# Patient Record
Sex: Male | Born: 1945 | Race: White | Hispanic: No | Marital: Married | Smoking: Former smoker
Health system: Southern US, Community
[De-identification: ages and names within clinical notes are randomized; demographics above are authoritative.]

## PROBLEM LIST (undated history)

## (undated) DIAGNOSIS — F419 Anxiety disorder, unspecified: Secondary | ICD-10-CM

## (undated) DIAGNOSIS — E119 Type 2 diabetes mellitus without complications: Secondary | ICD-10-CM

## (undated) DIAGNOSIS — L570 Actinic keratosis: Secondary | ICD-10-CM

## (undated) DIAGNOSIS — H409 Unspecified glaucoma: Secondary | ICD-10-CM

## (undated) DIAGNOSIS — I10 Essential (primary) hypertension: Secondary | ICD-10-CM

## (undated) DIAGNOSIS — F40240 Claustrophobia: Secondary | ICD-10-CM

## (undated) DIAGNOSIS — H269 Unspecified cataract: Secondary | ICD-10-CM

## (undated) DIAGNOSIS — E7800 Pure hypercholesterolemia, unspecified: Secondary | ICD-10-CM

## (undated) DIAGNOSIS — K219 Gastro-esophageal reflux disease without esophagitis: Secondary | ICD-10-CM

## (undated) DIAGNOSIS — C801 Malignant (primary) neoplasm, unspecified: Secondary | ICD-10-CM

## (undated) HISTORY — DX: Unspecified glaucoma: H40.9

## (undated) HISTORY — PX: CHOLECYSTECTOMY: SHX55

## (undated) HISTORY — DX: Actinic keratosis: L57.0

## (undated) HISTORY — DX: Unspecified cataract: H26.9

## (undated) HISTORY — DX: Malignant (primary) neoplasm, unspecified: C80.1

---

## 2004-12-28 ENCOUNTER — Ambulatory Visit: Payer: Self-pay | Admitting: Gastroenterology

## 2013-06-05 ENCOUNTER — Emergency Department: Payer: Self-pay | Admitting: Emergency Medicine

## 2013-06-05 LAB — URINALYSIS, COMPLETE
Bilirubin,UR: NEGATIVE
Blood: NEGATIVE
Glucose,UR: >=500 mg/dL (ref 0–75)
Nitrite: NEGATIVE
Ph: 5 (ref 4.5–8.0)
Protein: NEGATIVE
RBC,UR: 1 /HPF (ref 0–5)
WBC UR: 2 /HPF (ref 0–5)

## 2013-06-05 LAB — COMPREHENSIVE METABOLIC PANEL
Albumin: 4.3 g/dL (ref 3.4–5.0)
Anion Gap: 4 — ABNORMAL LOW (ref 7–16)
BUN: 20 mg/dL — ABNORMAL HIGH (ref 7–18)
Bilirubin,Total: 0.5 mg/dL (ref 0.2–1.0)
Calcium, Total: 9.5 mg/dL (ref 8.5–10.1)
Osmolality: 286 (ref 275–301)
SGOT(AST): 23 U/L (ref 15–37)
SGPT (ALT): 33 U/L (ref 12–78)
Sodium: 135 mmol/L — ABNORMAL LOW (ref 136–145)
Total Protein: 7.9 g/dL (ref 6.4–8.2)

## 2013-06-05 LAB — CK TOTAL AND CKMB (NOT AT ARMC)
CK, Total: 78 U/L (ref 35–232)
CK-MB: 1.5 ng/mL (ref 0.5–3.6)

## 2013-06-05 LAB — CBC
HCT: 44.5 % (ref 40.0–52.0)
HGB: 15.4 g/dL (ref 13.0–18.0)
MCH: 30.3 pg (ref 26.0–34.0)
MCHC: 34.7 g/dL (ref 32.0–36.0)
MCV: 87 fL (ref 80–100)
Platelet: 254 10*3/uL (ref 150–440)
RBC: 5.09 10*6/uL (ref 4.40–5.90)
RDW: 13 % (ref 11.5–14.5)
WBC: 12.5 10*3/uL — ABNORMAL HIGH (ref 3.8–10.6)

## 2013-06-05 LAB — TROPONIN I: Troponin-I: <0.02 ng/mL

## 2013-07-06 ENCOUNTER — Telehealth (INDEPENDENT_AMBULATORY_CARE_PROVIDER_SITE_OTHER): Payer: Self-pay

## 2013-07-06 ENCOUNTER — Ambulatory Visit (INDEPENDENT_AMBULATORY_CARE_PROVIDER_SITE_OTHER): Payer: Self-pay | Admitting: General Surgery

## 2013-07-06 NOTE — Telephone Encounter (Signed)
Tried to call and see where the pt is.  His appointment was today at 11 am.  There was no answer on either phone.

## 2013-09-09 DIAGNOSIS — M25569 Pain in unspecified knee: Secondary | ICD-10-CM | POA: Insufficient documentation

## 2013-09-09 DIAGNOSIS — M1711 Unilateral primary osteoarthritis, right knee: Secondary | ICD-10-CM | POA: Insufficient documentation

## 2013-09-24 LAB — CBC
HCT: 47.9 % (ref 40.0–52.0)
HGB: 16.5 g/dL (ref 13.0–18.0)
Platelet: 221 10*3/uL (ref 150–440)
RDW: 13.1 % (ref 11.5–14.5)
WBC: 19.8 10*3/uL — ABNORMAL HIGH (ref 3.8–10.6)

## 2013-09-24 LAB — URINALYSIS, COMPLETE
Bacteria: NONE SEEN
Glucose,UR: >=500 mg/dL (ref 0–75)
Leukocyte Esterase: NEGATIVE
Nitrite: NEGATIVE
Ph: 7 (ref 4.5–8.0)
Protein: NEGATIVE
RBC,UR: 1 /HPF (ref 0–5)
Specific Gravity: 1.026 (ref 1.003–1.030)
WBC UR: <1 /HPF (ref 0–5)

## 2013-09-24 LAB — COMPREHENSIVE METABOLIC PANEL
Albumin: 4.7 g/dL (ref 3.4–5.0)
Alkaline Phosphatase: 87 U/L (ref 50–136)
Anion Gap: 7 (ref 7–16)
BUN: 12 mg/dL (ref 7–18)
Chloride: 101 mmol/L (ref 98–107)
SGPT (ALT): 32 U/L (ref 12–78)
Total Protein: 8.3 g/dL — ABNORMAL HIGH (ref 6.4–8.2)

## 2013-09-25 ENCOUNTER — Inpatient Hospital Stay: Payer: Self-pay | Admitting: Surgery

## 2013-09-25 LAB — HEPATIC FUNCTION PANEL A (ARMC)
Albumin: 3.5 g/dL (ref 3.4–5.0)
Bilirubin, Direct: 0.3 mg/dL — ABNORMAL HIGH (ref 0.00–0.20)
SGOT(AST): 29 U/L (ref 15–37)
SGPT (ALT): 30 U/L (ref 12–78)

## 2013-09-25 LAB — BASIC METABOLIC PANEL
Anion Gap: 6 — ABNORMAL LOW (ref 7–16)
Chloride: 102 mmol/L (ref 98–107)
Co2: 29 mmol/L (ref 21–32)
EGFR (Non-African Amer.): >60
Glucose: 178 mg/dL — ABNORMAL HIGH (ref 65–99)
Osmolality: 278 (ref 275–301)
Potassium: 3.3 mmol/L — ABNORMAL LOW (ref 3.5–5.1)

## 2013-09-25 LAB — CBC WITH DIFFERENTIAL/PLATELET
Eosinophil %: 0 %
HCT: 42.4 % (ref 40.0–52.0)
Lymphocyte #: 0.6 10*3/uL — ABNORMAL LOW (ref 1.0–3.6)
MCH: 30.5 pg (ref 26.0–34.0)
MCHC: 34.4 g/dL (ref 32.0–36.0)
Monocyte #: 1.2 x10 3/mm — ABNORMAL HIGH (ref 0.2–1.0)
Neutrophil #: 15.4 10*3/uL — ABNORMAL HIGH (ref 1.4–6.5)
Neutrophil %: 89 %
Platelet: 188 10*3/uL (ref 150–440)
RBC: 4.77 10*6/uL (ref 4.40–5.90)
RDW: 13.2 % (ref 11.5–14.5)

## 2013-09-26 LAB — CBC WITH DIFFERENTIAL/PLATELET
Basophil #: 0 10*3/uL (ref 0.0–0.1)
Basophil %: 0.2 %
Eosinophil %: 0.1 %
HCT: 40.3 % (ref 40.0–52.0)
HGB: 13.8 g/dL (ref 13.0–18.0)
Lymphocyte #: 0.6 10*3/uL — ABNORMAL LOW (ref 1.0–3.6)
MCH: 30.5 pg (ref 26.0–34.0)
MCHC: 34.3 g/dL (ref 32.0–36.0)
MCV: 89 fL (ref 80–100)
Monocyte %: 6.4 %
Neutrophil %: 89.9 %
Platelet: 164 10*3/uL (ref 150–440)
RBC: 4.53 10*6/uL (ref 4.40–5.90)
WBC: 17.2 10*3/uL — ABNORMAL HIGH (ref 3.8–10.6)

## 2013-09-27 LAB — CBC WITH DIFFERENTIAL/PLATELET
Basophil #: 0 10*3/uL (ref 0.0–0.1)
Basophil %: 0.3 %
Eosinophil #: 0 10*3/uL (ref 0.0–0.7)
Eosinophil %: 0 %
HCT: 39.2 % — ABNORMAL LOW (ref 40.0–52.0)
Lymphocyte %: 1.9 %
MCH: 30.4 pg (ref 26.0–34.0)
MCV: 89 fL (ref 80–100)
Monocyte %: 3.5 %
Neutrophil #: 14.1 10*3/uL — ABNORMAL HIGH (ref 1.4–6.5)
Neutrophil %: 94.3 %
Platelet: 186 10*3/uL (ref 150–440)
RDW: 13.2 % (ref 11.5–14.5)

## 2013-09-27 LAB — BASIC METABOLIC PANEL
Anion Gap: 4 — ABNORMAL LOW (ref 7–16)
BUN: 10 mg/dL (ref 7–18)
Co2: 27 mmol/L (ref 21–32)
EGFR (African American): >60
Osmolality: 277 (ref 275–301)
Sodium: 134 mmol/L — ABNORMAL LOW (ref 136–145)

## 2013-09-27 LAB — HEPATIC FUNCTION PANEL A (ARMC)
Albumin: 2.7 g/dL — ABNORMAL LOW (ref 3.4–5.0)
Alkaline Phosphatase: 83 U/L (ref 50–136)
Bilirubin, Direct: 0.3 mg/dL — ABNORMAL HIGH (ref 0.00–0.20)
SGOT(AST): 29 U/L (ref 15–37)
SGPT (ALT): 28 U/L (ref 12–78)
Total Protein: 6.6 g/dL (ref 6.4–8.2)

## 2013-09-28 LAB — COMPREHENSIVE METABOLIC PANEL
Albumin: 2.9 g/dL — ABNORMAL LOW (ref 3.4–5.0)
Alkaline Phosphatase: 85 U/L (ref 50–136)
Anion Gap: 0 — ABNORMAL LOW (ref 7–16)
BUN: 11 mg/dL (ref 7–18)
Bilirubin,Total: 0.6 mg/dL (ref 0.2–1.0)
Calcium, Total: 8.9 mg/dL (ref 8.5–10.1)
Co2: 31 mmol/L (ref 21–32)
Creatinine: 0.89 mg/dL (ref 0.60–1.30)
EGFR (African American): >60
EGFR (Non-African Amer.): >60
Glucose: 191 mg/dL — ABNORMAL HIGH (ref 65–99)
Potassium: 4.4 mmol/L (ref 3.5–5.1)
SGOT(AST): 17 U/L (ref 15–37)
SGPT (ALT): 26 U/L (ref 12–78)
Sodium: 132 mmol/L — ABNORMAL LOW (ref 136–145)
Total Protein: 7.1 g/dL (ref 6.4–8.2)

## 2013-09-28 LAB — CBC WITH DIFFERENTIAL/PLATELET
Basophil #: 0.1 10*3/uL (ref 0.0–0.1)
Eosinophil #: 0 10*3/uL (ref 0.0–0.7)
HCT: 37.5 % — ABNORMAL LOW (ref 40.0–52.0)
MCH: 31.2 pg (ref 26.0–34.0)
MCV: 90 fL (ref 80–100)
Neutrophil #: 14.6 10*3/uL — ABNORMAL HIGH (ref 1.4–6.5)
Neutrophil %: 90.1 %
Platelet: 291 10*3/uL (ref 150–440)
RBC: 4.18 10*6/uL — ABNORMAL LOW (ref 4.40–5.90)
RDW: 13.1 % (ref 11.5–14.5)
WBC: 16.3 10*3/uL — ABNORMAL HIGH (ref 3.8–10.6)

## 2013-09-29 LAB — COMPREHENSIVE METABOLIC PANEL
Bilirubin,Total: 0.7 mg/dL (ref 0.2–1.0)
Calcium, Total: 8.7 mg/dL (ref 8.5–10.1)
Chloride: 101 mmol/L (ref 98–107)
Co2: 29 mmol/L (ref 21–32)
Creatinine: 1.15 mg/dL (ref 0.60–1.30)
Glucose: 258 mg/dL — ABNORMAL HIGH (ref 65–99)
Osmolality: 279 (ref 275–301)
Potassium: 3.8 mmol/L (ref 3.5–5.1)
SGPT (ALT): 25 U/L (ref 12–78)
Sodium: 135 mmol/L — ABNORMAL LOW (ref 136–145)

## 2013-09-29 LAB — CULTURE, BLOOD (SINGLE)

## 2013-09-29 LAB — CBC WITH DIFFERENTIAL/PLATELET
Basophil #: 0 10*3/uL (ref 0.0–0.1)
Basophil %: 0.1 %
Eosinophil #: 0 10*3/uL (ref 0.0–0.7)
Eosinophil %: 0 %
Lymphocyte %: 3.3 %
MCH: 30.6 pg (ref 26.0–34.0)
MCHC: 34 g/dL (ref 32.0–36.0)
MCV: 90 fL (ref 80–100)
Monocyte %: 2.8 %
Neutrophil %: 93.8 %
Platelet: 272 10*3/uL (ref 150–440)
RDW: 13.1 % (ref 11.5–14.5)

## 2013-10-01 LAB — CBC WITH DIFFERENTIAL/PLATELET
Basophil #: 0.1 10*3/uL (ref 0.0–0.1)
Eosinophil #: 0.1 10*3/uL (ref 0.0–0.7)
Lymphocyte #: 1.6 10*3/uL (ref 1.0–3.6)
Lymphocyte %: 13.2 %
MCV: 89 fL (ref 80–100)
Monocyte %: 7.5 %
Neutrophil #: 9.8 10*3/uL — ABNORMAL HIGH (ref 1.4–6.5)
Neutrophil %: 78.3 %
Platelet: 302 10*3/uL (ref 150–440)
WBC: 12.5 10*3/uL — ABNORMAL HIGH (ref 3.8–10.6)

## 2013-10-01 LAB — HEPATIC FUNCTION PANEL A (ARMC)
Alkaline Phosphatase: 76 U/L (ref 50–136)
SGOT(AST): 19 U/L (ref 15–37)
SGPT (ALT): 29 U/L (ref 12–78)
Total Protein: 5.7 g/dL — ABNORMAL LOW (ref 6.4–8.2)

## 2013-12-22 ENCOUNTER — Ambulatory Visit: Payer: Self-pay | Admitting: Gastroenterology

## 2014-08-08 DIAGNOSIS — E785 Hyperlipidemia, unspecified: Secondary | ICD-10-CM | POA: Insufficient documentation

## 2014-08-08 DIAGNOSIS — E119 Type 2 diabetes mellitus without complications: Secondary | ICD-10-CM | POA: Insufficient documentation

## 2014-08-08 DIAGNOSIS — I10 Essential (primary) hypertension: Secondary | ICD-10-CM | POA: Insufficient documentation

## 2014-08-08 DIAGNOSIS — K219 Gastro-esophageal reflux disease without esophagitis: Secondary | ICD-10-CM | POA: Insufficient documentation

## 2014-11-15 IMAGING — CT CT ABD-PELV W/ CM
2 of 5 series · 16 of 46 positions shown, 18 images · IV contrast (isovue)
Comparison: 06/05/2013

CLINICAL DATA: Gallbladder pain since 1 a.m..

EXAM:
CT ABDOMEN AND PELVIS WITH CONTRAST
TECHNIQUE: Multidetector CT imaging of the abdomen and pelvis was performed
using the standard protocol following bolus administration of
intravenous contrast.
CONTRAST:  125 mL of Isovue 370

[Series 2: routine abd pel with · axial · 0.78mm/px · z∈[-966,-546]mm · 13 of 96 slices shown, 15 images]
[im 6/96  soft-tissue]
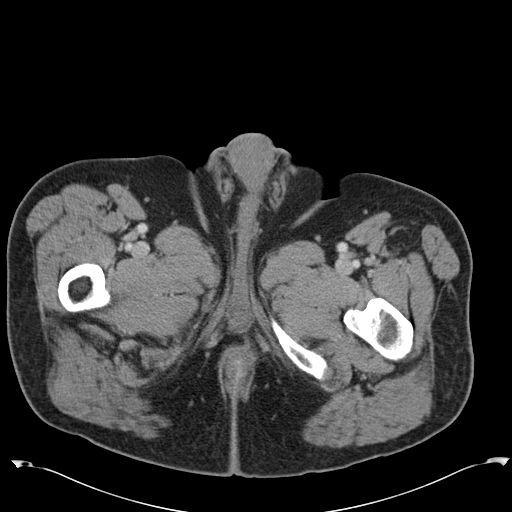
[im 6/96  bone]
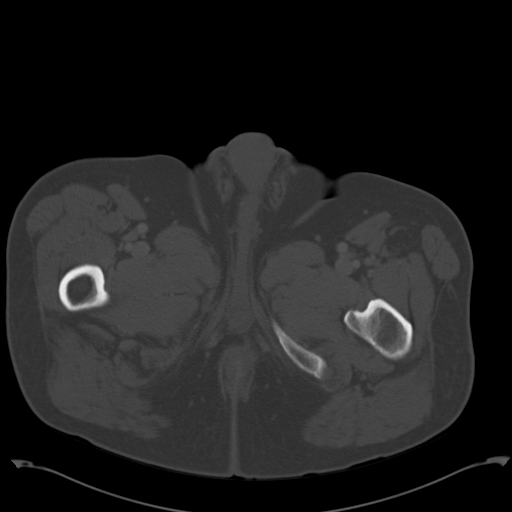
[im 11/96  soft-tissue]
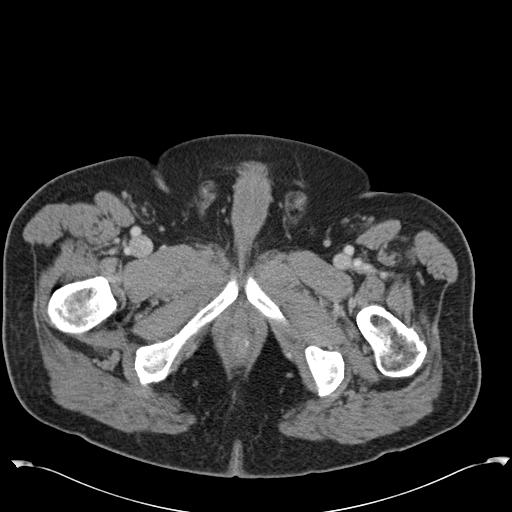
[im 22/96  soft-tissue]
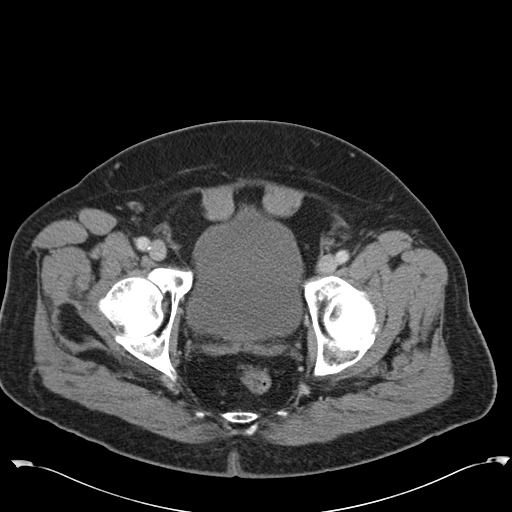
[im 27/96  soft-tissue]
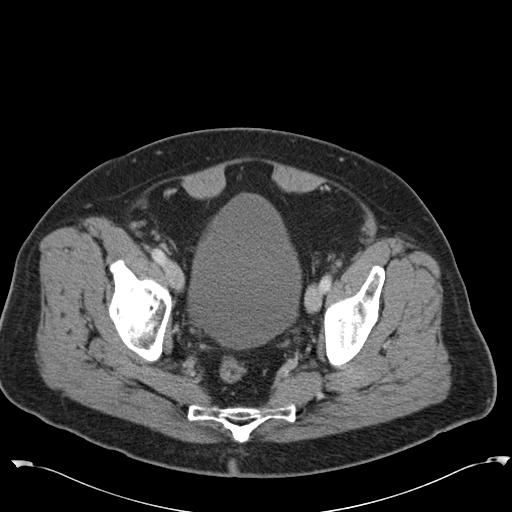
[im 32/96  soft-tissue]
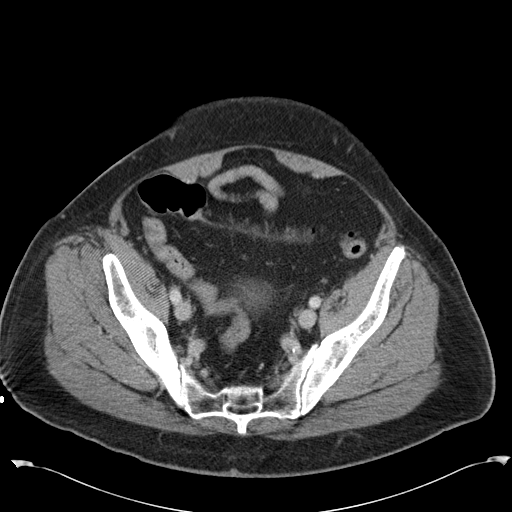
[im 43/96  soft-tissue]
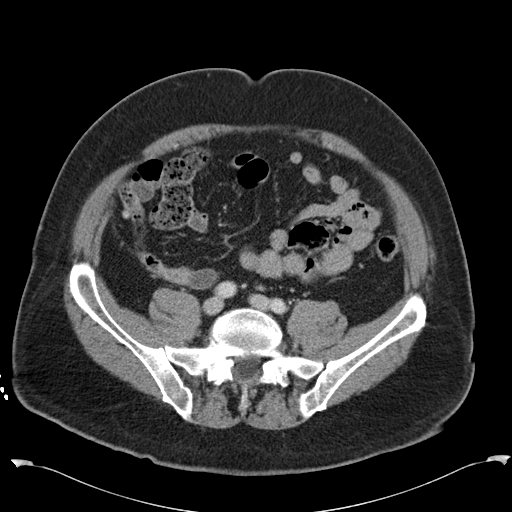
[im 48/96  soft-tissue]
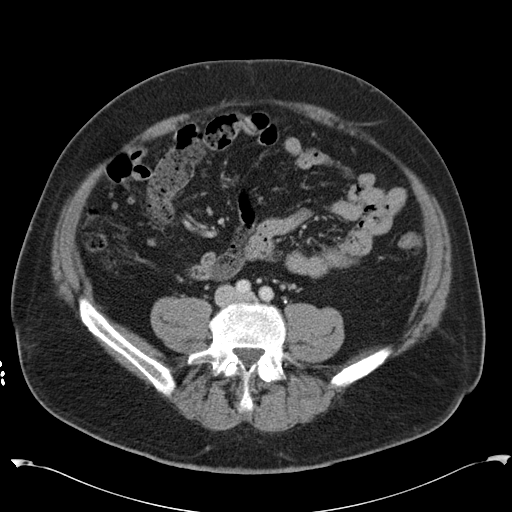
[im 53/96  soft-tissue]
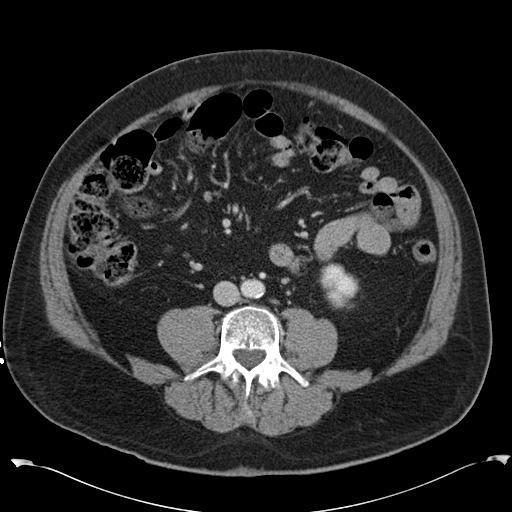
[im 64/96  soft-tissue]
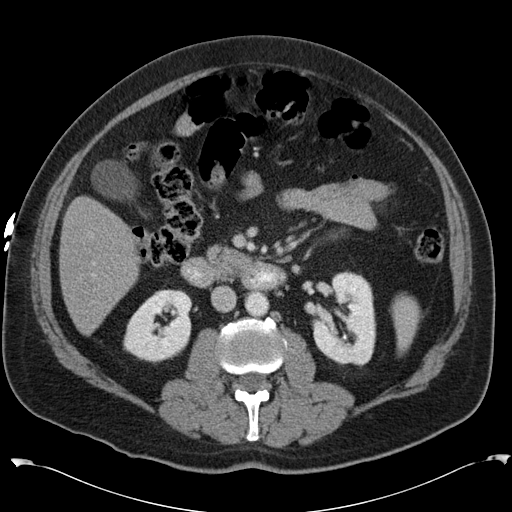
[im 64/96  bone]
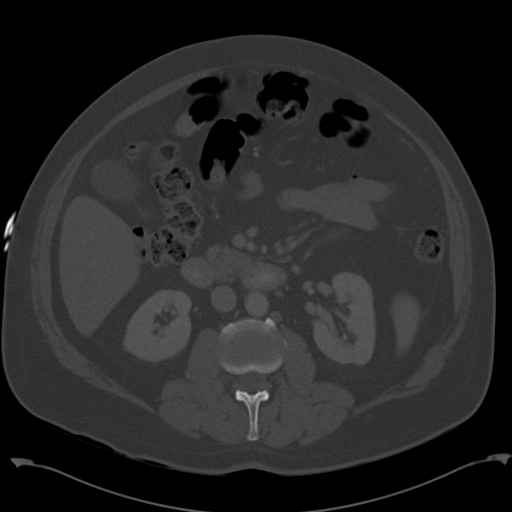
[im 69/96  soft-tissue]
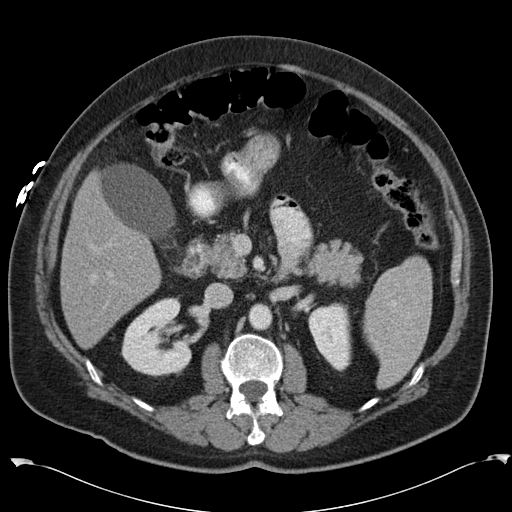
[im 74/96  soft-tissue]
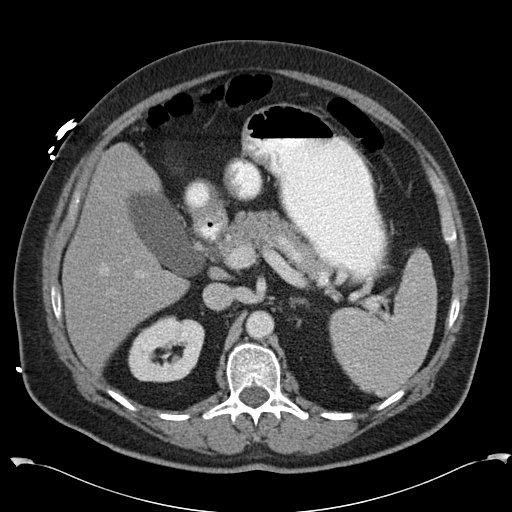
[im 85/96  soft-tissue]
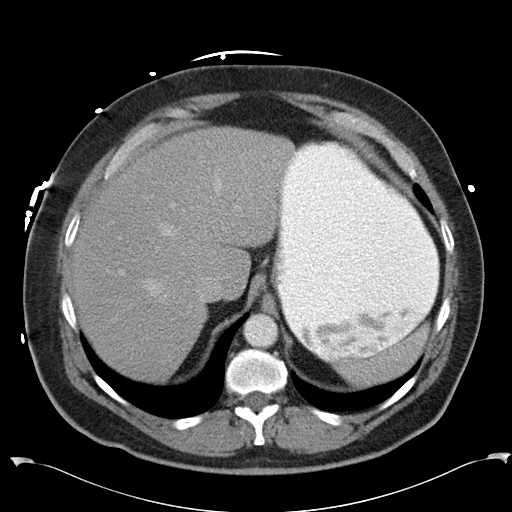
[im 90/96  soft-tissue]
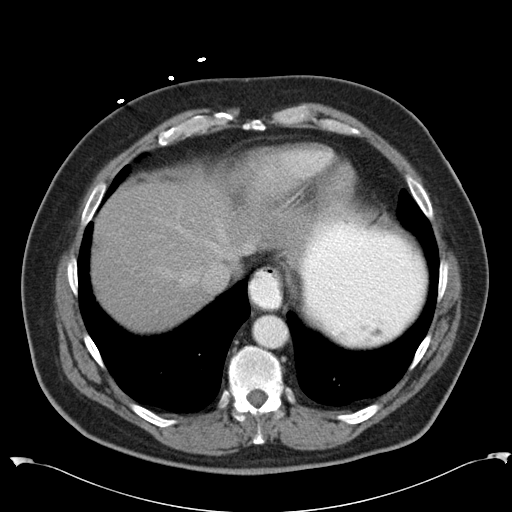

[Series 5: cor routine abd pel with · coronal · 0.73mm/px · 3 of 176 slices shown]
[im 59/176  soft-tissue]
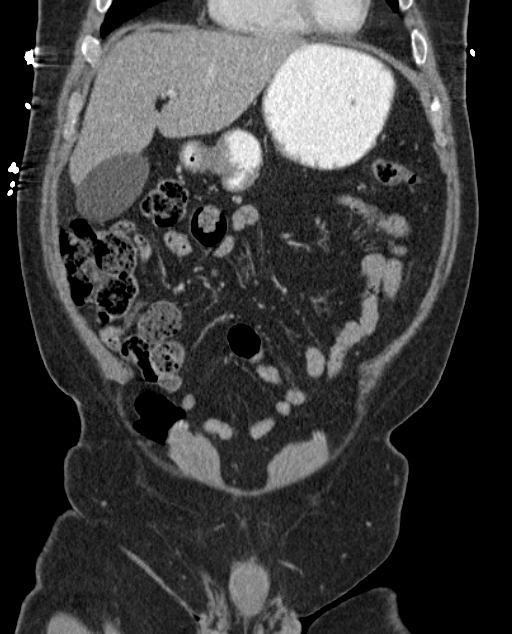
[im 78/176  soft-tissue]
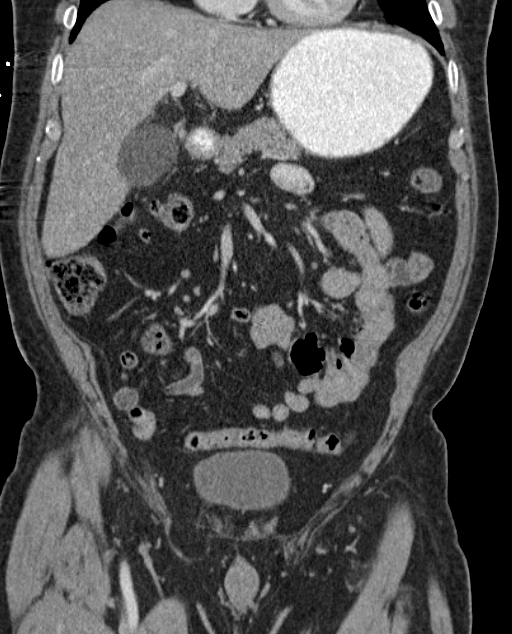
[im 98/176  soft-tissue]
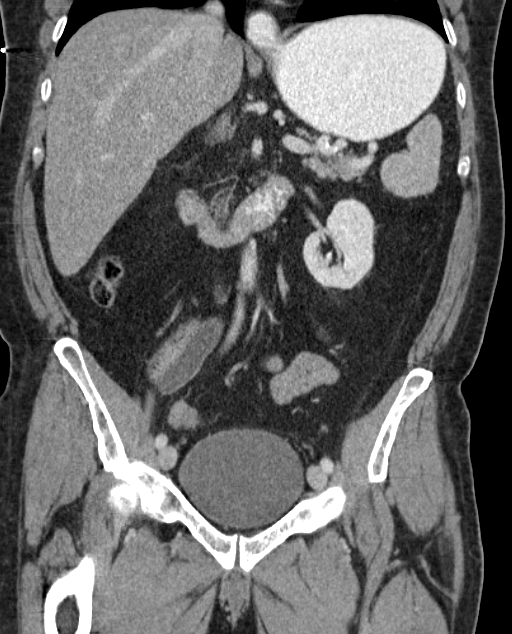

[16 of 46 positions shown; findings below may reference images not displayed]

FINDINGS: Visualized lung bases are clear. No pericardial effusion. Oral
contrast is visualized within a hiatal hernia and the distal
esophagus. Stomach is distended with fluid and oral contrast.

The liver, gallbladder,spleen, pancreas, adrenals, and kidneysare
unremarkable. No pericholecystic fluid or inflammatory changes.
Bladder is normal for the degree of distention. Prostate is
unchanged.

Opacified bowel loops are normal in caliber without evidence of
obstruction. In the left lower abdominal quadrant, there is a 2.6 x
3.1 cm collection of gas and soft tissue density. This is not
identified on the in May 2013 examination. No surrounding
inflammation is identified. This is in close proximity to the
jejunum. There is colonic diverticulosis without evidence of
diverticulitis.Normal appendix is visualized. <No ascites,
pneumoperitoneum, or lymphadenopathy is seen.

Aorta is normal in caliber.

No acute osseous abnormality or destructive osseous lesion.
IMPRESSION: 1. Unremarkable gallbladder without pericholecystic fluid or
inflammatory changes to suggest cholecystitis.
2. 2.6 x 3.1 cm collection of the gas and soft tissue density
adjacent to jejunum in the left lower quadrant. Given the lack of
surrounding inflammatory change, this likely represents a jejunal
diverticulum. However, this was not apparent on the prior
examination. Therefore, clinical correlation for pain in the left
lower quadrant is recommended to assess for possible contained bowel
perforation.
3. Colonic diverticulosis without evidence of diverticulitis.
4. Oral contrast within the distal esophagus, indicating reflux.
5. Hiatal hernia and distension of the stomach with oral contrast
and food material.
These results will be called to the ordering clinician or
representative by the Radiologist Assistant, and communication
documented in the PACS Dashboard.

## 2014-11-18 HISTORY — PX: COLONOSCOPY: SHX174

## 2015-01-23 ENCOUNTER — Ambulatory Visit: Payer: Self-pay | Admitting: Unknown Physician Specialty

## 2015-03-10 NOTE — Consult Note (Signed)
Brief Consult Note: Diagnosis: aspiraation pneumonia.   Patient was seen by consultant.   Consult note dictated.   Orders entered.   Comments: thanks for the opportunity to participate- will continue to follow.  Electronic Signatures: Vaughan Basta (MD)  (Signed 11-Nov-14 15:03)  Authored: Brief Consult Note   Last Updated: 11-Nov-14 15:03 by Vaughan Basta (MD)

## 2015-03-10 NOTE — Consult Note (Signed)
PATIENT NAME:  Sean Garner, Sean Garner MR#:  [id number] DATE OF BIRTH:  [birth year]  DATE OF CONSULTATION:  09/28/2013  REQUESTING PHYSICIAN: Dr. Sherri Rad  CONSULTING GASTROENTEROLOGIST: Dr. Lucilla Lame.  PRIMARY CARE PHYSICIAN:  [place].   REASON FOR CONSULTATION: Post cholecystectomy bile leak.   HISTORY OF PRESENT ILLNESS: Sean Garner is a 69 year old Caucasian male who was admitted with abdominal pain. 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 However, he continued to have problems, so he underwent a exploratory laparotomy on 09/26/2013 by Dr. Pat Patrick and was found to have acute gangrenous cholecystitis. He says his pain has been better postoperative. He is having some pain at the suture site which he rates 6/10 at worst. He denies any nausea or vomiting. He has not had a bowel movement in the past five days. He is passing flatus. He has lost 19 pounds in the last five months but states he was doing this intentionally due to hyperglycemia. He has had increased bilious output in his JP drain, 450 mL yesterday, 180 mL so far today.   PAST MEDICAL AND SURGICAL HISTORY: Hypercholesterolemia and diabetes mellitus, hypertension, diagnostic laparotomy with cholecystectomy for gangrenous cholecystitis, tonsillectomy.   MEDICATIONS PRIOR TO ADMISSION: Aspirin 81 mg daily, hydrochlorothiazide 25 mg daily. Vytorin 10/20 mg daily.   ALLERGIES: No known drug allergies.   FAMILY HISTORY: There is no known family history of colorectal carcinoma, liver or chronic GI problems.   SOCIAL HISTORY: He quit smoking in 1986 after a 25 pack-year history. He occasionally has a mixed drink once a week. He denies any illicit drug use. He is retired from Engineer, mining. He is married and has one Psychiatrist.   REVIEW OF SYSTEMS: See HPI, otherwise  negative 10 point review of systems.   PHYSICAL EXAMINATION: VITAL SIGNS: Temperature 98, pulse 71, respirations 20,  blood pressure 146/75, oxygen saturation 94%.  GENERAL: He is an obese Caucasian male who is alert, oriented, pleasant, cooperative, in no acute distress. He is accompanied by his wife.  HEENT: Sclerae clear, nonicteric. Conjunctivae pink. Oropharynx pink and moist without any lesions.  NECK: Supple without mass or thyromegaly.  CHEST: Heart regular rate and rhythm with normal S1, S2 without any murmurs, clicks, rubs or gallops.  LUNGS: Clear to auscultation bilaterally.  ABDOMEN: Protuberant. He has a JP drain with 10 mL of bilious drainage. He has dressings that are dry and intact.  ABDOMEN: Soft. He has faint bowel sounds. No rebound, tenderness or guarding.  EXTREMITIES: Without clubbing or edema.  SKIN: Pink, warm and dry without any rash or jaundice.  NEUROLOGIC: Grossly intact.  MUSCULOSKELETAL: Good equal movement and strength bilaterally.  PSYCHIATRIC: He is alert, cooperative, normal mood and affect.   LABORATORY STUDIES: Glucose 191, sodium 132, otherwise normal met-7. Lipase was 74 on 11/07. Albumin 2.9, otherwise normal LFTs. Troponin was negative on 11/07. White blood cell count of 16.3, hemoglobin 13, hematocrit 37.5, platelets 291. Urinalysis on 11/07 showed glucose greater than 500, ketones 1+.  IMPRESSION: Sean Garner is a very pleasant 69 year old Caucasian male who is two days status post laparotomy with cholecystectomy for gangrenous cholecystitis, who has had profuse, increased bilious drainage in his JP drain with suspected bile leak. He will need electroscopic retrograde cholangiopancreatography today with stent placement for bile leak with Dr. Allen Norris. I have discussed risks and benefits and alternatives, which include but are not limited to pancreatitis, bleeding, infection, perforation and drug reaction. He and his  wife agree with the plan. Consent will be obtained. He has been n.p.o.     PLAN: 1. ERCP today with stent placement with Dr. Allen Norris.  2. N.p.o.  3. Continue  supportive care.  We would like to thank you for allowing [place] to participate in the care of Sean Garner.  ____________________________ Andria Meuse, NP klj:sg D: 09/28/2013 10:00:23 ET T: 09/28/2013 10:47:58 ET JOB#: [id number]  cc: Andria Meuse, NP, <Dictator> [facility], [place] SIGNED 10/25/2013 15:28

## 2015-03-10 NOTE — Consult Note (Signed)
Chief Complaint:  Subjective/Chief Complaint Pt denies abdominal pain.  Denies N/V.  Appetite good. Another soft brown BMs in past 24 hrs. Pt saw scant red blood at surgical site last night.  no further bleeding.  JP output scant.   VITAL SIGNS/ANCILLARY NOTES: **Vital Signs.:   14-Nov-14 06:39  Vital Signs Type Routine  Celsius 36.8  Temperature Source oral  Pulse Pulse 66  Respirations Respirations 18  Systolic BP Systolic BP 370  Diastolic BP (mmHg) Diastolic BP (mmHg) 77  Mean BP 101  Pulse Ox % Pulse Ox % 92  Pulse Ox Activity Level  At rest  Oxygen Delivery Room Air/ 21 %  *Intake and Output.:   14-Nov-14 05:50  JP Drain ml     Out:  2.5    Shift 07:00  JP Drain ml     Out:  2.5    Daily 07:00  JP Drain ml     Out:  10   Brief Assessment:  GEN well developed, no acute distress, obese, Wife at bedside.   Cardiac Regular   Respiratory normal resp effort  clear BS  no use of accessory muscles   Gastrointestinal Normal   Gastrointestinal details normal Soft  Nontender  Nondistended  Dressings intact.  JP drain w/ 10cc bilous drainage   EXTR negative cyanosis/clubbing, negative edema   Additional Physical Exam Skin: pink, warm, dry   Lab Results: Hepatic:  14-Nov-14 05:08   Bilirubin, Total 0.6  Bilirubin, Direct 0.2 (Result(s) reported on 01 Oct 2013 at 05:37AM.)  Alkaline Phosphatase 76  SGPT (ALT) 29  SGOT (AST) 19  Total Protein, Serum  5.7  Albumin, Serum  2.4  Routine Hem:  14-Nov-14 05:08   WBC (CBC)  12.5  RBC (CBC)  4.02  Hemoglobin (CBC)  12.3  Hematocrit (CBC)  35.7  Platelet Count (CBC) 302  MCV 89  MCH 30.7  MCHC 34.5  RDW 13.1  Neutrophil % 78.3  Lymphocyte % 13.2  Monocyte % 7.5  Eosinophil % 0.6  Basophil % 0.4  Neutrophil #  9.8  Lymphocyte # 1.6  Monocyte # 0.9  Eosinophil # 0.1  Basophil # 0.1 (Result(s) reported on 01 Oct 2013 at 05:37AM.)   Assessment/Plan:  Assessment/Plan:  Assessment Post-cholecystectomy bile  leak s/p stent placement: Much improved.  JP output scant at this point.  No need for 2nd biliary stent at this point.  Bile Aspiration Pneumonitis: Resolved.  Appreciate Dr Zoila Shutter assistance   Plan 1) Continue supportive measures 2) resume diet & lovenox 3) monitor JP output 4) NPO after MN Will sign off, please call if you have any questions or concerns   Electronic Signatures: Andria Meuse (NP)  (Signed 14-Nov-14 09:00)  Authored: Chief Complaint, VITAL SIGNS/ANCILLARY NOTES, Brief Assessment, Lab Results, Assessment/Plan   Last Updated: 14-Nov-14 09:00 by Andria Meuse (NP)

## 2015-03-10 NOTE — Consult Note (Signed)
Chief Complaint:  Subjective/Chief Complaint Pt denies abdominal pain.  Denies N/V.  Appetite good.    No SOB.  2 soft browm BMs in past 24 hrs.   VITAL SIGNS/ANCILLARY NOTES: **Vital Signs.:   13-Nov-14 10:55  Temperature Temperature (F) 98.4  Celsius 36.8  Temperature Source oral  Pulse Pulse 75  Respirations Respirations 20  Systolic BP Systolic BP 902  Diastolic BP (mmHg) Diastolic BP (mmHg) 74  Mean BP 99  Pulse Ox % Pulse Ox % 92  Pulse Ox Activity Level  At rest  Oxygen Delivery Room Air/ 21 %   Brief Assessment:  GEN well developed, no acute distress, obese, Aunt at bedside.   Cardiac Regular   Respiratory normal resp effort  clear BS  no use of accessory muscles   Gastrointestinal Normal   Gastrointestinal details normal Soft  Nontender  Nondistended  Dressings intact.  JP drain w/ 10cc bilous drainage   EXTR negative cyanosis/clubbing, negative edema   Additional Physical Exam Skin: pink, warm, dry   Assessment/Plan:  Assessment/Plan:  Assessment Post-cholecystectomy bile leak s/p stent placement: Improving.  JP output decreasing but not completely diminished.  I discussed at great length this situation with Dr Allen Norris, Dr Marina Gravel, patient & patient's step-daughter.  Plan is to continue to monitor JP drainage today.  Pt may benefit from a 2nd biliary stent if leak still present tomorrow.  Pt will be made NPO after MN & lovenox placed on hold in anticipation.  I did discuss pt & family's concern about aspiration risks with sedation & explained that pt would need to be intubated for ERCP/stent if needed tomorrow.  I had a long discussion (15 minutes via telephone) with patient's step daughter & multiple questions were answered.  Family voiced cpncerns about leukocytosis & this was explained as pt's WBC is improving.  Explained it may remain elevated secondary to steroids or stress response as well.  Everyone is in agreement with plans & pt will be re-evaluated in the morning  or sooner if the need arises.   Bile Aspiration Pneumonitis: Resolving.  Appreciate Dr Zoila Shutter assistance   Plan 1) Advance diet as tolerated 2) continue supportive measures 3) monitor JP output 4) NPO after MN 5) lovenox on hold for possible procedure tomorrow 6) ERCP with secondary stent placement by Dr Allen Norris IF NEEDED tomorrow 7) CBC, LFTS in AM Please call if you have any questions or concerns   Electronic Signatures: Andria Meuse (NP)  (Signed [phone] 11:33)  Authored: Chief Complaint, VITAL SIGNS/ANCILLARY NOTES, Brief Assessment, Assessment/Plan   Last Updated: 13-Nov-14 11:33 by Andria Meuse (NP)

## 2015-03-10 NOTE — Op Note (Signed)
PATIENT NAME:  Sean Garner, Sean Garner MR#:  [id number] DATE OF BIRTH:  01-25-1946  DATE OF PROCEDURE:  09/26/2013  PREOPERATIVE DIAGNOSIS: Jejunal diverticulitis.   POSTOPERATIVE DIAGNOSIS: Acute gangrenous cholecystitis.   SURGERY: Exploratory laparotomy with cholecystectomy.   SURGEON: Micheline Maze, M.D.   ANESTHESIA: General.   OPERATIVE PROCEDURE: With the patient in the supine position after the induction of appropriate general anesthesia, the patient's abdomen was prepped with Betadine and draped with sterile towels. An alcohol wipe and Betadine impregnated Steri-Drape were utilized. A midline incision was made just around the umbilicus and carried down through the subcutaneous tissue with Bovie electrocautery. Midline fascia was identified and opened the length of the skin incision, as was the peritoneum. There was a small amount of what appeared to be bile-stained fluid noted. There was not any obvious purulence. The small bowel was identified and run from the ligament of Treitz to the ileocecal valve. There were multiple small jejunal diverticulum, but there did not appear to be any evidence of diverticulitis. The bowel was run from the ileocecal valve back to the ligament of Treitz, and again no abnormalities were identified. I palpated the pelvis, and he did have some diverticulosis but no evidence of any diverticulitis. On manual palpation, the upper abdomen revealed a distended, tense, inflamed gallbladder. The incision was extended superiorly through the subxiphoid area and carried down through the subcutaneous tissue using Bovie electrocautery. Midline fascia was identified and opened the length of the skin incision in this area. Multiple retractors were placed to provide better exposure. The gallbladder was acutely distended and gangrenous in areas. The dome was opened as I could not identify anything in the porta hepatis. Bile was all aspirated and was clear with evidence of complete biliary  obstruction. The gallbladder was grasped and taken down from the top using Bovie electrocautery. It was partially intrahepatic and using finger fracture technique, the gallbladder was separated from the bed in the liver. The cystic artery was identified and clipped. It was divided. Tedious dissection was required to expose what appeared to be a necrotic cystic duct. It was doubly clipped and divided. The gallbladder was lifted out of the abdominal cavity. The area was copiously suction irrigated. Attention was then turned to the liver bed which was bleeding profusely. Using manual pressure, Surgicel and Avitene, the bleeding was controlled. Total blood loss approximately 300 mL. The area was copiously irrigated. A drain was placed through a separate stab wound, placed in the bed of the liver using a 19 [place] Blake drain. It was secured with 3-0 nylon. Using a visceral retractor, the midline fascia was closed with running #1 looped PDS from each end, tying in the middle, burying the knot. Penrose drain was placed in the depths of the incision since there was such a contaminated field. The skin was then clipped using standard skin clips. Sterile dressings were applied. The patient was returned to the recovery room, having tolerated the procedure well. Sponge, instrument and needle counts were correct x 2 in the operating room.   ____________________________ Rodena Goldmann III, MD rle:gb D: 09/26/2013 21:46:32 ET T: 09/26/2013 22:13:25 ET JOB#: [id number]  cc: Rodena Goldmann III, MD, <Dictator> Rodena Goldmann MD ELECTRONICALLY SIGNED 10/07/2013 19:03

## 2015-03-10 NOTE — H&P (Signed)
   Subjective/Chief Complaint Abdominal pain   History of Present Illness Sean Garner is a pleasant 69 yo M who presents with 1 day of periumbilical and more left sided abdominal pain.  He says that it began acutely at 1 am today.  He initially thought that it was his gallbladder.  He says that he had similar pain in july.  This is much worse.  He had pericholecystic fluid at that time.  Does have subjective chills.  Nausea post CT.  Having regular BM.   Past History Hypercholesterolemia Hypertension   Past Med/Surgical Hx:  Hypercholesterolemia:   Hypertension:   ALLERGIES:  No Known Allergies:   Family and Social History:  Family History Negative   Social History negative tobacco, negative ETOH, negative Illicit drugs   Place of Living Home   Review of Systems:  Subjective/Chief Complaint Epigastric/Lsided pain   Fever/Chills No   Cough No   Sputum No   Abdominal Pain Yes   Diarrhea No   Constipation No   Nausea/Vomiting Yes   SOB/DOE No   Chest Pain No   Dysuria No   Tolerating PT Yes   Tolerating Diet Nauseated   Physical Exam:  GEN well developed, well nourished, no acute distress   HEENT pink conjunctivae, PERRL, hearing intact to voice   RESP normal resp effort  clear BS  no use of accessory muscles   CARD regular rate  no murmur  no thrills  No LE edema  no JVD  no Rub   ABD positive tenderness  denies Flank Tenderness  no hernia  soft  distended  normal BS   SKIN normal to palpation, No rashes, No ulcers, skin turgor good   NEURO cranial nerves intact, negative rigidity, negative tremor   PSYCH alert, A+O to time, place, person, good insight    Assessment/Admission Diagnosis Sean Garner is a pleasant 69 yo M with a history of epigastric pain who presents with acute onset epigastric and left abdominal pain.  + leukocytosis.  Not currently very tender.  CT scan concerned for perijejunal abscess.  Concern for small bowel diverticulitis vs meckels.    Plan Will discuss with Dr. Pat Patrick, who patient has requested, but will plan on exploratory laparotomy and probable small bowel resection.   Electronic Signatures: Floyde Parkins (MD)  (Signed [phone] 17:07)  Authored: CHIEF COMPLAINT and HISTORY, PAST MEDICAL/SURGIAL HISTORY, ALLERGIES, FAMILY AND SOCIAL HISTORY, REVIEW OF SYSTEMS, PHYSICAL EXAM, ASSESSMENT AND PLAN   Last Updated: 07-Nov-14 17:07 by Floyde Parkins (MD)

## 2015-03-10 NOTE — Discharge Summary (Signed)
PATIENT NAME:  Sean Garner, Sean Garner MR#:  [id number] DATE OF BIRTH:  1946-07-07  DATE OF ADMISSION:  09/25/2013 DATE OF DISCHARGE:  10/02/2013  FINAL DIAGNOSES: 1.  Acute gangrenous cholecystitis.  2.  Cystic duct leak.  3.  Jejunal diverticulosis.  4.  Hypertension.  5.  Hypercholesterolemia.   PRINCIPAL PROCEDURES:  1.  CT scan of the abdomen and pelvis.  2.  Open cholecystectomy.  3.  ERCP with stent placement.  4.  Gastroenterology evaluation.  5.  Pulmonary medicine evaluation.  6.  Internal medicine evaluation.  7.  Multiple chest x-rays.   HOSPITAL COURSE SUMMARY: The patient was admitted with abdominal pain, presumptive diagnosis of jejunal diverticulitis on the 7th of November. The patient's family requested Dr. Pat Patrick to evaluate him. Upon Dr. Rolin Barry evaluation, the patient had no pain, was feeling better. On hospital day #2, patient's white count remained elevated. His abdomen remained nontender. On hospital day #2, in the evening, the patient was found to have intermittent fevers. Intravenous antibiotics were continued. He had mild left lower quadrant tenderness. No rebound and no guarding. On the 9th, the patient had a temperature of 103 the previous evening. The patient was not feeling well. Abdomen was tender but soft. The patient requested Dr. Pat Patrick. He was taken to the Operating Room for laparotomy on the 9th of November in the evening. Acute gangrenous cholecystitis was performed through a midline incision and a drain was left in place. The patient did have gangrenous cholecystitis.   On postoperative day #1, the patient was found to have a bile leak. Gastroenterology was consulted. An ERCP was subsequently performed by Dr. Allen Norris on the same day. Cystic duct leak was encountered. During the procedure, the patient had what looked like an aspiration event. Postoperatively, he was seen by Dr. Mortimer Fries and medicine as well. He was placed on steroids and intravenous antibiotics. He was also placed on  breathing treatments. The patient really had no significant repercussions from his aspiration event and was able to be weaned off of his oxygen rather quickly. The patient continued to improve. JP output was still a little bilious but markedly diminished. His diet was able to advanced from clears to regular. He tolerated this well. On post ERCP day #2, patient still had some bile in his drain with a plan for the following day for a repeat ERCP. Drainage remained scant, ERCP was canceled. The patient continued to improve. He was placed on a prednisone taper. At this point, I switched him over to oral antibiotics of Levaquin. On the 15th, patient was very stable. Abdomen was soft, Penrose drain being removed. He was stable and improved for discharge, leaving the drain in place.  FOLLOWUP:  I will see him in my office next week for drain and staple removal.   DISCHARGE MEDICATIONS: Levaquin 500 mg by mouth once a day for 5 days, acetaminophen/hydrocodone 5/325 one tab every 6 hours as needed for pain, aspirin 81 mg by mouth once a day, Vytorin 10/20 mg tablet 1 tab every day, hydrochlorothiazide 25 mg by mouth once a day.   Call the office with any questions or concerns.    ____________________________ Jeannette How Marina Gravel, MD mab:cs D: 10/03/2013 17:09:18 ET T: 10/03/2013 20:09:24 ET JOB#: [id number]  cc: Elta [place] A. Marina Gravel, MD, <Dictator> Mili Piltz A Tyshana Nishida MD ELECTRONICALLY SIGNED 10/04/2013 21:27

## 2015-03-10 NOTE — Consult Note (Signed)
PATIENT NAME:  Sean Garner, Sean Garner MR#:  [id number] DATE OF BIRTH:  Sep 05, 1946  DATE OF CONSULTATION:  09/28/2013  REFERRING PHYSICIAN:  Marlyce Huge, MD CONSULTING PHYSICIAN:  Ceasar Lund. Anselm Jungling, MD  REASON FOR CONSULTATION: Pneumonia.  HISTORY OF PRESENT ILLNESS: The patient is a 69 year old male who was admitted to the hospital with pain, was found to have jejunal diverticulitis. Initially was on medical management, but on 9th of November exploratory laparotomy was done and found to have gangrenous cholecystitis so cholecystectomy was done. Also had large bilious vomit during this period and oropharynx was suctioned aggressively and after that chest x-ray shows left lower lobe opacity. Now the patient is alert, some respiratory distress, but no fever, nausea or vomiting, and denies any complaints. Hospitalist consult is called in for aspiration pneumonia management. Complains of some wheezing and feeling congestion in his chest, by patient.   REVIEW OF SYSTEMS: CONSTITUTIONAL: Negative for fever, fatigue, weakness or weight loss.  EYES: No blurring, double vision, discharge or redness.  EARS, NOSE, THROAT: No tinnitus, ear pain or hearing loss.  RESPIRATORY: Has some wheezing and congestion feeling and mild shortness of breath.  CARDIOVASCULAR: No chest pain, orthopnea, palpitations or edema.  GASTROINTESTINAL: No nausea, vomiting, diarrhea or abdominal pain.  GENITOURINARY: No dysuria, hematuria or increased frequency.  GASTROINTESTINAL: Denies any nausea, vomiting or diarrhea. Mild pain in the site of surgery.  SKIN: No rashes.  LEGS: No swelling or pain. NEUROLOGIC: Denies any weakness, numbness, tremors or vertigo.  PSYCHIATRIC: No bipolar disorder or depression.   PAST MEDICAL HISTORY:  1.  Hypercholesterolemia.  2.  Diabetes.  3.  Hypertension.  4.  Diagnostic laparotomy and cholecystectomy with gangrenous cholecystitis.   FAMILY HISTORY: Breast cancer in mother. Diabetes  in father and grandfather.   SOCIAL HISTORY: Quit smoking in 1986, smoked for 25 years. Drinks alcohol very rarely. Retired from [employer].   HOME MEDICATIONS PRIOR TO ADMISSION: 1.  Aspirin 81 mg. 2.  Hydrochlorothiazide 25 mg daily. 3.  Vytorin 10/20 mg daily.  CURRENT MEDICATIONS: 1.  PPI IV b.i.d.  2.  Zosyn IV every 8 hours. 3.  Lovenox for DVT prophylaxis. 4.  Insulin for sliding scale coverage.  PHYSICAL EXAMINATION: VITAL SIGNS: In the ER, temperature 97.7, pulse rate 84, respirations 20 and blood pressure 166/89. Pulse ox is 92 on 2 liters oxygen supplementation.  GENERAL: The patient is fully alert and oriented to time, place and person, in no acute distress.  HEENT: Head and neck atraumatic. Conjunctivae pink. Oral mucosa moist.  NECK: Supple. No JVD.  LUNGS: Bilateral equal air entry. Few wheezing and crepitation heard. Using nasal cannula oxygen.  HEART: S1 and S2 present, regular. No murmur.  ABDOMEN: Tenderness present at the surgical site. Sutures and dressing from surgery present. There is drainage tube in gallbladder area present. Bowel sounds present.  SKIN: No rashes.  LEGS: No edema.  NEUROLOGICAL: Moves all 4 limbs. Power 5/5. No tremors or rigidity.  PSYCHIATRIC: Does not appear in any acute psychiatric illness at this time.   IMPORTANT LABORATORY AND DIAGNOSTICS: Glucose 191, BUN 11, creatinine 0.89, sodium 132, potassium 4.4, chloride 101, CO2 31, total protein 7.1, albumin 2.9, bilirubin 0.6. WBC 16.3, hemoglobin 13 and platelet count 291.   Chest x-ray, portable, shows pulmonary vascular congestion without overt edema. Peribronchial cuffing. Left basilar opacity, favor atelectasis.   ASSESSMENT AND PLAN: A 69 year old male with remote smoking history admitted with gallbladder surgery status post cholecystectomy and ERCP, post operative distress and aspiration  with chronic obstructive pulmonary disease exacerbation.  1.  Acute respiratory failure due  to chronic obstructive pulmonary disease exacerbation. Will continue on oxygen as needed and Dr. Mortimer Fries from pulmonary already saw the patient, suggested IV steroid, Pulmicort and DuoNeb, so we will continue this treatment.  2.  Aspiration pneumonitis. The patient is already on Zosyn by surgical team. This is for gallbladder and that will be also effective for his pneumonia. Will continue the same.  3.  Diabetes. He is currently n.p.o. and on insulin sliding scale coverage. We will continue the same.  4.  Hypertension. Currently blood pressure is under control so hold the medication.  5.  For cholecystitis, surgery and gastroenterology is following. Continue management as per them.  6.  Deep vein thrombosis prophylaxis. As per surgery, Lovenox.   Thanks for letting us participate in this patient's management. Will continue following.  TOTAL TIME SPENT ON THIS CONSULT: 50 minutes.  ____________________________ Ceasar Lund Anselm Jungling, MD vgv:sb D: 09/28/2013 15:22:31 ET T: 09/28/2013 15:38:31 ET JOB#: [id number]  cc: Ceasar Lund. Anselm Jungling, MD, <Dictator> Vaughan Basta MD ELECTRONICALLY SIGNED 10/18/2013 10:09

## 2015-03-10 NOTE — Consult Note (Signed)
Chief Complaint:  Subjective/Chief Complaint Pt denies abdominal pain.  Denies N/V.  Appetite improving.  No SOB now.   VITAL SIGNS/ANCILLARY NOTES: **Vital Signs.:   12-Nov-14 09:45  Temperature Temperature (F) 98.4  Celsius 36.8  Temperature Source oral  Pulse Pulse 77  Respirations Respirations 17  Systolic BP Systolic BP 920  Diastolic BP (mmHg) Diastolic BP (mmHg) 75  Mean BP 105  Pulse Ox % Pulse Ox % 90  Pulse Ox Activity Level  At rest  Oxygen Delivery Room Air/ 21 %   Brief Assessment:  GEN well developed, no acute distress, obese, Friend at bedside.   Cardiac Regular   Respiratory normal resp effort  clear BS  no use of accessory muscles   Gastrointestinal Normal   Gastrointestinal details normal Soft  Nontender  Nondistended  Dressings intact.  JP drain w/ 10cc bilous drainage   EXTR negative cyanosis/clubbing, negative edema   Additional Physical Exam Skin: pink, warm, dry   Lab Results:  Hepatic:  12-Nov-14 05:34   Bilirubin, Total 0.7  Alkaline Phosphatase 83  SGPT (ALT) 25  SGOT (AST) 18  Total Protein, Serum 6.6  Albumin, Serum  2.6  Routine Chem:  12-Nov-14 05:34   Glucose, Serum  258  BUN 12  Sodium, Serum  135  Potassium, Serum 3.8  Chloride, Serum 101  CO2, Serum 29  Calcium (Total), Serum 8.7  Osmolality (calc) 279  eGFR (African American) >60  eGFR (Non-African American) >60 (eGFR values <90m/min/1.73 m2 may be an indication of chronic kidney disease (CKD). Calculated eGFR is useful in patients with stable renal function. The eGFR calculation will not be reliable in acutely ill patients when serum creatinine is changing rapidly. It is not useful in  patients on dialysis. The eGFR calculation may not be applicable to patients at the low and high extremes of body sizes, pregnant women, and vegetarians.)  Anion Gap  5  Routine Hem:  12-Nov-14 05:34   WBC (CBC)  13.3  RBC (CBC)  4.02  Hemoglobin (CBC)  12.3  Hematocrit (CBC)   36.2  Platelet Count (CBC) 272  MCV 90  MCH 30.6  MCHC 34.0  RDW 13.1  Neutrophil % 93.8  Lymphocyte % 3.3  Monocyte % 2.8  Eosinophil % 0.0  Basophil % 0.1  Neutrophil #  12.5  Lymphocyte #  0.4  Monocyte # 0.4  Eosinophil # 0.0  Basophil # 0.0 (Result(s) reported on 29 Sep 2013 at 06:26AM.)   Assessment/Plan:  Assessment/Plan:  Assessment Post-cholecystectomy bile leak s/p stent placement: Improving.  JP output decreasing. Bile Aspiration Pneumonitis: Appreciate Dr KZoila Shutterassistance   Plan 1) Advance to clear liquid diet as tolerated 2) continue supportive measures Please call if you have any questions or concerns   Electronic Signatures: JAndria Meuse(NP)  (Signed 12-Nov-14 10:07)  Authored: Chief Complaint, VITAL SIGNS/ANCILLARY NOTES, Brief Assessment, Lab Results, Assessment/Plan   Last Updated: 12-Nov-14 10:07 by JAndria Meuse(NP)

## 2015-03-10 NOTE — Consult Note (Signed)
Brief Consult Note: Diagnosis: Post-cholecystectomy bile leak.   Patient was seen by consultant.   Consult note dictated.   Comments: Mr. Quest is a very pleasant 69 y/o caucasian male who is 2 days s/p cholecystectomy for gangrenous cholecystitis who has had profuse increased bilious drainage with suspected bile leak.  He will need ERCP today with stent placement for bile leak with Dr Allen Norris.  I have discussed risks, benefits, alternatives including but not limited to pancreatitis, bleeding, infection, perforation & drug reaction.  He & wife agree with plan.  He has been NPO.  Plan: 1) ERCP with stent today 2) NPO 3) Continue supportive care  Thanks for consult.  Please see full dictated note. #[id number].  Electronic Signatures: Andria Meuse (NP)  (Signed 11-Nov-14 10:01)  Authored: Brief Consult Note   Last Updated: 11-Nov-14 10:01 by Andria Meuse (NP)

## 2015-03-13 LAB — SURGICAL PATHOLOGY

## 2017-07-30 NOTE — Discharge Instructions (Signed)

## 2017-11-26 ENCOUNTER — Encounter: Payer: Self-pay | Admitting: *Deleted

## 2017-11-26 ENCOUNTER — Other Ambulatory Visit: Payer: Self-pay

## 2017-12-03 ENCOUNTER — Ambulatory Visit: Payer: Medicare Other | Admitting: Anesthesiology

## 2017-12-03 ENCOUNTER — Encounter
Admission: RE | Disposition: A | Discharge status: Home / Self Care | Payer: Self-pay | Source: Ambulatory Visit | Attending: Ophthalmology

## 2017-12-03 ENCOUNTER — Ambulatory Visit
Admission: RE | Admit: 2017-12-03 | Discharge: 2017-12-03 | Disposition: A | Discharge status: Home / Self Care | Payer: Medicare Other | Source: Ambulatory Visit | Attending: Ophthalmology | Admitting: Ophthalmology

## 2017-12-03 DIAGNOSIS — I10 Essential (primary) hypertension: Secondary | ICD-10-CM | POA: Insufficient documentation

## 2017-12-03 DIAGNOSIS — F419 Anxiety disorder, unspecified: Secondary | ICD-10-CM | POA: Insufficient documentation

## 2017-12-03 DIAGNOSIS — H2512 Age-related nuclear cataract, left eye: Secondary | ICD-10-CM | POA: Diagnosis not present

## 2017-12-03 DIAGNOSIS — E7800 Pure hypercholesterolemia, unspecified: Secondary | ICD-10-CM | POA: Insufficient documentation

## 2017-12-03 DIAGNOSIS — H5703 Miosis: Secondary | ICD-10-CM | POA: Insufficient documentation

## 2017-12-03 DIAGNOSIS — E119 Type 2 diabetes mellitus without complications: Secondary | ICD-10-CM | POA: Diagnosis not present

## 2017-12-03 DIAGNOSIS — Z7984 Long term (current) use of oral hypoglycemic drugs: Secondary | ICD-10-CM | POA: Diagnosis not present

## 2017-12-03 DIAGNOSIS — Z79899 Other long term (current) drug therapy: Secondary | ICD-10-CM | POA: Diagnosis not present

## 2017-12-03 DIAGNOSIS — Z87891 Personal history of nicotine dependence: Secondary | ICD-10-CM | POA: Diagnosis not present

## 2017-12-03 HISTORY — DX: Essential (primary) hypertension: I10

## 2017-12-03 HISTORY — DX: Type 2 diabetes mellitus without complications: E11.9

## 2017-12-03 HISTORY — DX: Pure hypercholesterolemia, unspecified: E78.00

## 2017-12-03 HISTORY — DX: Anxiety disorder, unspecified: F41.9

## 2017-12-03 HISTORY — DX: Gastro-esophageal reflux disease without esophagitis: K21.9

## 2017-12-03 HISTORY — PX: CATARACT EXTRACTION W/PHACO: SHX586

## 2017-12-03 HISTORY — DX: Claustrophobia: F40.240

## 2017-12-03 LAB — GLUCOSE, CAPILLARY
GLUCOSE-CAPILLARY: 225 mg/dL — AB (ref 65–99)
GLUCOSE-CAPILLARY: 213 mg/dL — AB (ref 65–99)

## 2017-12-03 SURGERY — PHACOEMULSIFICATION, CATARACT, WITH IOL INSERTION
Anesthesia: Monitor Anesthesia Care | Site: Eye | Laterality: Left | Wound class: Clean

## 2017-12-03 MED ORDER — LIDOCAINE HCL (PF) 2 % IJ SOLN
INTRAOCULAR | Status: DC | PRN
  Administered 2017-12-03: 1 mL via INTRAMUSCULAR

## 2017-12-03 MED ORDER — BRIMONIDINE TARTRATE-TIMOLOL 0.2-0.5 % OP SOLN
OPHTHALMIC | Status: DC | PRN
  Administered 2017-12-03: 1 [drp] via OPHTHALMIC

## 2017-12-03 MED ORDER — LACTATED RINGERS IV SOLN: 10.0000 mL/h | INTRAVENOUS | Status: DC

## 2017-12-03 MED ORDER — FENTANYL CITRATE (PF) 100 MCG/2ML IJ SOLN
INTRAMUSCULAR | Status: DC | PRN
  Administered 2017-12-03: 50 ug via INTRAVENOUS

## 2017-12-03 MED ORDER — EPINEPHRINE PF 1 MG/ML IJ SOLN
INTRAOCULAR | Status: DC | PRN
  Administered 2017-12-03: 74 mL via OPHTHALMIC

## 2017-12-03 MED ORDER — MIDAZOLAM HCL 2 MG/2ML IJ SOLN
INTRAMUSCULAR | Status: DC | PRN
  Administered 2017-12-03: 2 mg via INTRAVENOUS

## 2017-12-03 MED ORDER — ARMC OPHTHALMIC DILATING DROPS
1.0000 "application " | OPHTHALMIC | Status: DC | PRN
  Administered 2017-12-03 (×3): 1 via OPHTHALMIC

## 2017-12-03 MED ORDER — MOXIFLOXACIN HCL 0.5 % OP SOLN
1.0000 [drp] | OPHTHALMIC | Status: DC | PRN
  Administered 2017-12-03 (×3): 1 [drp] via OPHTHALMIC

## 2017-12-03 MED ORDER — CEFUROXIME OPHTHALMIC INJECTION 1 MG/0.1 ML
INJECTION | OPHTHALMIC | Status: DC | PRN
  Administered 2017-12-03: 0.1 mL via INTRACAMERAL

## 2017-12-03 MED ORDER — NA HYALUR & NA CHOND-NA HYALUR 0.4-0.35 ML IO KIT
PACK | INTRAOCULAR | Status: DC | PRN
  Administered 2017-12-03: 1 mL via INTRAOCULAR

## 2017-12-03 SURGICAL SUPPLY — 25 items
CANNULA ANT/CHMB 27GA (MISCELLANEOUS) ×3 IMPLANT
CARTRIDGE ABBOTT (MISCELLANEOUS) IMPLANT
GLOVE SURG LX 7.5 STRW (GLOVE) ×2
GLOVE SURG LX STRL 7.5 STRW (GLOVE) ×1 IMPLANT
GLOVE SURG TRIUMPH 8.0 PF LTX (GLOVE) ×3 IMPLANT
GOWN STRL REUS W/ TWL LRG LVL3 (GOWN DISPOSABLE) ×2 IMPLANT
GOWN STRL REUS W/TWL LRG LVL3 (GOWN DISPOSABLE) ×4
LENS IOL TECNIS ITEC 19.5 (Intraocular Lens) ×3 IMPLANT
MARKER SKIN DUAL TIP RULER LAB (MISCELLANEOUS) ×3 IMPLANT
NDL RETROBULBAR .5 NSTRL (NEEDLE) IMPLANT
NEEDLE FILTER BLUNT 18X 1/2SAF (NEEDLE) ×2
NEEDLE FILTER BLUNT 18X1 1/2 (NEEDLE) ×1 IMPLANT
PACK CATARACT BRASINGTON (MISCELLANEOUS) ×3 IMPLANT
PACK EYE AFTER SURG (MISCELLANEOUS) ×3 IMPLANT
PACK OPTHALMIC (MISCELLANEOUS) ×3 IMPLANT
RING MALYGIN 7.0 (MISCELLANEOUS) ×3 IMPLANT
SUT ETHILON 10-0 CS-B-6CS-B-6 (SUTURE)
SUT VICRYL  9 0 (SUTURE)
SUT VICRYL 9 0 (SUTURE) IMPLANT
SUTURE EHLN 10-0 CS-B-6CS-B-6 (SUTURE) IMPLANT
SYR 3ML LL SCALE MARK (SYRINGE) ×3 IMPLANT
SYR 5ML LL (SYRINGE) ×3 IMPLANT
SYR TB 1ML LUER SLIP (SYRINGE) ×3 IMPLANT
WATER STERILE IRR 250ML POUR (IV SOLUTION) ×3 IMPLANT
WIPE NON LINTING 3.25X3.25 (MISCELLANEOUS) ×3 IMPLANT

## 2017-12-03 NOTE — Transfer of Care (Signed)
Immediate Anesthesia Transfer of Care Note  Patient: Sean Garner.  Procedure(s) Performed: CATARACT EXTRACTION PHACO AND INTRAOCULAR LENS PLACEMENT (IOC) COMPLICATED DIABETIC LEFT (Left Eye)  Patient Location: PACU  Anesthesia Type: MAC  Level of Consciousness: awake, alert  and patient cooperative  Airway and Oxygen Therapy: Patient Spontanous Breathing and Patient connected to supplemental oxygen  Post-op Assessment: Post-op Vital signs reviewed, Patient's Cardiovascular Status Stable, Respiratory Function Stable, Patent Airway and No signs of Nausea or vomiting  Post-op Vital Signs: Reviewed and stable  Complications: No apparent anesthesia complications

## 2017-12-03 NOTE — Op Note (Signed)
OPERATIVE NOTE  Sohail Capraro [id number] 12/03/2017  PREOPERATIVE DIAGNOSIS:   Nuclear sclerotic cataract left eye with miotic pupil      H25.12   POSTOPERATIVE DIAGNOSIS:   Nuclear sclerotic cataract left eye with miotic pupil.     PROCEDURE:  Phacoemulsification with posterior chamber intraocular lens implantation of the left eye which required pupil stretching with the Malyugin pupil expansion device   LENS:   Implant Name Type Inv. Item Serial No. Manufacturer Lot No. LRB No. Used  LENS IOL DIOP 19.5 - [id number] Intraocular Lens LENS IOL DIOP 19.5 [id number]  Left 1        ULTRASOUND TIME: 24 % of 1 minutes, 38 seconds.  CDE 23.2   SURGEON:  Wyonia Hough, MD   ANESTHESIA: Topical with tetracaine drops and 2% Xylocaine jelly, augmented with 1% preservative-free intracameral lidocaine.   COMPLICATIONS:  None.   DESCRIPTION OF PROCEDURE:  The patient was identified in the holding room and transported to the operating room and placed in the supine position under the operating microscope.  The left eye was identified as the operative eye and it was prepped and draped in the usual sterile ophthalmic fashion.   A 1 millimeter clear-corneal paracentesis was made at the 1:30 position.  The anterior chamber was filled with Viscoat viscoelastic.  0.5 ml of preservative-free 1% lidocaine was injected into the anterior chamber.  A 2.4 millimeter keratome was used to make a near-clear corneal incision at the 10:30 position.  A Malyugin pupil expander was then placed through the main incision and into the anterior chamber of the eye.  The edge of the iris was secured on the lip of the pupil expander and it was released, thereby expanding the pupil to approximately 7 millimeters for completion of the cataract surgery.  Additional Viscoat was placed in the anterior chamber.  A cystotome and capsulorrhexis forceps were used to make a curvilinear capsulorrhexis.   Balanced salt solution  was used to hydrodissect and hydrodelineate the lens nucleus.   Phacoemulsification was used in stop and chop fashion to remove the lens, nucleus and epinucleus.  The remaining cortex was aspirated using the irrigation aspiration handpiece.  Additional Provisc was placed into the eye to distend the capsular bag for lens placement.  A lens was then injected into the capsular bag.  The pupil expanding ring was removed using a Kuglen hook and insertion device. The remaining viscoelastic was aspirated from the capsular bag and the anterior chamber.  The anterior chamber was filled with balanced salt solution to inflate to a physiologic pressure.   Wounds were hydrated with balanced salt solution.  The anterior chamber was inflated to a physiologic pressure with balanced salt solution.  No wound leaks were noted. Cefuroxime 0.1 ml of a 10mg /ml solution was injected into the anterior chamber for a dose of 1 mg of intracameral antibiotic at the completion of the case.   Timolol and Brimonidine drops were applied to the eye.  The patient was taken to the recovery room in stable condition without complications of anesthesia or surgery.  Tristain Daily 12/03/2017, 8:05 AM

## 2017-12-03 NOTE — Anesthesia Postprocedure Evaluation (Signed)
Anesthesia Post Note  Patient: Sean Garner.  Procedure(s) Performed: CATARACT EXTRACTION PHACO AND INTRAOCULAR LENS PLACEMENT (IOC) COMPLICATED DIABETIC LEFT (Left Eye)  Patient location during evaluation: PACU Anesthesia Type: MAC Level of consciousness: awake and alert, oriented and patient cooperative Pain management: pain level controlled Vital Signs Assessment: post-procedure vital signs reviewed and stable Respiratory status: spontaneous breathing, nonlabored ventilation and respiratory function stable Cardiovascular status: blood pressure returned to baseline and stable Postop Assessment: adequate PO intake Anesthetic complications: no    Darrin Nipper

## 2017-12-03 NOTE — Anesthesia Procedure Notes (Signed)
Procedure Name: MAC Performed by: Reeves Musick, CRNA Pre-anesthesia Checklist: Patient identified, Emergency Drugs available, Suction available, Timeout performed and Patient being monitored Patient Re-evaluated:Patient Re-evaluated prior to induction Oxygen Delivery Method: Nasal cannula Placement Confirmation: positive ETCO2       

## 2017-12-03 NOTE — H&P (Signed)
The History and Physical notes are on paper, have been signed, and are to be scanned. The patient remains stable and unchanged from the H&P.   Previous H&P reviewed, patient examined, and there are no changes.  Sean Garner 12/03/2017 7:38 AM

## 2017-12-03 NOTE — Anesthesia Preprocedure Evaluation (Signed)
Anesthesia Evaluation  Patient identified by MRN, date of birth, ID band Patient awake    Reviewed: Allergy & Precautions, NPO status , Patient's Chart, lab work & pertinent test results  History of Anesthesia Complications Negative for: history of anesthetic complications  Airway Mallampati: II  TM Distance: >3 FB Neck ROM: Full    Dental no notable dental hx.    Pulmonary former smoker (quit 1986),    Pulmonary exam normal breath sounds clear to auscultation       Cardiovascular Exercise Tolerance: Good hypertension, Normal cardiovascular exam Rhythm:Regular Rate:Normal     Neuro/Psych PSYCHIATRIC DISORDERS Anxiety negative neurological ROS     GI/Hepatic GERD  ,  Endo/Other  diabetes, Type 2  Renal/GU negative Renal ROS     Musculoskeletal   Abdominal   Peds  Hematology negative hematology ROS (+)   Anesthesia Other Findings   Reproductive/Obstetrics                             Anesthesia Physical Anesthesia Plan  ASA: II  Anesthesia Plan: MAC   Post-op Pain Management:    Induction: Intravenous  PONV Risk Score and Plan: 1 and Midazolam  Airway Management Planned: Natural Airway  Additional Equipment:   Intra-op Plan:   Post-operative Plan:   Informed Consent: I have reviewed the patients History and Physical, chart, labs and discussed the procedure including the risks, benefits and alternatives for the proposed anesthesia with the patient or authorized representative who has indicated his/her understanding and acceptance.     Plan Discussed with: CRNA  Anesthesia Plan Comments:         Anesthesia Quick Evaluation

## 2018-07-02 ENCOUNTER — Encounter: Payer: Self-pay | Admitting: Dietician

## 2018-07-02 ENCOUNTER — Encounter: Payer: Medicare Other | Attending: Family Medicine | Admitting: Dietician

## 2018-07-02 VITALS — Ht 70.0 in | Wt 212.0 lb

## 2018-07-02 DIAGNOSIS — Z6830 Body mass index (BMI) 30.0-30.9, adult: Secondary | ICD-10-CM | POA: Diagnosis not present

## 2018-07-02 DIAGNOSIS — E119 Type 2 diabetes mellitus without complications: Secondary | ICD-10-CM | POA: Insufficient documentation

## 2018-07-02 DIAGNOSIS — Z713 Dietary counseling and surveillance: Secondary | ICD-10-CM | POA: Diagnosis not present

## 2018-07-02 NOTE — Patient Instructions (Signed)
   Choose healthy snacks such as fruit, small portions of nuts, granola bar with less than 10grams sugar; graham cracker with peanut butter.   Eat generous portions of low-carb, "free" vegetables, and controlled portions of starchy foods.   Keep a diary of what and how much you eat for at least 3 days.   Resume walking for exercise, ideally 30 minutes or more, 4 or more days a week.

## 2018-07-02 NOTE — Progress Notes (Signed)
Medical Nutrition Therapy: Visit start time: 1330  end time: 1430  Assessment:  Diagnosis: Type 2 diabetes Past medical history: HTN, HLD Psychosocial issues/ stress concerns: reports high stress level at times; owns and manages several rental properties  Preferred learning method:  . Hands-on   Current weight: 212lbs Height: 5'10" Medications, supplements: reconciled list in medical record  Progress and evaluation: Patient reports diagnosis of diabetes about 3 years ago, after having galbladder surgery. He requests help with understanding appropriate diet for managing blood sugars. Denies any hypoglycemia symptoms; reports BGs have recently improved with the addition of Glipizide.   Physical activity: walking 45 minutes, 3 times per week  Dietary Intake:  Usual eating pattern includes 3 meals and 1-2 snacks per day. Dining out frequency: 4 meals per week.  Breakfast: cereal or bagel Snack: none Lunch: sometimes salad at [facility] or [employer] no large meal; tomato soup with crackers Snack: banana or apple; likes sweets such as [employer] peanut butter cups Supper: likes seafood, likes asian food; ground beef patty with vegetables--green beans, broccoli, peas, baked squash; rarely desserts Snack: sometimes popcorn 1/2 bag Beverages: decaf tea sugar free, 2cups regular coffee in am with creamer and sweet n low, water  Nutrition Care Education: Topics covered: diabetes. weight control Basic nutrition: basic food groups, appropriate nutrient balance, appropriate meal and snack schedule, general nutrition guidelines    Weight control: benefits of weight control, controlling food portions; importance of low fat and low sugar food choices; benefits of tracking food intake Advanced nutrition: dining out Diabetes:  goals for BGs, appropriate meal and snack schedule, appropriate carb intake and balance, plate method for basic meal planning, meal options and menus, role of exercise   Nutritional  Diagnosis:  [place]-2.2 Altered nutrition-related laboratory As related to diabetes.  As evidenced by capillary BG test result of 213, 225 in 11/2017, and patient report of home blood glucose testing results. . [place]-3.3 Overweight/obesity As related to excess calories.  As evidenced by patient with BMI of 30, patient report of frequent dining out, other high calorie foods.  Intervention: Instruction as noted above.   Patient has begun making diet and lifestyle changes.    Set goals with direction from patient.      Education Materials given:  . General diet guidelines for Diabetes . Plate Planner with food lists . Dining out resource . Sample meal pattern/ menus . Goals/ instructions   Learner/ who was taught:  . Patient    Level of understanding: Marland Kitchen Verbalizes/ demonstrates competency   Demonstrated degree of understanding via:   Teach back Learning barriers: . None  Willingness to learn/ readiness for change: . Eager, change in progress   Monitoring and Evaluation:  Dietary intake, exercise, BG control, and body weight      follow up: 08/20/18

## 2018-08-20 ENCOUNTER — Ambulatory Visit: Payer: Medicare Other | Admitting: Dietician

## 2018-09-21 ENCOUNTER — Encounter: Payer: Self-pay | Admitting: Dietician

## 2018-09-21 NOTE — Progress Notes (Signed)
Have not heard back from patient to reschedule his cancelled appointment from 08/20/18. Sent discharge letter to referring provider.

## 2020-09-21 ENCOUNTER — Other Ambulatory Visit (HOSPITAL_COMMUNITY): Payer: Self-pay | Admitting: Family Medicine

## 2020-09-21 ENCOUNTER — Other Ambulatory Visit: Payer: Self-pay | Admitting: Family Medicine

## 2020-09-21 DIAGNOSIS — R221 Localized swelling, mass and lump, neck: Secondary | ICD-10-CM

## 2020-09-25 ENCOUNTER — Other Ambulatory Visit: Payer: Self-pay

## 2020-09-25 ENCOUNTER — Ambulatory Visit
Admission: RE | Admit: 2020-09-25 | Discharge: 2020-09-25 | Disposition: A | Discharge status: Home / Self Care | Payer: Medicare Other | Source: Ambulatory Visit | Attending: Family Medicine | Admitting: Family Medicine

## 2020-09-25 DIAGNOSIS — R221 Localized swelling, mass and lump, neck: Secondary | ICD-10-CM | POA: Diagnosis not present

## 2020-09-26 ENCOUNTER — Other Ambulatory Visit: Payer: Self-pay | Admitting: Family Medicine

## 2020-09-26 DIAGNOSIS — R221 Localized swelling, mass and lump, neck: Secondary | ICD-10-CM

## 2020-09-28 ENCOUNTER — Other Ambulatory Visit: Payer: Self-pay

## 2020-09-28 ENCOUNTER — Ambulatory Visit
Admission: RE | Admit: 2020-09-28 | Discharge: 2020-09-28 | Disposition: A | Discharge status: Home / Self Care | Payer: Medicare Other | Source: Ambulatory Visit | Attending: Family Medicine | Admitting: Family Medicine

## 2020-09-28 ENCOUNTER — Other Ambulatory Visit
Admission: RE | Admit: 2020-09-28 | Discharge: 2020-09-28 | Disposition: A | Discharge status: Home / Self Care | Payer: Medicare Other | Source: Home / Self Care | Attending: Family Medicine | Admitting: Family Medicine

## 2020-09-28 DIAGNOSIS — Z5181 Encounter for therapeutic drug level monitoring: Secondary | ICD-10-CM | POA: Insufficient documentation

## 2020-09-28 DIAGNOSIS — R221 Localized swelling, mass and lump, neck: Secondary | ICD-10-CM | POA: Insufficient documentation

## 2020-09-28 DIAGNOSIS — Z79899 Other long term (current) drug therapy: Secondary | ICD-10-CM | POA: Insufficient documentation

## 2020-09-28 LAB — CREATININE, SERUM
Creatinine, Ser: 0.94 mg/dL (ref 0.61–1.24)
GFR, Estimated: >60 mL/min (ref >60)

## 2020-09-28 MED ORDER — IOHEXOL 300 MG/ML  SOLN
75.0000 mL | Freq: Once | INTRAMUSCULAR | Status: AC | PRN
  Administered 2020-09-28: 75 mL via INTRAVENOUS

## 2020-10-03 ENCOUNTER — Ambulatory Visit: Payer: PRIVATE HEALTH INSURANCE

## 2020-10-05 ENCOUNTER — Other Ambulatory Visit: Payer: Self-pay | Admitting: Unknown Physician Specialty

## 2020-10-05 DIAGNOSIS — R590 Localized enlarged lymph nodes: Secondary | ICD-10-CM

## 2020-10-09 ENCOUNTER — Other Ambulatory Visit: Payer: Self-pay | Admitting: Radiology

## 2020-10-09 NOTE — Progress Notes (Signed)
Patient on schedule for LN biopsy. Called and spoke with patient , made aware to be here @ 1230, NPO after 0630, and driver for discharge post procedure if decides to get sedation. Stated understanding.

## 2020-10-11 ENCOUNTER — Other Ambulatory Visit: Payer: Self-pay

## 2020-10-11 ENCOUNTER — Ambulatory Visit
Admission: RE | Admit: 2020-10-11 | Discharge: 2020-10-11 | Disposition: A | Discharge status: Home / Self Care | Payer: Medicare Other | Source: Ambulatory Visit | Attending: Unknown Physician Specialty | Admitting: Unknown Physician Specialty

## 2020-10-11 DIAGNOSIS — R591 Generalized enlarged lymph nodes: Secondary | ICD-10-CM | POA: Diagnosis present

## 2020-10-11 DIAGNOSIS — C770 Secondary and unspecified malignant neoplasm of lymph nodes of head, face and neck: Secondary | ICD-10-CM | POA: Insufficient documentation

## 2020-10-11 DIAGNOSIS — R590 Localized enlarged lymph nodes: Secondary | ICD-10-CM

## 2020-10-11 NOTE — Procedures (Signed)
Interventional Radiology Procedure Note  Procedure: US guided core biopsy of right neck mass  Complications: None  Estimated Blood Loss: None  Recommendations: - DC home   Signed,  Criselda Peaches, MD

## 2020-10-16 ENCOUNTER — Other Ambulatory Visit: Payer: Self-pay | Admitting: Anatomic Pathology & Clinical Pathology

## 2020-10-16 LAB — SURGICAL PATHOLOGY

## 2020-10-16 NOTE — Progress Notes (Signed)
[facility]  [address], [address] [place], [place] [address] Phone: [phone]  Fax: [phone]   Clinic Day:  10/17/2020  Referring physician: Beverly Gust, MD  Chief Complaint: Sean Garner. is a 74 y.o. 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 Beverly Gust for assessment and management.   HPI:  The patient noticed a lump on his right neck in late August /early September 2021.  It appeared after he received the second COVID-19 vaccine. The lump was not painful and did not affect his swallowing.  He went to the [place] at the end of October 2021 and was told that he might have an infection. He was seen by Dr. Baldemar Lenis on 09/21/2020 for a wellness exam. Exam revealed an approximately 5 cm right lateral neck mass. The mass was non-mobile and non-tender.  Head and neck soft tissue ultrasound on 09/25/2020 revealed a nonspecific 3.3 cm soft tissue mass in the region of clinical concern.  Neck soft tissue CT on 09/28/2020 revealed a 3.5 x 3.0 x 4.8 cm right neck mass most c/w enlarged lymph node. There was a 14 mm enhancing mass in the right base of tongue.   The patient was seen by Dr. Tami Ribas on 10/04/2020. Exam showed a right tongue base mass with adjacent neck mass. Ultrasound guided right cervical lymph node biopsy on 10/11/2020 revealed squamous cell carcinoma, keratinizing.  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  Symptomatically, he feels that the lump on his neck is growing. He coughs occasionally; his voice has always been hoarse. He takes medication for reflux. He felt nauseous yesterday and stuck his finger down his throat to make himself vomit. He notes that this does not usually happen to him.  At baseline, he  has decreased hearing.  He denies fevers, sweats, headaches, changes in vision, runny nose, sore throat, shortness of breath, chest pain, palpitations, diarrhea,  urinary symptoms, bone or joint symptoms, skin changes, numbness, weakness, balance or coordination problems, and bleeding of any kind.  His last A1C was 7.2%. He recently increased his Metformin dose.  He has claustrophobia and does not think he would be able to get an MRI.  With discussion regarding treatment, he is interested in chemotherapy with concurrent radiation. He would prefer weekly doses of cisplatin rather than every 3 week doses. He is worried potential surgery. He is interested in a port-a-cath as he states that people have had trouble finding his veins in the past.  The patient's mother had breast cancer. He had 2 maternal uncles who had lung cancer.   Past Medical History:  Diagnosis Date  . Anxiety   . Claustrophobia   . Diabetes mellitus without complication ([place])   . GERD (gastroesophageal reflux disease)   . Hypercholesterolemia   . Hypertension     Past Surgical History:  Procedure Laterality Date  . CATARACT EXTRACTION W/PHACO Left 12/03/2017   Procedure: CATARACT EXTRACTION PHACO AND INTRAOCULAR LENS PLACEMENT ([place]) COMPLICATED DIABETIC LEFT;  Surgeon: Leandrew Koyanagi, MD;  Location: [place];  Service: Ophthalmology;  Laterality: Left;  Diabetic - oral meds  . CHOLECYSTECTOMY      Family History  Problem Relation Age of Onset  . Cancer Mother     Social History:  reports that he quit smoking about 35 years ago. He has never used smokeless tobacco. He reports previous alcohol use. No history on file for drug use. He  quit smoking cold [place] in 1986. He smoked 2 packs per day for 20 years. He drinks a glass of wine or a Margarita occasionally. 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 He was exposed to [employer]. He worked with a Geologist, engineering for 34 years. The patient is accompanied by his wife today.  Allergies: No Known Allergies  Current Medications: Current Outpatient Medications  Medication Sig Dispense Refill  .  citalopram (CELEXA) 10 MG tablet Take 10 mg by mouth daily.    Marland Kitchen glipiZIDE (GLUCOTROL XL) 5 MG 24 hr tablet Take 5 mg by mouth daily.  1  . glucose blood test strip CHECK FASTING BLOOD SUGAR EVERY MORNING. (NEED SIGNATURE)    . losartan-hydrochlorothiazide (HYZAAR) 50-12.5 MG tablet Take 1 tablet by mouth daily.    . metFORMIN (GLUCOPHAGE-XR) 500 MG 24 hr tablet Take 500 mg by mouth 2 (two) times daily.    . pantoprazole (PROTONIX) 40 MG tablet Take 40 mg by mouth daily.    . simvastatin (ZOCOR) 40 MG tablet Take by mouth.    Marland Kitchen aspirin EC 81 MG tablet Take by mouth. (Patient not taking: Reported on 10/17/2020)    . esomeprazole (NEXIUM) 40 MG capsule Take 40 mg by mouth as needed. (Patient not taking: Reported on 10/17/2020)    . ezetimibe-simvastatin (VYTORIN) 10-20 MG tablet Take 1 tablet by mouth daily at 6 PM. (Patient not taking: Reported on 10/17/2020)     No current facility-administered medications for this visit.    Review of Systems  Constitutional: Negative for chills, diaphoresis, fever, malaise/fatigue and weight loss.  HENT: Positive for hearing loss. Negative for congestion, ear discharge, ear pain, nosebleeds, sinus pain, sore throat and tinnitus.        No change in voice.  Eyes: Negative for blurred vision.  Respiratory: Negative for cough, hemoptysis, sputum production and shortness of breath.   Cardiovascular: Negative for chest pain, palpitations and leg swelling.  Gastrointestinal: Positive for heartburn (on meds), nausea (yesterday) and vomiting (yesterday). Negative for abdominal pain, blood in stool, constipation, diarrhea and melena.  Genitourinary: Negative for dysuria, frequency, hematuria and urgency.  Musculoskeletal: Negative for back pain, joint pain, myalgias and neck pain.  Skin: Negative for itching and rash.  Neurological: Negative for dizziness, tingling, sensory change, weakness and headaches.  Endo/Heme/Allergies: Does not bruise/bleed easily.   Psychiatric/Behavioral: Negative for depression and memory loss. The patient is not nervous/anxious and does not have insomnia.   All other systems reviewed and are negative.  Performance status (ECOG): 1  Vitals Blood pressure (!) 157/63, pulse 67, temperature 98 F (36.7 C), resp. rate 20, weight 212 lb 10.1 oz (96.5 kg), SpO2 96 %.   Physical Exam Vitals and nursing note reviewed.  Constitutional:      General: He is not in acute distress.    Appearance: He is not diaphoretic.  HENT:     Head: Normocephalic and atraumatic.     Comments: Gray hair.    Mouth/Throat:     Mouth: Mucous membranes are moist.     Pharynx: Oropharynx is clear.  Eyes:     General: No scleral icterus.    Extraocular Movements: Extraocular movements intact.     Conjunctiva/sclera: Conjunctivae normal.     Pupils: Pupils are equal, round, and reactive to light.     Comments: Blue eyes.  Neck:     Comments: 4.5 x 4 cm right neck adenopathy. Cardiovascular:     Rate and Rhythm: Normal  rate and regular rhythm.     Heart sounds: Normal heart sounds. No murmur heard.   Pulmonary:     Effort: Pulmonary effort is normal. No respiratory distress.     Breath sounds: Normal breath sounds. No wheezing or rales.  Chest:     Chest wall: No tenderness.  Abdominal:     General: Bowel sounds are normal. There is no distension.     Palpations: Abdomen is soft. There is no mass.     Tenderness: There is no abdominal tenderness. There is no guarding or rebound.  Musculoskeletal:        General: No swelling or tenderness. Normal range of motion.     Cervical back: Normal range of motion and neck supple.  Lymphadenopathy:     Head:     Right side of head: No preauricular, posterior auricular or occipital adenopathy.     Left side of head: No preauricular, posterior auricular or occipital adenopathy.     Upper Body:     Right upper body: No supraclavicular or axillary adenopathy.     Left upper body: No  supraclavicular or axillary adenopathy.     Lower Body: No right inguinal adenopathy. No left inguinal adenopathy.  Skin:    General: Skin is warm and dry.  Neurological:     Mental Status: He is alert and oriented to person, place, and time.  Psychiatric:        Mood and Affect: Mood normal.        Behavior: Behavior normal.        Thought Content: Thought content normal.        Judgment: Judgment normal.     No visits with results within 3 Day(s) from this visit.  Latest known visit with results is:  Hospital Outpatient Visit on 10/11/2020  Component Date Value Ref Range Status  . SURGICAL PATHOLOGY 10/11/2020    Final-Edited                   Value:SURGICAL PATHOLOGY CASE: [id number] PATIENT: [facility] Surgical Pathology Report  Specimen Submitted: A. Right cervical; ln bx  Clinical History: Right tonsillar mass with adjacent lymphadenopathy, favor metastatic head  neck cancer  DIAGNOSIS: A. LYMPH NODE, RIGHT CERVICAL; ULTRASOUND-GUIDED CORE NEEDLE BIOPSY: - SQUAMOUS CELL CARCINOMA, KERATINIZING, P16 POSITIVE.  Comment: No definite residual lymph node tissue is identified.  Sections show squamous cell carcinoma, in a background of dense fibrosis/desmoplasia, with focal adjacent skeletal muscle.  An immunohistochemical study for p16 is moderately and diffusely positive, indicating an HPV driven process.  IHC slides were prepared by Launa Grill, [place]. All controls stained appropriately.  This test was developed and its performance characteristics determined by [facility]. It has not been cleared or approved by the [place] Food and Drug Administration. The FDA does not require this test to go t                         hrough premarket FDA review. This test is used for clinical purposes. It should not be regarded as investigational or for research. This laboratory is certified under the Clinical Laboratory Improvement Amendments (CLIA) as qualified to perform high  complexity clinical laboratory testing.  GROSS DESCRIPTION: A. Labeled: Right lateral neck mass Received: 1 portion in saline, 1 portion in formalin Tissue fragment(s): Multiple Size: 1 x 0.5 x 0.2 cm in aggregate Description: Multiple cores of pale white cylindrically shaped tissue fragments.  A touch prep is  made Entirely submitted and designated: 1-saline tissue 2-3-formalin fixed tissue   Final Diagnosis performed by Allena Napoleon, MD.   Electronically signed 10/16/2020 8:06:18AM The electronic signature indicates that the named Attending Pathologist has evaluated the specimen Technical component performed at [facility], [address], [place], [place] [address] Lab: [phone] Dir: Rush Farmer, MD, MMM  Professiona                         l component performed at [facility], [place], [place], [place] [address] Lab: [phone] Dir: Dellia Nims. Rubinas, MD     Assessment:  Sean Garner. is a 74 y.o. 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  He presented with right neck adenopathy.    Neck soft tissue CT on 09/28/2020 revealed a 3.5 x 3.0 x 4.8 cm right neck mass most c/w enlarged lymph node. There was a 14 mm enhancing mass in the right base of tongue.   Symptomatically, he feels good.  He denies any systemic symptoms.  Exam reveals a 4.5 x 4 cm right cervical lymph node.  Plan: 1.   Labs today:  CBC with diff, CMP. 2.   Clinical stage I right base of tongue carcinoma  Review pathology from 10/11/2020.  Discuss clinical stage I.  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  Discuss PET scan to ensure no other disease (doubt).  Discuss 2 options based on NCCN guidelines in patients with HPV+ single lymph node > 3 cm:   Concurrent chemotherapy with  radiation.    Discuss cisplatin 100 mg/m2 every 3 weeks x 3  OR    Discuss cisplatin 40 mg/m2 every week x 7.     Side effects of chemotherapy reviewed in detail including renal insufficiency, high frequency hearing loss, myelosuppression, electrolyte wasting.   Surgery with neck dissection.    If adverse features noted s/p surgery, chemotherapy/RT may be recommended.  Multiple questions asked and answered.   Patient leaning toward weekly cisplatin and radiation.  Patient plans to talk with Dr Tami Ribas. 3.   PET scan on 10/18/2020. 4.   Consult radiation oncology. 5.   Tumor board on 10/19/2020.  Contact patient after tumor board. 6.   Baseline audiogram. 7.   Chemotherapy class. 8.   Preauth weekly cisplatin. 9.   Anticipate port-a-cath placement. 10.   RTC when radiation starts for MD assessment, labs (CBC with diff, CMP, Mg), and week #1 cisplatin with concurrent radiation.  I discussed the assessment and treatment plan with the patient.  The patient was provided an opportunity to ask questions and all were answered.  The patient agreed with the plan and demonstrated an understanding of the instructions.  The patient was advised to call back if the symptoms worsen or if the condition fails to improve as anticipated.  I provided 46 minutes of face-to-face time during this this encounter and > 50% was spent counseling as documented under my assessment and plan.  An additional 10 minutes were spent reviewing his chart (Epic and Care Everywhere) including notes, labs, and imaging studies.    Jaedan Schuman C. Mike Gip, MD, PhD    10/17/2020, 11:27 AM  I, Mirian Mo Tufford, am acting as Education administrator for [employer]. Mike Gip, MD, PhD.  I, Saragrace Selke C. Mike Gip, MD, have reviewed the  above documentation for accuracy and completeness, and I agree with the above.

## 2020-10-17 ENCOUNTER — Other Ambulatory Visit: Payer: Self-pay

## 2020-10-17 ENCOUNTER — Inpatient Hospital Stay: Payer: Medicare Other | Attending: Hematology and Oncology | Admitting: Hematology and Oncology

## 2020-10-17 ENCOUNTER — Other Ambulatory Visit: Payer: Self-pay | Admitting: Hematology and Oncology

## 2020-10-17 ENCOUNTER — Inpatient Hospital Stay: Payer: Medicare Other

## 2020-10-17 ENCOUNTER — Encounter: Payer: Self-pay | Admitting: Hematology and Oncology

## 2020-10-17 VITALS — BP 157/63 | HR 67 | Temp 98.0°F | Resp 20 | Wt 212.6 lb

## 2020-10-17 DIAGNOSIS — Z801 Family history of malignant neoplasm of trachea, bronchus and lung: Secondary | ICD-10-CM | POA: Diagnosis not present

## 2020-10-17 DIAGNOSIS — Z87891 Personal history of nicotine dependence: Secondary | ICD-10-CM | POA: Diagnosis not present

## 2020-10-17 DIAGNOSIS — K219 Gastro-esophageal reflux disease without esophagitis: Secondary | ICD-10-CM

## 2020-10-17 DIAGNOSIS — Z803 Family history of malignant neoplasm of breast: Secondary | ICD-10-CM | POA: Insufficient documentation

## 2020-10-17 DIAGNOSIS — C770 Secondary and unspecified malignant neoplasm of lymph nodes of head, face and neck: Secondary | ICD-10-CM | POA: Diagnosis not present

## 2020-10-17 DIAGNOSIS — C01 Malignant neoplasm of base of tongue: Secondary | ICD-10-CM | POA: Diagnosis present

## 2020-10-17 DIAGNOSIS — Z79899 Other long term (current) drug therapy: Secondary | ICD-10-CM | POA: Diagnosis not present

## 2020-10-17 DIAGNOSIS — F40240 Claustrophobia: Secondary | ICD-10-CM | POA: Diagnosis not present

## 2020-10-17 LAB — COMPREHENSIVE METABOLIC PANEL
ALT: 22 U/L (ref 0–44)
AST: 21 U/L (ref 15–41)
Albumin: 4.3 g/dL (ref 3.5–5.0)
Alkaline Phosphatase: 58 U/L (ref 38–126)
Anion gap: 11 (ref 5–15)
BUN: 16 mg/dL (ref 8–23)
CO2: 29 mmol/L (ref 22–32)
Calcium: 9.7 mg/dL (ref 8.9–10.3)
Chloride: 96 mmol/L — ABNORMAL LOW (ref 98–111)
Creatinine, Ser: 0.79 mg/dL (ref 0.61–1.24)
GFR, Estimated: >60 mL/min (ref >60)
Glucose, Bld: 175 mg/dL — ABNORMAL HIGH (ref 70–99)
Potassium: 4 mmol/L (ref 3.5–5.1)
Sodium: 136 mmol/L (ref 135–145)
Total Bilirubin: 0.4 mg/dL (ref 0.3–1.2)
Total Protein: 7.8 g/dL (ref 6.5–8.1)

## 2020-10-17 LAB — CBC WITH DIFFERENTIAL/PLATELET
Abs Immature Granulocytes: 0.03 10*3/uL (ref 0.00–0.07)
Basophils Absolute: 0 10*3/uL (ref 0.0–0.1)
Basophils Relative: 0 %
Eosinophils Absolute: 0.1 10*3/uL (ref 0.0–0.5)
Eosinophils Relative: 1 %
HCT: 44.9 % (ref 39.0–52.0)
Hemoglobin: 15.5 g/dL (ref 13.0–17.0)
Immature Granulocytes: 0 %
Lymphocytes Relative: 15 %
Lymphs Abs: 1.2 10*3/uL (ref 0.7–4.0)
MCH: 30 pg (ref 26.0–34.0)
MCHC: 34.5 g/dL (ref 30.0–36.0)
MCV: 87 fL (ref 80.0–100.0)
Monocytes Absolute: 0.5 10*3/uL (ref 0.1–1.0)
Monocytes Relative: 7 %
Neutro Abs: 6.5 10*3/uL (ref 1.7–7.7)
Neutrophils Relative %: 77 %
Platelets: 295 10*3/uL (ref 150–400)
RBC: 5.16 MIL/uL (ref 4.22–5.81)
RDW: 12.5 % (ref 11.5–15.5)
WBC: 8.4 10*3/uL (ref 4.0–10.5)
nRBC: 0 % (ref 0.0–0.2)

## 2020-10-17 NOTE — Patient Instructions (Signed)

## 2020-10-18 ENCOUNTER — Telehealth: Payer: Self-pay | Admitting: *Deleted

## 2020-10-18 NOTE — Telephone Encounter (Signed)
Patient called stating that he has severe clostrophbia and that he wants to be knocked out for his PET scan like when he gets his colonoscopy done, He said they told him they could do this when he had his biopsy. Please return his call

## 2020-10-19 ENCOUNTER — Other Ambulatory Visit: Payer: PRIVATE HEALTH INSURANCE

## 2020-10-19 ENCOUNTER — Other Ambulatory Visit: Payer: Self-pay

## 2020-10-19 MED ORDER — LORAZEPAM 0.5 MG PO TABS: 0.5000 mg | ORAL_TABLET | Freq: Once | ORAL | 0 refills | Status: AC

## 2020-10-20 NOTE — Progress Notes (Signed)
Tumor Board Documentation  Sean Garner. was presented by Dr Mike Gip at our Tumor Board on 10/19/2020, which included representatives from medical oncology, radiation oncology, internal medicine, navigation, pathology, radiology, surgical, pharmacy, genetics, research, palliative care, pulmonology.  Sean Garner currently presents as a new patient, for [place], for new positive pathology with history of the following treatments: surgical intervention(s), active survellience.  Additionally, we reviewed previous medical and familial history, history of present illness, and recent lab results along with all available histopathologic and imaging studies. The tumor board considered available treatment options and made the following recommendations: Concurrent chemo-radiation therapy (vs Surgery)    The following procedures/referrals were also placed: No orders of the defined types were placed in this encounter.   Clinical Trial Status: not discussed   Staging used: Clinical Stage  AJCC Staging: T: 1 N: 1 M: X Group: Stage I Squamous Cell Carcinoma base of Tongue   National site-specific guidelines NCCN were discussed with respect to the case.  Tumor board is a meeting of clinicians from various specialty areas who evaluate and discuss patients for whom a multidisciplinary approach is being considered. Final determinations in the plan of care are those of the provider(s). The responsibility for follow up of recommendations given during tumor board is that of the provider.   Today's extended care, comprehensive team conference, [place] was not present for the discussion and was not examined.   Multidisciplinary Tumor Board is a multidisciplinary case peer review process.  Decisions discussed in the Multidisciplinary Tumor Board reflect the opinions of the specialists present at the conference without having examined the patient.  Ultimately, treatment and diagnostic decisions rest with the primary  provider(s) and the patient.

## 2020-10-23 ENCOUNTER — Telehealth: Payer: Self-pay

## 2020-10-23 ENCOUNTER — Other Ambulatory Visit: Payer: Self-pay | Admitting: *Deleted

## 2020-10-23 ENCOUNTER — Ambulatory Visit
Admission: RE | Admit: 2020-10-23 | Discharge: 2020-10-23 | Disposition: A | Discharge status: Home / Self Care | Payer: Medicare Other | Source: Ambulatory Visit | Attending: Radiation Oncology | Admitting: Radiation Oncology

## 2020-10-23 ENCOUNTER — Encounter: Payer: Self-pay | Admitting: Radiation Oncology

## 2020-10-23 ENCOUNTER — Other Ambulatory Visit: Payer: Self-pay

## 2020-10-23 VITALS — BP 155/71 | HR 63 | Temp 97.3°F | Resp 16 | Wt 212.7 lb

## 2020-10-23 DIAGNOSIS — E119 Type 2 diabetes mellitus without complications: Secondary | ICD-10-CM | POA: Diagnosis not present

## 2020-10-23 DIAGNOSIS — E7800 Pure hypercholesterolemia, unspecified: Secondary | ICD-10-CM | POA: Insufficient documentation

## 2020-10-23 DIAGNOSIS — Z87891 Personal history of nicotine dependence: Secondary | ICD-10-CM | POA: Diagnosis not present

## 2020-10-23 DIAGNOSIS — K219 Gastro-esophageal reflux disease without esophagitis: Secondary | ICD-10-CM | POA: Insufficient documentation

## 2020-10-23 DIAGNOSIS — F419 Anxiety disorder, unspecified: Secondary | ICD-10-CM | POA: Diagnosis not present

## 2020-10-23 DIAGNOSIS — I10 Essential (primary) hypertension: Secondary | ICD-10-CM | POA: Insufficient documentation

## 2020-10-23 DIAGNOSIS — Z7982 Long term (current) use of aspirin: Secondary | ICD-10-CM | POA: Insufficient documentation

## 2020-10-23 DIAGNOSIS — Z79899 Other long term (current) drug therapy: Secondary | ICD-10-CM | POA: Insufficient documentation

## 2020-10-23 DIAGNOSIS — Z7984 Long term (current) use of oral hypoglycemic drugs: Secondary | ICD-10-CM | POA: Diagnosis not present

## 2020-10-23 DIAGNOSIS — C01 Malignant neoplasm of base of tongue: Secondary | ICD-10-CM | POA: Diagnosis not present

## 2020-10-23 DIAGNOSIS — C772 Secondary and unspecified malignant neoplasm of intra-abdominal lymph nodes: Secondary | ICD-10-CM | POA: Diagnosis not present

## 2020-10-23 MED ORDER — ALPRAZOLAM 1 MG PO TABS: 1.0000 mg | ORAL_TABLET | Freq: Every day | ORAL | 0 refills | Status: DC | PRN

## 2020-10-23 NOTE — Telephone Encounter (Signed)
I spoke with patient wife in regards to his PET scan. Dr.Corcorcan was willing to substitute PET scan for a CT scan. Patient wife stated they are going to go forward with PET scan because Dr.Chrystal prescribe patient Xanax and reassured him everything was going to be alright. Patient wife also stated Dr.Chrystal explained to him he could not be put to sleep for Pet scan. Wife stated if he can not do so she will call our office to let us know if CT needs to be scheduled.

## 2020-10-23 NOTE — Consult Note (Signed)
NEW PATIENT EVALUATION  Name: Sean Garner.  MRN: [id number]  Date:   10/23/2020     DOB: 12-03-1945   This 74 y.o. 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  REFERRING PHYSICIAN: Derinda Late, MD  CHIEF COMPLAINT:  Chief Complaint  Patient presents with  . Basal Cell Carcinoma of the tongue    DIAGNOSIS: The encounter diagnosis was Carcinoma of base of tongue ([place]).   PREVIOUS INVESTIGATIONS:  CT scan reviewed Pathology report reviewed Clinical notes reviewed  HPI: Patient is a 74 year old male who presented with an increasing mass in his right subdigastric region.  Mass originally presented with 5 cm.  He had had its neck ultrasound November 8 showing a 3 x 8 0.5 x 3 x 4.8 cm right neck mass consistent with enlarged lymph nodes.  There is also a 1.4 cm enhancing mass in the right base of tongue.  Patient underwent right cervical node biopsy which was positive for p16 positive squamous cell carcinoma.  He is fairly asymptomatic specifically denies dysphagia or head and neck pain.  He is extremely claustrophobic and has refused PET/CT at this point.  CT scan did demonstrate a right neck mass most compatible with a large lymph node metastasis and 1.4 cm Hansen mass in the right base of tongue.  He has been seen by medical oncology and is now referred to radiation oncology for opinion.  PLANNED TREATMENT REGIMEN: Concurrent chemoradiation  PAST MEDICAL HISTORY:  has a past medical history of Anxiety, Claustrophobia, Diabetes mellitus without complication ([place]), GERD (gastroesophageal reflux disease), Hypercholesterolemia, and Hypertension.    PAST SURGICAL HISTORY:  Past Surgical History:  Procedure Laterality Date  . CATARACT EXTRACTION W/PHACO Left 12/03/2017   Procedure: CATARACT EXTRACTION PHACO AND INTRAOCULAR  LENS PLACEMENT ([place]) COMPLICATED DIABETIC LEFT;  Surgeon: Leandrew Koyanagi, MD;  Location: [place];  Service: Ophthalmology;  Laterality: Left;  Diabetic - oral meds  . CHOLECYSTECTOMY      FAMILY HISTORY: family history includes Cancer in his mother.  SOCIAL HISTORY:  reports that he quit smoking about 35 years ago. He has never used smokeless tobacco. He reports previous alcohol use.  ALLERGIES: Patient has no known allergies.  MEDICATIONS:  Current Outpatient Medications  Medication Sig Dispense Refill  . brimonidine-timolol (COMBIGAN) 0.2-0.5 % ophthalmic solution Place 1 drop into the left eye daily.    . citalopram (CELEXA) 10 MG tablet Take 10 mg by mouth daily.    Marland Kitchen glipiZIDE (GLUCOTROL XL) 5 MG 24 hr tablet Take 5 mg by mouth daily.  1  . glucose blood test strip CHECK FASTING BLOOD SUGAR EVERY MORNING. (NEED SIGNATURE)    . losartan-hydrochlorothiazide (HYZAAR) 50-12.5 MG tablet Take 1 tablet by mouth daily.    . metFORMIN (GLUCOPHAGE-XR) 500 MG 24 hr tablet Take 500 mg by mouth 2 (two) times daily.    . pantoprazole (PROTONIX) 40 MG tablet Take 40 mg by mouth daily.    . simvastatin (ZOCOR) 40 MG tablet Take by mouth.    . ALPRAZolam (XANAX) 1 MG tablet Take 1 tablet (1 mg total) by mouth daily as needed for anxiety (Take 1 hour before PET scan). 5 tablet 0  . aspirin EC 81 MG tablet Take by mouth. (Patient not taking: Reported on 10/17/2020)    . esomeprazole (NEXIUM) 40 MG capsule Take 40 mg by  mouth as needed. (Patient not taking: Reported on 10/17/2020)    . ezetimibe-simvastatin (VYTORIN) 10-20 MG tablet Take 1 tablet by mouth daily at 6 PM. (Patient not taking: Reported on 10/17/2020)     No current facility-administered medications for this encounter.    ECOG PERFORMANCE STATUS:  1 - Symptomatic but completely ambulatory  REVIEW OF SYSTEMS: Patient denies any weight loss, fatigue, weakness, fever, chills or night sweats. Patient denies any loss of  vision, blurred vision. Patient denies any ringing  of the ears or hearing loss. No irregular heartbeat. Patient denies heart murmur or history of fainting. Patient denies any chest pain or pain radiating to her upper extremities. Patient denies any shortness of breath, difficulty breathing at night, cough or hemoptysis. Patient denies any swelling in the lower legs. Patient denies any nausea vomiting, vomiting of blood, or coffee ground material in the vomitus. Patient denies any stomach pain. Patient states has had normal bowel movements no significant constipation or diarrhea. Patient denies any dysuria, hematuria or significant nocturia. Patient denies any problems walking, swelling in the joints or loss of balance. Patient denies any skin changes, loss of hair or loss of weight. Patient denies any excessive worrying or anxiety or significant depression. Patient denies any problems with insomnia. Patient denies excessive thirst, polyuria, polydipsia. Patient denies any swollen glands, patient denies easy bruising or easy bleeding. Patient denies any recent infections, allergies or URI. Patient "s visual fields have not changed significantly in recent time.   PHYSICAL EXAM: BP (!) 155/71 (BP Location: Right Arm, Patient Position: Sitting, Cuff Size: Large)   Pulse 63   Temp (!) 97.3 F (36.3 C) (Tympanic)   Resp 16   Wt 212 lb 11.2 oz (96.5 kg)   BMI 30.52 kg/m  Patient has a fixed approximate 4 cm mass in the right subback digastric region no other evidence of cervical adenopathy is appreciated.  Well-developed well-nourished patient in NAD. HEENT reveals PERLA, EOMI, discs not visualized.  Oral cavity is clear. No oral mucosal lesions are identified. Neck is clear without evidence of cervical or supraclavicular adenopathy. Lungs are clear to A&P. Cardiac examination is essentially unremarkable with regular rate and rhythm without murmur rub or thrill. Abdomen is benign with no organomegaly or masses  noted. Motor sensory and DTR levels are equal and symmetric in the upper and lower extremities. Cranial nerves II through XII are grossly intact. Proprioception is intact. No peripheral adenopathy or edema is identified. No motor or sensory levels are noted. Crude visual fields are within normal range.  LABORATORY DATA: Pathology report reviewed    RADIOLOGY RESULTS: CT scan reviewed PET CT scan ordered   IMPRESSION: Stage III squamous cell carcinoma the base of tongue in 74 year old male  PLAN: At this time I have recommended concurrent chemoradiation therapy.  Because of his claustrophobia i he has refused PET CT scan.  I am prescribing him 1 mg of Xanax and have asked him to attempt to have a PET CT scan since it is significantly important in her treatment planning.  Even if he cannot get PET CT scan would proceed with IMRT radiation therapy to base of tongue and conglomerate of metastatic disease in his right cervical chain up to [place].  We also treat his remaining neck nodes to [address] using IMRT dose painting technique.  I would choose IMRT to spare critical structures such as the salivary glands oropharynx spinal cord.  Risks and benefits of treatment including's mucositis skin reaction fatigue  alteration blood counts loss of taste xerostomia all reviewed in detail with the patient and his wife.  They both comprehend my treatment plan well.  I have set him up for CT simulation next week his PET CT scan hopefully will be able to be accomplished this week.  Again patient was prescribed Xanax for his PET/CT.  I would like to take this opportunity to thank you for allowing me to participate in the care of your patient.Noreene Filbert, MD

## 2020-10-23 NOTE — Telephone Encounter (Signed)
I called Patient  To let him know do not take Xanax and Ativan at the same time. Please pick one or there other. I left a voicemail stating what to do.

## 2020-10-23 NOTE — H&P (View-Only) (Signed)
NEW PATIENT EVALUATION  Name: Sean Garner.  MRN: [id number]  Date:   10/23/2020     DOB: 1946-05-17   This 74 y.o. 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  REFERRING PHYSICIAN: Derinda Late, MD  CHIEF COMPLAINT:  Chief Complaint  Patient presents with  . Basal Cell Carcinoma of the tongue    DIAGNOSIS: The encounter diagnosis was Carcinoma of base of tongue ([place]).   PREVIOUS INVESTIGATIONS:  CT scan reviewed Pathology report reviewed Clinical notes reviewed  HPI: Patient is a 74 year old male who presented with an increasing mass in his right subdigastric region.  Mass originally presented with 5 cm.  He had had its neck ultrasound November 8 showing a 3 x 8 0.5 x 3 x 4.8 cm right neck mass consistent with enlarged lymph nodes.  There is also a 1.4 cm enhancing mass in the right base of tongue.  Patient underwent right cervical node biopsy which was positive for p16 positive squamous cell carcinoma.  He is fairly asymptomatic specifically denies dysphagia or head and neck pain.  He is extremely claustrophobic and has refused PET/CT at this point.  CT scan did demonstrate a right neck mass most compatible with a large lymph node metastasis and 1.4 cm Hansen mass in the right base of tongue.  He has been seen by medical oncology and is now referred to radiation oncology for opinion.  PLANNED TREATMENT REGIMEN: Concurrent chemoradiation  PAST MEDICAL HISTORY:  has a past medical history of Anxiety, Claustrophobia, Diabetes mellitus without complication ([place]), GERD (gastroesophageal reflux disease), Hypercholesterolemia, and Hypertension.    PAST SURGICAL HISTORY:  Past Surgical History:  Procedure Laterality Date  . CATARACT EXTRACTION W/PHACO Left 12/03/2017   Procedure: CATARACT EXTRACTION PHACO AND INTRAOCULAR  LENS PLACEMENT ([place]) COMPLICATED DIABETIC LEFT;  Surgeon: Leandrew Koyanagi, MD;  Location: [facility];  Service: Ophthalmology;  Laterality: Left;  Diabetic - oral meds  . CHOLECYSTECTOMY      FAMILY HISTORY: family history includes Cancer in his mother.  SOCIAL HISTORY:  reports that he quit smoking about 35 years ago. He has never used smokeless tobacco. He reports previous alcohol use.  ALLERGIES: Patient has no known allergies.  MEDICATIONS:  Current Outpatient Medications  Medication Sig Dispense Refill  . brimonidine-timolol (COMBIGAN) 0.2-0.5 % ophthalmic solution Place 1 drop into the left eye daily.    . citalopram (CELEXA) 10 MG tablet Take 10 mg by mouth daily.    Marland Kitchen glipiZIDE (GLUCOTROL XL) 5 MG 24 hr tablet Take 5 mg by mouth daily.  1  . glucose blood test strip CHECK FASTING BLOOD SUGAR EVERY MORNING. (NEED SIGNATURE)    . losartan-hydrochlorothiazide (HYZAAR) 50-12.5 MG tablet Take 1 tablet by mouth daily.    . metFORMIN (GLUCOPHAGE-XR) 500 MG 24 hr tablet Take 500 mg by mouth 2 (two) times daily.    . pantoprazole (PROTONIX) 40 MG tablet Take 40 mg by mouth daily.    . simvastatin (ZOCOR) 40 MG tablet Take by mouth.    . ALPRAZolam (XANAX) 1 MG tablet Take 1 tablet (1 mg total) by mouth daily as needed for anxiety (Take 1 hour before PET scan). 5 tablet 0  . aspirin EC 81 MG tablet Take by mouth. (Patient not taking: Reported on 10/17/2020)    . esomeprazole (NEXIUM) 40 MG capsule Take 40 mg by  mouth as needed. (Patient not taking: Reported on 10/17/2020)    . ezetimibe-simvastatin (VYTORIN) 10-20 MG tablet Take 1 tablet by mouth daily at 6 PM. (Patient not taking: Reported on 10/17/2020)     No current facility-administered medications for this encounter.    ECOG PERFORMANCE STATUS:  1 - Symptomatic but completely ambulatory  REVIEW OF SYSTEMS: Patient denies any weight loss, fatigue, weakness, fever, chills or night sweats. Patient denies any loss of  vision, blurred vision. Patient denies any ringing  of the ears or hearing loss. No irregular heartbeat. Patient denies heart murmur or history of fainting. Patient denies any chest pain or pain radiating to her upper extremities. Patient denies any shortness of breath, difficulty breathing at night, cough or hemoptysis. Patient denies any swelling in the lower legs. Patient denies any nausea vomiting, vomiting of blood, or coffee ground material in the vomitus. Patient denies any stomach pain. Patient states has had normal bowel movements no significant constipation or diarrhea. Patient denies any dysuria, hematuria or significant nocturia. Patient denies any problems walking, swelling in the joints or loss of balance. Patient denies any skin changes, loss of hair or loss of weight. Patient denies any excessive worrying or anxiety or significant depression. Patient denies any problems with insomnia. Patient denies excessive thirst, polyuria, polydipsia. Patient denies any swollen glands, patient denies easy bruising or easy bleeding. Patient denies any recent infections, allergies or URI. Patient "s visual fields have not changed significantly in recent time.   PHYSICAL EXAM: BP (!) 155/71 (BP Location: Right Arm, Patient Position: Sitting, Cuff Size: Large)   Pulse 63   Temp (!) 97.3 F (36.3 C) (Tympanic)   Resp 16   Wt 212 lb 11.2 oz (96.5 kg)   BMI 30.52 kg/m  Patient has a fixed approximate 4 cm mass in the right subback digastric region no other evidence of cervical adenopathy is appreciated.  Well-developed well-nourished patient in NAD. HEENT reveals PERLA, EOMI, discs not visualized.  Oral cavity is clear. No oral mucosal lesions are identified. Neck is clear without evidence of cervical or supraclavicular adenopathy. Lungs are clear to A&P. Cardiac examination is essentially unremarkable with regular rate and rhythm without murmur rub or thrill. Abdomen is benign with no organomegaly or masses  noted. Motor sensory and DTR levels are equal and symmetric in the upper and lower extremities. Cranial nerves II through XII are grossly intact. Proprioception is intact. No peripheral adenopathy or edema is identified. No motor or sensory levels are noted. Crude visual fields are within normal range.  LABORATORY DATA: Pathology report reviewed    RADIOLOGY RESULTS: CT scan reviewed PET CT scan ordered   IMPRESSION: Stage III squamous cell carcinoma the base of tongue in 74 year old male  PLAN: At this time I have recommended concurrent chemoradiation therapy.  Because of his claustrophobia i he has refused PET CT scan.  I am prescribing him 1 mg of Xanax and have asked him to attempt to have a PET CT scan since it is significantly important in her treatment planning.  Even if he cannot get PET CT scan would proceed with IMRT radiation therapy to base of tongue and conglomerate of metastatic disease in his right cervical chain up to [place].  We also treat his remaining neck nodes to [address] using IMRT dose painting technique.  I would choose IMRT to spare critical structures such as the salivary glands oropharynx spinal cord.  Risks and benefits of treatment including's mucositis skin reaction fatigue  alteration blood counts loss of taste xerostomia all reviewed in detail with the patient and his wife.  They both comprehend my treatment plan well.  I have set him up for CT simulation next week his PET CT scan hopefully will be able to be accomplished this week.  Again patient was prescribed Xanax for his PET/CT.  I would like to take this opportunity to thank you for allowing me to participate in the care of your patient.Noreene Filbert, MD

## 2020-10-23 NOTE — Telephone Encounter (Signed)
I spoke with patient regarding his appointment for his hearing test. 10/23/2020 @11  am

## 2020-10-24 ENCOUNTER — Inpatient Hospital Stay (HOSPITAL_BASED_OUTPATIENT_CLINIC_OR_DEPARTMENT_OTHER): Payer: Medicare Other | Admitting: Nurse Practitioner

## 2020-10-24 ENCOUNTER — Other Ambulatory Visit: Payer: Self-pay | Admitting: Hematology and Oncology

## 2020-10-24 ENCOUNTER — Inpatient Hospital Stay: Payer: Medicare Other | Attending: Nurse Practitioner

## 2020-10-24 ENCOUNTER — Encounter: Payer: Self-pay | Admitting: Hematology and Oncology

## 2020-10-24 DIAGNOSIS — C01 Malignant neoplasm of base of tongue: Secondary | ICD-10-CM

## 2020-10-24 DIAGNOSIS — F4312 Post-traumatic stress disorder, chronic: Secondary | ICD-10-CM | POA: Diagnosis not present

## 2020-10-24 DIAGNOSIS — F4310 Post-traumatic stress disorder, unspecified: Secondary | ICD-10-CM | POA: Insufficient documentation

## 2020-10-24 DIAGNOSIS — F40240 Claustrophobia: Secondary | ICD-10-CM | POA: Insufficient documentation

## 2020-10-24 DIAGNOSIS — C770 Secondary and unspecified malignant neoplasm of lymph nodes of head, face and neck: Secondary | ICD-10-CM | POA: Insufficient documentation

## 2020-10-24 DIAGNOSIS — C160 Malignant neoplasm of cardia: Secondary | ICD-10-CM | POA: Insufficient documentation

## 2020-10-24 DIAGNOSIS — F419 Anxiety disorder, unspecified: Secondary | ICD-10-CM | POA: Insufficient documentation

## 2020-10-24 DIAGNOSIS — Z87891 Personal history of nicotine dependence: Secondary | ICD-10-CM | POA: Insufficient documentation

## 2020-10-24 MED ORDER — LIDOCAINE-PRILOCAINE 2.5-2.5 % EX CREA: TOPICAL_CREAM | CUTANEOUS | 3 refills | Status: DC

## 2020-10-24 MED ORDER — ONDANSETRON HCL 8 MG PO TABS: 8.0000 mg | ORAL_TABLET | Freq: Two times a day (BID) | ORAL | 1 refills | Status: DC | PRN

## 2020-10-24 MED ORDER — PROCHLORPERAZINE MALEATE 10 MG PO TABS: 10.0000 mg | ORAL_TABLET | Freq: Four times a day (QID) | ORAL | 1 refills | Status: DC | PRN

## 2020-10-24 NOTE — Progress Notes (Signed)
START ON PATHWAY REGIMEN - Head and Neck     A cycle is every 7 days:     Cisplatin   **Always confirm dose/schedule in your pharmacy ordering system**  Patient Characteristics: Oropharynx, HPV Positive, Preoperative or Nonsurgical Candidate (Clinical Staging), cT0-4, cN1-3 or cT3-4, cN0 Disease Classification: Oropharynx HPV Status: Positive (+) Therapeutic Status: Preoperative or Nonsurgical Candidate (Clinical Staging) AJCC T Category: cT1 AJCC 8 Stage Grouping: I AJCC N Category: cN1 AJCC M Category: cM0 Intent of Therapy: Curative Intent, Discussed with Patient 

## 2020-10-24 NOTE — Progress Notes (Signed)
Virtual Visit Progress Note  [place] NOTE [facility]  Telephone:([phone] [phone]  Patient Care Team: Derinda Late, MD as PCP - General (Family Medicine) Lequita Asal, MD as Referring Physician (Hematology and Oncology) Beverly Gust, MD (Otolaryngology)   Name of the patient: Sean Garner  [id number]  07-01-1946   Date of visit: 10/24/20  I connected with Sean Garner. on 10/24/20 at 11:15 AM EST by telephone visit and verified that I am speaking with the correct person using two identifiers.   I discussed the limitations, risks, security and privacy concerns of performing an evaluation and management service by telemedicine and the availability of in-person appointments. I also discussed with the patient that there may be a patient responsible charge related to this service. The patient expressed understanding and agreed to proceed.   Other persons participating in the visit and their role in the encounter: Sean Garner, Therapist, sports (Nurse Navigator & Chemo Education)  Patient's location: home Provider's location: clinic  Diagnosis-cancer of the base of tongue  Chief complaint/Reason for visit- Initial Meeting for [facility], preparing for starting chemotherapy  Heme/Onc history:  Oncology History  Carcinoma of base of tongue ([place])  10/17/2020 Initial Diagnosis   Carcinoma of base of tongue ([place])   10/30/2020 -  Chemotherapy   The patient had dexamethasone (DECADRON) 4 MG tablet, 8 mg, Oral, Daily, 1 of 1 cycle, Start date: --, End date: -- palonosetron (ALOXI) injection 0.25 mg, 0.25 mg, Intravenous,  Once, 0 of 7 cycles CISplatin (PLATINOL) 87 mg in sodium chloride 0.9 % 250 mL chemo infusion, 40 mg/m2 = 87 mg, Intravenous,  Once, 0 of 7 cycles fosaprepitant (EMEND) 150 mg in sodium chloride 0.9 % 145 mL IVPB, 150 mg, Intravenous,  Once, 0 of 7 cycles  for chemotherapy treatment.      Interval history-   Sean Garner., 74 year old male, who presents to chemo care clinic today for initial meeting in preparation for starting chemotherapy. I introduced the chemo care clinic and we discussed that the role of the clinic is to assist those who are at an increased risk of emergency room visits and/or complications during the course of chemotherapy treatment. We discussed that the increased risk takes into account factors such as age, performance status, and co-morbidities. We also discussed that for some, this might include barriers to care such as not having a primary care provider, lack of insurance/transportation, or not being able to afford medications. We discussed that the goal of the program is to help prevent unplanned ER visits and help reduce complications during chemotherapy. We do this by discussing specific risk factors to each individual and identifying ways that we can help improve these risk factors and reduce barriers to care.  Patient says he has significant anxiety in anticipation of upcoming PET scan and he is considering canceling.  He has a history of PTSD and does not feel that he can go through the PET scan without sedation.  He was prescribed medication by Dr. Baruch Gouty earlier today.  ECOG FS:1 - Symptomatic but completely ambulatory  Review of systems- Review of Systems  Constitutional: Negative for chills, fever, malaise/fatigue and weight loss.  HENT: Negative for hearing loss, nosebleeds, sore throat and tinnitus.   Eyes: Negative for blurred vision and double vision.  Respiratory: Negative for cough, hemoptysis, shortness of breath and wheezing.   Cardiovascular: Negative for chest pain, palpitations and leg swelling.  Gastrointestinal: Negative  for abdominal pain, blood in stool, constipation, diarrhea, melena, nausea and vomiting.  Genitourinary: Negative for dysuria and urgency.  Musculoskeletal: Negative for back pain, falls, joint pain and myalgias.  Skin: Negative for  itching and rash.  Neurological: Negative for dizziness, tingling, sensory change, loss of consciousness, weakness and headaches.  Endo/Heme/Allergies: Negative for environmental allergies. Does not bruise/bleed easily.  Psychiatric/Behavioral: Negative for depression. The patient is nervous/anxious. The patient does not have insomnia.     No Known Allergies  Past Medical History:  Diagnosis Date  . Anxiety   . Claustrophobia   . Diabetes mellitus without complication ([place])   . GERD (gastroesophageal reflux disease)   . Hypercholesterolemia   . Hypertension     Past Surgical History:  Procedure Laterality Date  . CATARACT EXTRACTION W/PHACO Left 12/03/2017   Procedure: CATARACT EXTRACTION PHACO AND INTRAOCULAR LENS PLACEMENT ([place]) COMPLICATED DIABETIC LEFT;  Surgeon: Leandrew Koyanagi, MD;  Location: [place];  Service: Ophthalmology;  Laterality: Left;  Diabetic - oral meds  . CHOLECYSTECTOMY      Social History   Socioeconomic History  . Marital status: Married    Spouse name: Not on file  . Number of children: Not on file  . Years of education: Not on file  . Highest education level: Not on file  Occupational History  . Not on file  Tobacco Use  . Smoking status: Former Smoker    Quit date: 1986    Years since quitting: 35.9  . Smokeless tobacco: Never Used  Vaping Use  . Vaping Use: Never used  Substance and Sexual Activity  . Alcohol use: Not Currently    Comment: occasional - 1 glass wine/month  . Drug use: Not on file  . Sexual activity: Not on file  Other Topics Concern  . Not on file  Social History Narrative  . Not on file   Social Determinants of Health   Financial Resource Strain:   . Difficulty of Paying Living Expenses: Not on file  Food Insecurity:   . Worried About Charity fundraiser in the Last Year: Not on file  . Ran Out of Food in the Last Year: Not on file  Transportation Needs:   . Lack of Transportation (Medical): Not on  file  . Lack of Transportation (Non-Medical): Not on file  Physical Activity:   . Days of Exercise per Week: Not on file  . Minutes of Exercise per Session: Not on file  Stress:   . Feeling of Stress : Not on file  Social Connections:   . Frequency of Communication with Friends and Family: Not on file  . Frequency of Social Gatherings with Friends and Family: Not on file  . Attends Religious Services: Not on file  . Active Member of Clubs or Organizations: Not on file  . Attends Archivist Meetings: Not on file  . Marital Status: Not on file  Intimate Partner Violence:   . Fear of Current or Ex-Partner: Not on file  . Emotionally Abused: Not on file  . Physically Abused: Not on file  . Sexually Abused: Not on file    Family History  Problem Relation Age of Onset  . Cancer Mother    Immunization History  Administered Date(s) Administered  . Influenza, High Dose Seasonal PF 08/19/2018  . Influenza-Unspecified 08/12/2014, 09/05/2015, 07/12/2016, 08/30/2020  . PFIZER SARS-COV-2 Vaccination 12/08/2019, 01/31/2020, 09/11/2020  . Pneumococcal Conjugate-13 11/05/2016     Current Outpatient Medications:  .  ALPRAZolam Duanne Moron)  1 MG tablet, Take 1 tablet (1 mg total) by mouth daily as needed for anxiety (Take 1 hour before PET scan)., Disp: 5 tablet, Rfl: 0 .  aspirin EC 81 MG tablet, Take by mouth. (Patient not taking: Reported on 10/17/2020), Disp: , Rfl:  .  brimonidine-timolol (COMBIGAN) 0.2-0.5 % ophthalmic solution, Place 1 drop into the left eye daily., Disp: , Rfl:  .  citalopram (CELEXA) 10 MG tablet, Take 10 mg by mouth daily., Disp: , Rfl:  .  esomeprazole (NEXIUM) 40 MG capsule, Take 40 mg by mouth as needed. (Patient not taking: Reported on 10/17/2020), Disp: , Rfl:  .  ezetimibe-simvastatin (VYTORIN) 10-20 MG tablet, Take 1 tablet by mouth daily at 6 PM. (Patient not taking: Reported on 10/17/2020), Disp: , Rfl:  .  glipiZIDE (GLUCOTROL XL) 5 MG 24 hr tablet,  Take 5 mg by mouth daily., Disp: , Rfl: 1 .  glucose blood test strip, CHECK FASTING BLOOD SUGAR EVERY MORNING. (NEED SIGNATURE), Disp: , Rfl:  .  lidocaine-prilocaine (EMLA) cream, Apply to affected area once, Disp: 30 g, Rfl: 3 .  losartan-hydrochlorothiazide (HYZAAR) 50-12.5 MG tablet, Take 1 tablet by mouth daily., Disp: , Rfl:  .  metFORMIN (GLUCOPHAGE-XR) 500 MG 24 hr tablet, Take 500 mg by mouth 2 (two) times daily., Disp: , Rfl:  .  ondansetron (ZOFRAN) 8 MG tablet, Take 1 tablet (8 mg total) by mouth 2 (two) times daily as needed. Start on the third day after cisplatin chemotherapy., Disp: 30 tablet, Rfl: 1 .  pantoprazole (PROTONIX) 40 MG tablet, Take 40 mg by mouth daily., Disp: , Rfl:  .  prochlorperazine (COMPAZINE) 10 MG tablet, Take 1 tablet (10 mg total) by mouth every 6 (six) hours as needed (Nausea or vomiting)., Disp: 30 tablet, Rfl: 1 .  simvastatin (ZOCOR) 40 MG tablet, Take by mouth., Disp: , Rfl:   Physical exam: There were no vitals filed for this visit. Physical Exam Neurological:     Mental Status: He is alert.  Psychiatric:        Attention and Perception: Attention normal.        Mood and Affect: Mood is anxious (VERY).        Speech: Speech is rapid and pressured.        Behavior: Behavior is hyperactive. Behavior is cooperative.        Thought Content: Thought content normal.        Cognition and Memory: Cognition and memory normal.        Judgment: Judgment is not impulsive.      CMP Latest Ref Rng & Units 10/17/2020  Glucose 70 - 99 mg/dL 175(H)  BUN 8 - 23 mg/dL 16  Creatinine 0.61 - 1.24 mg/dL 0.79  Sodium 135 - 145 mmol/L 136  Potassium 3.5 - 5.1 mmol/L 4.0  Chloride 98 - 111 mmol/L 96(L)  CO2 22 - 32 mmol/L 29  Calcium 8.9 - 10.3 mg/dL 9.7  Total Protein 6.5 - 8.1 g/dL 7.8  Total Bilirubin 0.3 - 1.2 mg/dL 0.4  Alkaline Phos 38 - 126 U/L 58  AST 15 - 41 U/L 21  ALT 0 - 44 U/L 22   CBC Latest Ref Rng & Units 10/17/2020  WBC 4.0 - 10.5 K/uL  8.4  Hemoglobin 13.0 - 17.0 g/dL 15.5  Hematocrit 39 - 52 % 44.9  Platelets 150 - 400 K/uL 295    No images are attached to the encounter.  CT SOFT TISSUE NECK W CONTRAST  Result  Date: 09/28/2020 CLINICAL DATA:  Right neck mass EXAM: CT NECK WITH CONTRAST TECHNIQUE: Multidetector CT imaging of the neck was performed using the standard protocol following the bolus administration of intravenous contrast. CONTRAST:  28mL OMNIPAQUE IOHEXOL 300 MG/ML  SOLN COMPARISON:  Ultrasound neck 09/25/2020 FINDINGS: Pharynx and larynx: Asymmetric enlargement of the right lingual tonsil suspicious for neoplasm. This shows homogeneous enhancement and measures approximately 14 mm. Salivary glands: No inflammation, mass, or stone. Thyroid: Negative Lymph nodes: Soft tissue mass right neck deep to the right sternocleidomastoid muscle and lateral to the carotid artery and jugular vein. This appears to be a solid mass. Possible slight vascular enhancement or calcification inferiorly. The mass measures approximately 3.5 x 3.0 x 4.8 cm. Probable enlarged lymph node mass. No other enlarged lymph nodes. Vascular: Right jugular vein is compressed and displaced medially by the mass but no thrombus or occlusion. Left jugular vein widely patent. Both carotid arteries patent. Limited intracranial: Negative Visualized orbits: No orbital lesion.  Left cataract extraction. Mastoids and visualized paranasal sinuses: Mucosal edema sphenoid sinus. Remaining paranasal sinuses clear. Visualized mastoid sinus clear bilaterally. Skeleton: Cervical spondylosis.  No acute skeletal abnormality. Upper chest: Lung apices clear bilaterally. Other: None IMPRESSION: Right neck mass most compatible with enlarged lymph node mass. Probable metastatic lymph node. 14 mm enhancing mass in the right base of tongue. Recommend biopsy to rule out squamous cell carcinoma. Electronically Signed   By: Franchot Gallo M.D.   On: 09/28/2020 09:54   US SOFT TISSUE HEAD  & NECK (NON-THYROID)  Result Date: 09/25/2020 CLINICAL DATA:  74 year old male with a history of neck mass EXAM: ULTRASOUND OF HEAD/NECK SOFT TISSUES TECHNIQUE: Ultrasound examination of the head and neck soft tissues was performed in the area of clinical concern. COMPARISON:  None. FINDINGS: Grayscale and color duplex performed in the region clinical concern. There is a relatively heterogeneously hypoechoic soft tissue mass within the superficial soft tissues of the neck, 3.3 cm x 2.7 cm x 3.6 cm. Trace internal color flow. Border relatively well-defined overlying the musculature. IMPRESSION: Nonspecific soft tissue mass in the region of clinical concern measuring 3.3 cm. Malignancy cannot be excluded, including pathologic lymph node and correlation with contrast-enhanced neck CT may be useful. Electronically Signed   By: Corrie Mckusick D.O.   On: 09/25/2020 16:39   [place] CORE BIOPSY (LYMPH NODES)  Result Date: 10/11/2020 INDICATION: 74 year old male with right tonsillar mass and suspected metastatic adenopathy in the right cervical chain. EXAM: ULTRASOUND OF THE LYMPH NODES MEDICATIONS: None. ANESTHESIA/SEDATION: None. FLUOROSCOPY TIME:  None. COMPLICATIONS: None immediate. PROCEDURE: Informed written consent was obtained from the patient after a thorough discussion of the procedural risks, benefits and alternatives. All questions were addressed. Maximal Sterile Barrier Technique was utilized including caps, mask, sterile gowns, sterile gloves, sterile drape, hand hygiene and skin antiseptic. A timeout was performed prior to the initiation of the procedure. Ultrasound was used to interrogate the right neck. There is an approximately 4.0 x 2.3 cm hypoechoic mass in the right cervical chain with internal dystrophic calcifications. A suitable skin entry site was selected and marked. The overlying skin was sterilely prepped and draped in the standard fashion using chlorhexidine skin prep. Local anesthesia was  attained by infiltration with 1% lidocaine. A small dermatotomy was made. Under real-time ultrasound guidance, multiple 18 gauge core biopsies were obtained coaxially using the Bard mission automated biopsy device. Biopsy specimens were placed in both formalin and saline and delivered to pathology for further analysis. Post biopsy  ultrasound imaging demonstrates no evidence of immediate complication. IMPRESSION: Ultrasound-guided core biopsy of right cervical lymph node mass. Electronically Signed   By: Jacqulynn Cadet M.D.   On: 10/11/2020 14:13     Assessment and plan- Patient is a 74 y.o. 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    1. Stage III SCC of Tongue Base-  Plan for concurrent chemo-radiation. NCCN guidelines were reviewed and current management plan aligns with guidelines.   2. Chemo Care Clinic/High Risk for ER/Hospitalization during chemotherapy- We discussed the role of the chemo care clinic and identified patient specific risk factors. I discussed that patient was identified as low risk primarily based on age.   3. Social Determinants of Health- we discussed that social determinants of health may have significant impacts on health and outcomes for cancer patients.  Today we discussed specific social determinants of performance status, alcohol use, depression, financial needs, food insecurity, housing, interpersonal violence, social connections, stress, tobacco use, and transportation.  After lengthy discussion he denies specific areas of need today.   4. Anxiety & Claustrophobia- advised patient to take xanax today due to significant anxiety prior to pet scan. He has history of PTSD. Also advised to take xanax 1mg  2 hours prior to PET scan and an addition 1mg  one hour prior to pet scan if needed. Suspect he would benefit from ongoing management of anxiety versus  management plan for prn dosing of anti-anxiolytic with anticipated frequent imaging.   5. Palliative Care- based on stage of cancer and/or identified needs today, I will refer patient to palliative care for goals of care and advanced care planning as well as ongoing management of anxiety. Also consider evaluation with speech therapy either now or in the future as he progresses through treatment.   6. Nutrition- referral to Jennet Maduro, RD to monitor weight and trends. Encouraged protein consumption  We also discussed the role of the Symptom Management Clinic at [facility] for acute issues and methods of contacting clinic/provider. He denies needing other specific assistance at this time   Return to clinic as scheduled  Visit Diagnosis 1. Carcinoma of base of tongue ([place])      I discussed the assessment and treatment plan with the patient. The patient was provided an opportunity to ask questions and all were answered. The patient agreed with the plan and demonstrated an understanding of the instructions.   The patient was advised to call back or seek an in-person evaluation if the symptoms worsen or if the condition fails to improve as anticipated.   I provided 25 minutes of non face-to-face telephone visit time during this encounter, and > 50% was spent counseling as documented under my assessment & plan.  Beckey Rutter, DNP, AGNP-C [place] [facility] [phone] (clinic)

## 2020-10-25 ENCOUNTER — Ambulatory Visit
Admission: RE | Admit: 2020-10-25 | Discharge: 2020-10-25 | Disposition: A | Discharge status: Home / Self Care | Payer: Medicare Other | Source: Ambulatory Visit | Attending: Hematology and Oncology | Admitting: Hematology and Oncology

## 2020-10-25 ENCOUNTER — Other Ambulatory Visit: Payer: Self-pay

## 2020-10-25 DIAGNOSIS — C01 Malignant neoplasm of base of tongue: Secondary | ICD-10-CM | POA: Diagnosis not present

## 2020-10-25 DIAGNOSIS — K449 Diaphragmatic hernia without obstruction or gangrene: Secondary | ICD-10-CM | POA: Insufficient documentation

## 2020-10-25 DIAGNOSIS — J439 Emphysema, unspecified: Secondary | ICD-10-CM | POA: Diagnosis not present

## 2020-10-25 DIAGNOSIS — I2510 Atherosclerotic heart disease of native coronary artery without angina pectoris: Secondary | ICD-10-CM | POA: Insufficient documentation

## 2020-10-25 DIAGNOSIS — N400 Enlarged prostate without lower urinary tract symptoms: Secondary | ICD-10-CM | POA: Insufficient documentation

## 2020-10-25 DIAGNOSIS — I700 Atherosclerosis of aorta: Secondary | ICD-10-CM | POA: Insufficient documentation

## 2020-10-25 LAB — GLUCOSE, CAPILLARY: Glucose-Capillary: 180 mg/dL — ABNORMAL HIGH (ref 70–99)

## 2020-10-25 MED ORDER — FLUDEOXYGLUCOSE F - 18 (FDG) INJECTION
11.5100 | Freq: Once | INTRAVENOUS | Status: AC | PRN
  Administered 2020-10-25: 11.51 via INTRAVENOUS

## 2020-10-27 ENCOUNTER — Telehealth: Payer: Self-pay | Admitting: Hematology and Oncology

## 2020-10-27 ENCOUNTER — Telehealth: Payer: Self-pay | Admitting: Internal Medicine

## 2020-10-27 NOTE — Telephone Encounter (Signed)
Dr. Trenton Founds Oncologist from [facility] states the pt knows Dr Hilarie Fredrickson and would like to know if he could do an EGD on him sometime next week prior to his chemotherapy. Pt is new.  CB [phone]

## 2020-10-27 NOTE — Telephone Encounter (Signed)
Re:  PET scan results  I reviewed the PET scan results with the patient.  The base of tongue lesion and known lymph node were noted. There was no evidence of metastasis.  He has an unknown lesion at the distal esophagus.  He stated that he has reflux. I discuss EGD prior to initiation of treatment.  He previously saw Dr Vira Agar or Dr Candace Cruise at the [facility]. He requested that I contact Dr Hilarie Fredrickson [phone]) in [place].  Message left.  A few questions were asked and answered.   Lequita Asal, MD

## 2020-10-27 NOTE — Telephone Encounter (Signed)
Pt has cancer on the base of the tongue. PET scan showed lesion in the distal esophagus. Pt states he has had history of reflux. Oncologist wants EGD prior to starting chemo and radiation to look at this area. Pt scheduled for previsit 10/31/20@2 :30pm. EGD scheduled in the [place] 11/03/20@9 :30am. Pt aware of appts.

## 2020-10-30 ENCOUNTER — Ambulatory Visit
Admission: RE | Admit: 2020-10-30 | Discharge: 2020-10-30 | Disposition: A | Discharge status: Home / Self Care | Payer: Medicare Other | Source: Ambulatory Visit | Attending: Radiation Oncology | Admitting: Radiation Oncology

## 2020-10-30 ENCOUNTER — Other Ambulatory Visit: Payer: Self-pay | Admitting: Nurse Practitioner

## 2020-10-30 ENCOUNTER — Other Ambulatory Visit: Payer: Self-pay | Admitting: *Deleted

## 2020-10-30 ENCOUNTER — Ambulatory Visit: Payer: Medicare Other

## 2020-10-30 ENCOUNTER — Telehealth: Payer: Self-pay | Admitting: *Deleted

## 2020-10-30 DIAGNOSIS — C01 Malignant neoplasm of base of tongue: Secondary | ICD-10-CM | POA: Insufficient documentation

## 2020-10-30 DIAGNOSIS — Z510 Encounter for antineoplastic radiation therapy: Secondary | ICD-10-CM | POA: Insufficient documentation

## 2020-10-30 MED ORDER — ALPRAZOLAM 1 MG PO TABS: 1.0000 mg | ORAL_TABLET | Freq: Three times a day (TID) | ORAL | 0 refills | Status: DC | PRN

## 2020-10-30 NOTE — Telephone Encounter (Signed)
Wife called requesting a return call to discuss getting som,ething on a daily basis for his anxiety, Per earlier note form Dr Mike Gip, patient was going to discuss this with Dr Baruch Gouty at his appointment today,  But Dr Baruch Gouty only prescribed 5 xanax tabs and did not want to order anything long term for him and told him to discuss with Symptom Management Clinic per [place]. Of note, he took 3 xanax tabs before his appointment with radiation therapy this afternoon. Please return her call [phone]

## 2020-10-30 NOTE — Telephone Encounter (Signed)
I'll refill the xanax and can we set him up to see Josh for ongoing management, one day this week, please? Thanks!

## 2020-10-30 NOTE — Telephone Encounter (Signed)
Patient called requesting medicine for anxiety and claustrophobia to be sent to [facility] on [address] prior to his appointment today for Simulation @ 1PM. He would like to have it for more than todays visit as well. Please call patient when this is sent [phone]

## 2020-10-30 NOTE — Telephone Encounter (Signed)
I scheduled him on Thursday after CT sim appt w/ dr Baruch Gouty.

## 2020-10-30 NOTE — Telephone Encounter (Signed)
  Patient's wife texted me.  They will discuss with Dr Baruch Gouty.  Jerilynn Mages

## 2020-10-31 ENCOUNTER — Ambulatory Visit (AMBULATORY_SURGERY_CENTER): Payer: Self-pay

## 2020-10-31 ENCOUNTER — Telehealth: Payer: Self-pay | Admitting: *Deleted

## 2020-10-31 ENCOUNTER — Other Ambulatory Visit: Payer: Self-pay

## 2020-10-31 VITALS — Ht 69.0 in | Wt 211.0 lb

## 2020-10-31 DIAGNOSIS — R948 Abnormal results of function studies of other organs and systems: Secondary | ICD-10-CM

## 2020-10-31 NOTE — Telephone Encounter (Signed)
Pharmacy called stating that [employer] needs a prior authorization done and to call them when approved

## 2020-10-31 NOTE — Progress Notes (Signed)
No allergies to soy or egg Pt is not on blood thinners or diet pills Denies issues with sedation/intubation Denies atrial flutter/fib Denies constipation   Pt is aware of Covid safety and care partner requirements.  Wife present for intervene

## 2020-11-02 ENCOUNTER — Inpatient Hospital Stay (HOSPITAL_BASED_OUTPATIENT_CLINIC_OR_DEPARTMENT_OTHER): Payer: Medicare Other | Admitting: Hospice and Palliative Medicine

## 2020-11-02 ENCOUNTER — Other Ambulatory Visit: Payer: Self-pay

## 2020-11-02 ENCOUNTER — Ambulatory Visit
Admission: RE | Admit: 2020-11-02 | Discharge: 2020-11-02 | Disposition: A | Discharge status: Home / Self Care | Payer: Medicare Other | Source: Ambulatory Visit | Attending: Radiation Oncology | Admitting: Radiation Oncology

## 2020-11-02 DIAGNOSIS — C01 Malignant neoplasm of base of tongue: Secondary | ICD-10-CM

## 2020-11-02 DIAGNOSIS — F40240 Claustrophobia: Secondary | ICD-10-CM | POA: Diagnosis not present

## 2020-11-02 DIAGNOSIS — F4312 Post-traumatic stress disorder, chronic: Secondary | ICD-10-CM

## 2020-11-02 DIAGNOSIS — F419 Anxiety disorder, unspecified: Secondary | ICD-10-CM

## 2020-11-02 DIAGNOSIS — C160 Malignant neoplasm of cardia: Secondary | ICD-10-CM | POA: Diagnosis not present

## 2020-11-02 DIAGNOSIS — F4310 Post-traumatic stress disorder, unspecified: Secondary | ICD-10-CM | POA: Diagnosis not present

## 2020-11-02 DIAGNOSIS — Z510 Encounter for antineoplastic radiation therapy: Secondary | ICD-10-CM | POA: Diagnosis present

## 2020-11-02 DIAGNOSIS — C770 Secondary and unspecified malignant neoplasm of lymph nodes of head, face and neck: Secondary | ICD-10-CM | POA: Diagnosis not present

## 2020-11-02 DIAGNOSIS — Z87891 Personal history of nicotine dependence: Secondary | ICD-10-CM | POA: Diagnosis not present

## 2020-11-02 MED ORDER — ALPRAZOLAM 0.5 MG PO TABS: 0.5000 mg | ORAL_TABLET | Freq: Three times a day (TID) | ORAL | 0 refills | Status: DC | PRN

## 2020-11-02 MED ORDER — CITALOPRAM HYDROBROMIDE 20 MG PO TABS: 20.0000 mg | ORAL_TABLET | Freq: Every day | ORAL | 3 refills | Status: DC

## 2020-11-02 NOTE — Progress Notes (Signed)
Sean Garner  [phone] [phone]   Name: Sean Garner. Date: 11/02/2020 MRN: [id number]  DOB: Apr 15, 1946  Patient Care Team: Derinda Late, MD as PCP - General (Family Medicine) Lequita Asal, MD as Referring Physician (Hematology and Oncology) Beverly Gust, MD (Otolaryngology) Pyrtle, Lajuan Lines, MD as Consulting Physician (Gastroenterology)    REASON FOR CONSULTATION: Sean Garner. is a 74 y.o. 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  Patient has severe claustrophobia limiting work-up as he refused PET/CT.  Patient has had severe anxiety and was referred to palliative care to help address goals and manage ongoing symptoms.   SOCIAL HISTORY:     reports that he quit smoking about 35 years ago. He has never used smokeless tobacco. He reports previous alcohol use. He reports that he does not use drugs.  Patient is married and lives at home with his wife.  He has a daughter who lives nearby.  Patient is a [place] veteran and has PTSD.  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  ADVANCE DIRECTIVES:  Not on file  CODE STATUS:   PAST MEDICAL HISTORY: Past Medical History:  Diagnosis Date  . Anxiety   . Cancer [facility])    Head/throat Cancer at the base of the tongue  . Cataract    lt eye repair; present in rt eye but not ripe yet.  . Claustrophobia   . Diabetes mellitus without complication ([place])   . GERD (gastroesophageal reflux disease)   . Glaucoma    using gtts for this.  . Hypercholesterolemia   . Hypertension     PAST SURGICAL HISTORY:  Past Surgical History:  Procedure Laterality Date  . CATARACT EXTRACTION W/PHACO Left 12/03/2017   Procedure: CATARACT EXTRACTION PHACO AND INTRAOCULAR LENS PLACEMENT ([place]) COMPLICATED DIABETIC  LEFT;  Surgeon: Leandrew Koyanagi, MD;  Location: [place];  Service: Ophthalmology;  Laterality: Left;  Diabetic - oral meds  . CHOLECYSTECTOMY    . COLONOSCOPY  2016    HEMATOLOGY/ONCOLOGY HISTORY:  Oncology History  Carcinoma of base of tongue ([place])  10/17/2020 Initial Diagnosis   Carcinoma of base of tongue ([place])   10/30/2020 -  Chemotherapy   The patient had dexamethasone (DECADRON) 4 MG tablet, 8 mg, Oral, Daily, 1 of 1 cycle, Start date: --, End date: -- palonosetron (ALOXI) injection 0.25 mg, 0.25 mg, Intravenous,  Once, 0 of 7 cycles CISplatin (PLATINOL) 87 mg in sodium chloride 0.9 % 250 mL chemo infusion, 40 mg/m2 = 87 mg, Intravenous,  Once, 0 of 7 cycles fosaprepitant (EMEND) 150 mg in sodium chloride 0.9 % 145 mL IVPB, 150 mg, Intravenous,  Once, 0 of 7 cycles  for chemotherapy treatment.      ALLERGIES:  has No Known Allergies.  MEDICATIONS:  Current Outpatient Medications  Medication Sig Dispense Refill  . ALPRAZolam (XANAX) 1 MG tablet Take 1 tablet (1 mg total) by mouth 3 (three) times daily as needed for anxiety. 30 tablet 0  . aspirin EC 81 MG tablet Take by mouth.    . brimonidine-timolol (COMBIGAN) 0.2-0.5 % ophthalmic solution Place 1 drop into the left eye daily.    . citalopram (CELEXA) 10 MG tablet Take 10 mg by mouth daily.    . diphenhydrAMINE HCl (BENADRYL PO) Take by mouth.    . esomeprazole (  NEXIUM) 40 MG capsule Take 40 mg by mouth as needed.    . ezetimibe-simvastatin (VYTORIN) 10-20 MG tablet Take 1 tablet by mouth daily at 6 PM.    . glipiZIDE (GLUCOTROL XL) 5 MG 24 hr tablet Take 5 mg by mouth daily.  1  . glucose blood test strip CHECK FASTING BLOOD SUGAR EVERY MORNING. (NEED SIGNATURE)    . lidocaine-prilocaine (EMLA) cream Apply to affected area once 30 g 3  . losartan-hydrochlorothiazide (HYZAAR) 50-12.5 MG tablet Take 1 tablet by mouth daily.    . metFORMIN (GLUCOPHAGE-XR) 500 MG 24 hr tablet Take 500 mg by mouth 2 (two) times  daily.    . ondansetron (ZOFRAN) 8 MG tablet Take 1 tablet (8 mg total) by mouth 2 (two) times daily as needed. Start on the third day after cisplatin chemotherapy. 30 tablet 1  . pantoprazole (PROTONIX) 40 MG tablet Take 40 mg by mouth daily.    . prochlorperazine (COMPAZINE) 10 MG tablet Take 1 tablet (10 mg total) by mouth every 6 (six) hours as needed (Nausea or vomiting). 30 tablet 1  . simvastatin (ZOCOR) 40 MG tablet Take by mouth.     No current facility-administered medications for this visit.    VITAL SIGNS: There were no vitals taken for this visit. There were no vitals filed for this visit.  Estimated body mass index is 31.16 kg/m as calculated from the following:   Height as of 10/31/20: _0  (1.753 m).   Weight as of 10/31/20: 211 lb (95.7 kg).  LABS: CBC:    Component Value Date/Time   WBC 8.4 10/17/2020 1216   HGB 15.5 10/17/2020 1216   HGB 12.3 (L) 10/01/2013 0508   HCT 44.9 10/17/2020 1216   HCT 35.7 (L) 10/01/2013 0508   PLT 295 10/17/2020 1216   PLT 302 10/01/2013 0508   MCV 87.0 10/17/2020 1216   MCV 89 10/01/2013 0508   NEUTROABS 6.5 10/17/2020 1216   NEUTROABS 9.8 (H) 10/01/2013 0508   LYMPHSABS 1.2 10/17/2020 1216   LYMPHSABS 1.6 10/01/2013 0508   MONOABS 0.5 10/17/2020 1216   MONOABS 0.9 10/01/2013 0508   EOSABS 0.1 10/17/2020 1216   EOSABS 0.1 10/01/2013 0508   BASOSABS 0.0 10/17/2020 1216   BASOSABS 0.1 10/01/2013 0508   Comprehensive Metabolic Panel:    Component Value Date/Time   NA 136 10/17/2020 1216   NA 135 (L) 09/29/2013 0534   K 4.0 10/17/2020 1216   K 3.8 09/29/2013 0534   CL 96 (L) 10/17/2020 1216   CL 101 09/29/2013 0534   CO2 29 10/17/2020 1216   CO2 29 09/29/2013 0534   BUN 16 10/17/2020 1216   BUN 12 09/29/2013 0534   CREATININE 0.79 10/17/2020 1216   CREATININE 1.15 09/29/2013 0534   GLUCOSE 175 (H) 10/17/2020 1216   GLUCOSE 258 (H) 09/29/2013 0534   CALCIUM 9.7 10/17/2020 1216   CALCIUM 8.7 09/29/2013 0534   AST  21 10/17/2020 1216   AST 19 10/01/2013 0508   ALT 22 10/17/2020 1216   ALT 29 10/01/2013 0508   ALKPHOS 58 10/17/2020 1216   ALKPHOS 76 10/01/2013 0508   BILITOT 0.4 10/17/2020 1216   BILITOT 0.6 10/01/2013 0508   PROT 7.8 10/17/2020 1216   PROT 5.7 (L) 10/01/2013 0508   ALBUMIN 4.3 10/17/2020 1216   ALBUMIN 2.4 (L) 10/01/2013 0508    RADIOGRAPHIC STUDIES: NM PET Image Initial (PI) Skull Base To Thigh  Result Date: 10/26/2020 CLINICAL DATA:  Initial treatment strategy for  carcinoma base of tongue. Diabetes. COVID booster 9-21. EXAM: NUCLEAR MEDICINE PET SKULL BASE TO THIGH TECHNIQUE: 11.5 mCi F-18 FDG was injected intravenously. Full-ring PET imaging was performed from the skull base to thigh after the radiotracer. CT data was obtained and used for attenuation correction and anatomic localization. Fasting blood glucose: 180 mg/dl COMPARISON:  09/28/2020 and neck CT. Abdominopelvic CT 09/24/2013. Chest radiograph 09/19/2013. FINDINGS: Mediastinal blood pool activity: SUV max 2.2 Liver activity: SUV max NA NECK: The right level 2 nodal mass measures 2.9 cm and a S.U.V. max of 8.1 on 31/3. Right posterior tongue base primary measures 1.7 cm and a S.U.V. max of 8.0 on 38/3. No contralateral nodal hypermetabolism. Incidental CT findings: No other cervical adenopathy. CHEST: Distal esophageal focal hypermetabolism may correspond to an endoluminal soft tissue density lesion. 1.5 cm and a S.U.V. max of 5.1 on 112/3. Superior segment right lower lobe low-level hypermetabolism with likely correlate peribronchovascular ground-glass nodularity. Example at a S.U.V. max of 2.1 on images 102 and 85 of series 3. Incidental CT findings: Aortic and coronary artery atherosclerosis. Tiny hiatal hernia. Mild cardiomegaly. ABDOMEN/PELVIS: No abdominopelvic parenchymal or nodal hypermetabolism. Incidental CT findings: Normal adrenal glands. Minimal pneumobilia is suspected on image 135/3. Cholecystectomy. Scattered  colonic diverticula. Mild cardiomegaly. Fat containing right inguinal hernia. Prior ventral abdominal wall hernia repair with areas of residual or recurrent wall laxity containing fat. SKELETON: No abnormal marrow activity. Incidental CT findings: none IMPRESSION: 1. Right tongue base primary with ipsilateral level 2 nodal metastasis. 2. No findings of extracervical metastatic disease. 3. Focal hypermetabolism in the distal esophagus, with possible concurrent soft tissue density lesion. Recommend correlation with endoscopy. 4. Vague right lower lobe low-level hypermetabolism and ground-glass nodularity. Favored minimal infection or aspiration. 5. Incidental findings, including: Aortic atherosclerosis (ICD10-I70.0), coronary artery atherosclerosis and emphysema (ICD10-J43.9). Tiny hiatal hernia. Prostatomegaly. Electronically Signed   By: Abigail Miyamoto M.D.   On: 10/26/2020 11:59   [place] CORE BIOPSY (LYMPH NODES)  Result Date: 10/11/2020 INDICATION: 74 year old male with right tonsillar mass and suspected metastatic adenopathy in the right cervical chain. EXAM: ULTRASOUND OF THE LYMPH NODES MEDICATIONS: None. ANESTHESIA/SEDATION: None. FLUOROSCOPY TIME:  None. COMPLICATIONS: None immediate. PROCEDURE: Informed written consent was obtained from the patient after a thorough discussion of the procedural risks, benefits and alternatives. All questions were addressed. Maximal Sterile Barrier Technique was utilized including caps, mask, sterile gowns, sterile gloves, sterile drape, hand hygiene and skin antiseptic. A timeout was performed prior to the initiation of the procedure. Ultrasound was used to interrogate the right neck. There is an approximately 4.0 x 2.3 cm hypoechoic mass in the right cervical chain with internal dystrophic calcifications. A suitable skin entry site was selected and marked. The overlying skin was sterilely prepped and draped in the standard fashion using chlorhexidine skin prep. Local  anesthesia was attained by infiltration with 1% lidocaine. A small dermatotomy was made. Under real-time ultrasound guidance, multiple 18 gauge core biopsies were obtained coaxially using the Bard mission automated biopsy device. Biopsy specimens were placed in both formalin and saline and delivered to pathology for further analysis. Post biopsy ultrasound imaging demonstrates no evidence of immediate complication. IMPRESSION: Ultrasound-guided core biopsy of right cervical lymph node mass. Electronically Signed   By: Jacqulynn Cadet M.D.   On: 10/11/2020 14:13    PERFORMANCE STATUS (ECOG) : 0 - Asymptomatic  Review of Systems Unless otherwise noted, a complete review of systems is negative.  Physical Exam General: NAD Pulmonary: Unlabored Extremities: no edema, no  joint deformities Skin: no rashes Neurological: Grossly nonfocal  IMPRESSION: I met with patient, wife, and daughter today following his CT simulation for XRT.  Patient endorses severe anxiety related to his treatment and imaging.  However, he says that he tolerated the simulation study well with use of alprazolam.  However, patient has been taking alprazolam 1 mg 3 times daily and found it has caused him to feel loopy.  Patient has been on citalopram 10 mg daily since at least 2018.  This was started after consultation with Dr. Weber Cooks with psychiatry.  Patient is not actively followed by psychiatry and has SSRI managed by PCP.  Patient's immediate goals are to manage his anxiety in a way that will allow him to receive cancer treatment.  He does want to resume driving and we discussed this at length today.  Patient and family verbalized an understanding that patient is NOT to drive while taking the alprazolam.  Will plan to increase dose of citalopram and dose reduce alprazolam. Hopefully, patient can be eventually weaned entirely from BZDs.   PDMP reviewed.   Patient has no other distressing symptoms at present.  He is  functionally independent with care at home. He has no swallowing difficulty. He is pending nutritionist consult. Could consider SLP referral if needed.   Discussed resources available at the [facility].   PLAN: -Continue current scope of treatment -Increase citalopram to 172m daily and can consider further increase to 458mdaily after 1-2 weeks if needed -Reduce alprazolam to 0.72m772mID PRN (#45, fill date of 12/22) -Follow up My Chart visit in 2-3 weeks  Case and plan discussed with Dr. ChrBaruch GoutyPatient expressed understanding and was in agreement with this plan. He also understands that He can call the clinic at any time with any questions, concerns, or complaints.     Time Total: 30 minutes  Visit consisted of counseling and education dealing with the complex and emotionally intense issues of symptom management and palliative care in the setting of serious and potentially life-threatening illness.Greater than 50%  of this time was spent counseling and coordinating care related to the above assessment and plan.  Signed by: JosAltha HarmhD, NP-C

## 2020-11-03 ENCOUNTER — Telehealth: Payer: Medicare Other

## 2020-11-03 ENCOUNTER — Telehealth: Payer: Self-pay | Admitting: Internal Medicine

## 2020-11-03 ENCOUNTER — Other Ambulatory Visit: Payer: Self-pay

## 2020-11-03 ENCOUNTER — Encounter: Payer: Self-pay | Admitting: Internal Medicine

## 2020-11-03 ENCOUNTER — Telehealth: Payer: Self-pay | Admitting: *Deleted

## 2020-11-03 ENCOUNTER — Ambulatory Visit (AMBULATORY_SURGERY_CENTER): Payer: Medicare Other | Admitting: Internal Medicine

## 2020-11-03 VITALS — BP 108/74 | HR 62 | Temp 97.1°F | Resp 10 | Ht 69.0 in | Wt 211.0 lb

## 2020-11-03 DIAGNOSIS — K219 Gastro-esophageal reflux disease without esophagitis: Secondary | ICD-10-CM

## 2020-11-03 DIAGNOSIS — C159 Malignant neoplasm of esophagus, unspecified: Secondary | ICD-10-CM

## 2020-11-03 DIAGNOSIS — K449 Diaphragmatic hernia without obstruction or gangrene: Secondary | ICD-10-CM

## 2020-11-03 DIAGNOSIS — K2970 Gastritis, unspecified, without bleeding: Secondary | ICD-10-CM | POA: Diagnosis not present

## 2020-11-03 DIAGNOSIS — C160 Malignant neoplasm of cardia: Secondary | ICD-10-CM

## 2020-11-03 DIAGNOSIS — K2270 Barrett's esophagus without dysplasia: Secondary | ICD-10-CM

## 2020-11-03 DIAGNOSIS — R948 Abnormal results of function studies of other organs and systems: Secondary | ICD-10-CM

## 2020-11-03 MED ORDER — SODIUM CHLORIDE 0.9 % IV SOLN: 500.0000 mL | Freq: Once | INTRAVENOUS | Status: AC

## 2020-11-03 NOTE — Telephone Encounter (Signed)
-----   Message from Milus Banister, MD sent at 11/03/2020 10:17 AM EST ----- Got it. [employer], He needs upper EUS for GE junction cancer staging, soonest available appt with myself or GM.  Thanks   ----- Message ----- From: Jerene Bears, MD Sent: 11/03/2020  10:12 AM EST To: Milus Banister, MD  Esophageal tumor at [facility] for EUS Thanks JMP

## 2020-11-03 NOTE — Telephone Encounter (Signed)
Patient wife called reporting that Endoscopy was done today and biopsy taken, but they want him to have an EUS to further investigate the end of the esophagus. This has been scheduled, but being done until 12/04/20 and she wants it moved up. I asked her to call Dr Purdle's nurse to see if she can help her get it moved up and if not will call us back to see if we can help with this

## 2020-11-03 NOTE — Patient Instructions (Signed)
Information on gastritis given to you today.  Await pathology results.  Resume previous diet and medications.  Nexium 40 mg is recommended to take daily.  YOU HAD AN ENDOSCOPIC PROCEDURE TODAY AT [facility]:   Refer to the procedure report that was given to you for any specific questions about what was found during the examination.  If the procedure report does not answer your questions, please call your gastroenterologist to clarify.  If you requested that your care partner not be given the details of your procedure findings, then the procedure report has been included in a sealed envelope for you to review at your convenience later.  YOU SHOULD EXPECT: Some feelings of bloating in the abdomen. Passage of more gas than usual.  Walking can help get rid of the air that was put into your GI tract during the procedure and reduce the bloating. If you had a lower endoscopy (such as a colonoscopy or flexible sigmoidoscopy) you may notice spotting of blood in your stool or on the toilet paper. If you underwent a bowel prep for your procedure, you may not have a normal bowel movement for a few days.  Please Note:  You might notice some irritation and congestion in your nose or some drainage.  This is from the oxygen used during your procedure.  There is no need for concern and it should clear up in a day or so.  SYMPTOMS TO REPORT IMMEDIATELY:    Following upper endoscopy (EGD)  Vomiting of blood or coffee ground material  New chest pain or pain under the shoulder blades  Painful or persistently difficult swallowing  New shortness of breath  Fever of 100F or higher  Black, tarry-looking stools  For urgent or emergent issues, a gastroenterologist can be reached at any hour by calling [phone]. Do not use MyChart messaging for urgent concerns.    DIET:  We do recommend a small meal at first, but then you may proceed to your regular diet.  Drink plenty of fluids but you  should avoid alcoholic beverages for 24 hours.  ACTIVITY:  You should plan to take it easy for the rest of today and you should NOT DRIVE or use heavy machinery until tomorrow (because of the sedation medicines used during the test).    FOLLOW UP: Our staff will call the number listed on your records 48-72 hours following your procedure to check on you and address any questions or concerns that you may have regarding the information given to you following your procedure. If we do not reach you, we will leave a message.  We will attempt to reach you two times.  During this call, we will ask if you have developed any symptoms of COVID 19. If you develop any symptoms (ie: fever, flu-like symptoms, shortness of breath, cough etc.) before then, please call [phone].  If you test positive for Covid 19 in the 2 weeks post procedure, please call and report this information to [place].    If any biopsies were taken you will be contacted by phone or by letter within the next 1-3 weeks.  Please call us at [phone] if you have not heard about the biopsies in 3 weeks.    SIGNATURES/CONFIDENTIALITY: You and/or your care partner have signed paperwork which will be entered into your electronic medical record.  These signatures attest to the fact that that the information above on your After Visit Summary has been reviewed and is understood.  Full  responsibility of the confidentiality of this discharge information lies with you and/or your care-partner.

## 2020-11-03 NOTE — Op Note (Signed)
[place] Patient Name: Sean Garner Procedure Date: 11/03/2020 9:41 AM MRN: [id number] Endoscopist: Jerene Bears , MD Age: 74 Referring MD:  Date of Birth: [birth year] Gender: Male Account #: [id number] Procedure:                Upper GI endoscopy Indications:              Abnormal PET scan of the GI tract suggestive of                            distal esophageal lesion, recent diagnosis of base                            of tongue cancer with lymph node involvement,                            patient reports history of GERD/indigestion                            controlled with medication, no dysphagia symptom Medicines:                Monitored Anesthesia Care Procedure:                Pre-Anesthesia Assessment:                           - Prior to the procedure, a History and Physical                            was performed, and patient medications and                            allergies were reviewed. The patient's tolerance of                            previous anesthesia was also reviewed. The risks                            and benefits of the procedure and the sedation                            options and risks were discussed with the patient.                            All questions were answered, and informed consent                            was obtained. Prior Anticoagulants: The patient has                            taken no previous anticoagulant or antiplatelet                            agents. ASA Grade Assessment: II - A patient with  mild systemic disease. After reviewing the risks                            and benefits, the patient was deemed in                            satisfactory condition to undergo the procedure.                           After obtaining informed consent, the endoscope was                            passed under direct vision. Throughout the                            procedure, the patient's  blood pressure, pulse, and                            oxygen saturations were monitored continuously. The                            Endoscope was introduced through the mouth, and                            advanced to the second part of duodenum. The upper                            GI endoscopy was accomplished without difficulty.                            The patient tolerated the procedure well. Scope In: Scope Out: Findings:                 An approximate 2 cm, fungating mass was found at                            the gastroesophageal junction, 38 cm from the                            incisors. The mass was non-obstructing and                            partially circumferential (involving one-third of                            the lumen circumference). This is arising from                            apparent Barrett's esophagus. Biopsies were taken                            with a cold forceps for histology.                           The  esophagus and gastroesophageal junction were                            examined with white light and narrow band imaging                            (NBI) from a forward view and retroflexed position.                            There were esophageal mucosal changes consistent                            with short-segment Barrett's esophagus. These                            changes involved the mucosa at the upper extent of                            the gastric folds (38 cm from the incisors)                            extending to the Z-line (35 cm from the incisors).                            The maximum longitudinal extent of these esophageal                            mucosal changes was 3 cm in length. This was                            biopsied with a cold forceps for histology (in the                            segment above the mass).                           A 2 cm hiatal hernia was present.                           Mild  inflammation characterized by erythema and                            nodularity with erosion was found in the gastric                            antrum. Biopsies were taken with a cold forceps for                            histology.                           The examined duodenum was normal. Complications:            No immediate complications. Estimated Blood Loss:  Estimated blood loss was minimal. 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 Biopsied.                           - Esophageal mucosal changes consistent with                            short-segment Barrett's esophagus. Biopsied.                           - 2 cm hiatal hernia.                           - Gastritis. Biopsied.                           - Normal examined duodenum. Recommendation:           - Patient has a contact number available for                            emergencies. The signs and symptoms of potential                            delayed complications were discussed with the                            patient. Return to normal activities tomorrow.                            Written discharge instructions were provided to the                            patient.                           - Resume previous diet.                           - Continue present medications. Nexium 40 mg is                            recommended daily.                           - Await pathology results.                           - We will arrange EUS for further staging. Jerene Bears, MD 11/03/2020 10:11:46 AM This report has been signed electronically.

## 2020-11-03 NOTE — Telephone Encounter (Signed)
Patient returned your call, informed of upcoming appointment scheduled.  Stated wants something sooner.  Please call patient's wife, Sean Garner at [phone].

## 2020-11-03 NOTE — Telephone Encounter (Signed)
Please let patient know that I have left a message with Dr. Mike Gip, his oncologist.  We will discuss this together.  I know they are very anxious to get the treatment started, which I completely understand.  After I speak with Dr. Mike Gip I will call them.  If we need to refer out to get EUS done sooner we can, but I need to speak with her 1st.

## 2020-11-03 NOTE — Telephone Encounter (Signed)
Spoke with pts wife and she is aware. She requests that you call her cell number instead of Sean Garner's. [phone]

## 2020-11-03 NOTE — Telephone Encounter (Signed)
Pts wife called and states that pt is to start radiation on 12/28 but the chemo plan has not even been decided as the doctor needs to know biopsy results of the place in pts esophagus. She states they cannot wait until 12/04/20 to have this EUS done. She wants to know if they need to be referred some where else to get this done sooner. Please advise.

## 2020-11-03 NOTE — Progress Notes (Signed)
Medical history reviewed with no changes noted. VS assessed by C.W 

## 2020-11-03 NOTE — Progress Notes (Signed)
Called to room to assist during endoscopic procedure.  Patient ID and intended procedure confirmed with present staff. Received instructions for my participation in the procedure from the performing physician.  

## 2020-11-03 NOTE — Telephone Encounter (Signed)
EUS scheduled, pt instructed and medications reviewed.  Patient instructions mailed to home.  Patient to call with any questions or concerns.  

## 2020-11-03 NOTE — Telephone Encounter (Signed)
EUS scheduled for 12/04/20 at [facility] with Dr Rush Landmark COVID test on 11/30/20 at 925 am Left message on machine to call back

## 2020-11-03 NOTE — Progress Notes (Signed)
PT taken to PACU. Monitors in place. VSS. Report given to RN. 

## 2020-11-06 ENCOUNTER — Telehealth: Payer: Self-pay | Admitting: Internal Medicine

## 2020-11-06 DIAGNOSIS — Z510 Encounter for antineoplastic radiation therapy: Secondary | ICD-10-CM | POA: Diagnosis not present

## 2020-11-06 NOTE — Telephone Encounter (Signed)
Pt's wife is requesting a call back regarding the pt's pathology report.

## 2020-11-07 ENCOUNTER — Telehealth: Payer: Self-pay | Admitting: *Deleted

## 2020-11-07 NOTE — Telephone Encounter (Signed)
  Follow up Call-  Call back number 11/03/2020  Post procedure Call Back phone  # [phone]  Permission to leave phone message Yes  Some recent data might be hidden     Patient questions:  Do you have a fever, pain , or abdominal swelling? No. Pain Score  0 *  Have you tolerated food without any problems? Yes.    Have you been able to return to your normal activities? Yes.    Do you have any questions about your discharge instructions: Diet   No. Medications  No. Follow up visit  No.  Do you have questions or concerns about your Care? No.  Actions: * If pain score is 4 or above: 1. No action needed, pain <4.Have you developed a fever since your procedure? no  2.   Have you had an respiratory symptoms (SOB or cough) since your procedure? no  3.   Have you tested positive for COVID 19 since your procedure no  4.   Have you had any family members/close contacts diagnosed with the COVID 19 since your procedure?  no   If yes to any of these questions please route to Joylene Escher, RN and Joella Prince, RN

## 2020-11-07 NOTE — Telephone Encounter (Signed)
Spoke with pts wife and let her know the results are not back yet.

## 2020-11-08 ENCOUNTER — Telehealth (INDEPENDENT_AMBULATORY_CARE_PROVIDER_SITE_OTHER): Payer: Self-pay

## 2020-11-08 ENCOUNTER — Other Ambulatory Visit: Payer: Self-pay | Admitting: *Deleted

## 2020-11-08 DIAGNOSIS — C01 Malignant neoplasm of base of tongue: Secondary | ICD-10-CM

## 2020-11-08 NOTE — Telephone Encounter (Signed)
Spoke with the patient and his spouse and he is now scheduled with Dr. Lucky Cowboy for a port placement on 11/13/20 with a 10:00 am arrival time to the [facility]. Covid testing on 11/09/20 between 8-1 pm at the [facility]. Pre-procedure instructions were discussed and will be mailed.

## 2020-11-09 ENCOUNTER — Other Ambulatory Visit: Payer: Self-pay | Admitting: Hematology and Oncology

## 2020-11-09 ENCOUNTER — Telehealth: Payer: Self-pay | Admitting: *Deleted

## 2020-11-09 ENCOUNTER — Other Ambulatory Visit: Payer: Self-pay

## 2020-11-09 ENCOUNTER — Other Ambulatory Visit
Admission: RE | Admit: 2020-11-09 | Discharge: 2020-11-09 | Disposition: A | Discharge status: Home / Self Care | Payer: Medicare Other | Source: Ambulatory Visit | Attending: Vascular Surgery | Admitting: Vascular Surgery

## 2020-11-09 DIAGNOSIS — Z01812 Encounter for preprocedural laboratory examination: Secondary | ICD-10-CM | POA: Diagnosis present

## 2020-11-09 DIAGNOSIS — Z20822 Contact with and (suspected) exposure to covid-19: Secondary | ICD-10-CM | POA: Insufficient documentation

## 2020-11-09 DIAGNOSIS — C159 Malignant neoplasm of esophagus, unspecified: Secondary | ICD-10-CM

## 2020-11-09 DIAGNOSIS — C01 Malignant neoplasm of base of tongue: Secondary | ICD-10-CM

## 2020-11-09 LAB — SARS CORONAVIRUS 2 (TAT 6-24 HRS): SARS Coronavirus 2: NEGATIVE

## 2020-11-09 NOTE — Telephone Encounter (Signed)
Wife called with questions regarding patient appointments. He is scheduled to start radiation therapy Monday, but has also been scheduled for a port placement that day as well. He is also scheduled for an EUS on 11/23/20 and she is asking if we are still going to proceed with his radiation therapy before the EUS is done. Please return her call [phone]

## 2020-11-09 NOTE — Telephone Encounter (Signed)
Just read it I was scanning. Does he need lab md chemo appt on 12/30?

## 2020-11-09 NOTE — Telephone Encounter (Signed)
Thank you, could you let us know once his Chemo is scheduled so we can coordinate his visits?

## 2020-11-09 NOTE — Telephone Encounter (Signed)
Spoke with wife and the patient is having port placed on 12/27 and endoscopy on 12/29. Wife wanted to know if it it was ok to put of radiation until after these things are done. He was supposed to start on 12/27. I spoke with Dr. Sherlene Shams who is covering for Dr. Baruch Gouty and he felt that it was fine. We rescheduled the patient to start on 12/30 at 9:45. I want to make sure that this does not interrupt his chemo if it is scheduled or planning on being scheduled around this time. Also, is the patient planning on having a feeding tube placed?

## 2020-11-09 NOTE — Telephone Encounter (Signed)
  Please call patient.  OK to put off treatment until 12/30 (or 11/20/2020).  We will need to make sure he has EMLA cream.  I plan on presenting him at tumor board on 12/30 secondary to his 2 malignancies.  We need his radiation to start early in the morning (ideally 8 o'clock) as he will need to drive to [place]  and have an MD appointment and a long chemotherapy session (cisplatin with fluids) once a week.  He can either have his labs drawn in [place] (when he goes for radiation) or [place]. Labs in [place] may help to decrease his time in [place] waiting for results prior to treatment.  M

## 2020-11-09 NOTE — Telephone Encounter (Signed)
  Have you seen these messages?

## 2020-11-13 ENCOUNTER — Encounter
Admission: RE | Disposition: A | Discharge status: Home / Self Care | Payer: Self-pay | Source: Home / Self Care | Attending: Vascular Surgery

## 2020-11-13 ENCOUNTER — Encounter: Payer: Self-pay | Admitting: Vascular Surgery

## 2020-11-13 ENCOUNTER — Other Ambulatory Visit: Payer: Self-pay

## 2020-11-13 ENCOUNTER — Telehealth: Payer: Self-pay | Admitting: Gastroenterology

## 2020-11-13 ENCOUNTER — Other Ambulatory Visit (INDEPENDENT_AMBULATORY_CARE_PROVIDER_SITE_OTHER): Payer: Self-pay | Admitting: Nurse Practitioner

## 2020-11-13 ENCOUNTER — Ambulatory Visit
Admission: RE | Admit: 2020-11-13 | Discharge: 2020-11-13 | Disposition: A | Discharge status: Home / Self Care | Payer: Medicare Other | Attending: Vascular Surgery | Admitting: Vascular Surgery

## 2020-11-13 ENCOUNTER — Ambulatory Visit: Payer: Medicare Other

## 2020-11-13 DIAGNOSIS — Z79899 Other long term (current) drug therapy: Secondary | ICD-10-CM | POA: Insufficient documentation

## 2020-11-13 DIAGNOSIS — C01 Malignant neoplasm of base of tongue: Secondary | ICD-10-CM | POA: Diagnosis not present

## 2020-11-13 DIAGNOSIS — C029 Malignant neoplasm of tongue, unspecified: Secondary | ICD-10-CM | POA: Diagnosis not present

## 2020-11-13 DIAGNOSIS — Z87891 Personal history of nicotine dependence: Secondary | ICD-10-CM | POA: Insufficient documentation

## 2020-11-13 DIAGNOSIS — Z7984 Long term (current) use of oral hypoglycemic drugs: Secondary | ICD-10-CM | POA: Diagnosis not present

## 2020-11-13 HISTORY — PX: PORTA CATH INSERTION: CATH118285

## 2020-11-13 LAB — GLUCOSE, CAPILLARY
Glucose-Capillary: 261 mg/dL — ABNORMAL HIGH (ref 70–99)
Glucose-Capillary: 253 mg/dL — ABNORMAL HIGH (ref 70–99)

## 2020-11-13 SURGERY — PORTA CATH INSERTION
Anesthesia: Moderate Sedation

## 2020-11-13 MED ORDER — CHLORHEXIDINE GLUCONATE CLOTH 2 % EX PADS
6.0000 | MEDICATED_PAD | Freq: Every day | CUTANEOUS | Status: DC
  Administered 2020-11-13: 11:00:00 6 via TOPICAL

## 2020-11-13 MED ORDER — METHYLPREDNISOLONE SODIUM SUCC 125 MG IJ SOLR: 125.0000 mg | Freq: Once | INTRAMUSCULAR | Status: DC | PRN

## 2020-11-13 MED ORDER — MIDAZOLAM HCL 2 MG/ML PO SYRP: 8.0000 mg | ORAL_SOLUTION | Freq: Once | ORAL | Status: DC | PRN

## 2020-11-13 MED ORDER — SODIUM CHLORIDE 0.9 % IV SOLN
Freq: Once | INTRAVENOUS | Status: DC
  Filled 2020-11-13: qty 2

## 2020-11-13 MED ORDER — ONDANSETRON HCL 4 MG/2ML IJ SOLN: 4.0000 mg | Freq: Four times a day (QID) | INTRAMUSCULAR | Status: DC | PRN

## 2020-11-13 MED ORDER — FENTANYL CITRATE (PF) 100 MCG/2ML IJ SOLN
INTRAMUSCULAR | Status: AC
  Filled 2020-11-13: qty 2

## 2020-11-13 MED ORDER — FAMOTIDINE 20 MG PO TABS: 40.0000 mg | ORAL_TABLET | Freq: Once | ORAL | Status: DC | PRN

## 2020-11-13 MED ORDER — CEFAZOLIN SODIUM-DEXTROSE 2-4 GM/100ML-% IV SOLN
2.0000 g | Freq: Once | INTRAVENOUS | Status: AC
  Administered 2020-11-13: 11:00:00 2 g via INTRAVENOUS

## 2020-11-13 MED ORDER — MIDAZOLAM HCL 5 MG/5ML IJ SOLN
INTRAMUSCULAR | Status: AC
  Filled 2020-11-13: qty 5

## 2020-11-13 MED ORDER — FENTANYL CITRATE (PF) 100 MCG/2ML IJ SOLN
INTRAMUSCULAR | Status: DC | PRN
  Administered 2020-11-13: 50 ug via INTRAVENOUS

## 2020-11-13 MED ORDER — MIDAZOLAM HCL 2 MG/2ML IJ SOLN
INTRAMUSCULAR | Status: DC | PRN
  Administered 2020-11-13: 2 mg via INTRAVENOUS

## 2020-11-13 MED ORDER — DIPHENHYDRAMINE HCL 50 MG/ML IJ SOLN: 50.0000 mg | Freq: Once | INTRAMUSCULAR | Status: DC | PRN

## 2020-11-13 MED ORDER — SODIUM CHLORIDE 0.9 % IV SOLN
INTRAVENOUS | Status: DC
  Administered 2020-11-13: 11:00:00 75 mL via INTRAVENOUS

## 2020-11-13 MED ORDER — HYDROMORPHONE HCL 1 MG/ML IJ SOLN: 1.0000 mg | Freq: Once | INTRAMUSCULAR | Status: DC | PRN

## 2020-11-13 MED ORDER — HEPARIN SODIUM (PORCINE) 1000 UNIT/ML IJ SOLN
INTRAMUSCULAR | Status: AC
  Filled 2020-11-13: qty 1

## 2020-11-13 SURGICAL SUPPLY — 2 items
KIT PORT POWER 8FR ISP CVUE (Port) ×3 IMPLANT
PACK ANGIOGRAPHY (CUSTOM PROCEDURE TRAY) ×3 IMPLANT

## 2020-11-13 NOTE — Telephone Encounter (Signed)
See more recent telephone and result notes for detail. EUS has been moved up to 11/24/19 with Dr. Christella Hartigan I have been in touch with Dr. Merlene Pulling by phone.  She will try to get EUS done with [name] system this week if possible, otherwise he will proceed here on Jan 6. Thanks to all involved in coordination of care.

## 2020-11-13 NOTE — Progress Notes (Signed)
Pt eating a sandwich and drinking coffee. Wife at bedside.

## 2020-11-13 NOTE — Telephone Encounter (Signed)
The pt has been cancelled and will be done at [facility] on 12/29 at [facility].

## 2020-11-13 NOTE — Discharge Instructions (Signed)
Implanted Port Home Guide An implanted port is a device that is placed under the skin. It is usually placed in the chest. The device can be used to give IV medicine, to take blood, or for dialysis. You may have an implanted port if:  You need IV medicine that would be irritating to the small veins in your hands or arms.  You need IV medicines, such as antibiotics, for a long period of time.  You need IV nutrition for a long period of time.  You need dialysis. Having a port means that your health care provider will not need to use the veins in your arms for these procedures. You may have fewer limitations when using a port than you would if you used other types of long-term IVs, and you will likely be able to return to normal activities after your incision heals. An implanted port has two main parts:  Reservoir. The reservoir is the part where a needle is inserted to give medicines or draw blood. The reservoir is round. After it is placed, it appears as a small, raised area under your skin.  Catheter. The catheter is a thin, flexible tube that connects the reservoir to a vein. Medicine that is inserted into the reservoir goes into the catheter and then into the vein. How is my port accessed? To access your port:  A numbing cream may be placed on the skin over the port site.  Your health care provider will put on a mask and sterile gloves.  The skin over your port will be cleaned carefully with a germ-killing soap and allowed to dry.  Your health care provider will gently pinch the port and insert a needle into it.  Your health care provider will check for a blood return to make sure the port is in the vein and is not clogged.  If your port needs to remain accessed to get medicine continuously (constant infusion), your health care provider will place a clear bandage (dressing) over the needle site. The dressing and needle will need to be changed every week, or as told by your health care  provider. What is flushing? Flushing helps keep the port from getting clogged. Follow instructions from your health care provider about how and when to flush the port. Ports are usually flushed with saline solution or a medicine called heparin. The need for flushing will depend on how the port is used:  If the port is only used from time to time to give medicines or draw blood, the port may need to be flushed: ? Before and after medicines have been given. ? Before and after blood has been drawn. ? As part of routine maintenance. Flushing may be recommended every 4-6 weeks.  If a constant infusion is running, the port may not need to be flushed.  Throw away any syringes in a disposal container that is meant for sharp items (sharps container). You can buy a sharps container from a pharmacy, or you can make one by using an empty hard plastic bottle with a cover. How long will my port stay implanted? The port can stay in for as long as your health care provider thinks it is needed. When it is time for the port to come out, a surgery will be done to remove it. The surgery will be similar to the procedure that was done to put the port in. Follow these instructions at home:   Flush your port as told by your health care provider.    If you need an infusion over several days, follow instructions from your health care provider about how to take care of your port site. Make sure you: ? Wash your hands with soap and water before you change your dressing. If soap and water are not available, use alcohol-based hand sanitizer. ? Change your dressing as told by your health care provider. ? Place any used dressings or infusion bags into a plastic bag. Throw that bag in the trash. ? Keep the dressing that covers the needle clean and dry. Do not get it wet. ? Do not use scissors or sharp objects near the tube. ? Keep the tube clamped, unless it is being used.  Check your port site every day for signs of  infection. Check for: ? Redness, swelling, or pain. ? Fluid or blood. ? Pus or a bad smell.  Protect the skin around the port site. ? Avoid wearing bra straps that rub or irritate the site. ? Protect the skin around your port from seat belts. Place a soft pad over your chest if needed.  Bathe or shower as told by your health care provider. The site may get wet as long as you are not actively receiving an infusion.  Return to your normal activities as told by your health care provider. Ask your health care provider what activities are safe for you.  Carry a medical alert card or wear a medical alert bracelet at all times. This will let health care providers know that you have an implanted port in case of an emergency. Get help right away if:  You have redness, swelling, or pain at the port site.  You have fluid or blood coming from your port site.  You have pus or a bad smell coming from the port site.  You have a fever. Summary  Implanted ports are usually placed in the chest for long-term IV access.  Follow instructions from your health care provider about flushing the port and changing bandages (dressings).  Take care of the area around your port by avoiding clothing that puts pressure on the area, and by watching for signs of infection.  Protect the skin around your port from seat belts. Place a soft pad over your chest if needed.  Get help right away if you have a fever or you have redness, swelling, pain, drainage, or a bad smell at the port site. This information is not intended to replace advice given to you by your health care provider. Make sure you discuss any questions you have with your health care provider. Document Revised: 02/26/2019 Document Reviewed: 12/07/2016 Elsevier Patient Education  2020 Elsevier Inc.   Moderate Conscious Sedation, Adult, Care After These instructions provide you with information about caring for yourself after your procedure. Your  health care provider may also give you more specific instructions. Your treatment has been planned according to current medical practices, but problems sometimes occur. Call your health care provider if you have any problems or questions after your procedure. What can I expect after the procedure? After your procedure, it is common:  To feel sleepy for several hours.  To feel clumsy and have poor balance for several hours.  To have poor judgment for several hours.  To vomit if you eat too soon. Follow these instructions at home: For at least 24 hours after the procedure:   Do not: ? Participate in activities where you could fall or become injured. ? Drive. ? Use heavy machinery. ? Drink alcohol. ?   Take sleeping pills or medicines that cause drowsiness. ? Make important decisions or sign legal documents. ? Take care of children on your own.  Rest. Eating and drinking  Follow the diet recommended by your health care provider.  If you vomit: ? Drink water, juice, or soup when you can drink without vomiting. ? Make sure you have little or no nausea before eating solid foods. General instructions  Have a responsible adult stay with you until you are awake and alert.  Take over-the-counter and prescription medicines only as told by your health care provider.  If you smoke, do not smoke without supervision.  Keep all follow-up visits as told by your health care provider. This is important. Contact a health care provider if:  You keep feeling nauseous or you keep vomiting.  You feel light-headed.  You develop a rash.  You have a fever. Get help right away if:  You have trouble breathing. This information is not intended to replace advice given to you by your health care provider. Make sure you discuss any questions you have with your health care provider. Document Revised: 10/17/2017 Document Reviewed: 02/24/2016 Elsevier Patient Education  2020 Elsevier Inc.   

## 2020-11-13 NOTE — Interval H&P Note (Signed)
History and Physical Interval Note:  11/13/2020 10:43 AM  Sean Garner.  has presented today for surgery, with the diagnosis of Porta Cath Placement   CA base tongue  Covid  Dec 23.  The various methods of treatment have been discussed with the patient and family. After consideration of risks, benefits and other options for treatment, the patient has consented to  Procedure(s): PORTA CATH INSERTION (N/A) as a surgical intervention.  The patient's history has been reviewed, patient examined, no change in status, stable for surgery.  I have reviewed the patient's chart and labs.  Questions were answered to the patient's satisfaction.     Festus Barren

## 2020-11-13 NOTE — Op Note (Signed)
      [facility]       Operative Note  Date: 11/13/2020  Preoperative diagnosis:  1. Tongue cancer  Postoperative diagnosis:  Same as above  Procedures: #1. Ultrasound guidance for vascular access to the right internal jugular vein. #2. Fluoroscopic guidance for placement of catheter. #3. Placement of CT compatible Port-A-Cath, right internal jugular vein.  Surgeon: Festus Barren, MD.   Anesthesia: Local with moderate conscious sedation for approximately 20  minutes using 2 mg of Versed and 50 mcg of Fentanyl  Fluoroscopy time: less than 1 minute  Contrast used: 0  Estimated blood loss: 5 cc  Indication for the procedure:  The patient is a 74 y.o.male with tongue cancer.  The patient needs a Port-A-Cath for durable venous access, chemotherapy, lab draws, and CT scans. We are asked to place this. Risks and benefits were discussed and informed consent was obtained.  Description of procedure: The patient was brought to the vascular and interventional radiology suite.  Moderate conscious sedation was administered throughout the procedure during a face to face encounter with the patient with my supervision of the RN administering medicines and monitoring the patient's vital signs, pulse oximetry, telemetry and mental status throughout from the start of the procedure until the patient was taken to the recovery room. The right neck chest and shoulder were sterilely prepped and draped, and a sterile surgical field was created. Ultrasound was used to help visualize a patent right internal jugular vein. This was then accessed under direct ultrasound guidance without difficulty with the Seldinger needle and a permanent image was recorded. A J-wire was placed. After skin nick and dilatation, the peel-away sheath was then placed over the wire. I then anesthetized an area under the clavicle approximately 1-2 fingerbreadths. A transverse incision was created and an inferior pocket was  created with electrocautery and blunt dissection. The port was then brought onto the field, placed into the pocket and secured to the chest wall with 2 Prolene sutures. The catheter was connected to the port and tunneled from the subclavicular incision to the access site. Fluoroscopic guidance was then used to cut the catheter to an appropriate length. The catheter was then placed through the peel-away sheath and the peel-away sheath was removed. The catheter tip was parked in excellent location under fluorocoscopic guidance in the SVC just above the right atrium. The pocket was then irrigated with antibiotic impregnated saline and the wound was closed with a running 3-0 Vicryl and a 4-0 Monocryl. The access incision was closed with a single 4-0 Monocryl. The Huber needle was used to withdraw blood and flush the port with heparinized saline. Dermabond was then placed as a dressing. The patient tolerated the procedure well and was taken to the recovery room in stable condition.   Festus Barren 11/13/2020 11:40 AM   This note was created with Dragon Medical transcription system. Any errors in dictation are purely unintentional.

## 2020-11-14 ENCOUNTER — Ambulatory Visit: Payer: Medicare Other

## 2020-11-15 ENCOUNTER — Ambulatory Visit: Payer: Medicare Other

## 2020-11-15 NOTE — Progress Notes (Signed)
[facility]  [address] [place], [place] [address] Phone: [phone]  Fax: [phone]   Clinic Day:  11/16/2020  Referring physician: Derinda Late, MD  Chief Complaint: Sean Garner. is a 74 y.o. 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  HPI:  The patient was last seen in the medical oncology clinic on 10/17/2020 for new patient assessment. At that time, he felt good.  He denied any systemic symptoms.  Exam revealed a 4.5 x 4 cm right cervical lymph node. Hematocrit was 44.9, hemoglobin 15.5, platelets 295,000, WBC 8,400.  We discussed plans for cisplatin and concurrent radiation.  PET scan was ordered to complete staging.  Tumor Board on 10/19/2020 recommended concurrent chemo-radiation therapy (vs surgery).  The patient saw Dr. Baruch Gouty in consult on 10/23/2020. He recommended concurrent chemoradiation therapy. He was prescribed 1 mg Xanax to help him tolerate a PET scan. Even if he cannot get a PET scan, recommended IMRT radiation therapy to base of tongue and conglomerate of metastatic disease in his right cervical chain up to 70 Gy. Herecommended treating his remaining neck nodes to 54 Gy using IMRT dose painting technique.  Patient attended chemotherapy class on 10/24/2020.  He saw Beckey Rutter, NP in the chemo care clinic on 10/24/2020.  PET scan on 10/25/2020 revealed right tongue base primary with ipsilateral level 2 nodal metastasis. There were no findings of extracervical metastatic disease. There was focal hypermetabolism in the distal esophagus, with possible concurrent soft tissue density lesion. Recommended correlation with endoscopy. There was vague right lower lobe low-level hypermetabolism and ground-glass nodularity, favored minimal infection or aspiration. Incidental findings included aortic atherosclerosis,  coronary artery atherosclerosis, emphysema, a tiny hiatal hernia, and prostatomegaly.  The patient saw Altha Harm, NP on 11/02/2020. The patient reported severe anxiety related to his treatment and imaging. He was able to tolerate his CT simulation with alprazolam. He had been taking alprazolam 1 mg TID but it caused him be loopy. He has been on citalopram 10 mg daily since at least 2018. The patient's immediate goals were to manage his anxiety in a way that allowed him to tolerate treatment. They discussed increasing his dose of citalopram and dose reducing alprazolam. Follow up was planned for 2-3 weeks.  Upper endoscopy on 11/03/2020 by Dr. Hilarie Fredrickson revealed a malignant esophageal tumor at the gastroesophageal junction. There were esophageal mucosal changes c/w short-segment Barrett's esophagus. There was a 2 cm hiatal hernia. There was gastritis.  Pathology in the esophagus at 36 cm revealed intramucosal adenocarcinoma arising in a background of high grade dysplasia and intestinal metaplasia; pathology at 38 cm revealed adenocarcinoma.  Port-a-cath was placed on 11/13/2020 by Dr. Lucky Cowboy.  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  Lesion was T1bN0 and non-obstructing.  During the interim, he has been "pretty good." His hearing is good. He underwent audiogram on 10/23/2020 at Dr. Ileene Hutchinson office a couple of weeks ago.  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  Speech discrimination scores were 100% in each ear.  His heartburn has resolved on Nexium and he has not had any nausea. He denies any pain or other symptoms.   He takes Citalopram 20 mg daily. He took Xanax 0.5 mg this morning because it "knocks the edge off of things." He only takes  Xanax when he is feeling nervous about his appointments. His family notes that Xanax makes him loopy.  Chemotherapy class went well. He has received EMLA  cream, ondansetron, and prochlorperazine prescriptions.   Past Medical History:  Diagnosis Date  . Anxiety   . Cancer [facility])    Head/throat Cancer at the base of the tongue  . Cataract    lt eye repair; present in rt eye but not ripe yet.  . Claustrophobia   . Diabetes mellitus without complication ([place])   . GERD (gastroesophageal reflux disease)   . Glaucoma    using gtts for this.  . Hypercholesterolemia   . Hypertension     Past Surgical History:  Procedure Laterality Date  . CATARACT EXTRACTION W/PHACO Left 12/03/2017   Procedure: CATARACT EXTRACTION PHACO AND INTRAOCULAR LENS PLACEMENT ([place]) COMPLICATED DIABETIC LEFT;  Surgeon: Leandrew Koyanagi, MD;  Location: [place];  Service: Ophthalmology;  Laterality: Left;  Diabetic - oral meds  . CHOLECYSTECTOMY    . COLONOSCOPY  2016  . PORTA CATH INSERTION N/A 11/13/2020   Procedure: PORTA CATH INSERTION;  Surgeon: Algernon Huxley, MD;  Location: [place] [facility];  Service: Cardiovascular;  Laterality: N/A;    Family History  Problem Relation Age of Onset  . Cancer Mother   . Colon cancer Neg Hx   . Colon polyps Neg Hx   . Esophageal cancer Neg Hx   . Rectal cancer Neg Hx   . Stomach cancer Neg Hx     Social History:  reports that he quit smoking about 36 years ago. He has never used smokeless tobacco. He reports previous alcohol use. He reports that he does not use drugs. He quit smoking cold [place] in 1986. He smoked 2 packs per day for 20 years. He drinks a glass of wine or a Margarita occasionally. 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 He was exposed to [employer]. He worked with a Geologist, engineering for 34 years. The patient is accompanied by his wife in person and his daughter, [name], via [place] [facility] today.  Allergies: No Known Allergies  Current Medications: Current Outpatient Medications  Medication Sig Dispense Refill  . ALPRAZolam (XANAX) 0.5 MG tablet Take 1 tablet (0.5 mg total) by mouth  3 (three) times daily as needed for anxiety. 45 tablet 0  . brimonidine-timolol (COMBIGAN) 0.2-0.5 % ophthalmic solution Place 1 drop into the left eye daily.    . citalopram (CELEXA) 20 MG tablet Take 1 tablet (20 mg total) by mouth daily. 30 tablet 3  . diphenhydrAMINE HCl (BENADRYL PO) Take by mouth.    . esomeprazole (NEXIUM) 40 MG capsule Take 40 mg by mouth as needed.    . ezetimibe-simvastatin (VYTORIN) 10-20 MG tablet Take 1 tablet by mouth daily at 6 PM.    . glipiZIDE (GLUCOTROL XL) 5 MG 24 hr tablet Take 5 mg by mouth daily.  1  . glucose blood test strip CHECK FASTING BLOOD SUGAR EVERY MORNING. (NEED SIGNATURE)    . lidocaine-prilocaine (EMLA) cream Apply to affected area once 30 g 3  . losartan-hydrochlorothiazide (HYZAAR) 50-12.5 MG tablet Take 1 tablet by mouth daily.    . metFORMIN (GLUCOPHAGE-XR) 500 MG 24 hr tablet Take 500 mg by mouth 2 (two) times daily.    . ondansetron (ZOFRAN) 8 MG tablet Take 1 tablet (8 mg total) by mouth 2 (two) times daily as needed. Start on the third day after cisplatin chemotherapy. 30 tablet 1  . prochlorperazine (  COMPAZINE) 10 MG tablet Take 1 tablet (10 mg total) by mouth every 6 (six) hours as needed (Nausea or vomiting). 30 tablet 1  . aspirin EC 81 MG tablet Take by mouth. (Patient not taking: No sig reported)    . pantoprazole (PROTONIX) 40 MG tablet Take 40 mg by mouth daily. (Patient not taking: No sig reported)    . simvastatin (ZOCOR) 40 MG tablet Take by mouth. (Patient not taking: No sig reported)     Current Facility-Administered Medications  Medication Dose Route Frequency Provider Last Rate Last Admin  . 0.9 %  sodium chloride infusion  500 mL Intravenous Once Pyrtle, Lajuan Lines, MD        Review of Systems  Constitutional: Negative for chills, diaphoresis, fever, malaise/fatigue and weight loss (up 1 lb).       Feels "pretty good."  HENT: Positive for hearing loss. Negative for congestion, ear discharge, ear pain, nosebleeds, sinus  pain, sore throat and tinnitus.   Eyes: Negative for blurred vision.  Respiratory: Negative for cough, hemoptysis, sputum production and shortness of breath.   Cardiovascular: Negative for chest pain, palpitations and leg swelling.  Gastrointestinal: Negative for abdominal pain, blood in stool, constipation, diarrhea, heartburn (takes nexium), melena, nausea and vomiting.  Genitourinary: Negative for dysuria, frequency, hematuria and urgency.  Musculoskeletal: Negative for back pain, joint pain, myalgias and neck pain.  Skin: Negative for itching and rash.  Neurological: Negative for dizziness, tingling, sensory change, weakness and headaches.  Endo/Heme/Allergies: Does not bruise/bleed easily.  Psychiatric/Behavioral: Negative for depression and memory loss. The patient is nervous/anxious (on citalopram and Xanax). The patient does not have insomnia.   All other systems reviewed and are negative.  Performance status (ECOG): 0-1  Vitals Blood pressure 119/75, pulse 60, temperature (!) 96 F (35.6 C), temperature source Tympanic, resp. rate 16, weight 213 lb 10 oz (96.9 kg), SpO2 97 %.   Physical Exam Vitals and nursing note reviewed.  Constitutional:      General: He is not in acute distress.    Appearance: He is not diaphoretic.  HENT:     Head: Normocephalic and atraumatic.     Comments: Gray hair.    Mouth/Throat:     Mouth: Mucous membranes are moist.     Pharynx: Oropharynx is clear.  Eyes:     General: No scleral icterus.    Extraocular Movements: Extraocular movements intact.     Conjunctiva/sclera: Conjunctivae normal.     Pupils: Pupils are equal, round, and reactive to light.     Comments: Blue eyes.  Neck:     Comments: 4.5 x 4 cm mass on right side of neck. Cardiovascular:     Rate and Rhythm: Normal rate and regular rhythm.     Heart sounds: Normal heart sounds. No murmur heard.   Pulmonary:     Effort: Pulmonary effort is normal. No respiratory distress.      Breath sounds: Normal breath sounds. No wheezing or rales.  Chest:     Chest wall: No tenderness.  Breasts:     Right: No axillary adenopathy or supraclavicular adenopathy.     Left: No axillary adenopathy or supraclavicular adenopathy.    Abdominal:     General: Bowel sounds are normal. There is no distension.     Palpations: Abdomen is soft. There is no mass.     Tenderness: There is no abdominal tenderness. There is no guarding or rebound.  Musculoskeletal:        General: No  swelling or tenderness. Normal range of motion.     Cervical back: Normal range of motion and neck supple.  Lymphadenopathy:     Head:     Right side of head: No preauricular, posterior auricular or occipital adenopathy.     Left side of head: No preauricular, posterior auricular or occipital adenopathy.     Cervical: No cervical adenopathy.     Upper Body:     Right upper body: No supraclavicular or axillary adenopathy.     Left upper body: No supraclavicular or axillary adenopathy.     Lower Body: No right inguinal adenopathy. No left inguinal adenopathy.  Skin:    General: Skin is warm and dry.  Neurological:     Mental Status: He is alert and oriented to person, place, and time.  Psychiatric:        Behavior: Behavior normal.        Thought Content: Thought content normal.        Judgment: Judgment normal.    No visits with results within 3 Day(s) from this visit.  Latest known visit with results is:  Admission on 11/13/2020, Discharged on 11/13/2020  Component Date Value Ref Range Status  . Glucose-Capillary 11/13/2020 261* 70 - 99 mg/dL Final   Glucose reference range applies only to samples taken after fasting for at least 8 hours.  . Glucose-Capillary 11/13/2020 253* 70 - 99 mg/dL Final   Glucose reference range applies only to samples taken after fasting for at least 8 hours.    Assessment:  Sean Garner. is a 74 y.o. 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  He presented with right neck adenopathy.    Neck soft tissue CT on 09/28/2020 revealed a 3.5 x 3.0 x 4.8 cm right neck mass most c/w enlarged lymph node. There was a 14 mm enhancing mass in the right base of tongue.   PET scan on 10/25/2020 revealed right tongue base primary with ipsilateral level 2 nodal metastasis. There were no findings of extracervical metastatic disease. There was focal hypermetabolism in the distal esophagus, with possible concurrent soft tissue density lesion. Recommended correlation with endoscopy. There was vague right lower lobe low-level hypermetabolism and ground-glass nodularity, favored minimal infection or aspiration. Incidental findings included aortic atherosclerosis, coronary artery atherosclerosis, emphysema, a tiny hiatal hernia, and prostatomegaly.  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  Speech discrimination scores were 100% in each ear.   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  Upper endoscopy on 11/03/2020 revealed a malignant esophageal tumor at the gastroesophageal junction. There were esophageal mucosal changes c/w short-segment Barrett's esophagus. There was a 2 cm hiatal hernia. There was gastritis.  Pathology in the esophagus at 36 cm revealed intramucosal adenocarcinoma arising in a background of high grade dysplasia and intestinal metaplasia; pathology at 38 cm revealed adenocarcinoma.  EUS on 11/15/2020 revealed early stage adenocarcinoma arising from Barrett's esophagus.  Lesion was T1bN0 and non-obstructing.  He has PTSD and clastrophobia.  Anxiety is relieved by alprazolam.  He is also on citalopram.  Symptomatically, he has felt "pretty good." His hearing is good. His heartburn has resolved on Nexium.  He  denies any pain or other symptoms.  Exam reveals a 4.5 x 4 cm right neck  mass.  Plan: 1.  Clinical stage I right base of tongue carcinoma  Clinical stage I.  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  PET scan on 10/25/2020 revealed a right base of tongue primary with ipsilateral level 2 nodal metastasis.   No evidence of distant metastasis.  Review plan for chemotherapy with radiation.   Cisplatin 40 mg/m2 every week x 7.   Potential side effects reviewed.   Patient attended chemotherapy class.  He has a baseline audiogram - obtain copy of results.  Discuss plan to treat base of tongue cancer first then manage esophageal cancer.  Radiation and weekly cisplatin will begin on 11/20/2020.  Multiple questions asked and answered. 2.   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  EUS on 11/15/2020 revealed a T1bN0 lesion.  Discuss treatment options:    Endoscopic resection followed by ablation or esphagectomy.  Discuss referral to surgery for evaluation.  Present at tumor board today. 3.   RTC on 11/20/2020 for MD assessment, labs (CBC with diff, CMP, Mg in [place]), and week #1 cisplatin.  I discussed the assessment and treatment plan with the patient.  The patient was provided an opportunity to ask questions and all were answered.  The patient agreed with the plan and demonstrated an understanding of the instructions.  The patient was advised to call back if the symptoms worsen or if the condition fails to improve as anticipated.  I provided 24 minutes of face-to-face time during this this encounter and > 50% was spent counseling as documented under my assessment and plan.  An additional 20-25 minutes were spent reviewing his chart (Epic and Care Everywhere) including notes, labs, and imaging studies as well as speaking  with other providers.  I personally spoke with Dr Rhea Belton and Dr Shana Chute in coordinating patient's care.   Alysandra Lobue C. Merlene Pulling, MD, PhD    11/16/2020, 9:38 AM  I, Danella Penton Tufford, am acting as Neurosurgeon for [employer]. Merlene Pulling, MD, PhD.  I, Tanashia Ciesla C. Merlene Pulling, MD, have reviewed the above documentation for accuracy and completeness, and I agree with the above.

## 2020-11-16 ENCOUNTER — Inpatient Hospital Stay: Payer: Medicare Other

## 2020-11-16 ENCOUNTER — Ambulatory Visit: Admission: RE | Admit: 2020-11-16 | Payer: Medicare Other | Source: Ambulatory Visit

## 2020-11-16 ENCOUNTER — Inpatient Hospital Stay (HOSPITAL_BASED_OUTPATIENT_CLINIC_OR_DEPARTMENT_OTHER): Payer: Medicare Other | Admitting: Hematology and Oncology

## 2020-11-16 ENCOUNTER — Ambulatory Visit: Payer: Medicare Other

## 2020-11-16 ENCOUNTER — Other Ambulatory Visit: Payer: Self-pay

## 2020-11-16 ENCOUNTER — Encounter: Payer: Self-pay | Admitting: Hematology and Oncology

## 2020-11-16 VITALS — BP 119/75 | HR 60 | Temp 96.0°F | Resp 16 | Wt 213.6 lb

## 2020-11-16 DIAGNOSIS — C159 Malignant neoplasm of esophagus, unspecified: Secondary | ICD-10-CM | POA: Insufficient documentation

## 2020-11-16 DIAGNOSIS — C01 Malignant neoplasm of base of tongue: Secondary | ICD-10-CM | POA: Diagnosis not present

## 2020-11-16 NOTE — Progress Notes (Addendum)
[facility]  [address] [place], [place] [address] Phone: [phone]  Fax: [phone]   Clinic Day:  11/20/2020  Referring physician: Derinda Late, MD  Chief Complaint: Sean Garner. is a 74 y.o. 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  HPI:  The patient was last seen in the medical oncology clinic on 11/16/2020. At that time, multiple studies were reviewed.  PET scan revealed the base of tongue mass and right neck adenopathy as well as activity at the GE junction.  EGD confirmed adenocarcinoma arising in a background of Barrett's esophagus.  EUS confirmed a T1bN0 esophageal carcinoma.  We discussed management of both malignancies.  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  Speech discrimination scores were 100% in each ear.  He was presented at tumor board on 11/16/2020.  Tumor board agreed with his treatment plan.  During the interim, he has been "anxious." He refers to himself as "a nervous wreck." He wakes up in the middle of the night trembling. He also felt claustrophobic in his house and had to go outside, which is not normal for him. He was able to tolerate radiation.   The patient would like to switch off of Xanax due to the risk of addiction. His wife states that his current medications make him "too loopy."The last time he took Xanax was on 12/19/2019. Last night, he took two Benadryl and one melatonin to sleep. He was able to stay sleep until 4 am.  We discussed decreasing his Xanax to 0.25 mg.  He saw a psychiatrist years ago and was put on citalopram. He is seeing Billey Chang, NP on 11/27/2020.  The patient did not take Metformin this morning.  He is ready to begin treatment today.   Past Medical History:  Diagnosis Date  .  Anxiety   . Cancer [facility])    Head/throat Cancer at the base of the tongue  . Cataract    lt eye repair; present in rt eye but not ripe yet.  . Claustrophobia   . Diabetes mellitus without complication ([place])   . GERD (gastroesophageal reflux disease)   . Glaucoma    using gtts for this.  . Hypercholesterolemia   . Hypertension     Past Surgical History:  Procedure Laterality Date  . CATARACT EXTRACTION W/PHACO Left 12/03/2017   Procedure: CATARACT EXTRACTION PHACO AND INTRAOCULAR LENS PLACEMENT ([place]) COMPLICATED DIABETIC LEFT;  Surgeon: Leandrew Koyanagi, MD;  Location: [place];  Service: Ophthalmology;  Laterality: Left;  Diabetic - oral meds  . CHOLECYSTECTOMY    . COLONOSCOPY  2016  . PORTA CATH INSERTION N/A 11/13/2020   Procedure: PORTA CATH INSERTION;  Surgeon: Algernon Huxley, MD;  Location: [facility];  Service: Cardiovascular;  Laterality: N/A;    Family History  Problem Relation Age of Onset  . Cancer Mother   . Colon cancer Neg Hx   . Colon polyps Neg Hx   . Esophageal cancer Neg Hx   . Rectal cancer Neg Hx   . Stomach cancer Neg Hx     Social History:  reports that he quit smoking about 36 years ago. He has never used smokeless tobacco. He reports previous alcohol use. He reports that he does not use drugs. He quit smoking cold [place]  in 1986. He smoked 2 packs per day for 20 years. He drinks a glass of wine or a Margarita occasionally. 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 He was exposed to [employer]. He worked with a Geologist, engineering for 34 years. The patient is accompanied by his wife in person today.  Allergies: No Known Allergies  Current Medications: Current Outpatient Medications  Medication Sig Dispense Refill  . brimonidine-timolol (COMBIGAN) 0.2-0.5 % ophthalmic solution Place 1 drop into the left eye daily.    . citalopram (CELEXA) 20 MG tablet Take 1 tablet (20 mg total) by mouth daily. 30 tablet 3  .  diphenhydrAMINE HCl (BENADRYL PO) Take by mouth.    . esomeprazole (NEXIUM) 40 MG capsule Take 40 mg by mouth as needed.    . ezetimibe-simvastatin (VYTORIN) 10-20 MG tablet Take 1 tablet by mouth daily at 6 PM.    . glipiZIDE (GLUCOTROL XL) 5 MG 24 hr tablet Take 5 mg by mouth daily.  1  . glucose blood test strip CHECK FASTING BLOOD SUGAR EVERY MORNING. (NEED SIGNATURE)    . lidocaine-prilocaine (EMLA) cream Apply to affected area once 30 g 3  . losartan-hydrochlorothiazide (HYZAAR) 50-12.5 MG tablet Take 1 tablet by mouth daily.    . metFORMIN (GLUCOPHAGE-XR) 500 MG 24 hr tablet Take 500 mg by mouth 2 (two) times daily.    . ondansetron (ZOFRAN) 8 MG tablet Take 1 tablet (8 mg total) by mouth 2 (two) times daily as needed. Start on the third day after cisplatin chemotherapy. 30 tablet 1  . prochlorperazine (COMPAZINE) 10 MG tablet Take 1 tablet (10 mg total) by mouth every 6 (six) hours as needed (Nausea or vomiting). 30 tablet 1  . ALPRAZolam (XANAX) 0.5 MG tablet Take 1 tablet (0.5 mg total) by mouth 3 (three) times daily as needed for anxiety. (Patient not taking: Reported on 11/20/2020) 45 tablet 0  . aspirin EC 81 MG tablet Take by mouth. (Patient not taking: No sig reported)    . dexamethasone (DECADRON) 4 MG tablet Take 2 tablets (8 mg total) by mouth daily. Take daily x 3 days starting the day after cisplatin chemotherapy. Take with food. 30 tablet 1  . pantoprazole (PROTONIX) 40 MG tablet Take 40 mg by mouth daily. (Patient not taking: No sig reported)    . simvastatin (ZOCOR) 40 MG tablet Take by mouth. (Patient not taking: No sig reported)     Current Facility-Administered Medications  Medication Dose Route Frequency Provider Last Rate Last Admin  . 0.9 %  sodium chloride infusion  500 mL Intravenous Once Pyrtle, Lajuan Lines, MD        Review of Systems  Constitutional: Positive for weight loss (1 lb). Negative for chills, diaphoresis, fever and malaise/fatigue.       Feels "anxious."   HENT: Positive for hearing loss. Negative for congestion, ear discharge, ear pain, nosebleeds, sinus pain, sore throat and tinnitus.   Eyes: Negative for blurred vision.  Respiratory: Negative for cough, hemoptysis, sputum production and shortness of breath.   Cardiovascular: Negative for chest pain, palpitations and leg swelling.  Gastrointestinal: Negative for abdominal pain, blood in stool, constipation, diarrhea, heartburn (takes Nexium), melena, nausea and vomiting.  Genitourinary: Negative for dysuria, frequency, hematuria and urgency.  Musculoskeletal: Negative for back pain, joint pain, myalgias and neck pain.  Skin: Negative for itching and rash.  Neurological: Negative for dizziness, tingling, sensory change, weakness and headaches.  Endo/Heme/Allergies: Does not bruise/bleed easily.  Psychiatric/Behavioral: Negative  for depression and memory loss. The patient is nervous/anxious (on citalopram and xanax). The patient does not have insomnia.   All other systems reviewed and are negative.  Performance status (ECOG): 0-1  Vitals Blood pressure 110/88, pulse (!) 55, temperature 98.7 F (37.1 C), resp. rate 20, weight 212 lb 15.4 oz (96.6 kg), SpO2 99 %.   Physical Exam Vitals and nursing note reviewed.  Constitutional:      General: He is not in acute distress.    Appearance: He is not diaphoretic.  HENT:     Head: Normocephalic and atraumatic.     Comments: Gray hair.    Mouth/Throat:     Mouth: Mucous membranes are moist.     Pharynx: Oropharynx is clear.  Eyes:     General: No scleral icterus.    Extraocular Movements: Extraocular movements intact.     Conjunctiva/sclera: Conjunctivae normal.     Pupils: Pupils are equal, round, and reactive to light.     Comments: Blue eyes.  Neck:     Comments: 4.5 x 4 cm right neck mass. Cardiovascular:     Rate and Rhythm: Normal rate and regular rhythm.     Heart sounds: Normal heart sounds. No murmur heard.   Pulmonary:      Effort: Pulmonary effort is normal. No respiratory distress.     Breath sounds: Normal breath sounds. No wheezing or rales.  Chest:     Chest wall: No tenderness.  Breasts:     Right: No axillary adenopathy or supraclavicular adenopathy.     Left: No axillary adenopathy or supraclavicular adenopathy.    Abdominal:     General: Bowel sounds are normal. There is no distension.     Palpations: Abdomen is soft. There is no mass.     Tenderness: There is no abdominal tenderness. There is no guarding or rebound.  Musculoskeletal:        General: No swelling or tenderness. Normal range of motion.     Cervical back: Normal range of motion and neck supple.  Lymphadenopathy:     Head:     Right side of head: No preauricular, posterior auricular or occipital adenopathy.     Left side of head: No preauricular, posterior auricular or occipital adenopathy.     Upper Body:     Right upper body: No supraclavicular or axillary adenopathy.     Left upper body: No supraclavicular or axillary adenopathy.     Lower Body: No right inguinal adenopathy. No left inguinal adenopathy.  Skin:    General: Skin is warm and dry.  Neurological:     Mental Status: He is alert and oriented to person, place, and time.  Psychiatric:        Behavior: Behavior normal.        Thought Content: Thought content normal.        Judgment: Judgment normal.    Orders Only on 11/20/2020  Component Date Value Ref Range Status  . Total Protein 11/20/2020 7.4  6.5 - 8.1 g/dL Final  . Albumin 38/18/2993 4.2  3.5 - 5.0 g/dL Final  . AST 71/69/6789 17  15 - 41 U/L Final  . ALT 11/20/2020 17  0 - 44 U/L Final  . Alkaline Phosphatase 11/20/2020 56  38 - 126 U/L Final  . Total Bilirubin 11/20/2020 1.0  0.3 - 1.2 mg/dL Final  . Bilirubin, Direct 11/20/2020 0.1  0.0 - 0.2 mg/dL Final  . Indirect Bilirubin 11/20/2020 0.9  0.3 - 0.9  mg/dL Final   Performed at [facility], [address], [place], [place] [address]   Infusion on 11/20/2020  Component Date Value Ref Range Status  . WBC 11/20/2020 9.8  4.0 - 10.5 K/uL Final  . RBC 11/20/2020 4.91  4.22 - 5.81 MIL/uL Final  . Hemoglobin 11/20/2020 14.5  13.0 - 17.0 g/dL Final  . HCT 11/20/2020 41.8  39.0 - 52.0 % Final  . MCV 11/20/2020 85.1  80.0 - 100.0 fL Final  . MCH 11/20/2020 29.5  26.0 - 34.0 pg Final  . MCHC 11/20/2020 34.7  30.0 - 36.0 g/dL Final  . RDW 11/20/2020 12.4  11.5 - 15.5 % Final  . Platelets 11/20/2020 252  150 - 400 K/uL Final  . nRBC 11/20/2020 0.0  0.0 - 0.2 % Final  . Neutrophils Relative % 11/20/2020 81  % Final  . Neutro Abs 11/20/2020 8.0* 1.7 - 7.7 K/uL Final  . Lymphocytes Relative 11/20/2020 10  % Final  . Lymphs Abs 11/20/2020 0.9  0.7 - 4.0 K/uL Final  . Monocytes Relative 11/20/2020 7  % Final  . Monocytes Absolute 11/20/2020 0.7  0.1 - 1.0 K/uL Final  . Eosinophils Relative 11/20/2020 1  % Final  . Eosinophils Absolute 11/20/2020 0.1  0.0 - 0.5 K/uL Final  . Basophils Relative 11/20/2020 1  % Final  . Basophils Absolute 11/20/2020 0.1  0.0 - 0.1 K/uL Final  . Immature Granulocytes 11/20/2020 0  % Final  . Abs Immature Granulocytes 11/20/2020 0.04  0.00 - 0.07 K/uL Final   Performed at [facility], [address] [place], [place] [address]  . Sodium 11/20/2020 132* 135 - 145 mmol/L Final  . Potassium 11/20/2020 3.6  3.5 - 5.1 mmol/L Final  . Chloride 11/20/2020 95* 98 - 111 mmol/L Final  . CO2 11/20/2020 30  22 - 32 mmol/L Final  . Glucose, Bld 11/20/2020 233* 70 - 99 mg/dL Final   Glucose reference range applies only to samples taken after fasting for at least 8 hours.  . BUN 11/20/2020 15  8 - 23 mg/dL Final  . Creatinine, Ser 11/20/2020 0.88  0.61 - 1.24 mg/dL Final  . Calcium 11/20/2020 9.5  8.9 - 10.3 mg/dL Final  . GFR, Estimated 11/20/2020 >60  >60 mL/min Final   Comment: (NOTE) Calculated using the CKD-EPI Creatinine Equation (2021)   . Anion gap 11/20/2020 7  5 - 15 Final   Performed at [facility], [address] [place], [place] [address]  . Magnesium 11/20/2020 2.0  1.7 - 2.4 mg/dL Final   Performed at [facility], [address] [place], [place] [address]   Assessment:  Sean Garner. is a 74 y.o. 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  He presented with right neck adenopathy.    Neck soft tissue CT on 09/28/2020 revealed a 3.5 x 3.0 x 4.8 cm right neck mass most c/w enlarged lymph node. There was a 14 mm enhancing mass in the right base of tongue.   PET scan on 10/25/2020 revealed right tongue base primary with ipsilateral level 2 nodal metastasis. There were no findings of extracervical metastatic disease. There was focal hypermetabolism in the distal esophagus, with possible concurrent soft tissue density lesion. Recommended correlation with endoscopy. There was vague right lower  lobe low-level hypermetabolism and ground-glass nodularity, favored minimal infection or aspiration. Incidental findings included aortic atherosclerosis, coronary artery atherosclerosis, emphysema, a tiny hiatal hernia, and prostatomegaly.  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  Speech discrimination scores were 100% in each ear.   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  Upper endoscopy on 11/03/2020 revealed a malignant esophageal tumor at the gastroesophageal junction. There were esophageal mucosal changes c/w short-segment Barrett's esophagus. There was a 2 cm hiatal hernia. There was gastritis.  Pathology in the esophagus at 36 cm revealed intramucosal adenocarcinoma arising in a background of high grade dysplasia and intestinal metaplasia; pathology at 38 cm revealed adenocarcinoma.  EUS on 11/15/2020 revealed  early stage adenocarcinoma arising from Barrett's esophagus.  Lesion was T1bN0 and non-obstructing.  He has PTSD and clastrophobia.  Anxiety is relieved by alprazolam.  He is also on citalopram.  Symptomatically, he feels "anxious".  Exam is stable.  Plan: 1.   Labs today:  CBC with diff, CMP, Mg. 2.   Clinical stage I right base of tongue carcinoma  Clinical stage I.  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  PET scan on 10/25/2020 revealed a right base of tongue primary with ipsilateral level 2 nodal metastasis.   No evidence of distant metastasis.  Review plan for cisplatin 40 mg/m2 every week x 7.    Review potential side effects.   Discuss use of Decadron to prevent nausea and vomiting.    Contact Dr Baldemar Lenis re: Decadron and anticipated increase blood sugar- done.   Patient consents to treatment.  Rx:  Decadron 8 mg a day x 3 days after chemotherapy.   Taper as tolerated to prevent nausea.  Labs reviewed.  Begin week #1 cisplatin. 3.   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  EUS on 11/15/2020 revealed a T1bN0 lesion.  He may be a candidate for endoscopic resection followed by ablation or esphagectomy.  Discuss referral to surgery for evaluation.   Contact Mariea Clonts, nurse navigator. 4.   Anxiety  Patient did well with radiation today.  Suspect he will continue to do better with treatment as the unknown (treatment) will become routine.  Discuss decreasing Xanax to 0.25 mg. 5.   Week #1 cisplatin today. 6.   RTC in 1 week for MD assessment, labs (CBC with diff, CMP, Mg), and week #2 Cisplatin.Week #1 Cisplatin today.  I discussed the assessment and treatment plan with the patient.  The patient was provided an opportunity to ask questions and all were answered.  The patient agreed with the plan and  demonstrated an understanding of the instructions.  The patient was advised to call back if the symptoms worsen or if the condition fails to improve as anticipated.   Melissa C. Mike Gip, MD, PhD    11/20/2020, 9:59 AM  I, Mirian Mo Tufford, am acting as Education administrator for [employer]. Mike Gip, MD, PhD.  I, Melissa C. Mike Gip, MD, have reviewed the above documentation for accuracy and completeness, and I agree with the above.

## 2020-11-19 ENCOUNTER — Other Ambulatory Visit: Payer: Self-pay | Admitting: Hematology and Oncology

## 2020-11-19 DIAGNOSIS — Z5111 Encounter for antineoplastic chemotherapy: Secondary | ICD-10-CM | POA: Insufficient documentation

## 2020-11-20 ENCOUNTER — Other Ambulatory Visit: Payer: Self-pay

## 2020-11-20 ENCOUNTER — Inpatient Hospital Stay: Payer: Medicare Other

## 2020-11-20 ENCOUNTER — Ambulatory Visit
Admission: RE | Admit: 2020-11-20 | Discharge: 2020-11-20 | Disposition: A | Discharge status: Home / Self Care | Payer: Medicare Other | Source: Ambulatory Visit | Attending: Radiation Oncology | Admitting: Radiation Oncology

## 2020-11-20 ENCOUNTER — Inpatient Hospital Stay: Payer: Medicare Other | Attending: Hematology and Oncology | Admitting: Hematology and Oncology

## 2020-11-20 ENCOUNTER — Other Ambulatory Visit (HOSPITAL_COMMUNITY): Payer: Medicare Other

## 2020-11-20 ENCOUNTER — Encounter: Payer: Self-pay | Admitting: Hematology and Oncology

## 2020-11-20 VITALS — BP 110/88 | HR 55 | Temp 98.7°F | Resp 20 | Wt 213.0 lb

## 2020-11-20 VITALS — BP 152/81 | HR 66

## 2020-11-20 DIAGNOSIS — E871 Hypo-osmolality and hyponatremia: Secondary | ICD-10-CM | POA: Diagnosis not present

## 2020-11-20 DIAGNOSIS — C01 Malignant neoplasm of base of tongue: Secondary | ICD-10-CM

## 2020-11-20 DIAGNOSIS — Z5111 Encounter for antineoplastic chemotherapy: Secondary | ICD-10-CM | POA: Insufficient documentation

## 2020-11-20 DIAGNOSIS — C160 Malignant neoplasm of cardia: Secondary | ICD-10-CM | POA: Insufficient documentation

## 2020-11-20 DIAGNOSIS — Z87891 Personal history of nicotine dependence: Secondary | ICD-10-CM | POA: Diagnosis not present

## 2020-11-20 DIAGNOSIS — Z79899 Other long term (current) drug therapy: Secondary | ICD-10-CM | POA: Diagnosis not present

## 2020-11-20 DIAGNOSIS — Z510 Encounter for antineoplastic radiation therapy: Secondary | ICD-10-CM | POA: Insufficient documentation

## 2020-11-20 DIAGNOSIS — F419 Anxiety disorder, unspecified: Secondary | ICD-10-CM | POA: Insufficient documentation

## 2020-11-20 DIAGNOSIS — C770 Secondary and unspecified malignant neoplasm of lymph nodes of head, face and neck: Secondary | ICD-10-CM | POA: Insufficient documentation

## 2020-11-20 DIAGNOSIS — C159 Malignant neoplasm of esophagus, unspecified: Secondary | ICD-10-CM

## 2020-11-20 DIAGNOSIS — F4310 Post-traumatic stress disorder, unspecified: Secondary | ICD-10-CM | POA: Insufficient documentation

## 2020-11-20 DIAGNOSIS — F40240 Claustrophobia: Secondary | ICD-10-CM | POA: Insufficient documentation

## 2020-11-20 LAB — CBC WITH DIFFERENTIAL/PLATELET
Abs Immature Granulocytes: 0.04 10*3/uL (ref 0.00–0.07)
Basophils Absolute: 0.1 10*3/uL (ref 0.0–0.1)
Basophils Relative: 1 %
Eosinophils Absolute: 0.1 10*3/uL (ref 0.0–0.5)
Eosinophils Relative: 1 %
HCT: 41.8 % (ref 39.0–52.0)
Hemoglobin: 14.5 g/dL (ref 13.0–17.0)
Immature Granulocytes: 0 %
Lymphocytes Relative: 10 %
Lymphs Abs: 0.9 10*3/uL (ref 0.7–4.0)
MCH: 29.5 pg (ref 26.0–34.0)
MCHC: 34.7 g/dL (ref 30.0–36.0)
MCV: 85.1 fL (ref 80.0–100.0)
Monocytes Absolute: 0.7 10*3/uL (ref 0.1–1.0)
Monocytes Relative: 7 %
Neutro Abs: 8 10*3/uL — ABNORMAL HIGH (ref 1.7–7.7)
Neutrophils Relative %: 81 %
Platelets: 252 10*3/uL (ref 150–400)
RBC: 4.91 MIL/uL (ref 4.22–5.81)
RDW: 12.4 % (ref 11.5–15.5)
WBC: 9.8 10*3/uL (ref 4.0–10.5)
nRBC: 0 % (ref 0.0–0.2)

## 2020-11-20 LAB — HEPATIC FUNCTION PANEL
ALT: 17 U/L (ref 0–44)
AST: 17 U/L (ref 15–41)
Albumin: 4.2 g/dL (ref 3.5–5.0)
Alkaline Phosphatase: 56 U/L (ref 38–126)
Bilirubin, Direct: 0.1 mg/dL (ref 0.0–0.2)
Indirect Bilirubin: 0.9 mg/dL (ref 0.3–0.9)
Total Bilirubin: 1 mg/dL (ref 0.3–1.2)
Total Protein: 7.4 g/dL (ref 6.5–8.1)

## 2020-11-20 LAB — BASIC METABOLIC PANEL
Anion gap: 7 (ref 5–15)
BUN: 15 mg/dL (ref 8–23)
CO2: 30 mmol/L (ref 22–32)
Calcium: 9.5 mg/dL (ref 8.9–10.3)
Chloride: 95 mmol/L — ABNORMAL LOW (ref 98–111)
Creatinine, Ser: 0.88 mg/dL (ref 0.61–1.24)
GFR, Estimated: >60 mL/min (ref >60)
Glucose, Bld: 233 mg/dL — ABNORMAL HIGH (ref 70–99)
Potassium: 3.6 mmol/L (ref 3.5–5.1)
Sodium: 132 mmol/L — ABNORMAL LOW (ref 135–145)

## 2020-11-20 LAB — MAGNESIUM: Magnesium: 2 mg/dL (ref 1.7–2.4)

## 2020-11-20 MED ORDER — DEXAMETHASONE 4 MG PO TABS: 8.0000 mg | ORAL_TABLET | Freq: Every day | ORAL | 1 refills | Status: DC

## 2020-11-20 MED ORDER — SODIUM CHLORIDE 0.9 % IV SOLN
Freq: Once | INTRAVENOUS | Status: AC
  Filled 2020-11-20: qty 1000

## 2020-11-20 MED ORDER — SODIUM CHLORIDE 0.9 % IV SOLN
40.0000 mg/m2 | Freq: Once | INTRAVENOUS | Status: AC
  Administered 2020-11-20: 87 mg via INTRAVENOUS
  Filled 2020-11-20: qty 87

## 2020-11-20 MED ORDER — SODIUM CHLORIDE 0.9 % IV SOLN
Freq: Once | INTRAVENOUS | Status: AC
  Filled 2020-11-20: qty 250

## 2020-11-20 MED ORDER — PALONOSETRON HCL INJECTION 0.25 MG/5ML
0.2500 mg | Freq: Once | INTRAVENOUS | Status: AC
  Administered 2020-11-20: 0.25 mg via INTRAVENOUS
  Filled 2020-11-20: qty 5

## 2020-11-20 MED ORDER — SODIUM CHLORIDE 0.9 % IV SOLN
150.0000 mg | Freq: Once | INTRAVENOUS | Status: AC
  Administered 2020-11-20: 150 mg via INTRAVENOUS
  Filled 2020-11-20: qty 5

## 2020-11-20 MED ORDER — HEPARIN SOD (PORK) LOCK FLUSH 100 UNIT/ML IV SOLN
500.0000 [IU] | Freq: Once | INTRAVENOUS | Status: AC | PRN
  Administered 2020-11-20: 500 [IU]
  Filled 2020-11-20: qty 5

## 2020-11-20 MED ORDER — SODIUM CHLORIDE 0.9 % IV SOLN
10.0000 mg | Freq: Once | INTRAVENOUS | Status: AC
  Administered 2020-11-20: 10 mg via INTRAVENOUS
  Filled 2020-11-20: qty 1

## 2020-11-20 NOTE — Progress Notes (Signed)
Patient received prescribed treatment in clinic. Cycle 1 of Cisplatin. Tolerated well. Patient stable at discharge.

## 2020-11-21 ENCOUNTER — Ambulatory Visit
Admission: RE | Admit: 2020-11-21 | Discharge: 2020-11-21 | Disposition: A | Discharge status: Home / Self Care | Payer: Medicare Other | Source: Ambulatory Visit | Attending: Radiation Oncology | Admitting: Radiation Oncology

## 2020-11-21 DIAGNOSIS — Z510 Encounter for antineoplastic radiation therapy: Secondary | ICD-10-CM | POA: Diagnosis not present

## 2020-11-22 ENCOUNTER — Ambulatory Visit
Admission: RE | Admit: 2020-11-22 | Discharge: 2020-11-22 | Disposition: A | Discharge status: Home / Self Care | Payer: Medicare Other | Source: Ambulatory Visit | Attending: Radiation Oncology | Admitting: Radiation Oncology

## 2020-11-22 DIAGNOSIS — Z510 Encounter for antineoplastic radiation therapy: Secondary | ICD-10-CM | POA: Diagnosis not present

## 2020-11-23 ENCOUNTER — Encounter (HOSPITAL_COMMUNITY): Admission: RE | Payer: Self-pay | Source: Home / Self Care

## 2020-11-23 ENCOUNTER — Ambulatory Visit
Admission: RE | Admit: 2020-11-23 | Discharge: 2020-11-23 | Disposition: A | Discharge status: Home / Self Care | Payer: Medicare Other | Source: Ambulatory Visit | Attending: Radiation Oncology | Admitting: Radiation Oncology

## 2020-11-23 ENCOUNTER — Ambulatory Visit (HOSPITAL_COMMUNITY): Admission: RE | Admit: 2020-11-23 | Payer: Medicare Other | Source: Home / Self Care | Admitting: Gastroenterology

## 2020-11-23 DIAGNOSIS — Z510 Encounter for antineoplastic radiation therapy: Secondary | ICD-10-CM | POA: Diagnosis not present

## 2020-11-23 SURGERY — UPPER ENDOSCOPIC ULTRASOUND (EUS) RADIAL
Anesthesia: Monitor Anesthesia Care

## 2020-11-23 NOTE — Progress Notes (Signed)
[facility]  [address], [address] [place], [place] [address] Phone: [phone]  Fax: [phone]   Clinic Day:  11/27/2020  Referring physician: Derinda Late, MD  Chief Complaint: Sean Garner. is a 75 y.o. 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  HPI:  The patient was last seen in the medical oncology clinic on 11/20/2020. At that time, he felt anxious.  Exam revealed a stable right neck mass.  Hematocrit was 41.8, hemoglobin 14.5, platelets 252,000, WBC 9,800 (ANC 8000). Sodium was 132. Glucose was 233. Magnesium was 2.0. He received week #1 cisplatin.  The patient is currently receiving IMRT treatment to the tongue. He has received 6 treatments thus far. The plan is for 70 Gy in 35 fractions.  During the interim, he has been "tired." He slept from 9 pm to 6:30 am last night.  Sleep was described as restless.  He voided a lot.  He took 2 Benadryl and a melatonin around 9 pm to sleep at night. He denies nausea, vomiting, and diarrhea. His appetite is good.  His blood sugar was a little bit high after he took Decadron post chemotherapy last week (highest 240). The steroids make him anxious.  Per his wife, his thinking was "not very good" this weekend. He was very anxious and refused to go out to lunch. The patient's wife states that she has never seen him like this before. When he saw his psychiatrist years ago, he was suicidal.  He saw Dr Weber Cooks in the past.  He is very anxious about radiation. He takes Xanax 0.25 before he goes to radiation. He states that "maybe if he took a little bit more, he would be fine for radiation." He is seeing Billey Chang, NP by phone tomorrow but they would like to switch to an in-person appointment.  The patient does not know today's month, day, or year. He does not know  the President. He was given 3 words and was unable to remember them after 10 minutes. He was able to remember one of them with prompting.   Past Medical History:  Diagnosis Date  . Anxiety   . Cancer [facility])    Head/throat Cancer at the base of the tongue  . Cataract    lt eye repair; present in rt eye but not ripe yet.  . Claustrophobia   . Diabetes mellitus without complication ([place])   . GERD (gastroesophageal reflux disease)   . Glaucoma    using gtts for this.  . Hypercholesterolemia   . Hypertension     Past Surgical History:  Procedure Laterality Date  . CATARACT EXTRACTION W/PHACO Left 12/03/2017   Procedure: CATARACT EXTRACTION PHACO AND INTRAOCULAR LENS PLACEMENT ([place]) COMPLICATED DIABETIC LEFT;  Surgeon: Leandrew Koyanagi, MD;  Location: [facility];  Service: Ophthalmology;  Laterality: Left;  Diabetic - oral meds  . CHOLECYSTECTOMY    . COLONOSCOPY  2016  . PORTA CATH INSERTION N/A 11/13/2020   Procedure: PORTA CATH INSERTION;  Surgeon: Algernon Huxley, MD;  Location: [facility];  Service: Cardiovascular;  Laterality: N/A;    Family History  Problem Relation Age of Onset  . Cancer Mother   . Colon cancer Neg Hx   . Colon polyps Neg Hx   . Esophageal cancer Neg Hx   . Rectal cancer Neg Hx   .  Stomach cancer Neg Hx     Social History:  reports that he quit smoking about 36 years ago. He has never used smokeless tobacco. He reports previous alcohol use. He reports that he does not use drugs. He quit smoking cold [place] in 1986. He smoked 2 packs per day for 20 years. He drinks a glass of wine or a Margarita occasionally. 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 He was exposed to [employer]. He worked with a Architect for 34 years. The patient is accompanied by his wife in person today.  Allergies: No Known Allergies  Current Medications: Current Outpatient Medications  Medication Sig Dispense Refill  . ALPRAZolam (XANAX) 0.5  MG tablet Take 1 tablet (0.5 mg total) by mouth 3 (three) times daily as needed for anxiety. 45 tablet 0  . brimonidine-timolol (COMBIGAN) 0.2-0.5 % ophthalmic solution Place 1 drop into the left eye daily.    . citalopram (CELEXA) 20 MG tablet Take 1 tablet (20 mg total) by mouth daily. 30 tablet 3  . dexamethasone (DECADRON) 4 MG tablet Take 2 tablets (8 mg total) by mouth daily. Take daily x 3 days starting the day after cisplatin chemotherapy. Take with food. 30 tablet 1  . diphenhydrAMINE HCl (BENADRYL PO) Take by mouth.    . esomeprazole (NEXIUM) 40 MG capsule Take 40 mg by mouth as needed.    . ezetimibe-simvastatin (VYTORIN) 10-20 MG tablet Take 1 tablet by mouth daily at 6 PM.    . glipiZIDE (GLUCOTROL XL) 5 MG 24 hr tablet Take 5 mg by mouth daily.  1  . glucose blood test strip CHECK FASTING BLOOD SUGAR EVERY MORNING. (NEED SIGNATURE)    . lidocaine-prilocaine (EMLA) cream Apply to affected area once 30 g 3  . losartan-hydrochlorothiazide (HYZAAR) 50-12.5 MG tablet Take 1 tablet by mouth daily.    . metFORMIN (GLUCOPHAGE-XR) 500 MG 24 hr tablet Take 500 mg by mouth 2 (two) times daily.    Marland Kitchen aspirin EC 81 MG tablet Take by mouth.    . ondansetron (ZOFRAN) 8 MG tablet Take 1 tablet (8 mg total) by mouth 2 (two) times daily as needed. Start on the third day after cisplatin chemotherapy. (Patient not taking: Reported on 11/27/2020) 30 tablet 1  . pantoprazole (PROTONIX) 40 MG tablet Take 40 mg by mouth daily. (Patient not taking: No sig reported)    . prochlorperazine (COMPAZINE) 10 MG tablet Take 1 tablet (10 mg total) by mouth every 6 (six) hours as needed (Nausea or vomiting). (Patient not taking: Reported on 11/27/2020) 30 tablet 1  . simvastatin (ZOCOR) 40 MG tablet Take by mouth. (Patient not taking: No sig reported)     Current Facility-Administered Medications  Medication Dose Route Frequency Provider Last Rate Last Admin  . 0.9 %  sodium chloride infusion  500 mL Intravenous Once  Pyrtle, Carie Caddy, MD       Facility-Administered Medications Ordered in Other Visits  Medication Dose Route Frequency Provider Last Rate Last Admin  . 0.9 % NaCl with KCl 20 mEq/ L  infusion   Intravenous Once Blaze Sandin C, MD      . CISplatin (PLATINOL) 87 mg in sodium chloride 0.9 % 250 mL chemo infusion  40 mg/m2 (Order-Specific) Intravenous Once Nelva Nay C, MD      . dexamethasone (DECADRON) 10 mg in sodium chloride 0.9 % 50 mL IVPB  10 mg Intravenous Once Rosey Bath, MD      . fosaprepitant (  EMEND) 150 mg in sodium chloride 0.9 % 145 mL IVPB  150 mg Intravenous Once Andrewjames Weirauch C, MD      . magnesium sulfate IVPB 2 g 50 mL  2 g Intravenous Once Lequita Asal, MD        Review of Systems  Constitutional: Positive for weight loss (4 lbs). Negative for chills, diaphoresis, fever and malaise/fatigue.       Feels "anxious."  HENT: Positive for hearing loss. Negative for congestion, ear discharge, ear pain, nosebleeds, sinus pain, sore throat and tinnitus.   Eyes: Negative for blurred vision.  Respiratory: Negative for cough, hemoptysis, sputum production and shortness of breath.   Cardiovascular: Negative for chest pain, palpitations and leg swelling.  Gastrointestinal: Negative for abdominal pain, blood in stool, constipation, diarrhea, heartburn (takes Nexium), melena, nausea and vomiting.       Good appetite.  Genitourinary: Negative for dysuria, frequency, hematuria and urgency.  Musculoskeletal: Negative for back pain, joint pain, myalgias and neck pain.  Skin: Negative for itching and rash.  Neurological: Negative for dizziness, tingling, sensory change, weakness and headaches.  Endo/Heme/Allergies: Does not bruise/bleed easily.  Psychiatric/Behavioral: Negative for depression and memory loss. The patient is nervous/anxious (on citalopram and xanax). The patient does not have insomnia.        Thinking was "not very good" this weekend.  All other  systems reviewed and are negative.  Performance status (ECOG): 1  Vitals Blood pressure (!) 149/68, pulse 72, temperature (!) 97.3 F (36.3 C), temperature source Tympanic, resp. rate 18, SpO2 98 %.   Physical Exam Vitals and nursing note reviewed.  Constitutional:      General: He is not in acute distress.    Appearance: He is not diaphoretic.  HENT:     Head: Normocephalic and atraumatic.     Comments: Gray hair.    Mouth/Throat:     Mouth: Mucous membranes are moist.     Pharynx: Oropharynx is clear.  Eyes:     General: No scleral icterus.    Extraocular Movements: Extraocular movements intact.     Conjunctiva/sclera: Conjunctivae normal.     Pupils: Pupils are equal, round, and reactive to light.     Comments: Blue eyes.  Cardiovascular:     Rate and Rhythm: Normal rate and regular rhythm.     Heart sounds: Normal heart sounds. No murmur heard.   Pulmonary:     Effort: Pulmonary effort is normal. No respiratory distress.     Breath sounds: Normal breath sounds. No wheezing or rales.  Chest:     Chest wall: No tenderness.  Breasts:     Right: No axillary adenopathy or supraclavicular adenopathy.     Left: No axillary adenopathy or supraclavicular adenopathy.    Abdominal:     General: Bowel sounds are normal. There is no distension.     Palpations: Abdomen is soft. There is no mass.     Tenderness: There is no abdominal tenderness. There is no guarding or rebound.  Musculoskeletal:        General: No swelling or tenderness. Normal range of motion.     Cervical back: Normal range of motion and neck supple.  Lymphadenopathy:     Head:     Right side of head: No preauricular, posterior auricular or occipital adenopathy.     Left side of head: No preauricular, posterior auricular or occipital adenopathy.     Upper Body:     Right upper body: No supraclavicular or  axillary adenopathy.     Left upper body: No supraclavicular or axillary adenopathy.     Lower Body:  No right inguinal adenopathy. No left inguinal adenopathy.  Skin:    General: Skin is warm and dry.  Neurological:     Mental Status: He is alert and oriented to person, place, and time.     Comments: Cranial nerves II-XII intact. RAM normal. Finger-to-nose test normal. Strength and sensation are normal and symmetric.  The patient does not know today's month, day, or year. He does not know the President. Can repeat 3 words immediately, but can remember only 1 of 3 words with prompting after 10 minutes.  Psychiatric:        Behavior: Behavior normal.        Thought Content: Thought content normal.        Judgment: Judgment normal.     Comments: Anxious.    No visits with results within 3 Day(s) from this visit.  Latest known visit with results is:  Appointment on 11/24/2020  Component Date Value Ref Range Status  . WBC 11/24/2020 13.7* 4.0 - 10.5 K/uL Final  . RBC 11/24/2020 4.99  4.22 - 5.81 MIL/uL Final  . Hemoglobin 11/24/2020 14.9  13.0 - 17.0 g/dL Final  . HCT 19/16/6060 42.7  39.0 - 52.0 % Final  . MCV 11/24/2020 85.6  80.0 - 100.0 fL Final  . MCH 11/24/2020 29.9  26.0 - 34.0 pg Final  . MCHC 11/24/2020 34.9  30.0 - 36.0 g/dL Final  . RDW 04/59/9774 12.3  11.5 - 15.5 % Final  . Platelets 11/24/2020 299  150 - 400 K/uL Final  . nRBC 11/24/2020 0.0  0.0 - 0.2 % Final   Performed at [facility], [address] [place] [place], [place] [address]    Assessment:  Sean Griffin. is a 75 y.o. 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  He presented with right neck adenopathy.    Neck soft tissue CT on 09/28/2020 revealed a 3.5 x 3.0 x 4.8 cm right neck mass most c/w enlarged lymph node. There was a 14 mm enhancing mass in the right base of tongue.   PET scan on 10/25/2020 revealed  right tongue base primary with ipsilateral level 2 nodal metastasis. There were no findings of extracervical metastatic disease. There was focal hypermetabolism in the distal esophagus, with possible concurrent soft tissue density lesion. Recommended correlation with endoscopy. There was vague right lower lobe low-level hypermetabolism and ground-glass nodularity, favored minimal infection or aspiration. Incidental findings included aortic atherosclerosis, coronary artery atherosclerosis, emphysema, a tiny hiatal hernia, and prostatomegaly.  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  Speech discrimination scores were 100% in each ear.  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  Upper endoscopy on 11/03/2020 revealed a malignant esophageal tumor at the gastroesophageal junction. There were esophageal mucosal changes c/w short-segment Barrett's esophagus. There was a 2 cm hiatal hernia. There was gastritis.  Pathology in the esophagus at 36 cm revealed intramucosal adenocarcinoma arising in a background of high grade dysplasia and intestinal metaplasia; pathology at 38 cm revealed adenocarcinoma.  EUS on 11/15/2020 revealed early stage adenocarcinoma arising from Barrett's esophagus.  Lesion was T1bN0 and non-obstructing.  He has PTSD and clastrophobia.  Anxiety is relieved by alprazolam.  He is also on citalopram.  Symptomatically, he is anxious today.  He slept poorly despite taking melatonin and 2 Benadryl last night.  He took Xanax prior to radiation this morning.  He is alert and oriented to person only.  Weight is down 4 pounds.  Exam is normal except for mental status exam.  Sodium is 126.  Plan: 1.   Labs today: CBC with diff, CMP, Mg. 2.   Clinical stage I right base of tongue carcinoma             Clinical stage I.  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             PET scan  on 10/25/2020 revealed a right base of tongue primary with ipsilateral level 2 nodal metastasis.                         No evidence of distant metastasis.             Plan: cisplatin 40 mg/m2 weekly x 7 with radiation.  He began concurrent radiation and cisplatin on 11/20/2020.             Labs reviewed.  Hold chemotherapy today secondary to altered mental status. 3.   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             EUS on 11/15/2020 revealed a T1bN0 lesion.             He may be a candidate for endoscopic resection followed by ablation or esphagectomy.             Patient will be referred to surgery for evaluation. 4.   Anxiety             Patient's anxiety level has increased despite treatment going well last week.  Contact Dr. Alethia Berthold- done.   Patient may have been seen years ago.   He will need a new referral to psychiatry.  Patient to see Billey Chang, NP today at 2 pm.   Billey Chang, NP contacted directly. 5.   Altered mental status  Etiology felt secondary to anxiety, poor sleep, Benadryl and melatonin last night and Xanax this morning.  No evidence of infection.  Additional labs sent: urinalysis, urine drug screen, ammonia level.  Hyponatremia may be contributing.  His mentation improved while in infusion center when he was receiving IVF (NS) and relaxing. 6.   Hyponatremia  Sodium 126.  Patient notes voiding a lot.  Weight down 4 pounds.  Additional labs: urine specific gravity, urine and serum osmolality, urine Na, TSH.     Unable to send cortisol secondary to time of day.  Patient on losartan-HCTZ.  Contact Dr Elzie Rings office regarding discontinuation of HCTZ. 7.   Chemotherapy currently on hold. 8.   RTC tomorrow for MD assessment, labs (BMP), and  +/- cisplatin.  I discussed the assessment and treatment plan with the patient.  The patient was provided an opportunity to ask questions and all were answered.  The patient agreed with the plan and demonstrated an understanding  of the instructions.  The patient was advised to call back if the symptoms worsen or if the condition fails to improve as anticipated.  I provided 28 minutes of face-to-face time during this this encounter and > 50% was spent counseling as documented under my assessment and plan.  An additional 10 minutes were spent reviewing his chart (Epic and Care Everywhere) including notes, labs, and imaging studies and discussing with providers.    Kynnedi Zweig C. Mike Gip, MD, PhD    11/27/2020, 10:32 AM  I, Mirian Mo Tufford, am acting as Education administrator for [employer]. Mike Gip, MD, PhD.  I, Georgie Haque C. Mike Gip, MD, have reviewed the above documentation for accuracy and completeness, and I agree with the above.

## 2020-11-24 ENCOUNTER — Ambulatory Visit
Admission: RE | Admit: 2020-11-24 | Discharge: 2020-11-24 | Disposition: A | Discharge status: Home / Self Care | Payer: Medicare Other | Source: Ambulatory Visit | Attending: Radiation Oncology | Admitting: Radiation Oncology

## 2020-11-24 ENCOUNTER — Telehealth: Payer: Self-pay

## 2020-11-24 ENCOUNTER — Inpatient Hospital Stay: Payer: Medicare Other

## 2020-11-24 ENCOUNTER — Other Ambulatory Visit: Payer: Self-pay

## 2020-11-24 DIAGNOSIS — Z510 Encounter for antineoplastic radiation therapy: Secondary | ICD-10-CM | POA: Diagnosis not present

## 2020-11-24 DIAGNOSIS — Z5111 Encounter for antineoplastic chemotherapy: Secondary | ICD-10-CM | POA: Diagnosis not present

## 2020-11-24 DIAGNOSIS — C01 Malignant neoplasm of base of tongue: Secondary | ICD-10-CM

## 2020-11-24 LAB — CBC
HCT: 42.7 % (ref 39.0–52.0)
Hemoglobin: 14.9 g/dL (ref 13.0–17.0)
MCH: 29.9 pg (ref 26.0–34.0)
MCHC: 34.9 g/dL (ref 30.0–36.0)
MCV: 85.6 fL (ref 80.0–100.0)
Platelets: 299 10*3/uL (ref 150–400)
RBC: 4.99 MIL/uL (ref 4.22–5.81)
RDW: 12.3 % (ref 11.5–15.5)
WBC: 13.7 10*3/uL — ABNORMAL HIGH (ref 4.0–10.5)
nRBC: 0 % (ref 0.0–0.2)

## 2020-11-24 NOTE — Telephone Encounter (Signed)
Telephone call to patient for follow up after receiving first infusion.   Patient states infusion went great.  States eating good and drinking plenty of fluids.   Denies any nausea or vomiting.  Encouraged patient to call for any concerns or questions. 

## 2020-11-27 ENCOUNTER — Other Ambulatory Visit: Payer: Self-pay

## 2020-11-27 ENCOUNTER — Other Ambulatory Visit: Payer: Self-pay | Admitting: Hematology and Oncology

## 2020-11-27 ENCOUNTER — Ambulatory Visit
Admission: RE | Admit: 2020-11-27 | Discharge: 2020-11-27 | Disposition: A | Discharge status: Home / Self Care | Payer: Medicare Other | Source: Ambulatory Visit | Attending: Radiation Oncology | Admitting: Radiation Oncology

## 2020-11-27 ENCOUNTER — Telehealth: Payer: Medicare Other | Admitting: Hospice and Palliative Medicine

## 2020-11-27 ENCOUNTER — Inpatient Hospital Stay (HOSPITAL_BASED_OUTPATIENT_CLINIC_OR_DEPARTMENT_OTHER): Payer: Medicare Other | Admitting: Hematology and Oncology

## 2020-11-27 ENCOUNTER — Telehealth: Payer: Self-pay

## 2020-11-27 ENCOUNTER — Encounter: Payer: Self-pay | Admitting: Hematology and Oncology

## 2020-11-27 ENCOUNTER — Inpatient Hospital Stay (HOSPITAL_BASED_OUTPATIENT_CLINIC_OR_DEPARTMENT_OTHER): Payer: Medicare Other | Admitting: Hospice and Palliative Medicine

## 2020-11-27 ENCOUNTER — Inpatient Hospital Stay: Payer: Medicare Other

## 2020-11-27 ENCOUNTER — Ambulatory Visit: Payer: Medicare Other

## 2020-11-27 VITALS — BP 149/68 | HR 72 | Temp 97.3°F | Resp 18

## 2020-11-27 DIAGNOSIS — C01 Malignant neoplasm of base of tongue: Secondary | ICD-10-CM

## 2020-11-27 DIAGNOSIS — F419 Anxiety disorder, unspecified: Secondary | ICD-10-CM | POA: Diagnosis not present

## 2020-11-27 DIAGNOSIS — E871 Hypo-osmolality and hyponatremia: Secondary | ICD-10-CM

## 2020-11-27 DIAGNOSIS — F339 Major depressive disorder, recurrent, unspecified: Secondary | ICD-10-CM | POA: Diagnosis not present

## 2020-11-27 DIAGNOSIS — Z5111 Encounter for antineoplastic chemotherapy: Secondary | ICD-10-CM

## 2020-11-27 DIAGNOSIS — R4182 Altered mental status, unspecified: Secondary | ICD-10-CM

## 2020-11-27 DIAGNOSIS — Z510 Encounter for antineoplastic radiation therapy: Secondary | ICD-10-CM | POA: Diagnosis not present

## 2020-11-27 DIAGNOSIS — C159 Malignant neoplasm of esophagus, unspecified: Secondary | ICD-10-CM

## 2020-11-27 LAB — BASIC METABOLIC PANEL
Anion gap: 17 — ABNORMAL HIGH (ref 5–15)
BUN: 15 mg/dL (ref 8–23)
CO2: 22 mmol/L (ref 22–32)
Calcium: 8.9 mg/dL (ref 8.9–10.3)
Chloride: 87 mmol/L — ABNORMAL LOW (ref 98–111)
Creatinine, Ser: 0.84 mg/dL (ref 0.61–1.24)
GFR, Estimated: >60 mL/min (ref >60)
Glucose, Bld: 220 mg/dL — ABNORMAL HIGH (ref 70–99)
Potassium: 3.9 mmol/L (ref 3.5–5.1)
Sodium: 126 mmol/L — ABNORMAL LOW (ref 135–145)

## 2020-11-27 LAB — URINE DRUG SCREEN, QUALITATIVE (ARMC ONLY)
Amphetamines, Ur Screen: NOT DETECTED
Barbiturates, Ur Screen: NOT DETECTED
Benzodiazepine, Ur Scrn: POSITIVE — AB
Cannabinoid 50 Ng, Ur ~~LOC~~: NOT DETECTED
Cocaine Metabolite,Ur ~~LOC~~: NOT DETECTED
MDMA (Ecstasy)Ur Screen: NOT DETECTED
Methadone Scn, Ur: NOT DETECTED
Opiate, Ur Screen: NOT DETECTED
Phencyclidine (PCP) Ur S: NOT DETECTED
Tricyclic, Ur Screen: NOT DETECTED

## 2020-11-27 LAB — CBC WITH DIFFERENTIAL/PLATELET
Abs Immature Granulocytes: 0.18 10*3/uL — ABNORMAL HIGH (ref 0.00–0.07)
Basophils Absolute: 0 10*3/uL (ref 0.0–0.1)
Basophils Relative: 0 %
Eosinophils Absolute: 0.1 10*3/uL (ref 0.0–0.5)
Eosinophils Relative: 0 %
HCT: 42.8 % (ref 39.0–52.0)
Hemoglobin: 14.9 g/dL (ref 13.0–17.0)
Immature Granulocytes: 1 %
Lymphocytes Relative: 6 %
Lymphs Abs: 0.8 10*3/uL (ref 0.7–4.0)
MCH: 29.3 pg (ref 26.0–34.0)
MCHC: 34.8 g/dL (ref 30.0–36.0)
MCV: 84.3 fL (ref 80.0–100.0)
Monocytes Absolute: 1.1 10*3/uL — ABNORMAL HIGH (ref 0.1–1.0)
Monocytes Relative: 8 %
Neutro Abs: 10.8 10*3/uL — ABNORMAL HIGH (ref 1.7–7.7)
Neutrophils Relative %: 85 %
Platelets: 272 10*3/uL (ref 150–400)
RBC: 5.08 MIL/uL (ref 4.22–5.81)
RDW: 12.1 % (ref 11.5–15.5)
WBC: 12.9 10*3/uL — ABNORMAL HIGH (ref 4.0–10.5)
nRBC: 0 % (ref 0.0–0.2)

## 2020-11-27 LAB — URINALYSIS, COMPLETE (UACMP) WITH MICROSCOPIC
Bacteria, UA: NONE SEEN
Bilirubin Urine: NEGATIVE
Glucose, UA: 500 mg/dL — AB
Hgb urine dipstick: NEGATIVE
Ketones, ur: NEGATIVE mg/dL
Leukocytes,Ua: NEGATIVE
Nitrite: NEGATIVE
Protein, ur: NEGATIVE mg/dL
RBC / HPF: NONE SEEN RBC/hpf (ref 0–5)
Specific Gravity, Urine: 1.02 (ref 1.005–1.030)
Squamous Epithelial / HPF: NONE SEEN (ref 0–5)
WBC, UA: NONE SEEN WBC/hpf (ref 0–5)
pH: 6 (ref 5.0–8.0)

## 2020-11-27 LAB — COMPREHENSIVE METABOLIC PANEL
ALT: 17 U/L (ref 0–44)
AST: 15 U/L (ref 15–41)
Albumin: 3.9 g/dL (ref 3.5–5.0)
Alkaline Phosphatase: 49 U/L (ref 38–126)
Anion gap: 18 — ABNORMAL HIGH (ref 5–15)
BUN: 15 mg/dL (ref 8–23)
CO2: 20 mmol/L — ABNORMAL LOW (ref 22–32)
Calcium: 8.8 mg/dL — ABNORMAL LOW (ref 8.9–10.3)
Chloride: 87 mmol/L — ABNORMAL LOW (ref 98–111)
Creatinine, Ser: 0.87 mg/dL (ref 0.61–1.24)
GFR, Estimated: >60 mL/min (ref >60)
Glucose, Bld: 217 mg/dL — ABNORMAL HIGH (ref 70–99)
Potassium: 3.9 mmol/L (ref 3.5–5.1)
Sodium: 125 mmol/L — ABNORMAL LOW (ref 135–145)
Total Bilirubin: 1.2 mg/dL (ref 0.3–1.2)
Total Protein: 7.2 g/dL (ref 6.5–8.1)

## 2020-11-27 LAB — MAGNESIUM: Magnesium: 2.1 mg/dL (ref 1.7–2.4)

## 2020-11-27 LAB — TSH: TSH: 2.077 u[IU]/mL (ref 0.350–4.500)

## 2020-11-27 LAB — OSMOLALITY, URINE: Osmolality, Ur: 519 mOsm/kg (ref 300–900)

## 2020-11-27 LAB — AMMONIA: Ammonia: 19 umol/L (ref 9–35)

## 2020-11-27 LAB — OSMOLALITY: Osmolality: 276 mOsm/kg (ref 275–295)

## 2020-11-27 LAB — SODIUM, URINE, RANDOM: Sodium, Ur: 114 mmol/L

## 2020-11-27 LAB — URIC ACID: Uric Acid, Serum: 3.2 mg/dL — ABNORMAL LOW (ref 3.7–8.6)

## 2020-11-27 MED ORDER — POTASSIUM CHLORIDE IN NACL 20-0.9 MEQ/L-% IV SOLN
Freq: Once | INTRAVENOUS | Status: AC
  Filled 2020-11-27: qty 1000

## 2020-11-27 MED ORDER — SODIUM CHLORIDE 0.9 % IV SOLN: Freq: Once | INTRAVENOUS | Status: DC

## 2020-11-27 MED ORDER — CITALOPRAM HYDROBROMIDE 40 MG PO TABS: 40.0000 mg | ORAL_TABLET | Freq: Every day | ORAL | 2 refills | Status: DC

## 2020-11-27 MED ORDER — TRAZODONE HCL 50 MG PO TABS: 25.0000 mg | ORAL_TABLET | Freq: Every evening | ORAL | 2 refills | Status: DC | PRN

## 2020-11-27 MED ORDER — SODIUM CHLORIDE 0.9 % IV SOLN
10.0000 mg | Freq: Once | INTRAVENOUS | Status: DC
  Filled 2020-11-27: qty 1

## 2020-11-27 MED ORDER — MAGNESIUM SULFATE 2 GM/50ML IV SOLN: 2.0000 g | Freq: Once | INTRAVENOUS | Status: AC

## 2020-11-27 MED ORDER — SODIUM CHLORIDE 0.9 % IV SOLN
40.0000 mg/m2 | Freq: Once | INTRAVENOUS | Status: DC
  Filled 2020-11-27: qty 87

## 2020-11-27 MED ORDER — SODIUM CHLORIDE 0.9 % IV SOLN
150.0000 mg | Freq: Once | INTRAVENOUS | Status: DC
  Filled 2020-11-27: qty 5

## 2020-11-27 MED ORDER — SODIUM CHLORIDE 0.9 % IV SOLN
INTRAVENOUS | Status: DC
  Filled 2020-11-27 (×2): qty 250

## 2020-11-27 MED ORDER — ALPRAZOLAM 0.25 MG PO TABS: 0.2500 mg | ORAL_TABLET | Freq: Every evening | ORAL | 0 refills | Status: DC | PRN

## 2020-11-27 NOTE — Progress Notes (Signed)
No tx today, one hour of hydration, pt scheduled to meet with J. Borders NP in [place] this afternoon. Pt has some episodes of confusion, voiding without difficulty,

## 2020-11-27 NOTE — Progress Notes (Signed)
[place]  [phone] [phone]   Name: Sean Garner. Date: 11/27/2020 MRN: [id number]  DOB: 21-Jul-1946  Patient Care Team: Derinda Late, MD as PCP - General (Family Medicine) Lequita Asal, MD as Referring Physician (Hematology and Oncology) Beverly Gust, MD (Otolaryngology) Pyrtle, Lajuan Lines, MD as Consulting Physician (Gastroenterology) Clapacs, Madie Reno, MD (Psychiatry)    REASON FOR CONSULTATION: Sean Garner. is a 75 y.o. 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  Patient has severe claustrophobia limiting work-up as he refused PET/CT.  Patient has had severe anxiety and was referred to palliative care to help address goals and manage ongoing symptoms.   SOCIAL HISTORY:     reports that he quit smoking about 36 years ago. He has never used smokeless tobacco. He reports previous alcohol use. He reports that he does not use drugs.  Patient is married and lives at home with his wife.  He has a daughter who lives nearby.  Patient is a [place] veteran and has PTSD.  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  ADVANCE DIRECTIVES:  Not on file  CODE STATUS:   PAST MEDICAL HISTORY: Past Medical History:  Diagnosis Date  . Anxiety   . Cancer [facility])    Head/throat Cancer at the base of the tongue  . Cataract    lt eye repair; present in rt eye but not ripe yet.  . Claustrophobia   . Diabetes mellitus without complication ([place])   . GERD (gastroesophageal reflux disease)   . Glaucoma    using gtts for this.  . Hypercholesterolemia   . Hypertension     PAST SURGICAL HISTORY:  Past Surgical History:  Procedure Laterality Date  . CATARACT EXTRACTION W/PHACO Left 12/03/2017   Procedure: CATARACT EXTRACTION PHACO AND INTRAOCULAR LENS  PLACEMENT ([place]) COMPLICATED DIABETIC LEFT;  Surgeon: Leandrew Koyanagi, MD;  Location: [place];  Service: Ophthalmology;  Laterality: Left;  Diabetic - oral meds  . CHOLECYSTECTOMY    . COLONOSCOPY  2016  . PORTA CATH INSERTION N/A 11/13/2020   Procedure: PORTA CATH INSERTION;  Surgeon: Algernon Huxley, MD;  Location: [place] [facility];  Service: Cardiovascular;  Laterality: N/A;    HEMATOLOGY/ONCOLOGY HISTORY:  Oncology History  Carcinoma of base of tongue ([place])  10/17/2020 Initial Diagnosis   Carcinoma of base of tongue ([place])   11/20/2020 -  Chemotherapy    Patient is on Treatment Plan: HEAD/NECK CISPLATIN Q7D        ALLERGIES:  has No Known Allergies.  MEDICATIONS:  Current Outpatient Medications  Medication Sig Dispense Refill  . ALPRAZolam (XANAX) 0.5 MG tablet Take 1 tablet (0.5 mg total) by mouth 3 (three) times daily as needed for anxiety. 45 tablet 0  . aspirin EC 81 MG tablet Take by mouth.    . brimonidine-timolol (COMBIGAN) 0.2-0.5 % ophthalmic solution Place 1 drop into the left eye daily.    Marland Kitchen dexamethasone (DECADRON) 4 MG tablet Take 2 tablets (8 mg total) by mouth daily. Take daily x 3 days starting the day after cisplatin chemotherapy. Take with food. 30 tablet 1  . diphenhydrAMINE HCl (BENADRYL PO) Take by mouth.    . esomeprazole (NEXIUM) 40 MG capsule Take 40 mg by mouth as needed.    . ezetimibe-simvastatin (VYTORIN) 10-20 MG tablet Take  1 tablet by mouth daily at 6 PM.    . glipiZIDE (GLUCOTROL XL) 5 MG 24 hr tablet Take 5 mg by mouth daily.  1  . glucose blood test strip CHECK FASTING BLOOD SUGAR EVERY MORNING. (NEED SIGNATURE)    . lidocaine-prilocaine (EMLA) cream Apply to affected area once 30 g 3  . losartan-hydrochlorothiazide (HYZAAR) 50-12.5 MG tablet Take 1 tablet by mouth daily.    . metFORMIN (GLUCOPHAGE-XR) 500 MG 24 hr tablet Take 500 mg by mouth 2 (two) times daily.    . ondansetron (ZOFRAN) 8 MG tablet Take 1 tablet (8 mg total)  by mouth 2 (two) times daily as needed. Start on the third day after cisplatin chemotherapy. (Patient not taking: Reported on 11/27/2020) 30 tablet 1  . pantoprazole (PROTONIX) 40 MG tablet Take 40 mg by mouth daily. (Patient not taking: No sig reported)    . prochlorperazine (COMPAZINE) 10 MG tablet Take 1 tablet (10 mg total) by mouth every 6 (six) hours as needed (Nausea or vomiting). (Patient not taking: Reported on 11/27/2020) 30 tablet 1  . simvastatin (ZOCOR) 40 MG tablet Take by mouth. (Patient not taking: No sig reported)     Current Facility-Administered Medications  Medication Dose Route Frequency Provider Last Rate Last Admin  . 0.9 %  sodium chloride infusion  500 mL Intravenous Once Pyrtle, Lajuan Lines, MD       Facility-Administered Medications Ordered in Other Visits  Medication Dose Route Frequency Provider Last Rate Last Admin  . 0.9 %  sodium chloride infusion   Intravenous Continuous Lequita Asal, MD   Stopped at 11/27/20 1331  . 0.9 % NaCl with KCl 20 mEq/ L  infusion   Intravenous Once Corcoran, Melissa C, MD      . CISplatin (PLATINOL) 87 mg in sodium chloride 0.9 % 250 mL chemo infusion  40 mg/m2 (Order-Specific) Intravenous Once Nolon Stalls C, MD      . dexamethasone (DECADRON) 10 mg in sodium chloride 0.9 % 50 mL IVPB  10 mg Intravenous Once Corcoran, Melissa C, MD      . fosaprepitant (EMEND) 150 mg in sodium chloride 0.9 % 145 mL IVPB  150 mg Intravenous Once Corcoran, Melissa C, MD      . magnesium sulfate IVPB 2 g 50 mL  2 g Intravenous Once Lequita Asal, MD        VITAL SIGNS: There were no vitals taken for this visit. There were no vitals filed for this visit.  Estimated body mass index is 30.13 kg/m as calculated from the following:   Height as of 11/13/20: 5' 10.5" (1.791 m).   Weight as of 11/20/20: 212 lb 15.4 oz (96.6 kg).  LABS: CBC:    Component Value Date/Time   WBC 12.9 (H) 11/27/2020 1036   HGB 14.9 11/27/2020 1036   HGB 12.3 (L)  10/01/2013 0508   HCT 42.8 11/27/2020 1036   HCT 35.7 (L) 10/01/2013 0508   PLT 272 11/27/2020 1036   PLT 302 10/01/2013 0508   MCV 84.3 11/27/2020 1036   MCV 89 10/01/2013 0508   NEUTROABS 10.8 (H) 11/27/2020 1036   NEUTROABS 9.8 (H) 10/01/2013 0508   LYMPHSABS 0.8 11/27/2020 1036   LYMPHSABS 1.6 10/01/2013 0508   MONOABS 1.1 (H) 11/27/2020 1036   MONOABS 0.9 10/01/2013 0508   EOSABS 0.1 11/27/2020 1036   EOSABS 0.1 10/01/2013 0508   BASOSABS 0.0 11/27/2020 1036   BASOSABS 0.1 10/01/2013 0508   Comprehensive Metabolic Panel:  Component Value Date/Time   NA 126 (L) 11/27/2020 1036   NA 125 (L) 11/27/2020 1036   NA 135 (L) 09/29/2013 0534   K 3.9 11/27/2020 1036   K 3.9 11/27/2020 1036   K 3.8 09/29/2013 0534   CL 87 (L) 11/27/2020 1036   CL 87 (L) 11/27/2020 1036   CL 101 09/29/2013 0534   CO2 22 11/27/2020 1036   CO2 20 (L) 11/27/2020 1036   CO2 29 09/29/2013 0534   BUN 15 11/27/2020 1036   BUN 15 11/27/2020 1036   BUN 12 09/29/2013 0534   CREATININE 0.84 11/27/2020 1036   CREATININE 0.87 11/27/2020 1036   CREATININE 1.15 09/29/2013 0534   GLUCOSE 220 (H) 11/27/2020 1036   GLUCOSE 217 (H) 11/27/2020 1036   GLUCOSE 258 (H) 09/29/2013 0534   CALCIUM 8.9 11/27/2020 1036   CALCIUM 8.8 (L) 11/27/2020 1036   CALCIUM 8.7 09/29/2013 0534   AST 15 11/27/2020 1036   AST 19 10/01/2013 0508   ALT 17 11/27/2020 1036   ALT 29 10/01/2013 0508   ALKPHOS 49 11/27/2020 1036   ALKPHOS 76 10/01/2013 0508   BILITOT 1.2 11/27/2020 1036   BILITOT 0.6 10/01/2013 0508   PROT 7.2 11/27/2020 1036   PROT 5.7 (L) 10/01/2013 0508   ALBUMIN 3.9 11/27/2020 1036   ALBUMIN 2.4 (L) 10/01/2013 0508    RADIOGRAPHIC STUDIES: PERIPHERAL VASCULAR CATHETERIZATION  Result Date: 11/13/2020 See op note   PERFORMANCE STATUS (ECOG) : 0 - Asymptomatic  Review of Systems Unless otherwise noted, a complete review of systems is negative.  Physical Exam General: NAD Pulmonary:  Unlabored Extremities: no edema, no joint deformities Skin: no rashes Neurological: Grossly nonfocal  IMPRESSION: Patient was an add-on to my clinic schedule today at Dr. Kem Parkinson request for management of anxiety/depression.  Patient seen in the consult room accompanied by his wife and daughter.  Patient and family tell me that he did not have significant improvement with anxiety or depression following dose increase of citalopram to 20 mg daily.  Over the past several days he has had severe daytime anxiety and difficulty initiating sleep at night.  Appetite and oral intake have been poor.  Wife is giving him 0.25 mg of alprazolam prior to XRT and that has helped with managing acute anxiety for treatments.  However, she reports that 0.5 mg dose was causing him to be too loopy.  Patient also endorses depression and has had episodes of tearfulness.  He says that his primary goal is to be able to engage with his family and partake in activities he finds enjoyable.  He feels like anxiety and depression are having a significant negative impact on his quality of life.  Note that patient had some acute confusion when he was seen by Dr. Mike Gip this morning.  He was found to be hyponatremic with work-up pending.  Patient did receive some IV fluids and family feel that this "perked him up."  He was essentially at baseline when I saw him this afternoon.  Patient denies SI or HI.  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  He has chronic anxiety and depression and is no longer followed by psychiatry.  I discussed referral back to psychiatry and patient and family were in agreement.  I called [facility] and first available was in 3 weeks.  Referral sent.  While patient is pending psych eval, will increase his citalopram to 40 mg daily and start him on trazodone at bedtime  to help with sleep.  He can increase his alprazolam to 3 times daily as needed.  PLAN: -Continue  current scope of treatment -Increase citalopram to 40 mg daily -Increase alprazolam 0.25 mg 3 times daily as needed (#60) -Start trazodone 25 to 50 mg nightly as needed -Referral to psychiatry -RTC tomorrow to see Dr. Mike Gip and recheck labs -Follow up My Chart visit in 2-3 weeks  Case and plan discussed with Dr. Mike Gip   Patient expressed understanding and was in agreement with this plan. He also understands that He can call the clinic at any time with any questions, concerns, or complaints.     Time Total: 30 minutes  Visit consisted of counseling and education dealing with the complex and emotionally intense issues of symptom management and palliative care in the setting of serious and potentially life-threatening illness.Greater than 50%  of this time was spent counseling and coordinating care related to the above assessment and plan.  Signed by: Altha Harm, PhD, NP-C

## 2020-11-27 NOTE — Progress Notes (Signed)
[facility]  [address], [address] [place], [place] [address] Phone: [phone]  Fax: [phone]   Clinic Day:  11/28/2020  Referring physician: Derinda Late, MD  Chief Complaint: Sean Garner. is a 75 y.o. 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  HPI:  The patient was last seen in the medical oncology clinic on 11/27/2020. At that time, he was confused.  He was alert and oriented to person only.  He described severe anxiety and poor sleep.  He was voiding constantly.  He denied any nausea, vomiting, or diarrhea.  He denied fever.  He took melatonin and Benadryl 50 mg the night before and Xanax 0.25 mg that morning prior to radiation.  Hematocrit was 42.8, hemoglobin 14.9, platelets 272,000, WBC 12,900. Sodium was 125 and glucose 217. Chemotherapy was held. He received IV fluids (NS). His psychiatrist in the distant past (Dr Sean Garner) was contacted.    Additional labs revealed an ammonia of 19. Urine drug screen was positive for benzodiazepine only. Urinalysis revealed a specific gravity of 1.020 and a glucose of 500 mg/dL. Urine sodium was 114. Serum osmolality was 276 and urine osmolality was 519.  TSH was 2.077.  Uric acid was 3.2.  His PCP was contacted.  Losartan-HCTZ was switched to Losartan.  The patient saw Sean Chang, NP on 11/27/2020.  He felt perked up after his IVF. Citalopram was increased to 40 mg a day.  Alprazolam was increased to 0.25 mg TID prn.  He was to start trazadone 25-50 mg po q hs prn.  He was referred to psychiatry.  He has a follow-up in 2-3 weeks.  During the interim, he has been "better." He took trazodone 50 mg last night and slept well. He feels much better in terms of his thinking. He took Xanax 0.25 mg this morning before radiation.  The patient has been taking 20 mg of  citalopram since 12/05/2019. His wife wants to wait about a month before increasing the dose to 40 mg a day.   Past Medical History:  Diagnosis Date  . Anxiety   . Cancer [facility])    Head/throat Cancer at the base of the tongue  . Cataract    lt eye repair; present in rt eye but not ripe yet.  . Claustrophobia   . Diabetes mellitus without complication ([place])   . GERD (gastroesophageal reflux disease)   . Glaucoma    using gtts for this.  . Hypercholesterolemia   . Hypertension     Past Surgical History:  Procedure Laterality Date  . CATARACT EXTRACTION W/PHACO Left 12/03/2017   Procedure: CATARACT EXTRACTION PHACO AND INTRAOCULAR LENS PLACEMENT ([place]) COMPLICATED DIABETIC LEFT;  Surgeon: Leandrew Koyanagi, MD;  Location: [facility];  Service: Ophthalmology;  Laterality: Left;  Diabetic - oral meds  . CHOLECYSTECTOMY    . COLONOSCOPY  2016  . PORTA CATH INSERTION N/A 11/13/2020   Procedure: PORTA CATH INSERTION;  Surgeon: Algernon Huxley, MD;  Location: [facility];  Service: Cardiovascular;  Laterality: N/A;    Family History  Problem Relation Age of Onset  . Cancer Mother   . Colon cancer Neg Hx   . Colon polyps Neg Hx   . Esophageal cancer Neg Hx   . Rectal cancer Neg Hx   . Stomach cancer Neg Hx  Social History:  reports that he quit smoking about 36 years ago. He has never used smokeless tobacco. He reports previous alcohol use. He reports that he does not use drugs. He quit smoking cold [place] in 1986. He smoked 2 packs per day for 20 years. He drinks a glass of wine or a Margarita occasionally. 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 He was exposed to [employer]. He worked with a Geologist, engineering for 34 years. The patient is accompanied by his wife in person today.  Allergies: No Known Allergies  Current Medications: Current Outpatient Medications  Medication Sig Dispense Refill  . ALPRAZolam (XANAX) 0.25 MG tablet Take 1 tablet  (0.25 mg total) by mouth at bedtime as needed for anxiety. 60 tablet 0  . ALPRAZolam (XANAX) 0.5 MG tablet Take 1 tablet (0.5 mg total) by mouth 3 (three) times daily as needed for anxiety. 45 tablet 0  . aspirin EC 81 MG tablet Take by mouth.    . brimonidine-timolol (COMBIGAN) 0.2-0.5 % ophthalmic solution Place 1 drop into the left eye daily.    . citalopram (CELEXA) 40 MG tablet Take 1 tablet (40 mg total) by mouth daily. 30 tablet 2  . dexamethasone (DECADRON) 4 MG tablet Take 2 tablets (8 mg total) by mouth daily. Take daily x 3 days starting the day after cisplatin chemotherapy. Take with food. 30 tablet 1  . diphenhydrAMINE HCl (BENADRYL PO) Take by mouth.    . esomeprazole (NEXIUM) 40 MG capsule Take 40 mg by mouth as needed.    . ezetimibe-simvastatin (VYTORIN) 10-20 MG tablet Take 1 tablet by mouth daily at 6 PM.    . glipiZIDE (GLUCOTROL XL) 5 MG 24 hr tablet Take 5 mg by mouth daily.  1  . glucose blood test strip CHECK FASTING BLOOD SUGAR EVERY MORNING. (NEED SIGNATURE)    . lidocaine-prilocaine (EMLA) cream Apply to affected area once 30 g 3  . losartan (COZAAR) 100 MG tablet Take by mouth.    . losartan-hydrochlorothiazide (HYZAAR) 50-12.5 MG tablet Take 1 tablet by mouth daily.    . metFORMIN (GLUCOPHAGE-XR) 500 MG 24 hr tablet Take 500 mg by mouth 2 (two) times daily.    . traZODone (DESYREL) 50 MG tablet Take 0.5-1 tablets (25-50 mg total) by mouth at bedtime as needed for sleep. 30 tablet 2  . ondansetron (ZOFRAN) 8 MG tablet Take 1 tablet (8 mg total) by mouth 2 (two) times daily as needed. Start on the third day after cisplatin chemotherapy. (Patient not taking: No sig reported) 30 tablet 1  . pantoprazole (PROTONIX) 40 MG tablet Take 40 mg by mouth daily. (Patient not taking: No sig reported)    . prochlorperazine (COMPAZINE) 10 MG tablet Take 1 tablet (10 mg total) by mouth every 6 (six) hours as needed (Nausea or vomiting). (Patient not taking: No sig reported) 30 tablet 1   . simvastatin (ZOCOR) 40 MG tablet Take by mouth. (Patient not taking: No sig reported)     Current Facility-Administered Medications  Medication Dose Route Frequency Provider Last Rate Last Admin  . 0.9 %  sodium chloride infusion  500 mL Intravenous Once Pyrtle, Lajuan Lines, MD       Facility-Administered Medications Ordered in Other Visits  Medication Dose Route Frequency Provider Last Rate Last Admin  . 0.9 % NaCl with KCl 20 mEq/ L  infusion   Intravenous Once Guadalupe Kerekes C, MD      . CISplatin (PLATINOL) 87 mg in  sodium chloride 0.9 % 250 mL chemo infusion  40 mg/m2 (Order-Specific) Intravenous Once Nolon Stalls C, MD      . dexamethasone (DECADRON) 10 mg in sodium chloride 0.9 % 50 mL IVPB  10 mg Intravenous Once Safira Proffit C, MD      . fosaprepitant (EMEND) 150 mg in sodium chloride 0.9 % 145 mL IVPB  150 mg Intravenous Once Azure Budnick C, MD      . magnesium sulfate IVPB 2 g 50 mL  2 g Intravenous Once Lequita Asal, MD        Review of Systems  Constitutional: Negative for chills, diaphoresis, fever, malaise/fatigue and weight loss (stable).       Feels "better."  HENT: Positive for hearing loss. Negative for congestion, ear discharge, ear pain, nosebleeds, sinus pain, sore throat and tinnitus.   Eyes: Negative for blurred vision.  Respiratory: Negative for cough, hemoptysis, sputum production and shortness of breath.   Cardiovascular: Negative for chest pain, palpitations and leg swelling.  Gastrointestinal: Negative for abdominal pain, blood in stool, constipation, diarrhea, heartburn (takes nexium), melena, nausea and vomiting.       Good appetite.  Genitourinary: Negative for dysuria, frequency, hematuria and urgency.  Musculoskeletal: Negative for back pain, joint pain, myalgias and neck pain.  Skin: Negative for itching and rash.  Neurological: Negative for dizziness, tingling, sensory change, weakness and headaches.  Endo/Heme/Allergies: Does not  bruise/bleed easily.  Psychiatric/Behavioral: Negative for depression and memory loss. The patient is nervous/anxious (on citalopram and xanax). The patient does not have insomnia.        Thinking has improved.  All other systems reviewed and are negative.  Performance status (ECOG): 1  Vitals Blood pressure (!) 156/73, pulse 64, temperature (!) 96.2 F (35.7 C), temperature source Tympanic, resp. rate 16, SpO2 99 %.   Physical Exam Vitals and nursing note reviewed.  Constitutional:      General: He is not in acute distress.    Appearance: He is not diaphoretic.  HENT:     Head: Normocephalic and atraumatic.     Comments: Gray hair.    Mouth/Throat:     Mouth: Mucous membranes are moist.     Pharynx: Oropharynx is clear.  Eyes:     General: No scleral icterus.    Extraocular Movements: Extraocular movements intact.     Conjunctiva/sclera: Conjunctivae normal.     Pupils: Pupils are equal, round, and reactive to light.     Comments: Blue eyes.  Neck:     Comments: 3.5 x 3 cm right neck mass. Cardiovascular:     Rate and Rhythm: Normal rate and regular rhythm.     Heart sounds: Normal heart sounds. No murmur heard.   Pulmonary:     Effort: Pulmonary effort is normal. No respiratory distress.     Breath sounds: Normal breath sounds. No wheezing or rales.  Chest:     Chest wall: No tenderness.  Breasts:     Right: No axillary adenopathy or supraclavicular adenopathy.     Left: No axillary adenopathy or supraclavicular adenopathy.    Abdominal:     General: Bowel sounds are normal. There is no distension.     Palpations: Abdomen is soft. There is no mass.     Tenderness: There is no abdominal tenderness. There is no guarding or rebound.  Musculoskeletal:        General: No swelling or tenderness. Normal range of motion.     Cervical back: Normal range  of motion and neck supple.  Lymphadenopathy:     Head:     Right side of head: No preauricular, posterior auricular or  occipital adenopathy.     Left side of head: No preauricular, posterior auricular or occipital adenopathy.     Upper Body:     Right upper body: No supraclavicular or axillary adenopathy.     Left upper body: No supraclavicular or axillary adenopathy.     Lower Body: No right inguinal adenopathy. No left inguinal adenopathy.  Skin:    General: Skin is warm and dry.  Neurological:     Mental Status: He is alert and oriented to person, place, and time.     Comments: The patient knows today's date. He knows the Neurosurgeon.   He is unable to remember 3 words after 10 minutes. He remembered 1 word with prompting.  Psychiatric:        Behavior: Behavior normal.        Thought Content: Thought content normal.        Judgment: Judgment normal.    Orders Only on 11/27/2020  Component Date Value Ref Range Status  . Uric Acid, Serum 11/27/2020 3.2* 3.7 - 8.6 mg/dL Final   Performed at [facility], [address], [place], [place] [address]  . TSH 11/27/2020 2.077  0.350 - 4.500 uIU/mL Final   Comment: Performed by a 3rd Generation assay with a functional sensitivity of <=0.01 uIU/mL. Performed at [facility], [address], [place], [place] [address]   . Osmolality 11/27/2020 276  275 - 295 mOsm/kg Final   Performed at [facility], [address] [place], [place] [address]  . Osmolality, Ur 11/27/2020 519  300 - 900 mOsm/kg Final   Performed at [facility], [address] [place], [place] [address]  . Color, Urine 11/27/2020 YELLOW  YELLOW Final  . APPearance 11/27/2020 CLEAR  CLEAR Final  . Specific Gravity, Urine 11/27/2020 1.020  1.005 - 1.030 Final  . pH 11/27/2020 6.0  5.0 - 8.0 Final  . Glucose, UA 11/27/2020 500* NEGATIVE mg/dL Final  . Hgb urine dipstick 11/27/2020 NEGATIVE  NEGATIVE Final  . Bilirubin Urine 11/27/2020 NEGATIVE  NEGATIVE Final  . Ketones, ur 11/27/2020 NEGATIVE  NEGATIVE mg/dL Final  . Protein, ur  11/27/2020 NEGATIVE  NEGATIVE mg/dL Final  . Nitrite 11/27/2020 NEGATIVE  NEGATIVE Final  . Chalmers Guest 11/27/2020 NEGATIVE  NEGATIVE Final  . Squamous Epithelial / LPF 11/27/2020 NONE SEEN  0 - 5 Final  . WBC, UA 11/27/2020 NONE SEEN  0 - 5 WBC/hpf Final  . RBC / HPF 11/27/2020 NONE SEEN  0 - 5 RBC/hpf Final  . Bacteria, UA 11/27/2020 NONE SEEN  NONE SEEN Final   Performed at [facility], [address], [place], [place] [address]  . Sodium, Ur 11/27/2020 114  mmol/L Final   Performed at [facility], [address] [place], [place] [address]  Infusion on 11/27/2020  Component Date Value Ref Range Status  . Tricyclic, Ur Screen 64/33/2951 NONE DETECTED  NONE DETECTED Final  . Amphetamines, Ur Screen 11/27/2020 NONE DETECTED  NONE DETECTED Final  . MDMA (Ecstasy)Ur Screen 11/27/2020 NONE DETECTED  NONE DETECTED Final  . Cocaine Metabolite,Ur [place] 11/27/2020 NONE DETECTED  NONE DETECTED Final  . Opiate, Ur Screen 11/27/2020 NONE DETECTED  NONE DETECTED Final  . Phencyclidine (PCP) Ur S 11/27/2020 NONE DETECTED  NONE DETECTED Final  . Cannabinoid 50 Ng,  Ur [place] 11/27/2020 NONE DETECTED  NONE DETECTED Final  . Barbiturates, Ur Screen 11/27/2020 NONE DETECTED  NONE DETECTED Final  . Benzodiazepine, Ur Scrn 11/27/2020 POSITIVE* NONE DETECTED Final  . Methadone Scn, Ur 11/27/2020 NONE DETECTED  NONE DETECTED Final   Comment: (NOTE) Tricyclics + metabolites, urine    Cutoff 1000 ng/mL Amphetamines + metabolites, urine  Cutoff 1000 ng/mL MDMA (Ecstasy), urine              Cutoff 500 ng/mL Cocaine Metabolite, urine          Cutoff 300 ng/mL Opiate + metabolites, urine        Cutoff 300 ng/mL Phencyclidine (PCP), urine         Cutoff 25 ng/mL Cannabinoid, urine                 Cutoff 50 ng/mL Barbiturates + metabolites, urine  Cutoff 200 ng/mL Benzodiazepine, urine              Cutoff 200 ng/mL Methadone, urine                   Cutoff 300 ng/mL  The urine drug screen  provides only a preliminary, unconfirmed analytical test result and should not be used for non-medical purposes. Clinical consideration and professional judgment should be applied to any positive drug screen result due to possible interfering substances. A more specific alternate chemical method must be used in order to obtain a confirmed analytical result. Gas chromatography / mass spectrometry (GC/MS) is the preferred confirm                          atory method. Performed at [facility], [address], [place], [place] [address]   . Ammonia 11/27/2020 19  9 - 35 umol/L Final   Performed at [facility], [address] [place], [place] [address]  . WBC 11/27/2020 12.9* 4.0 - 10.5 K/uL Final  . RBC 11/27/2020 5.08  4.22 - 5.81 MIL/uL Final  . Hemoglobin 11/27/2020 14.9  13.0 - 17.0 g/dL Final  . HCT 11/27/2020 42.8  39.0 - 52.0 % Final  . MCV 11/27/2020 84.3  80.0 - 100.0 fL Final  . MCH 11/27/2020 29.3  26.0 - 34.0 pg Final  . MCHC 11/27/2020 34.8  30.0 - 36.0 g/dL Final  . RDW 11/27/2020 12.1  11.5 - 15.5 % Final  . Platelets 11/27/2020 272  150 - 400 K/uL Final  . nRBC 11/27/2020 0.0  0.0 - 0.2 % Final  . Neutrophils Relative % 11/27/2020 85  % Final  . Neutro Abs 11/27/2020 10.8* 1.7 - 7.7 K/uL Final  . Lymphocytes Relative 11/27/2020 6  % Final  . Lymphs Abs 11/27/2020 0.8  0.7 - 4.0 K/uL Final  . Monocytes Relative 11/27/2020 8  % Final  . Monocytes Absolute 11/27/2020 1.1* 0.1 - 1.0 K/uL Final  . Eosinophils Relative 11/27/2020 0  % Final  . Eosinophils Absolute 11/27/2020 0.1  0.0 - 0.5 K/uL Final  . Basophils Relative 11/27/2020 0  % Final  . Basophils Absolute 11/27/2020 0.0  0.0 - 0.1 K/uL Final  . Immature Granulocytes 11/27/2020 1  % Final  . Abs Immature Granulocytes 11/27/2020 0.18* 0.00 - 0.07 K/uL Final   Performed at [facility], [address] [place], [place] [address]  . Sodium 11/27/2020 126* 135 - 145 mmol/L Final  .  Potassium 11/27/2020 3.9  3.5 - 5.1 mmol/L  Final  . Chloride 11/27/2020 87* 98 - 111 mmol/L Final  . CO2 11/27/2020 22  22 - 32 mmol/L Final  . Glucose, Bld 11/27/2020 220* 70 - 99 mg/dL Final   Glucose reference range applies only to samples taken after fasting for at least 8 hours.  . BUN 11/27/2020 15  8 - 23 mg/dL Final  . Creatinine, Ser 11/27/2020 0.84  0.61 - 1.24 mg/dL Final  . Calcium 11/27/2020 8.9  8.9 - 10.3 mg/dL Final  . GFR, Estimated 11/27/2020 >60  >60 mL/min Final   Comment: (NOTE) Calculated using the CKD-EPI Creatinine Equation (2021)   . Anion gap 11/27/2020 17* 5 - 15 Final   Performed at [facility], [address] [place], [place] [address]  . Magnesium 11/27/2020 2.1  1.7 - 2.4 mg/dL Final   Performed at [facility], [address], [place], [place] [address]  . Sodium 11/27/2020 125* 135 - 145 mmol/L Final  . Potassium 11/27/2020 3.9  3.5 - 5.1 mmol/L Final  . Chloride 11/27/2020 87* 98 - 111 mmol/L Final  . CO2 11/27/2020 20* 22 - 32 mmol/L Final  . Glucose, Bld 11/27/2020 217* 70 - 99 mg/dL Final   Glucose reference range applies only to samples taken after fasting for at least 8 hours.  . BUN 11/27/2020 15  8 - 23 mg/dL Final  . Creatinine, Ser 11/27/2020 0.87  0.61 - 1.24 mg/dL Final  . Calcium 11/27/2020 8.8* 8.9 - 10.3 mg/dL Final  . Total Protein 11/27/2020 7.2  6.5 - 8.1 g/dL Final  . Albumin 11/27/2020 3.9  3.5 - 5.0 g/dL Final  . AST 11/27/2020 15  15 - 41 U/L Final  . ALT 11/27/2020 17  0 - 44 U/L Final  . Alkaline Phosphatase 11/27/2020 49  38 - 126 U/L Final  . Total Bilirubin 11/27/2020 1.2  0.3 - 1.2 mg/dL Final  . GFR, Estimated 11/27/2020 >60  >60 mL/min Final   Comment: (NOTE) Calculated using the CKD-EPI Creatinine Equation (2021)   . Anion gap 11/27/2020 18* 5 - 15 Final   Performed at [facility], [address], [place], [place] [address]    Assessment:  Sean Garner. is a 75 y.o. 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  He presented with right neck adenopathy.    Neck soft tissue CT on 09/28/2020 revealed a 3.5 x 3.0 x 4.8 cm right neck mass most c/w enlarged lymph node. There was a 14 mm enhancing mass in the right base of tongue.   PET scan on 10/25/2020 revealed right tongue base primary with ipsilateral level 2 nodal metastasis. There were no findings of extracervical metastatic disease. There was focal hypermetabolism in the distal esophagus, with possible concurrent soft tissue density lesion. Recommended correlation with endoscopy. There was vague right lower lobe low-level hypermetabolism and ground-glass nodularity, favored minimal infection or aspiration. Incidental findings included aortic atherosclerosis, coronary artery atherosclerosis, emphysema, a tiny hiatal hernia, and prostatomegaly.  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  Speech discrimination scores were 100% in each ear.  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  Upper endoscopy on 11/03/2020 revealed a  malignant esophageal tumor at the gastroesophageal junction. There were esophageal mucosal changes c/w short-segment Barrett's esophagus. There was a 2 cm hiatal hernia. There was gastritis.  Pathology in the esophagus at 36 cm revealed intramucosal adenocarcinoma arising in a background of high grade dysplasia and intestinal metaplasia; pathology at 38 cm revealed adenocarcinoma.  EUS on 11/15/2020 revealed early stage adenocarcinoma arising from Barrett's esophagus.  Lesion was T1bN0 and non-obstructing.  He has PTSD and clastrophobia.  Anxiety is relieved by alprazolam.   He is also on citalopram.  Symptomatically, he feels better.  He is sleeping well with trazodone.  Anxiety prior to radiation is relieved with Xanax.  Exam is stable.  Plan: 1.   Labs today: CBC with diff, BMP, cortisol. 2. Clinical stage I right base of tongue carcinoma Clinical stage I. 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 PET scan on 10/25/2020 revealed a right base of tongue primary with ipsilateral level 2 nodal metastasis. No evidence of distant metastasis. Plan: cisplatin 40 mg/m2 weekly x 7 with radiation.             He began concurrent radiation and cisplatin on 11/20/2020.  Labs reviewed.  Week #2 cisplatin today.   He has had no nausea associated with chemotherapy.  Discuss symptom management.  He has antiemetics at home to use on a prn bases.  Interventions are adequate.    3. 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 EUS on 11/15/2020 revealed a T1bN0 lesion. He may be a candidate for endoscopic resection followed by ablation or esphagectomy. Patient will be referred to surgery at [facility] for evaluation. 4. Anxiety Patient's anxiety appears well managed.  He remains on citalopram 20 mg a day and Xanax 0.25 mg prior to daily radiation.  He does not need additional Xanax.  He is sleeping well with trazodone. 5.   Altered mental status             Patient back to baseline. 6.   Hyponatremia             Sodium 128.  Cortisol is normal.             Urine sodium was 114 (elevated).             Patient off HCTZ.  Encourage fluids with electrolytes and added salt.    Continue to monitor 7.   Week #2 cisplatin today. 8.   Labs in [place] on Thursday  (BMP). 9.   RTC in 1 week for MD assessment, labs (CBC with diff, CMP, Mg) and week #3 cisplatin.  I discussed the assessment and treatment plan with the patient.  The patient was provided an opportunity to ask questions and all were answered.  The patient agreed with the plan and demonstrated an understanding of the instructions.  The patient was advised to call back if the symptoms worsen or if the condition fails to improve as anticipated.   Cruise Baumgardner C. Mike Gip, MD, PhD    11/28/2020, 9:10 AM  I, Mirian Mo Tufford, am acting as Education administrator for [employer]. Mike Gip, MD, PhD.  I, Lindie Roberson C. Mike Gip, MD, have reviewed the above documentation for accuracy and completeness, and I agree with the above.

## 2020-11-27 NOTE — Progress Notes (Signed)
Patient here for oncology follow-up appointment, expresses concerns of "not feeling like himself", wife states he has been "loopy all weekend". Patient A  X O x 3, unable to tell date but alert to self and location. No pain or other issues.

## 2020-11-27 NOTE — Telephone Encounter (Signed)
I talked to pcp and routed recent lab work to them to [place] would like to for pcp to stop HCTZ. Low sodium cause mentation to be altered.

## 2020-11-28 ENCOUNTER — Inpatient Hospital Stay: Payer: Medicare Other

## 2020-11-28 ENCOUNTER — Encounter: Payer: Self-pay | Admitting: Hematology and Oncology

## 2020-11-28 ENCOUNTER — Ambulatory Visit
Admission: RE | Admit: 2020-11-28 | Discharge: 2020-11-28 | Disposition: A | Discharge status: Home / Self Care | Payer: Medicare Other | Source: Ambulatory Visit | Attending: Radiation Oncology | Admitting: Radiation Oncology

## 2020-11-28 ENCOUNTER — Inpatient Hospital Stay (HOSPITAL_BASED_OUTPATIENT_CLINIC_OR_DEPARTMENT_OTHER): Payer: Medicare Other | Admitting: Hematology and Oncology

## 2020-11-28 ENCOUNTER — Other Ambulatory Visit: Payer: Self-pay | Admitting: Hematology and Oncology

## 2020-11-28 ENCOUNTER — Inpatient Hospital Stay: Payer: Medicare Other | Admitting: Hospice and Palliative Medicine

## 2020-11-28 VITALS — BP 156/73 | HR 64 | Temp 96.2°F | Resp 16 | Wt 208.8 lb

## 2020-11-28 DIAGNOSIS — C01 Malignant neoplasm of base of tongue: Secondary | ICD-10-CM | POA: Diagnosis not present

## 2020-11-28 DIAGNOSIS — Z5111 Encounter for antineoplastic chemotherapy: Secondary | ICD-10-CM

## 2020-11-28 DIAGNOSIS — E871 Hypo-osmolality and hyponatremia: Secondary | ICD-10-CM

## 2020-11-28 DIAGNOSIS — C159 Malignant neoplasm of esophagus, unspecified: Secondary | ICD-10-CM

## 2020-11-28 DIAGNOSIS — F4312 Post-traumatic stress disorder, chronic: Secondary | ICD-10-CM | POA: Diagnosis not present

## 2020-11-28 DIAGNOSIS — F419 Anxiety disorder, unspecified: Secondary | ICD-10-CM | POA: Diagnosis not present

## 2020-11-28 DIAGNOSIS — Z510 Encounter for antineoplastic radiation therapy: Secondary | ICD-10-CM | POA: Diagnosis not present

## 2020-11-28 LAB — BASIC METABOLIC PANEL
Anion gap: 12 (ref 5–15)
BUN: 15 mg/dL (ref 8–23)
CO2: 25 mmol/L (ref 22–32)
Calcium: 8.8 mg/dL — ABNORMAL LOW (ref 8.9–10.3)
Chloride: 91 mmol/L — ABNORMAL LOW (ref 98–111)
Creatinine, Ser: 0.85 mg/dL (ref 0.61–1.24)
GFR, Estimated: >60 mL/min (ref >60)
Glucose, Bld: 251 mg/dL — ABNORMAL HIGH (ref 70–99)
Potassium: 3.9 mmol/L (ref 3.5–5.1)
Sodium: 128 mmol/L — ABNORMAL LOW (ref 135–145)

## 2020-11-28 LAB — CORTISOL: Cortisol, Plasma: 18.7 ug/dL

## 2020-11-28 MED ORDER — MAGNESIUM SULFATE 2 GM/50ML IV SOLN
2.0000 g | Freq: Once | INTRAVENOUS | Status: AC
  Administered 2020-11-28: 2 g via INTRAVENOUS
  Filled 2020-11-28: qty 50

## 2020-11-28 MED ORDER — POTASSIUM CHLORIDE IN NACL 20-0.9 MEQ/L-% IV SOLN
Freq: Once | INTRAVENOUS | Status: AC
  Filled 2020-11-28: qty 1000

## 2020-11-28 MED ORDER — SODIUM CHLORIDE 0.9 % IV SOLN
150.0000 mg | Freq: Once | INTRAVENOUS | Status: AC
  Administered 2020-11-28: 150 mg via INTRAVENOUS
  Filled 2020-11-28: qty 5

## 2020-11-28 MED ORDER — SODIUM CHLORIDE 0.9 % IV SOLN: Freq: Once | INTRAVENOUS | Status: DC

## 2020-11-28 MED ORDER — SODIUM CHLORIDE 0.9 % IV SOLN
10.0000 mg | Freq: Once | INTRAVENOUS | Status: AC
  Administered 2020-11-28: 10 mg via INTRAVENOUS
  Filled 2020-11-28: qty 1

## 2020-11-28 MED ORDER — SODIUM CHLORIDE 0.9 % IV SOLN
Freq: Once | INTRAVENOUS | Status: AC
  Filled 2020-11-28: qty 250

## 2020-11-28 MED ORDER — PALONOSETRON HCL INJECTION 0.25 MG/5ML
0.2500 mg | Freq: Once | INTRAVENOUS | Status: AC
  Administered 2020-11-28: 0.25 mg via INTRAVENOUS
  Filled 2020-11-28: qty 5

## 2020-11-28 MED ORDER — SODIUM CHLORIDE 0.9 % IV SOLN
40.0000 mg/m2 | Freq: Once | INTRAVENOUS | Status: AC
  Administered 2020-11-28: 87 mg via INTRAVENOUS
  Filled 2020-11-28: qty 87

## 2020-11-28 MED ORDER — HEPARIN SOD (PORK) LOCK FLUSH 100 UNIT/ML IV SOLN
500.0000 [IU] | Freq: Once | INTRAVENOUS | Status: AC | PRN
  Administered 2020-11-28: 500 [IU]
  Filled 2020-11-28: qty 5

## 2020-11-28 NOTE — Progress Notes (Signed)
Patient received prescribed treatment in clinic. Cisplatin with pre and post hydration. Tolerated well. Patient stable at discharge.

## 2020-11-29 ENCOUNTER — Ambulatory Visit
Admission: RE | Admit: 2020-11-29 | Discharge: 2020-11-29 | Disposition: A | Discharge status: Home / Self Care | Payer: Medicare Other | Source: Ambulatory Visit | Attending: Radiation Oncology | Admitting: Radiation Oncology

## 2020-11-29 DIAGNOSIS — Z510 Encounter for antineoplastic radiation therapy: Secondary | ICD-10-CM | POA: Diagnosis not present

## 2020-11-30 ENCOUNTER — Telehealth: Payer: Self-pay

## 2020-11-30 ENCOUNTER — Other Ambulatory Visit: Payer: Self-pay

## 2020-11-30 ENCOUNTER — Inpatient Hospital Stay: Payer: Medicare Other

## 2020-11-30 ENCOUNTER — Other Ambulatory Visit (HOSPITAL_COMMUNITY): Payer: Medicare Other

## 2020-11-30 ENCOUNTER — Ambulatory Visit
Admission: RE | Admit: 2020-11-30 | Discharge: 2020-11-30 | Disposition: A | Discharge status: Home / Self Care | Payer: Medicare Other | Source: Ambulatory Visit | Attending: Radiation Oncology | Admitting: Radiation Oncology

## 2020-11-30 DIAGNOSIS — C01 Malignant neoplasm of base of tongue: Secondary | ICD-10-CM

## 2020-11-30 DIAGNOSIS — Z510 Encounter for antineoplastic radiation therapy: Secondary | ICD-10-CM | POA: Diagnosis not present

## 2020-11-30 DIAGNOSIS — Z5111 Encounter for antineoplastic chemotherapy: Secondary | ICD-10-CM | POA: Diagnosis not present

## 2020-11-30 LAB — BASIC METABOLIC PANEL
Anion gap: 3 — ABNORMAL LOW (ref 5–15)
BUN: 16 mg/dL (ref 8–23)
CO2: 31 mmol/L (ref 22–32)
Calcium: 8.3 mg/dL — ABNORMAL LOW (ref 8.9–10.3)
Chloride: 98 mmol/L (ref 98–111)
Creatinine, Ser: 0.89 mg/dL (ref 0.61–1.24)
GFR, Estimated: >60 mL/min (ref >60)
Glucose, Bld: 203 mg/dL — ABNORMAL HIGH (ref 70–99)
Potassium: 4.1 mmol/L (ref 3.5–5.1)
Sodium: 132 mmol/L — ABNORMAL LOW (ref 135–145)

## 2020-12-01 ENCOUNTER — Ambulatory Visit
Admission: RE | Admit: 2020-12-01 | Discharge: 2020-12-01 | Disposition: A | Discharge status: Home / Self Care | Payer: Medicare Other | Source: Ambulatory Visit | Attending: Radiation Oncology | Admitting: Radiation Oncology

## 2020-12-01 ENCOUNTER — Inpatient Hospital Stay: Payer: Medicare Other

## 2020-12-01 ENCOUNTER — Telehealth: Payer: Self-pay | Admitting: *Deleted

## 2020-12-01 DIAGNOSIS — Z510 Encounter for antineoplastic radiation therapy: Secondary | ICD-10-CM | POA: Diagnosis not present

## 2020-12-01 NOTE — Telephone Encounter (Signed)
Wife called reporting that she forgot to give the dexamethasone after chemotherapy wed, thurs and today. I told her it was too late to give it now, but to make sure she has it out for the next round of treatment 12/05/20. She states that he has not been sick at all.

## 2020-12-04 ENCOUNTER — Ambulatory Visit: Payer: Medicare Other

## 2020-12-04 ENCOUNTER — Ambulatory Visit
Admission: RE | Admit: 2020-12-04 | Discharge: 2020-12-04 | Disposition: A | Discharge status: Home / Self Care | Payer: Medicare Other | Source: Ambulatory Visit | Attending: Radiation Oncology | Admitting: Radiation Oncology

## 2020-12-04 DIAGNOSIS — Z510 Encounter for antineoplastic radiation therapy: Secondary | ICD-10-CM | POA: Diagnosis not present

## 2020-12-05 ENCOUNTER — Inpatient Hospital Stay: Payer: Medicare Other | Admitting: Hematology and Oncology

## 2020-12-05 ENCOUNTER — Ambulatory Visit: Payer: Medicare Other

## 2020-12-05 ENCOUNTER — Inpatient Hospital Stay: Payer: Medicare Other

## 2020-12-05 ENCOUNTER — Ambulatory Visit
Admission: RE | Admit: 2020-12-05 | Discharge: 2020-12-05 | Disposition: A | Discharge status: Home / Self Care | Payer: Medicare Other | Source: Ambulatory Visit | Attending: Radiation Oncology | Admitting: Radiation Oncology

## 2020-12-05 DIAGNOSIS — Z510 Encounter for antineoplastic radiation therapy: Secondary | ICD-10-CM | POA: Diagnosis not present

## 2020-12-05 NOTE — Progress Notes (Signed)
[facility]  [address], [address] [place], [place] [address] Phone: [phone]  Fax: [phone]   Clinic Day:  12/06/2020  Referring physician: Derinda Late, MD  Chief Complaint: Sean Garner. is a 75 y.o. 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  HPI:  The patient was last seen in the medical oncology clinic on 11/28/2020. At that time, he was feeling better.  He was back to his baseline.  Anxiet was well managed with Xanax prior to radiation.  He was sleeping well on trazodone.  Sodium was 128 and glucose 251. Cortisol was 18.7. He received week #2 cisplatin. He received 2 gm IV magnesium.   Sodium was 132 and calcium 8.3 on 11/30/2020.  He was contacted on 11/30/2020 and told to start calcium 600 mg BID and vitamin D 800 units daily.  He is currently receiving IMRT treatment to the tongue. He has received 13 treatments thus far. The plan is for 70 Gy in 35 fractions. He has not missed any radiation treatments due to the weather.  During the interim, he has been feeling "pretty good." He denies mouth tenderness. Sometimes if he takes a bite of food that it too big, it feels like a "lump going down." He has been taking smaller bites. He has been drinking more water.  He is very anxious in the mornings before radiation and after 5:00 pm. The anxiety comes in waves. For the past 2 days, he has not had anxiety in the evenings. The patient is trying to set up an appointment with a psychiatrist through the [place]. He is currently taking citalopram 20 mg daily and Xanax 0.25 mg before radiation. He takes Trazodone before bed, which helps him sleep.  He takes calcium and vitamin D. He forgot to take Decadron after his last week of chemotherapy. He has not been nauseous.   Past Medical History:  Diagnosis Date   . Anxiety   . Cancer [facility])    Head/throat Cancer at the base of the tongue  . Cataract    lt eye repair; present in rt eye but not ripe yet.  . Claustrophobia   . Diabetes mellitus without complication ([place])   . GERD (gastroesophageal reflux disease)   . Glaucoma    using gtts for this.  . Hypercholesterolemia   . Hypertension     Past Surgical History:  Procedure Laterality Date  . CATARACT EXTRACTION W/PHACO Left 12/03/2017   Procedure: CATARACT EXTRACTION PHACO AND INTRAOCULAR LENS PLACEMENT ([facility]) COMPLICATED DIABETIC LEFT;  Surgeon: Leandrew Koyanagi, MD;  Location: [facility];  Service: Ophthalmology;  Laterality: Left;  Diabetic - oral meds  . CHOLECYSTECTOMY    . COLONOSCOPY  2016  . PORTA CATH INSERTION N/A 11/13/2020   Procedure: PORTA CATH INSERTION;  Surgeon: Algernon Huxley, MD;  Location: [place] [facility];  Service: Cardiovascular;  Laterality: N/A;    Family History  Problem Relation Age of Onset  . Cancer Mother   . Colon cancer Neg Hx   . Colon polyps Neg Hx   . Esophageal cancer Neg Hx   . Rectal cancer Neg Hx   . Stomach cancer Neg Hx     Social History:  reports that he quit smoking about 36 years ago. He has never used smokeless tobacco. He reports previous alcohol use. He reports  that he does not use drugs. He quit smoking cold [place] in 1986. He smoked 2 packs per day for 20 years. He drinks a glass of wine or a Margarita occasionally. 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 He was exposed to [employer]. He worked with a Geologist, engineering for 34 years. The patient is accompanied by his wife in person today.  Allergies: No Known Allergies  Current Medications: Current Outpatient Medications  Medication Sig Dispense Refill  . ALPRAZolam (XANAX) 0.25 MG tablet Take 1 tablet (0.25 mg total) by mouth at bedtime as needed for anxiety. (Patient taking differently: Take 0.25 mg by mouth as needed for anxiety.) 60 tablet 0  .  brimonidine-timolol (COMBIGAN) 0.2-0.5 % ophthalmic solution Place 1 drop into the left eye daily.    . citalopram (CELEXA) 20 MG tablet Take 20 mg by mouth daily.    Marland Kitchen esomeprazole (NEXIUM) 20 MG capsule Take 40 mg by mouth daily at 12 noon.    Marland Kitchen glipiZIDE (GLUCOTROL XL) 5 MG 24 hr tablet Take 5 mg by mouth 2 (two) times daily.  1  . glucose blood test strip CHECK FASTING BLOOD SUGAR EVERY MORNING. (NEED SIGNATURE)    . lidocaine-prilocaine (EMLA) cream Apply to affected area once 30 g 3  . metFORMIN (GLUCOPHAGE-XR) 500 MG 24 hr tablet Take 500 mg by mouth 2 (two) times daily. Takes 2 tablets and 1 tablet in the pm    . simvastatin (ZOCOR) 40 MG tablet Take by mouth.    . traZODone (DESYREL) 50 MG tablet Take 0.5-1 tablets (25-50 mg total) by mouth at bedtime as needed for sleep. 30 tablet 2  . VITAMIN D PO Take 800 Units by mouth daily.    Marland Kitchen ALPRAZolam (XANAX) 0.5 MG tablet Take 1 tablet (0.5 mg total) by mouth 3 (three) times daily as needed for anxiety. (Patient not taking: Reported on 12/06/2020) 45 tablet 0  . dexamethasone (DECADRON) 4 MG tablet Take 2 tablets (8 mg total) by mouth daily. Take daily x 3 days starting the day after cisplatin chemotherapy. Take with food. (Patient not taking: Reported on 12/06/2020) 30 tablet 1  . ondansetron (ZOFRAN) 8 MG tablet Take 1 tablet (8 mg total) by mouth 2 (two) times daily as needed. Start on the third day after cisplatin chemotherapy. (Patient not taking: No sig reported) 30 tablet 1  . prochlorperazine (COMPAZINE) 10 MG tablet Take 1 tablet (10 mg total) by mouth every 6 (six) hours as needed (Nausea or vomiting). (Patient not taking: No sig reported) 30 tablet 1   Current Facility-Administered Medications  Medication Dose Route Frequency Provider Last Rate Last Admin  . 0.9 %  sodium chloride infusion  500 mL Intravenous Once Pyrtle, Lajuan Lines, MD       Facility-Administered Medications Ordered in Other Visits  Medication Dose Route Frequency  Provider Last Rate Last Admin  . 0.9 % NaCl with KCl 20 mEq/ L  infusion   Intravenous Once Malayshia All C, MD      . CISplatin (PLATINOL) 87 mg in sodium chloride 0.9 % 250 mL chemo infusion  40 mg/m2 (Order-Specific) Intravenous Once Nolon Stalls C, MD      . dexamethasone (DECADRON) 10 mg in sodium chloride 0.9 % 50 mL IVPB  10 mg Intravenous Once Bunnie Rehberg C, MD      . fosaprepitant (EMEND) 150 mg in sodium chloride 0.9 % 145 mL IVPB  150 mg Intravenous Once Lequita Asal, MD      .  magnesium sulfate IVPB 2 g 50 mL  2 g Intravenous Once Lequita Asal, MD        Review of Systems  Constitutional: Negative for chills, diaphoresis, fever, malaise/fatigue and weight loss (5 lbs).       Feels "pretty good."  HENT: Positive for hearing loss. Negative for congestion, ear discharge, ear pain, nosebleeds, sinus pain, sore throat and tinnitus.        No mouth tenderness.  Eyes: Negative for blurred vision.  Respiratory: Negative for cough, hemoptysis, sputum production and shortness of breath.   Cardiovascular: Negative for chest pain, palpitations and leg swelling.  Gastrointestinal: Negative for abdominal pain, blood in stool, constipation, diarrhea, heartburn (takes nexium), melena, nausea and vomiting.       Good appetite.  Genitourinary: Negative for dysuria, frequency, hematuria and urgency.  Musculoskeletal: Negative for back pain, joint pain, myalgias and neck pain.  Skin: Negative for itching and rash.  Neurological: Negative for dizziness, tingling, sensory change, weakness and headaches.  Endo/Heme/Allergies: Does not bruise/bleed easily.  Psychiatric/Behavioral: Negative for depression and memory loss. The patient is nervous/anxious (on citalopram and xanax). The patient does not have insomnia.   All other systems reviewed and are negative.  Performance status (ECOG): 1  Vitals Blood pressure (!) 145/66, pulse 67, temperature (!) 96 F (35.6 C),  temperature source Tympanic, resp. rate 16, weight 203 lb 2.5 oz (92.1 kg), SpO2 97 %.   Physical Exam Vitals and nursing note reviewed.  Constitutional:      General: He is not in acute distress.    Appearance: He is not diaphoretic.  HENT:     Head: Normocephalic and atraumatic.     Comments: Gray hair.    Mouth/Throat:     Mouth: Mucous membranes are moist.     Pharynx: Oropharynx is clear.  Eyes:     General: No scleral icterus.    Extraocular Movements: Extraocular movements intact.     Conjunctiva/sclera: Conjunctivae normal.     Pupils: Pupils are equal, round, and reactive to light.     Comments: Blue eyes.  Neck:     Comments: 2.5 x 3 cm right neck mass (previously 3.5 x 3 cm) Cardiovascular:     Rate and Rhythm: Normal rate and regular rhythm.     Heart sounds: Normal heart sounds. No murmur heard.   Pulmonary:     Effort: Pulmonary effort is normal. No respiratory distress.     Breath sounds: Normal breath sounds. No wheezing or rales.  Chest:     Chest wall: No tenderness.  Breasts:     Right: No axillary adenopathy or supraclavicular adenopathy.     Left: No axillary adenopathy or supraclavicular adenopathy.    Abdominal:     General: Bowel sounds are normal. There is no distension.     Palpations: Abdomen is soft. There is no mass.     Tenderness: There is no abdominal tenderness. There is no guarding or rebound.  Musculoskeletal:        General: No swelling or tenderness. Normal range of motion.     Cervical back: Normal range of motion and neck supple.  Lymphadenopathy:     Head:     Right side of head: No preauricular, posterior auricular or occipital adenopathy.     Left side of head: No preauricular, posterior auricular or occipital adenopathy.     Upper Body:     Right upper body: No supraclavicular or axillary adenopathy.  Left upper body: No supraclavicular or axillary adenopathy.     Lower Body: No right inguinal adenopathy. No left inguinal  adenopathy.  Skin:    General: Skin is warm and dry.     Comments: Port-a-cath unremarkable.  Neurological:     Mental Status: He is alert and oriented to person, place, and time.  Psychiatric:        Behavior: Behavior normal.        Thought Content: Thought content normal.        Judgment: Judgment normal.    Infusion on 12/06/2020  Component Date Value Ref Range Status  . WBC 12/06/2020 9.3  4.0 - 10.5 K/uL Final  . RBC 12/06/2020 4.53  4.22 - 5.81 MIL/uL Final  . Hemoglobin 12/06/2020 13.3  13.0 - 17.0 g/dL Final  . HCT 12/06/2020 39.5  39.0 - 52.0 % Final  . MCV 12/06/2020 87.2  80.0 - 100.0 fL Final  . MCH 12/06/2020 29.4  26.0 - 34.0 pg Final  . MCHC 12/06/2020 33.7  30.0 - 36.0 g/dL Final  . RDW 12/06/2020 12.5  11.5 - 15.5 % Final  . Platelets 12/06/2020 239  150 - 400 K/uL Final  . nRBC 12/06/2020 0.0  0.0 - 0.2 % Final  . Neutrophils Relative % 12/06/2020 85  % Final  . Neutro Abs 12/06/2020 7.9* 1.7 - 7.7 K/uL Final  . Lymphocytes Relative 12/06/2020 6  % Final  . Lymphs Abs 12/06/2020 0.6* 0.7 - 4.0 K/uL Final  . Monocytes Relative 12/06/2020 8  % Final  . Monocytes Absolute 12/06/2020 0.8  0.1 - 1.0 K/uL Final  . Eosinophils Relative 12/06/2020 0  % Final  . Eosinophils Absolute 12/06/2020 0.0  0.0 - 0.5 K/uL Final  . Basophils Relative 12/06/2020 0  % Final  . Basophils Absolute 12/06/2020 0.0  0.0 - 0.1 K/uL Final  . Immature Granulocytes 12/06/2020 1  % Final  . Abs Immature Granulocytes 12/06/2020 0.09* 0.00 - 0.07 K/uL Final   Performed at [facility], [address], [place], [place] [address]  . Sodium 12/06/2020 130* 135 - 145 mmol/L Final  . Potassium 12/06/2020 4.1  3.5 - 5.1 mmol/L Final  . Chloride 12/06/2020 96* 98 - 111 mmol/L Final  . CO2 12/06/2020 26  22 - 32 mmol/L Final  . Glucose, Bld 12/06/2020 283* 70 - 99 mg/dL Final   Glucose reference range applies only to samples taken after fasting for at least 8 hours.  . BUN  12/06/2020 15  8 - 23 mg/dL Final  . Creatinine, Ser 12/06/2020 0.94  0.61 - 1.24 mg/dL Final  . Calcium 12/06/2020 9.0  8.9 - 10.3 mg/dL Final  . Total Protein 12/06/2020 7.0  6.5 - 8.1 g/dL Final  . Albumin 12/06/2020 3.7  3.5 - 5.0 g/dL Final  . AST 12/06/2020 19  15 - 41 U/L Final  . ALT 12/06/2020 29  0 - 44 U/L Final  . Alkaline Phosphatase 12/06/2020 63  38 - 126 U/L Final  . Total Bilirubin 12/06/2020 0.6  0.3 - 1.2 mg/dL Final  . GFR, Estimated 12/06/2020 >60  >60 mL/min Final   Comment: (NOTE) Calculated using the CKD-EPI Creatinine Equation (2021)   . Anion gap 12/06/2020 8  5 - 15 Final   Performed at [facility], [address], [place], [place] [address]  . Magnesium 12/06/2020 1.7  1.7 - 2.4 mg/dL Final   Performed at [facility], [place]  [address] [place], [place] [address]    Assessment:  Sean Fladger. is a 74 y.o. 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  He presented with right neck adenopathy.    Neck soft tissue CT on 09/28/2020 revealed a 3.5 x 3.0 x 4.8 cm right neck mass most c/w enlarged lymph node. There was a 14 mm enhancing mass in the right base of tongue.   PET scan on 10/25/2020 revealed right tongue base primary with ipsilateral level 2 nodal metastasis. There were no findings of extracervical metastatic disease. There was focal hypermetabolism in the distal esophagus, with possible concurrent soft tissue density lesion. Recommended correlation with endoscopy. There was vague right lower lobe low-level hypermetabolism and ground-glass nodularity, favored minimal infection or aspiration. Incidental findings included aortic atherosclerosis, coronary artery atherosclerosis, emphysema, a tiny hiatal hernia, and  prostatomegaly.  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  Speech discrimination scores were 100% in each ear.  He is s/p week #2 cisplatin and concurrent radiation (11/20/2020 - 11/28/2020).  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  Upper endoscopy on 11/03/2020 revealed a malignant esophageal tumor at the gastroesophageal junction. There were esophageal mucosal changes c/w short-segment Barrett's esophagus. There was a 2 cm hiatal hernia. There was gastritis.  Pathology in the esophagus at 36 cm revealed intramucosal adenocarcinoma arising in a background of high grade dysplasia and intestinal metaplasia; pathology at 38 cm revealed adenocarcinoma.  EUS on 11/15/2020 revealed early stage adenocarcinoma arising from Barrett's esophagus.  Lesion was T1bN0 and non-obstructing.  He has PTSD and clastrophobia.  Anxiety is relieved by alprazolam.  He is also on citalopram.  Symptomatically, he feels "pretty good." He denies mouth tenderness. Anxiety comes in waves.  Exam reveals decreasing right neck adenopathy.  Plan: 1.   Labs today: CBC with diff, CMP, Mg. 2. Clinical stage I right base of tongue carcinoma Clinical stage I. 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 PET scan on 10/25/2020 revealed a right base of tongue primary with ipsilateral level 2 nodal metastasis. No evidence of distant metastasis. Plan: cisplatin 40 mg/m2 weekly x 7 with radiation.             He began concurrent radiation and cisplatin on 11/20/2020.  Labs reviewed.  Week #3 cisplatin today.   He denies any symptoms associated with treatment.  Discuss symptom management.  He has antiemetics at home to use on a prn bases.  Interventions are adequate.    3. 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 EUS on 11/15/2020 revealed a T1bN0 lesion. He may be a candidate for endoscopic resection followed by ablation or esphagectomy. Anticipate surgical consultation at [facility] in the near future. 4. Anxiety Anxiety appears well managed with Xanax prior to radiation.  Continue to monitor. 5.   Hyponatremia             Sodium 130.             He is off hydrochlorothiazide.  Encourage fluids with electrolytes. 6.   Hypocalcemia  Calcium was 8.3 on 11/30/2020 and 9.0 today.  Continue oral calcium. 7.   Week #3  cisplatin today. 8.   RTC in 1 week for MD assessment, labs (CBC with diff, CMP, Mg), and week #4 cisplatin.  I discussed the assessment and treatment plan with the patient.  The patient was provided an opportunity to ask questions and all were answered.  The patient agreed with the plan and demonstrated an understanding of the instructions.  The patient was advised to call back if the symptoms worsen or if the condition fails to improve as anticipated.   Marqual Mi C. Mike Gip, MD, PhD    12/06/2020, 9:42 AM   I, Mirian Mo Tufford, am acting as Education administrator for [employer]. Mike Gip, MD, PhD.  I, Chung Chagoya C. Mike Gip, MD, have reviewed the above documentation for accuracy and completeness, and I agree with the above.

## 2020-12-06 ENCOUNTER — Inpatient Hospital Stay (HOSPITAL_BASED_OUTPATIENT_CLINIC_OR_DEPARTMENT_OTHER): Payer: Medicare Other | Admitting: Hematology and Oncology

## 2020-12-06 ENCOUNTER — Inpatient Hospital Stay: Payer: Medicare Other

## 2020-12-06 ENCOUNTER — Ambulatory Visit
Admission: RE | Admit: 2020-12-06 | Discharge: 2020-12-06 | Disposition: A | Discharge status: Home / Self Care | Payer: Medicare Other | Source: Ambulatory Visit | Attending: Radiation Oncology | Admitting: Radiation Oncology

## 2020-12-06 ENCOUNTER — Other Ambulatory Visit: Payer: Self-pay

## 2020-12-06 ENCOUNTER — Encounter: Payer: Self-pay | Admitting: Hematology and Oncology

## 2020-12-06 VITALS — BP 145/66 | HR 67 | Temp 96.0°F | Resp 16 | Wt 203.2 lb

## 2020-12-06 DIAGNOSIS — C159 Malignant neoplasm of esophagus, unspecified: Secondary | ICD-10-CM

## 2020-12-06 DIAGNOSIS — F419 Anxiety disorder, unspecified: Secondary | ICD-10-CM

## 2020-12-06 DIAGNOSIS — C01 Malignant neoplasm of base of tongue: Secondary | ICD-10-CM

## 2020-12-06 DIAGNOSIS — E871 Hypo-osmolality and hyponatremia: Secondary | ICD-10-CM

## 2020-12-06 DIAGNOSIS — Z5111 Encounter for antineoplastic chemotherapy: Secondary | ICD-10-CM

## 2020-12-06 DIAGNOSIS — Z510 Encounter for antineoplastic radiation therapy: Secondary | ICD-10-CM | POA: Diagnosis not present

## 2020-12-06 DIAGNOSIS — F4312 Post-traumatic stress disorder, chronic: Secondary | ICD-10-CM

## 2020-12-06 LAB — CBC WITH DIFFERENTIAL/PLATELET
Abs Immature Granulocytes: 0.09 10*3/uL — ABNORMAL HIGH (ref 0.00–0.07)
Basophils Absolute: 0 10*3/uL (ref 0.0–0.1)
Basophils Relative: 0 %
Eosinophils Absolute: 0 10*3/uL (ref 0.0–0.5)
Eosinophils Relative: 0 %
HCT: 39.5 % (ref 39.0–52.0)
Hemoglobin: 13.3 g/dL (ref 13.0–17.0)
Immature Granulocytes: 1 %
Lymphocytes Relative: 6 %
Lymphs Abs: 0.6 10*3/uL — ABNORMAL LOW (ref 0.7–4.0)
MCH: 29.4 pg (ref 26.0–34.0)
MCHC: 33.7 g/dL (ref 30.0–36.0)
MCV: 87.2 fL (ref 80.0–100.0)
Monocytes Absolute: 0.8 10*3/uL (ref 0.1–1.0)
Monocytes Relative: 8 %
Neutro Abs: 7.9 10*3/uL — ABNORMAL HIGH (ref 1.7–7.7)
Neutrophils Relative %: 85 %
Platelets: 239 10*3/uL (ref 150–400)
RBC: 4.53 MIL/uL (ref 4.22–5.81)
RDW: 12.5 % (ref 11.5–15.5)
WBC: 9.3 10*3/uL (ref 4.0–10.5)
nRBC: 0 % (ref 0.0–0.2)

## 2020-12-06 LAB — COMPREHENSIVE METABOLIC PANEL
ALT: 29 U/L (ref 0–44)
AST: 19 U/L (ref 15–41)
Albumin: 3.7 g/dL (ref 3.5–5.0)
Alkaline Phosphatase: 63 U/L (ref 38–126)
Anion gap: 8 (ref 5–15)
BUN: 15 mg/dL (ref 8–23)
CO2: 26 mmol/L (ref 22–32)
Calcium: 9 mg/dL (ref 8.9–10.3)
Chloride: 96 mmol/L — ABNORMAL LOW (ref 98–111)
Creatinine, Ser: 0.94 mg/dL (ref 0.61–1.24)
GFR, Estimated: >60 mL/min (ref >60)
Glucose, Bld: 283 mg/dL — ABNORMAL HIGH (ref 70–99)
Potassium: 4.1 mmol/L (ref 3.5–5.1)
Sodium: 130 mmol/L — ABNORMAL LOW (ref 135–145)
Total Bilirubin: 0.6 mg/dL (ref 0.3–1.2)
Total Protein: 7 g/dL (ref 6.5–8.1)

## 2020-12-06 LAB — MAGNESIUM: Magnesium: 1.7 mg/dL (ref 1.7–2.4)

## 2020-12-06 MED ORDER — POTASSIUM CHLORIDE IN NACL 20-0.9 MEQ/L-% IV SOLN
Freq: Once | INTRAVENOUS | Status: AC
  Filled 2020-12-06: qty 1000

## 2020-12-06 MED ORDER — SODIUM CHLORIDE 0.9 % IV SOLN
150.0000 mg | Freq: Once | INTRAVENOUS | Status: AC
  Administered 2020-12-06: 150 mg via INTRAVENOUS
  Filled 2020-12-06: qty 5

## 2020-12-06 MED ORDER — SODIUM CHLORIDE 0.9 % IV SOLN
40.0000 mg/m2 | Freq: Once | INTRAVENOUS | Status: AC
  Administered 2020-12-06: 87 mg via INTRAVENOUS
  Filled 2020-12-06: qty 87

## 2020-12-06 MED ORDER — PALONOSETRON HCL INJECTION 0.25 MG/5ML
0.2500 mg | Freq: Once | INTRAVENOUS | Status: AC
  Administered 2020-12-06: 0.25 mg via INTRAVENOUS
  Filled 2020-12-06: qty 5

## 2020-12-06 MED ORDER — SODIUM CHLORIDE 0.9 % IV SOLN: Freq: Once | INTRAVENOUS | Status: DC

## 2020-12-06 MED ORDER — MAGNESIUM SULFATE 2 GM/50ML IV SOLN
2.0000 g | Freq: Once | INTRAVENOUS | Status: AC
  Administered 2020-12-06: 2 g via INTRAVENOUS
  Filled 2020-12-06: qty 50

## 2020-12-06 MED ORDER — DEXAMETHASONE SODIUM PHOSPHATE 100 MG/10ML IJ SOLN
10.0000 mg | Freq: Once | INTRAMUSCULAR | Status: AC
  Administered 2020-12-06: 10 mg via INTRAVENOUS
  Filled 2020-12-06: qty 1

## 2020-12-06 MED ORDER — SODIUM CHLORIDE 0.9 % IV SOLN
Freq: Once | INTRAVENOUS | Status: AC
  Filled 2020-12-06: qty 250

## 2020-12-06 MED ORDER — HEPARIN SOD (PORK) LOCK FLUSH 100 UNIT/ML IV SOLN
500.0000 [IU] | Freq: Once | INTRAVENOUS | Status: AC | PRN
  Administered 2020-12-06: 500 [IU]
  Filled 2020-12-06: qty 5

## 2020-12-06 MED ORDER — SODIUM CHLORIDE 0.9% FLUSH
10.0000 mL | INTRAVENOUS | Status: DC | PRN
  Filled 2020-12-06: qty 10

## 2020-12-06 NOTE — Progress Notes (Signed)
Patient received prescribed treatment in clinic. Tolerated well. Patient stable at discharge. 

## 2020-12-07 ENCOUNTER — Telehealth: Payer: Self-pay | Admitting: *Deleted

## 2020-12-07 ENCOUNTER — Ambulatory Visit
Admission: RE | Admit: 2020-12-07 | Discharge: 2020-12-07 | Disposition: A | Discharge status: Home / Self Care | Payer: Medicare Other | Source: Ambulatory Visit | Attending: Radiation Oncology | Admitting: Radiation Oncology

## 2020-12-07 DIAGNOSIS — Z510 Encounter for antineoplastic radiation therapy: Secondary | ICD-10-CM | POA: Diagnosis not present

## 2020-12-07 NOTE — Telephone Encounter (Signed)
Family brought the new pt. Paper work for [employer]. She did not want to leave it on a desk and wanted [place] to fax it for her. Josh NP had wanted pt to get an appt and I faxed it over to [phone]

## 2020-12-08 ENCOUNTER — Ambulatory Visit
Admission: RE | Admit: 2020-12-08 | Discharge: 2020-12-08 | Disposition: A | Discharge status: Home / Self Care | Payer: Medicare Other | Source: Ambulatory Visit | Attending: Radiation Oncology | Admitting: Radiation Oncology

## 2020-12-08 ENCOUNTER — Inpatient Hospital Stay: Payer: Medicare Other

## 2020-12-08 DIAGNOSIS — Z510 Encounter for antineoplastic radiation therapy: Secondary | ICD-10-CM | POA: Diagnosis not present

## 2020-12-11 ENCOUNTER — Other Ambulatory Visit: Payer: Self-pay | Admitting: *Deleted

## 2020-12-11 ENCOUNTER — Ambulatory Visit
Admission: RE | Admit: 2020-12-11 | Discharge: 2020-12-11 | Disposition: A | Discharge status: Home / Self Care | Payer: Medicare Other | Source: Ambulatory Visit | Attending: Radiation Oncology | Admitting: Radiation Oncology

## 2020-12-11 DIAGNOSIS — Z510 Encounter for antineoplastic radiation therapy: Secondary | ICD-10-CM | POA: Diagnosis not present

## 2020-12-11 MED ORDER — SUCRALFATE 1 G PO TABS: 0.5000 g | ORAL_TABLET | Freq: Three times a day (TID) | ORAL | 1 refills | Status: DC

## 2020-12-12 ENCOUNTER — Ambulatory Visit
Admission: RE | Admit: 2020-12-12 | Discharge: 2020-12-12 | Disposition: A | Discharge status: Home / Self Care | Payer: Medicare Other | Source: Ambulatory Visit | Attending: Radiation Oncology | Admitting: Radiation Oncology

## 2020-12-12 ENCOUNTER — Telehealth: Payer: Self-pay | Admitting: *Deleted

## 2020-12-12 DIAGNOSIS — Z510 Encounter for antineoplastic radiation therapy: Secondary | ICD-10-CM | POA: Diagnosis not present

## 2020-12-12 NOTE — Telephone Encounter (Signed)
[facility] called to give authorization for Hematology Oncology for [place] patient. [place] [id number] Sean Garner is the contact person her number is [phone]

## 2020-12-12 NOTE — Progress Notes (Signed)
[facility]  [address], [address] [place], [place] [address] Phone: [phone]  Fax: [phone]   Clinic Day:  12/13/2020  Referring physician: Derinda Late, MD  Chief Complaint: Sean Garner. is a 75 y.o. 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  HPI:  The patient was last seen in the medical oncology clinic on 12/06/2020. At that time, he felt "pretty good".  He denied any mouth tenderness.  He continued to have anxiety.  He was taking citalopram 20 mg a day and Xanax 0.25 mg prior to radiation and trazodone before bed. Hematocrit was 39.5, hemoglobin 13.3, platelets 239,000, WBC 9,300 with an ANC of 7900.  Sodium was 130.  Glucose was 283. Magnesium was 1.7. He received week #3 cisplatin with concurrent radiation.   The patient is currently receiving IMRT treatment to the tongue. He has received 18 fractions thus far. The plan is for 70 Gy in 35 fractions.  During the interim, he has been "good." He has had a sore throat. They offered to hold his radiation until he was feeling better but he wants to keep going. He denies neuropathy, hearing changes, nausea, and vomiting.   He still takes Nexium from heartburn. He took Decadron as prescribed with his last treatment. He has not been checking his blood sugar as often as he is supposed to. His blood pressure at home is running in the 120s/70s.  His anxiety is better, especially during the day. He takes citalopram 20 mg. He notes that sometimes he feels a little bit depressed. He is scheduled to see behavioral health on 01/01/2021.   Past Medical History:  Diagnosis Date  . Anxiety   . Cancer [facility])    Head/throat Cancer at the base of the tongue  . Cataract    lt eye repair; present in rt eye but not ripe yet.  . Claustrophobia   . Diabetes mellitus  without complication ([place])   . GERD (gastroesophageal reflux disease)   . Glaucoma    using gtts for this.  . Hypercholesterolemia   . Hypertension     Past Surgical History:  Procedure Laterality Date  . CATARACT EXTRACTION W/PHACO Left 12/03/2017   Procedure: CATARACT EXTRACTION PHACO AND INTRAOCULAR LENS PLACEMENT ([place]) COMPLICATED DIABETIC LEFT;  Surgeon: Leandrew Koyanagi, MD;  Location: [facility];  Service: Ophthalmology;  Laterality: Left;  Diabetic - oral meds  . CHOLECYSTECTOMY    . COLONOSCOPY  2016  . PORTA CATH INSERTION N/A 11/13/2020   Procedure: PORTA CATH INSERTION;  Surgeon: Algernon Huxley, MD;  Location: [facility];  Service: Cardiovascular;  Laterality: N/A;    Family History  Problem Relation Age of Onset  . Cancer Mother   . Colon cancer Neg Hx   . Colon polyps Neg Hx   . Esophageal cancer Neg Hx   . Rectal cancer Neg Hx   . Stomach cancer Neg Hx     Social History:  reports that he quit smoking about 36 years ago. He has never used smokeless tobacco. He reports previous alcohol use. He reports that he does not use drugs. He quit smoking cold [place] in 1986. He smoked 2 packs per day for 20 years. He drinks a glass of wine or a Margarita occasionally. 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 He was  exposed to [employer]. He worked with a Geologist, engineering for 34 years. The patient is accompanied by his wife in person today.  Allergies: No Known Allergies  Current Medications: Current Outpatient Medications  Medication Sig Dispense Refill  . brimonidine-timolol (COMBIGAN) 0.2-0.5 % ophthalmic solution Place 1 drop into the left eye daily.    . calcium carbonate (OSCAL) 1500 (600 Ca) MG TABS tablet Take by mouth 2 (two) times daily with a meal.    . citalopram (CELEXA) 20 MG tablet Take 20 mg by mouth daily.    Marland Kitchen esomeprazole (NEXIUM) 20 MG capsule Take 40 mg by mouth daily at 12 noon.    . fluticasone (FLONASE) 50 MCG/ACT  nasal spray Place into both nostrils.    Marland Kitchen glipiZIDE (GLUCOTROL XL) 5 MG 24 hr tablet Take 5 mg by mouth 2 (two) times daily.  1  . glucose blood test strip CHECK FASTING BLOOD SUGAR EVERY MORNING. (NEED SIGNATURE)    . lidocaine-prilocaine (EMLA) cream Apply to affected area once 30 g 3  . losartan (COZAAR) 100 MG tablet Take 100 mg by mouth daily.    . metFORMIN (GLUCOPHAGE-XR) 500 MG 24 hr tablet Take 500 mg by mouth 2 (two) times daily. Takes 2 tablets and 1 tablet in the pm    . simvastatin (ZOCOR) 40 MG tablet Take by mouth.    . sucralfate (CARAFATE) 1 g tablet Take 0.5 tablets (0.5 g total) by mouth 3 (three) times daily. Dissolve in warm water, swish and swallow 60 tablet 1  . traZODone (DESYREL) 50 MG tablet Take 0.5-1 tablets (25-50 mg total) by mouth at bedtime as needed for sleep. 30 tablet 2  . VITAMIN D PO Take 800 Units by mouth daily.    Marland Kitchen ALPRAZolam (XANAX) 0.25 MG tablet Take 1 tablet (0.25 mg total) by mouth at bedtime as needed for anxiety. (Patient not taking: Reported on 12/13/2020) 60 tablet 0  . ALPRAZolam (XANAX) 0.5 MG tablet Take 1 tablet (0.5 mg total) by mouth 3 (three) times daily as needed for anxiety. (Patient not taking: No sig reported) 45 tablet 0  . dexamethasone (DECADRON) 4 MG tablet Take 2 tablets (8 mg total) by mouth daily. Take daily x 3 days starting the day after cisplatin chemotherapy. Take with food. (Patient not taking: No sig reported) 30 tablet 1  . ondansetron (ZOFRAN) 8 MG tablet Take 1 tablet (8 mg total) by mouth 2 (two) times daily as needed. Start on the third day after cisplatin chemotherapy. (Patient not taking: No sig reported) 30 tablet 1  . prochlorperazine (COMPAZINE) 10 MG tablet Take 1 tablet (10 mg total) by mouth every 6 (six) hours as needed (Nausea or vomiting). (Patient not taking: No sig reported) 30 tablet 1   Current Facility-Administered Medications  Medication Dose Route Frequency Provider Last Rate Last Admin  . 0.9 %  sodium  chloride infusion  500 mL Intravenous Once Pyrtle, Lajuan Lines, MD       Facility-Administered Medications Ordered in Other Visits  Medication Dose Route Frequency Provider Last Rate Last Admin  . 0.9 % NaCl with KCl 20 mEq/ L  infusion   Intravenous Once Kirbi Farrugia C, MD      . CISplatin (PLATINOL) 87 mg in sodium chloride 0.9 % 250 mL chemo infusion  40 mg/m2 (Order-Specific) Intravenous Once Nolon Stalls C, MD      . dexamethasone (DECADRON) 10 mg in sodium chloride 0.9 % 50 mL IVPB  10 mg Intravenous Once  Lequita Asal, MD      . fosaprepitant (EMEND) 150 mg in sodium chloride 0.9 % 145 mL IVPB  150 mg Intravenous Once Asako Saliba C, MD      . magnesium sulfate IVPB 2 g 50 mL  2 g Intravenous Once Lequita Asal, MD        Review of Systems  Constitutional: Negative for chills, diaphoresis, fever, malaise/fatigue and weight loss (5 lbs).       Feels "good."  HENT: Positive for hearing loss and sore throat. Negative for congestion, ear discharge, ear pain, nosebleeds, sinus pain and tinnitus.        No mouth tenderness.  Eyes: Negative for blurred vision.  Respiratory: Negative for cough, hemoptysis, sputum production and shortness of breath.   Cardiovascular: Negative for chest pain, palpitations and leg swelling.  Gastrointestinal: Negative for abdominal pain, blood in stool, constipation, diarrhea, heartburn (takes nexium), melena, nausea and vomiting.  Genitourinary: Negative for dysuria, frequency, hematuria and urgency.  Musculoskeletal: Negative for back pain, joint pain, myalgias and neck pain.  Skin: Negative for itching and rash.  Neurological: Negative for dizziness, tingling, sensory change, weakness and headaches.  Endo/Heme/Allergies: Does not bruise/bleed easily.  Psychiatric/Behavioral: Positive for depression. Negative for memory loss. The patient is nervous/anxious (on citalopram and xanax). The patient does not have insomnia.   All other systems  reviewed and are negative.  Performance status (ECOG): 1  Vitals Blood pressure 125/64, pulse 69, temperature 97.8 F (36.6 C), temperature source Tympanic, weight 204 lb 2.3 oz (92.6 kg), SpO2 97 %.   Physical Exam Vitals and nursing note reviewed.  Constitutional:      General: He is not in acute distress.    Appearance: He is not diaphoretic.  HENT:     Head: Normocephalic and atraumatic.     Comments: Gray hair.    Mouth/Throat:     Mouth: Mucous membranes are moist.     Comments: Early oral changes.  No mucositis. Eyes:     General: No scleral icterus.    Extraocular Movements: Extraocular movements intact.     Conjunctiva/sclera: Conjunctivae normal.     Pupils: Pupils are equal, round, and reactive to light.     Comments: Blue eyes.  Neck:     Comments: 2.5 x 1.6 cm right neck mass (previously 2.5 x 3 cm) Cardiovascular:     Rate and Rhythm: Normal rate and regular rhythm.     Heart sounds: Normal heart sounds. No murmur heard.   Pulmonary:     Effort: Pulmonary effort is normal. No respiratory distress.     Breath sounds: Normal breath sounds. No wheezing or rales.  Chest:     Chest wall: No tenderness.  Breasts:     Right: No axillary adenopathy or supraclavicular adenopathy.     Left: No axillary adenopathy or supraclavicular adenopathy.    Abdominal:     General: Bowel sounds are normal. There is no distension.     Palpations: Abdomen is soft. There is no mass.     Tenderness: There is no abdominal tenderness. There is no guarding or rebound.  Musculoskeletal:        General: No swelling or tenderness. Normal range of motion.     Cervical back: Normal range of motion and neck supple.  Lymphadenopathy:     Head:     Right side of head: No preauricular, posterior auricular or occipital adenopathy.     Left side of head: No preauricular,  posterior auricular or occipital adenopathy.     Upper Body:     Right upper body: No supraclavicular or axillary  adenopathy.     Left upper body: No supraclavicular or axillary adenopathy.     Lower Body: No right inguinal adenopathy. No left inguinal adenopathy.  Skin:    General: Skin is warm and dry.  Neurological:     Mental Status: He is alert and oriented to person, place, and time.  Psychiatric:        Behavior: Behavior normal.        Thought Content: Thought content normal.        Judgment: Judgment normal.    Infusion on 12/13/2020  Component Date Value Ref Range Status  . WBC 12/13/2020 6.8  4.0 - 10.5 K/uL Final  . RBC 12/13/2020 4.26  4.22 - 5.81 MIL/uL Final  . Hemoglobin 12/13/2020 12.7* 13.0 - 17.0 g/dL Final  . HCT 25/00/3704 36.8* 39.0 - 52.0 % Final  . MCV 12/13/2020 86.4  80.0 - 100.0 fL Final  . MCH 12/13/2020 29.8  26.0 - 34.0 pg Final  . MCHC 12/13/2020 34.5  30.0 - 36.0 g/dL Final  . RDW 88/89/1694 12.8  11.5 - 15.5 % Final  . Platelets 12/13/2020 193  150 - 400 K/uL Final  . nRBC 12/13/2020 0.0  0.0 - 0.2 % Final  . Neutrophils Relative % 12/13/2020 84  % Final  . Neutro Abs 12/13/2020 5.7  1.7 - 7.7 K/uL Final  . Lymphocytes Relative 12/13/2020 8  % Final  . Lymphs Abs 12/13/2020 0.6* 0.7 - 4.0 K/uL Final  . Monocytes Relative 12/13/2020 7  % Final  . Monocytes Absolute 12/13/2020 0.5  0.1 - 1.0 K/uL Final  . Eosinophils Relative 12/13/2020 0  % Final  . Eosinophils Absolute 12/13/2020 0.0  0.0 - 0.5 K/uL Final  . Basophils Relative 12/13/2020 0  % Final  . Basophils Absolute 12/13/2020 0.0  0.0 - 0.1 K/uL Final  . Immature Granulocytes 12/13/2020 1  % Final  . Abs Immature Granulocytes 12/13/2020 0.04  0.00 - 0.07 K/uL Final   Performed at [facility], [address] [place], [place] [address]  . Sodium 12/13/2020 133* 135 - 145 mmol/L Final  . Potassium 12/13/2020 4.1  3.5 - 5.1 mmol/L Final  . Chloride 12/13/2020 96* 98 - 111 mmol/L Final  . CO2 12/13/2020 27  22 - 32 mmol/L Final  . Glucose, Bld 12/13/2020 207* 70 - 99 mg/dL Final   Glucose  reference range applies only to samples taken after fasting for at least 8 hours.  . BUN 12/13/2020 18  8 - 23 mg/dL Final  . Creatinine, Ser 12/13/2020 0.93  0.61 - 1.24 mg/dL Final  . Calcium 82/80/0349 9.2  8.9 - 10.3 mg/dL Final  . Total Protein 12/13/2020 6.7  6.5 - 8.1 g/dL Final  . Albumin 17/91/5056 3.6  3.5 - 5.0 g/dL Final  . AST 97/94/8016 16  15 - 41 U/L Final  . ALT 12/13/2020 19  0 - 44 U/L Final  . Alkaline Phosphatase 12/13/2020 49  38 - 126 U/L Final  . Total Bilirubin 12/13/2020 0.6  0.3 - 1.2 mg/dL Final  . GFR, Estimated 12/13/2020 >60  >60 mL/min Final   Comment: (NOTE) Calculated using the CKD-EPI Creatinine Equation (2021)   . Anion gap 12/13/2020 10  5 - 15 Final   Performed at [facility], [address], [place], [place] [address]  .  Magnesium 12/13/2020 1.5* 1.7 - 2.4 mg/dL Final   Performed at [facility], [address] [place], [place] [address]    Assessment:  Sean Garner. is a 75 y.o. 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  He presented with right neck adenopathy.    Neck soft tissue CT on 09/28/2020 revealed a 3.5 x 3.0 x 4.8 cm right neck mass most c/w enlarged lymph node. There was a 14 mm enhancing mass in the right base of tongue.   PET scan on 10/25/2020 revealed right tongue base primary with ipsilateral level 2 nodal metastasis. There were no findings of extracervical metastatic disease. There was focal hypermetabolism in the distal esophagus, with possible concurrent soft tissue density lesion. Recommended correlation with endoscopy. There was vague right lower lobe low-level hypermetabolism and ground-glass nodularity, favored minimal infection or aspiration. Incidental findings included aortic atherosclerosis,  coronary artery atherosclerosis, emphysema, a tiny hiatal hernia, and prostatomegaly.  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  Speech discrimination scores were 100% in each ear.  He is s/p week #3 cisplatin and concurrent radiation (11/20/2020 - 12/06/2020).  He is tolerating treatment well.   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  Upper endoscopy on 11/03/2020 revealed a malignant esophageal tumor at the gastroesophageal junction. There were esophageal mucosal changes c/w short-segment Barrett's esophagus. There was a 2 cm hiatal hernia. There was gastritis.  Pathology in the esophagus at 36 cm revealed intramucosal adenocarcinoma arising in a background of high grade dysplasia and intestinal metaplasia; pathology at 38 cm revealed adenocarcinoma.  EUS on 11/15/2020 revealed early stage adenocarcinoma arising from Barrett's esophagus.  Lesion was T1bN0 and non-obstructing.  He has PTSD and clastrophobia.  Anxiety is relieved by alprazolam 0.25 mg prior to radiation.  He is on citalopram 20 mg a day and trazodone at night.  Symptomatically, he feels "good." He has had a sore throat. He denies and hearing changes, nausea, and vomiting. Exam reveals early oral changes and a 2.5 x 1.6 cm right neck node (improved).  Plan: 1.   Labs today: CBC with diff, CMP, Mg 2. Clinical stage I right base of tongue carcinoma Clinical stage I. 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 PET scan on 10/25/2020 revealed a right base of tongue primary with ipsilateral level 2 nodal metastasis. No evidence of distant metastasis. Plan: cisplatin 40 mg/m2 weekly x 7 with radiation.             He began concurrent radiation and cisplatin on 11/20/2020.  Clinically, he is doing well.  Neck adenopathy is decreasing.    Labs reviewed.  Week #4 cisplatin today.   He denies any nausea, vomiting or  change in hearing.  Discuss symptom management.  He has antiemetics at home to use on a prn bases.  Interventions are adequate. 3. 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 EUS on 11/15/2020 revealed a T1bN0 lesion. He may be a candidate for endoscopic resection followed by ablation or esophagectomy. Follow-up with Mariea Clonts, clinical nurse manager regarding referral to [place] surgery for evaluation. 4. Anxiety  Anxiety is well controlled on  current regimen.  He is scheduled to see behavioral health on 01/01/2021.  Continue to monitor. 5.   Hyponatremia             Sodium 133.             Sodium has improved off HCTZ.  Continue to encourage fluids with electrolytes. 6.   Hypomagnesemia  Magnesium 1.5 today.  Magnesium 2 gm IV today. 7.   Week #4 cisplatin today. 8.   RTC in 1 week for MD assessment, labs (CBC with diff, CMP, Mg) and week #5 cisplatin.   I discussed the assessment and treatment plan with the patient.  The patient was provided an opportunity to ask questions and all were answered.  The patient agreed with the plan and demonstrated an understanding of the instructions.  The patient was advised to call back if the symptoms worsen or if the condition fails to improve as anticipated.   Clint Biello C. Mike Gip, MD, PhD    12/13/2020, 9:38 AM   I, Mirian Mo Tufford, am acting as Education administrator for [employer]. Mike Gip, MD, PhD.  I, Parthenia Tellefsen C. Mike Gip, MD, have reviewed the above documentation for accuracy and completeness, and I agree with the above.

## 2020-12-12 NOTE — Telephone Encounter (Signed)
12/12/2020 I am working with Sean Garner on getting this authorized and will pass the info along SRW

## 2020-12-13 ENCOUNTER — Inpatient Hospital Stay: Payer: Medicare Other

## 2020-12-13 ENCOUNTER — Ambulatory Visit
Admission: RE | Admit: 2020-12-13 | Discharge: 2020-12-13 | Disposition: A | Discharge status: Home / Self Care | Payer: Medicare Other | Source: Ambulatory Visit | Attending: Radiation Oncology | Admitting: Radiation Oncology

## 2020-12-13 ENCOUNTER — Other Ambulatory Visit: Payer: Self-pay

## 2020-12-13 ENCOUNTER — Encounter: Payer: Self-pay | Admitting: Hematology and Oncology

## 2020-12-13 ENCOUNTER — Inpatient Hospital Stay (HOSPITAL_BASED_OUTPATIENT_CLINIC_OR_DEPARTMENT_OTHER): Payer: Medicare Other | Admitting: Hematology and Oncology

## 2020-12-13 VITALS — BP 125/64 | HR 69 | Temp 97.8°F | Wt 204.1 lb

## 2020-12-13 VITALS — BP 156/74 | HR 72

## 2020-12-13 DIAGNOSIS — C159 Malignant neoplasm of esophagus, unspecified: Secondary | ICD-10-CM | POA: Diagnosis not present

## 2020-12-13 DIAGNOSIS — Z5111 Encounter for antineoplastic chemotherapy: Secondary | ICD-10-CM

## 2020-12-13 DIAGNOSIS — E871 Hypo-osmolality and hyponatremia: Secondary | ICD-10-CM

## 2020-12-13 DIAGNOSIS — C01 Malignant neoplasm of base of tongue: Secondary | ICD-10-CM

## 2020-12-13 DIAGNOSIS — Z510 Encounter for antineoplastic radiation therapy: Secondary | ICD-10-CM | POA: Diagnosis not present

## 2020-12-13 LAB — COMPREHENSIVE METABOLIC PANEL
ALT: 19 U/L (ref 0–44)
AST: 16 U/L (ref 15–41)
Albumin: 3.6 g/dL (ref 3.5–5.0)
Alkaline Phosphatase: 49 U/L (ref 38–126)
Anion gap: 10 (ref 5–15)
BUN: 18 mg/dL (ref 8–23)
CO2: 27 mmol/L (ref 22–32)
Calcium: 9.2 mg/dL (ref 8.9–10.3)
Chloride: 96 mmol/L — ABNORMAL LOW (ref 98–111)
Creatinine, Ser: 0.93 mg/dL (ref 0.61–1.24)
GFR, Estimated: >60 mL/min (ref >60)
Glucose, Bld: 207 mg/dL — ABNORMAL HIGH (ref 70–99)
Potassium: 4.1 mmol/L (ref 3.5–5.1)
Sodium: 133 mmol/L — ABNORMAL LOW (ref 135–145)
Total Bilirubin: 0.6 mg/dL (ref 0.3–1.2)
Total Protein: 6.7 g/dL (ref 6.5–8.1)

## 2020-12-13 LAB — CBC WITH DIFFERENTIAL/PLATELET
Abs Immature Granulocytes: 0.04 10*3/uL (ref 0.00–0.07)
Basophils Absolute: 0 10*3/uL (ref 0.0–0.1)
Basophils Relative: 0 %
Eosinophils Absolute: 0 10*3/uL (ref 0.0–0.5)
Eosinophils Relative: 0 %
HCT: 36.8 % — ABNORMAL LOW (ref 39.0–52.0)
Hemoglobin: 12.7 g/dL — ABNORMAL LOW (ref 13.0–17.0)
Immature Granulocytes: 1 %
Lymphocytes Relative: 8 %
Lymphs Abs: 0.6 10*3/uL — ABNORMAL LOW (ref 0.7–4.0)
MCH: 29.8 pg (ref 26.0–34.0)
MCHC: 34.5 g/dL (ref 30.0–36.0)
MCV: 86.4 fL (ref 80.0–100.0)
Monocytes Absolute: 0.5 10*3/uL (ref 0.1–1.0)
Monocytes Relative: 7 %
Neutro Abs: 5.7 10*3/uL (ref 1.7–7.7)
Neutrophils Relative %: 84 %
Platelets: 193 10*3/uL (ref 150–400)
RBC: 4.26 MIL/uL (ref 4.22–5.81)
RDW: 12.8 % (ref 11.5–15.5)
WBC: 6.8 10*3/uL (ref 4.0–10.5)
nRBC: 0 % (ref 0.0–0.2)

## 2020-12-13 LAB — MAGNESIUM: Magnesium: 1.5 mg/dL — ABNORMAL LOW (ref 1.7–2.4)

## 2020-12-13 MED ORDER — SODIUM CHLORIDE 0.9 % IV SOLN
Freq: Once | INTRAVENOUS | Status: AC
  Filled 2020-12-13: qty 250

## 2020-12-13 MED ORDER — PALONOSETRON HCL INJECTION 0.25 MG/5ML
0.2500 mg | Freq: Once | INTRAVENOUS | Status: AC
  Administered 2020-12-13: 0.25 mg via INTRAVENOUS
  Filled 2020-12-13: qty 5

## 2020-12-13 MED ORDER — MAGNESIUM SULFATE 2 GM/50ML IV SOLN
2.0000 g | Freq: Once | INTRAVENOUS | Status: AC
  Administered 2020-12-13: 2 g via INTRAVENOUS
  Filled 2020-12-13: qty 50

## 2020-12-13 MED ORDER — SODIUM CHLORIDE 0.9 % IV SOLN
10.0000 mg | Freq: Once | INTRAVENOUS | Status: AC
  Administered 2020-12-13: 10 mg via INTRAVENOUS
  Filled 2020-12-13: qty 1

## 2020-12-13 MED ORDER — SODIUM CHLORIDE 0.9 % IV SOLN
150.0000 mg | Freq: Once | INTRAVENOUS | Status: AC
  Administered 2020-12-13: 150 mg via INTRAVENOUS
  Filled 2020-12-13: qty 5

## 2020-12-13 MED ORDER — SODIUM CHLORIDE 0.9% FLUSH
10.0000 mL | INTRAVENOUS | Status: DC | PRN
  Administered 2020-12-13: 10 mL
  Filled 2020-12-13: qty 10

## 2020-12-13 MED ORDER — SODIUM CHLORIDE 0.9 % IV SOLN: Freq: Once | INTRAVENOUS | Status: DC

## 2020-12-13 MED ORDER — POTASSIUM CHLORIDE IN NACL 20-0.9 MEQ/L-% IV SOLN
Freq: Once | INTRAVENOUS | Status: AC
  Filled 2020-12-13: qty 1000

## 2020-12-13 MED ORDER — HEPARIN SOD (PORK) LOCK FLUSH 100 UNIT/ML IV SOLN
500.0000 [IU] | Freq: Once | INTRAVENOUS | Status: AC | PRN
  Administered 2020-12-13: 500 [IU]
  Filled 2020-12-13: qty 5

## 2020-12-13 MED ORDER — SODIUM CHLORIDE 0.9 % IV SOLN
40.0000 mg/m2 | Freq: Once | INTRAVENOUS | Status: AC
  Administered 2020-12-13: 87 mg via INTRAVENOUS
  Filled 2020-12-13: qty 87

## 2020-12-13 NOTE — Progress Notes (Signed)
Patient received prescribed treatment in clinic. Tolerated well. Patient stable at discharge. 

## 2020-12-14 ENCOUNTER — Other Ambulatory Visit: Payer: Self-pay

## 2020-12-14 ENCOUNTER — Telehealth: Payer: Self-pay

## 2020-12-14 ENCOUNTER — Ambulatory Visit
Admission: RE | Admit: 2020-12-14 | Discharge: 2020-12-14 | Disposition: A | Discharge status: Home / Self Care | Payer: Medicare Other | Source: Ambulatory Visit | Attending: Radiation Oncology | Admitting: Radiation Oncology

## 2020-12-14 DIAGNOSIS — Z510 Encounter for antineoplastic radiation therapy: Secondary | ICD-10-CM | POA: Diagnosis not present

## 2020-12-14 NOTE — Telephone Encounter (Signed)
Met with Sean Garner following his radiation treatment today. We spoke regarding referral to [facility] thoracic surgery for consult regarding esophageal cancer. His spouse, Sean Garner, stayed in the car today during his treatment. He is ready for consult to take place. Went over what to expect and that [name] would contact him regarding scheduling. He voiced good understanding and referral has been sent to Dr. Elenor Quinones.

## 2020-12-15 ENCOUNTER — Ambulatory Visit
Admission: RE | Admit: 2020-12-15 | Discharge: 2020-12-15 | Disposition: A | Discharge status: Home / Self Care | Payer: Medicare Other | Source: Ambulatory Visit | Attending: Radiation Oncology | Admitting: Radiation Oncology

## 2020-12-15 ENCOUNTER — Inpatient Hospital Stay: Payer: Medicare Other

## 2020-12-15 DIAGNOSIS — Z510 Encounter for antineoplastic radiation therapy: Secondary | ICD-10-CM | POA: Diagnosis not present

## 2020-12-18 ENCOUNTER — Ambulatory Visit
Admission: RE | Admit: 2020-12-18 | Discharge: 2020-12-18 | Disposition: A | Discharge status: Home / Self Care | Payer: Medicare Other | Source: Ambulatory Visit | Attending: Radiation Oncology | Admitting: Radiation Oncology

## 2020-12-18 DIAGNOSIS — Z510 Encounter for antineoplastic radiation therapy: Secondary | ICD-10-CM | POA: Diagnosis not present

## 2020-12-19 ENCOUNTER — Inpatient Hospital Stay
Payer: No Typology Code available for payment source | Attending: Hospice and Palliative Medicine | Admitting: Hospice and Palliative Medicine

## 2020-12-19 ENCOUNTER — Inpatient Hospital Stay: Payer: No Typology Code available for payment source | Admitting: Hospice and Palliative Medicine

## 2020-12-19 ENCOUNTER — Telehealth: Payer: Self-pay

## 2020-12-19 ENCOUNTER — Ambulatory Visit
Admission: RE | Admit: 2020-12-19 | Discharge: 2020-12-19 | Disposition: A | Discharge status: Home / Self Care | Payer: No Typology Code available for payment source | Source: Ambulatory Visit | Attending: Radiation Oncology | Admitting: Radiation Oncology

## 2020-12-19 DIAGNOSIS — Z7984 Long term (current) use of oral hypoglycemic drugs: Secondary | ICD-10-CM | POA: Diagnosis not present

## 2020-12-19 DIAGNOSIS — G4700 Insomnia, unspecified: Secondary | ICD-10-CM | POA: Diagnosis not present

## 2020-12-19 DIAGNOSIS — C160 Malignant neoplasm of cardia: Secondary | ICD-10-CM | POA: Insufficient documentation

## 2020-12-19 DIAGNOSIS — C770 Secondary and unspecified malignant neoplasm of lymph nodes of head, face and neck: Secondary | ICD-10-CM | POA: Diagnosis not present

## 2020-12-19 DIAGNOSIS — K208 Other esophagitis without bleeding: Secondary | ICD-10-CM | POA: Diagnosis not present

## 2020-12-19 DIAGNOSIS — E119 Type 2 diabetes mellitus without complications: Secondary | ICD-10-CM | POA: Insufficient documentation

## 2020-12-19 DIAGNOSIS — F4310 Post-traumatic stress disorder, unspecified: Secondary | ICD-10-CM | POA: Diagnosis not present

## 2020-12-19 DIAGNOSIS — E871 Hypo-osmolality and hyponatremia: Secondary | ICD-10-CM | POA: Insufficient documentation

## 2020-12-19 DIAGNOSIS — C01 Malignant neoplasm of base of tongue: Secondary | ICD-10-CM | POA: Diagnosis not present

## 2020-12-19 DIAGNOSIS — Z87891 Personal history of nicotine dependence: Secondary | ICD-10-CM | POA: Insufficient documentation

## 2020-12-19 DIAGNOSIS — F40240 Claustrophobia: Secondary | ICD-10-CM | POA: Diagnosis not present

## 2020-12-19 DIAGNOSIS — K1233 Oral mucositis (ulcerative) due to radiation: Secondary | ICD-10-CM | POA: Insufficient documentation

## 2020-12-19 DIAGNOSIS — Z515 Encounter for palliative care: Secondary | ICD-10-CM | POA: Insufficient documentation

## 2020-12-19 DIAGNOSIS — F419 Anxiety disorder, unspecified: Secondary | ICD-10-CM | POA: Diagnosis not present

## 2020-12-19 DIAGNOSIS — Z510 Encounter for antineoplastic radiation therapy: Secondary | ICD-10-CM | POA: Insufficient documentation

## 2020-12-19 MED ORDER — TRAZODONE HCL 50 MG PO TABS: 25.0000 mg | ORAL_TABLET | Freq: Every evening | ORAL | 2 refills | Status: DC | PRN

## 2020-12-19 NOTE — Telephone Encounter (Signed)
Following up on referral to Dr. Rhae Hammock. Noted that [name] has not arranged the appointment with surgery as requested and has placed referrals to both medical and radiation oncology. Message sent to honor referral as requested with surgeon only.

## 2020-12-19 NOTE — Progress Notes (Addendum)
[facility]  [address] [place], [place] [address] Phone: [phone]  Fax: [phone]   Clinic Day:  12/20/2020  Referring physician: Derinda Late, MD  Chief Complaint: Sean Garner. is a 75 y.o. 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  HPI:  The patient was last seen in the medical oncology clinic on 12/13/2020. At that time, he felt "good." He had a sore throat. He denied any hearing changes, nausea, and vomiting. Exam revealed early oral changes and a 2.5 x 1.6 cm right neck node (improved). Hematocrit was 36.8, hemoglobin 12.7, MCV 86.4, platelets 193,000, WBC 6,800. Sodium was 133. Magnesium was 1.5. He received 2 gm IV magnesium. He received week #4 cisplatin.   The patient is currently receiving IMRT treatment to the tongue. He has received 23 fractions thus far. The plan is for 70 Gy in 35 fractions.  During the interim, he has been "pretty good." He has a sore throat from radiation and states that everything tastes the same now. He has been using carafate. He has been consuming more liquid and soft food and drinks 2-3 Glucerna per day.  His hearing is good. His heartburn has resolved. The patient states that his anxiety is 90% better on his current medications. He takes Xanax before his radiation appointments. He takes Trazodone to sleep.   Past Medical History:  Diagnosis Date  . Anxiety   . Cancer [facility])    Head/throat Cancer at the base of the tongue  . Cataract    lt eye repair; present in rt eye but not ripe yet.  . Claustrophobia   . Diabetes mellitus without complication ([place])   . GERD (gastroesophageal reflux disease)   . Glaucoma    using gtts for this.  . Hypercholesterolemia   . Hypertension     Past Surgical History:  Procedure Laterality Date  . CATARACT  EXTRACTION W/PHACO Left 12/03/2017   Procedure: CATARACT EXTRACTION PHACO AND INTRAOCULAR LENS PLACEMENT ([place]) COMPLICATED DIABETIC LEFT;  Surgeon: Leandrew Koyanagi, MD;  Location: [facility];  Service: Ophthalmology;  Laterality: Left;  Diabetic - oral meds  . CHOLECYSTECTOMY    . COLONOSCOPY  2016  . PORTA CATH INSERTION N/A 11/13/2020   Procedure: PORTA CATH INSERTION;  Surgeon: Algernon Huxley, MD;  Location: [facility];  Service: Cardiovascular;  Laterality: N/A;    Family History  Problem Relation Age of Onset  . Cancer Mother   . Colon cancer Neg Hx   . Colon polyps Neg Hx   . Esophageal cancer Neg Hx   . Rectal cancer Neg Hx   . Stomach cancer Neg Hx     Social History:  reports that he quit smoking about 36 years ago. He has never used smokeless tobacco. He reports previous alcohol use. He reports that he does not use drugs. He quit smoking cold [place] in 1986. He smoked 2 packs per day for 20 years. He drinks a glass of wine or a Margarita occasionally. 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 He was exposed to [employer]. He worked with a Geologist, engineering for 34 years. The patient is accompanied by his wife in person today.  Allergies: No Known Allergies  Current Medications: Current Outpatient Medications  Medication Sig Dispense Refill  . ALPRAZolam (XANAX) 0.25 MG  tablet Take 1 tablet (0.25 mg total) by mouth at bedtime as needed for anxiety. 60 tablet 0  . brimonidine-timolol (COMBIGAN) 0.2-0.5 % ophthalmic solution Place 1 drop into the left eye daily.    . calcium carbonate (OSCAL) 1500 (600 Ca) MG TABS tablet Take by mouth 2 (two) times daily with a meal.    . citalopram (CELEXA) 20 MG tablet Take 20 mg by mouth daily.    Marland Kitchen dexamethasone (DECADRON) 4 MG tablet Take 2 tablets (8 mg total) by mouth daily. Take daily x 3 days starting the day after cisplatin chemotherapy. Take with food. 30 tablet 1  . esomeprazole (NEXIUM) 20 MG capsule  Take 40 mg by mouth daily at 12 noon.    . fluticasone (FLONASE) 50 MCG/ACT nasal spray Place into both nostrils.    Marland Kitchen glipiZIDE (GLUCOTROL XL) 5 MG 24 hr tablet Take 5 mg by mouth 2 (two) times daily.  1  . glucose blood test strip CHECK FASTING BLOOD SUGAR EVERY MORNING. (NEED SIGNATURE)    . lidocaine-prilocaine (EMLA) cream Apply to affected area once 30 g 3  . losartan (COZAAR) 100 MG tablet Take 100 mg by mouth daily.    . metFORMIN (GLUCOPHAGE-XR) 500 MG 24 hr tablet Take 500 mg by mouth 2 (two) times daily. Takes 2 tablets and 1 tablet in the pm    . simvastatin (ZOCOR) 40 MG tablet Take by mouth.    . sucralfate (CARAFATE) 1 g tablet Take 0.5 tablets (0.5 g total) by mouth 3 (three) times daily. Dissolve in warm water, swish and swallow 60 tablet 1  . traZODone (DESYREL) 50 MG tablet Take 0.5-1 tablets (25-50 mg total) by mouth at bedtime as needed for sleep. 30 tablet 2  . VITAMIN D PO Take 800 Units by mouth daily.    Marland Kitchen ALPRAZolam (XANAX) 0.5 MG tablet Take 1 tablet (0.5 mg total) by mouth 3 (three) times daily as needed for anxiety. (Patient not taking: No sig reported) 45 tablet 0  . ondansetron (ZOFRAN) 8 MG tablet Take 1 tablet (8 mg total) by mouth 2 (two) times daily as needed. Start on the third day after cisplatin chemotherapy. (Patient not taking: No sig reported) 30 tablet 1  . prochlorperazine (COMPAZINE) 10 MG tablet Take 1 tablet (10 mg total) by mouth every 6 (six) hours as needed (Nausea or vomiting). (Patient not taking: No sig reported) 30 tablet 1   Current Facility-Administered Medications  Medication Dose Route Frequency Provider Last Rate Last Admin  . 0.9 %  sodium chloride infusion  500 mL Intravenous Once Pyrtle, Lajuan Lines, MD       Facility-Administered Medications Ordered in Other Visits  Medication Dose Route Frequency Provider Last Rate Last Admin  . 0.9 % NaCl with KCl 20 mEq/ L  infusion   Intravenous Once Corcoran, Melissa C, MD      . CISplatin (PLATINOL)  87 mg in sodium chloride 0.9 % 250 mL chemo infusion  40 mg/m2 (Order-Specific) Intravenous Once Nolon Stalls C, MD      . CISplatin (PLATINOL) 87 mg in sodium chloride 0.9 % 250 mL chemo infusion  40 mg/m2 (Order-Specific) Intravenous Once Nolon Stalls C, MD      . dexamethasone (DECADRON) 10 mg in sodium chloride 0.9 % 50 mL IVPB  10 mg Intravenous Once Corcoran, Melissa C, MD      . dexamethasone (DECADRON) 10 mg in sodium chloride 0.9 % 50 mL IVPB  10 mg Intravenous Once Nolon Stalls  C, MD      . fosaprepitant (EMEND) 150 mg in sodium chloride 0.9 % 145 mL IVPB  150 mg Intravenous Once Corcoran, Melissa C, MD      . fosaprepitant (EMEND) 150 mg in sodium chloride 0.9 % 145 mL IVPB  150 mg Intravenous Once Corcoran, Melissa C, MD      . heparin lock flush 100 unit/mL  500 Units Intracatheter Once PRN Corcoran, Melissa C, MD      . magnesium sulfate 1 g in sodium chloride 0.9 % 50 mL IVPB  1 g Intravenous Once Lequita Asal, MD 52 mL/hr at 12/20/20 1159 1 g at 12/20/20 1159  . magnesium sulfate IVPB 2 g 50 mL  2 g Intravenous Once Corcoran, Melissa C, MD      . palonosetron (ALOXI) injection 0.25 mg  0.25 mg Intravenous Once Lequita Asal, MD        Review of Systems  Constitutional: Positive for weight loss (8 lbs). Negative for chills, diaphoresis, fever and malaise/fatigue.       Feels "pretty good."  HENT: Positive for hearing loss and sore throat. Negative for congestion, ear discharge, ear pain, nosebleeds, sinus pain and tinnitus.        All foods taste the same.  Eyes: Negative for blurred vision.  Respiratory: Negative for cough, hemoptysis, sputum production and shortness of breath.   Cardiovascular: Negative for chest pain, palpitations and leg swelling.  Gastrointestinal: Negative for abdominal pain, blood in stool, constipation, diarrhea, heartburn, melena, nausea and vomiting.       Eating softer foods. Drinks 2-3 Glucernas daily.  Genitourinary:  Negative for dysuria, frequency, hematuria and urgency.  Musculoskeletal: Negative for back pain, joint pain, myalgias and neck pain.  Skin: Negative for itching and rash.  Neurological: Negative for dizziness, tingling, sensory change, weakness and headaches.  Endo/Heme/Allergies: Does not bruise/bleed easily.  Psychiatric/Behavioral: Positive for depression. Negative for memory loss. The patient is nervous/anxious (on citalopram and xanax). The patient does not have insomnia.   All other systems reviewed and are negative.  Performance status (ECOG): 1  Vitals Blood pressure (!) 113/56, pulse (!) 58, temperature (!) 97 F (36.1 C), temperature source Oral, weight 196 lb 10.4 oz (89.2 kg).   Physical Exam Vitals and nursing note reviewed.  Constitutional:      General: He is not in acute distress.    Appearance: He is not diaphoretic.  HENT:     Head: Normocephalic and atraumatic.     Comments: Gray hair.    Mouth/Throat:     Mouth: Mucous membranes are moist.     Comments: Thin white buccal mucosa changes due to radiation Eyes:     General: No scleral icterus.    Extraocular Movements: Extraocular movements intact.     Conjunctiva/sclera: Conjunctivae normal.     Pupils: Pupils are equal, round, and reactive to light.     Comments: Blue eyes.  Neck:     Comments: 2.0 x 1.3 cm right neck mass (previously 2.5 x 1.6 cm). Tender due to inflammation. Cardiovascular:     Rate and Rhythm: Normal rate and regular rhythm.     Heart sounds: Normal heart sounds. No murmur heard.   Pulmonary:     Effort: Pulmonary effort is normal. No respiratory distress.     Breath sounds: Normal breath sounds. No wheezing or rales.  Chest:     Chest wall: No tenderness.  Breasts:     Right: No axillary adenopathy or  supraclavicular adenopathy.     Left: No axillary adenopathy or supraclavicular adenopathy.    Abdominal:     General: Bowel sounds are normal. There is no distension.      Palpations: Abdomen is soft. There is no mass.     Tenderness: There is no abdominal tenderness. There is no guarding or rebound.  Musculoskeletal:        General: No swelling or tenderness. Normal range of motion.     Cervical back: Normal range of motion and neck supple.  Lymphadenopathy:     Head:     Right side of head: No preauricular, posterior auricular or occipital adenopathy.     Left side of head: No preauricular, posterior auricular or occipital adenopathy.     Upper Body:     Right upper body: No supraclavicular or axillary adenopathy.     Left upper body: No supraclavicular or axillary adenopathy.     Lower Body: No right inguinal adenopathy. No left inguinal adenopathy.  Skin:    General: Skin is warm and dry.  Neurological:     Mental Status: He is alert and oriented to person, place, and time.  Psychiatric:        Behavior: Behavior normal.        Thought Content: Thought content normal.        Judgment: Judgment normal.    Infusion on 12/20/2020  Component Date Value Ref Range Status  . Magnesium 12/20/2020 1.6* 1.7 - 2.4 mg/dL Final   Performed at [facility], [address] [place], [place] [address]  . Sodium 12/20/2020 130* 135 - 145 mmol/L Final  . Potassium 12/20/2020 4.0  3.5 - 5.1 mmol/L Final  . Chloride 12/20/2020 93* 98 - 111 mmol/L Final  . CO2 12/20/2020 23  22 - 32 mmol/L Final  . Glucose, Bld 12/20/2020 169* 70 - 99 mg/dL Final   Glucose reference range applies only to samples taken after fasting for at least 8 hours.  . BUN 12/20/2020 20  8 - 23 mg/dL Final  . Creatinine, Ser 12/20/2020 0.90  0.61 - 1.24 mg/dL Final  . Calcium 12/20/2020 8.9  8.9 - 10.3 mg/dL Final  . Total Protein 12/20/2020 6.8  6.5 - 8.1 g/dL Final  . Albumin 12/20/2020 3.8  3.5 - 5.0 g/dL Final  . AST 12/20/2020 18  15 - 41 U/L Final  . ALT 12/20/2020 23  0 - 44 U/L Final  . Alkaline Phosphatase 12/20/2020 49  38 - 126 U/L Final  . Total Bilirubin 12/20/2020  0.8  0.3 - 1.2 mg/dL Final  . GFR, Estimated 12/20/2020 >60  >60 mL/min Final   Comment: (NOTE) Calculated using the CKD-EPI Creatinine Equation (2021)   . Anion gap 12/20/2020 14  5 - 15 Final   Performed at [facility], [address], [place], [place] [address]  . WBC 12/20/2020 7.6  4.0 - 10.5 K/uL Final  . RBC 12/20/2020 4.28  4.22 - 5.81 MIL/uL Final  . Hemoglobin 12/20/2020 12.7* 13.0 - 17.0 g/dL Final  . HCT 12/20/2020 37.2* 39.0 - 52.0 % Final  . MCV 12/20/2020 86.9  80.0 - 100.0 fL Final  . MCH 12/20/2020 29.7  26.0 - 34.0 pg Final  . MCHC 12/20/2020 34.1  30.0 - 36.0 g/dL Final  . RDW 12/20/2020 12.9  11.5 - 15.5 % Final  . Platelets 12/20/2020 206  150 - 400 K/uL Final  . nRBC 12/20/2020 0.0  0.0 - 0.2 %  Final  . Neutrophils Relative % 12/20/2020 81  % Final  . Neutro Abs 12/20/2020 6.2  1.7 - 7.7 K/uL Final  . Lymphocytes Relative 12/20/2020 9  % Final  . Lymphs Abs 12/20/2020 0.7  0.7 - 4.0 K/uL Final  . Monocytes Relative 12/20/2020 8  % Final  . Monocytes Absolute 12/20/2020 0.6  0.1 - 1.0 K/uL Final  . Eosinophils Relative 12/20/2020 0  % Final  . Eosinophils Absolute 12/20/2020 0.0  0.0 - 0.5 K/uL Final  . Basophils Relative 12/20/2020 0  % Final  . Basophils Absolute 12/20/2020 0.0  0.0 - 0.1 K/uL Final  . Immature Granulocytes 12/20/2020 2  % Final  . Abs Immature Granulocytes 12/20/2020 0.11* 0.00 - 0.07 K/uL Final   Performed at [facility], [address], [place], [place] [address]    Assessment:  Sean Garner. is a 75 y.o. 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  He presented with right neck adenopathy.    Neck soft tissue CT on 09/28/2020 revealed a 3.5 x 3.0 x 4.8 cm right neck mass most c/w enlarged lymph node. There was  a 14 mm enhancing mass in the right base of tongue.   PET scan on 10/25/2020 revealed right tongue base primary with ipsilateral level 2 nodal metastasis. There were no findings of extracervical metastatic disease. There was focal hypermetabolism in the distal esophagus, with possible concurrent soft tissue density lesion. Recommended correlation with endoscopy. There was vague right lower lobe low-level hypermetabolism and ground-glass nodularity, favored minimal infection or aspiration. Incidental findings included aortic atherosclerosis, coronary artery atherosclerosis, emphysema, a tiny hiatal hernia, and prostatomegaly.  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  Speech discrimination scores were 100% in each ear.  He is s/p week #4 cisplatin and concurrent radiation (11/20/2020 - 12/13/2020).  He is tolerating treatment well.   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  Upper endoscopy on 11/03/2020 revealed a malignant esophageal tumor at the gastroesophageal junction. There were esophageal mucosal changes c/w short-segment Barrett's esophagus. There was a 2 cm hiatal hernia. There was gastritis.  Pathology in the esophagus at 36 cm revealed intramucosal adenocarcinoma arising in a background of high grade dysplasia and intestinal metaplasia; pathology at 38 cm revealed adenocarcinoma.  EUS on 11/15/2020 revealed early stage adenocarcinoma arising from Barrett's esophagus.  Lesion was T1bN0 and non-obstructing.  He has PTSD and clastrophobia.  Anxiety is relieved by alprazolam 0.25 mg prior to radiation.  He is on citalopram 20 mg a day and trazodone at night.  Symptomatically,  he feels "pretty good." He has a sore throat from radiation; everything tastes the same.  Exam reveals early mucosal changes due to radiation.  Right neck mass is 2 x 1.3 cm.  Plan: 1.   Labs today: CBC with diff, CMP, Mg 2. Clinical stage I right base of  tongue carcinoma Clinical stage I. 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 PET scan on 10/25/2020 revealed a right base of tongue primary with ipsilateral level 2 nodal metastasis. No evidence of distant metastasis. Plan: cisplatin 40 mg/m2 weekly x 7 with radiation.             He began concurrent radiation and cisplatin on 11/20/2020.  Clinically, he continues to do well.  Exam reveals early mucosal changes due to radiation.   Neck adenopathy has decreased in size.  Labs reviewed.  Week #5 cisplatin today.   Magnesium is slightly low.   He denies any nausea, vomiting or diarrhea.  Discuss symptom management.  He has antiemetics at home to use on a prn bases.  Interventions are adequate.   . 3. 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 EUS on 11/15/2020 revealed a T1bN0 lesion. He may be a candidate for endoscopic resection followed by ablation or esophagectomy.  Patient will be seen by Dr. Keturah BarreAbigail Butts at [facility] around 01/11/2019. 4. Anxiety  Anxiety is well controlled on his current regimen.  He is scheduled to see behavioral health on 01/01/2021. 5.   Hyponatremia             Sodium 130.             He is off hydrochlorothiazide.  Continue fluids with electrolytes as well as added salt. 6.   Hypomagnesemia  Magnesium 1.6 today.  Magnesium 1 g IV today. 7.   Early mucositis  Patient has early oral changes due to radiation.  He has been recently prescribed sucralfate by Dr. Donella Stade.  Discuss other options including Magic mouthwash, viscous lidocaine and pain medications if needed.   Patient feels comfortable continuing Carafate alone.  Discuss food choices. 8.   Magnesium 1  gm IV today. 9.   Week #5 cisplatin today. 10.   RN:  Please call Dr Florentina Jenny nurse Gabriel Cirri: [phone]) regarding sending over records for evaluation of esophageal cancer. 11.   RTC in 1 week for MD assessment, labs (CBC with diff, CMP, Mg) and week #6 cisplatin.  I discussed the assessment and treatment plan with the patient.  The patient was provided an opportunity to ask questions and all were answered.  The patient agreed with the plan and demonstrated an understanding of the instructions.  The patient was advised to call back if the symptoms worsen or if the condition fails to improve as anticipated.  I provided 22 minutes of face-to-face time during this this encounter and > 50% was spent counseling as documented under my assessment and plan.  An additional 5-10 minutes were spent reviewing his chart (Epic and Care Everywhere) including notes, labs, and imaging studies.    Melissa C. Mike Gip, MD, PhD    12/20/2020, 8:40 AM   I, Mirian Mo Tufford, am acting as Education administrator for [employer]. Mike Gip, MD, PhD.  I, Melissa C. Mike Gip, MD, have reviewed the above documentation for accuracy and completeness, and I agree with the above.

## 2020-12-19 NOTE — Progress Notes (Signed)
[place]  [phone] [phone]   Name: Sean Garner. Date: 12/19/2020 MRN: [id number]  DOB: 01/08/1946  Patient Care Team: Derinda Late, MD as PCP - General (Family Medicine) Lequita Asal, MD as Referring Physician (Hematology and Oncology) Beverly Gust, MD (Otolaryngology) Pyrtle, Lajuan Lines, MD as Consulting Physician (Gastroenterology) Clapacs, Madie Reno, MD (Psychiatry)    REASON FOR CONSULTATION: [name]. is a 75 y.o. 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  Patient has severe claustrophobia limiting work-up as he refused PET/CT.  Patient has had severe anxiety and was referred to palliative care to help address goals and manage ongoing symptoms.   SOCIAL HISTORY:     reports that he quit smoking about 36 years ago. He has never used smokeless tobacco. He reports previous alcohol use. He reports that he does not use drugs.  Patient is married and lives at home with his wife.  He has a daughter who lives nearby.  Patient is a [place] veteran and has PTSD.  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  ADVANCE DIRECTIVES:  Not on file  CODE STATUS:   PAST MEDICAL HISTORY: Past Medical History:  Diagnosis Date  . Anxiety   . Cancer [facility])    Head/throat Cancer at the base of the tongue  . Cataract    lt eye repair; present in rt eye but not ripe yet.  . Claustrophobia   . Diabetes mellitus without complication ([place])   . GERD (gastroesophageal reflux disease)   . Glaucoma    using gtts for this.  . Hypercholesterolemia   . Hypertension     PAST SURGICAL HISTORY:  Past Surgical History:  Procedure Laterality Date  . CATARACT EXTRACTION W/PHACO Left 12/03/2017   Procedure: CATARACT EXTRACTION PHACO AND INTRAOCULAR LENS  PLACEMENT ([place]) COMPLICATED DIABETIC LEFT;  Surgeon: Leandrew Koyanagi, MD;  Location: [facility];  Service: Ophthalmology;  Laterality: Left;  Diabetic - oral meds  . CHOLECYSTECTOMY    . COLONOSCOPY  2016  . PORTA CATH INSERTION N/A 11/13/2020   Procedure: PORTA CATH INSERTION;  Surgeon: Algernon Huxley, MD;  Location: [facility];  Service: Cardiovascular;  Laterality: N/A;    HEMATOLOGY/ONCOLOGY HISTORY:  Oncology History  Carcinoma of base of tongue ([place])  10/17/2020 Initial Diagnosis   Carcinoma of base of tongue ([place])   11/20/2020 -  Chemotherapy    Patient is on Treatment Plan: HEAD/NECK CISPLATIN Q7D        ALLERGIES:  has No Known Allergies.  MEDICATIONS:  Current Outpatient Medications  Medication Sig Dispense Refill  . ALPRAZolam (XANAX) 0.25 MG tablet Take 1 tablet (0.25 mg total) by mouth at bedtime as needed for anxiety. (Patient not taking: Reported on 12/13/2020) 60 tablet 0  . ALPRAZolam (XANAX) 0.5 MG tablet Take 1 tablet (0.5 mg total) by mouth 3 (three) times daily as needed for anxiety. (Patient not taking: No sig reported) 45 tablet 0  . brimonidine-timolol (COMBIGAN) 0.2-0.5 % ophthalmic solution Place 1 drop into the left eye daily.    . calcium carbonate (OSCAL) 1500 (600 Ca) MG TABS tablet Take by mouth 2 (two) times daily with a meal.    . citalopram (CELEXA) 20 MG tablet Take 20 mg by mouth daily.    Marland Kitchen dexamethasone (DECADRON) 4 MG  tablet Take 2 tablets (8 mg total) by mouth daily. Take daily x 3 days starting the day after cisplatin chemotherapy. Take with food. (Patient not taking: No sig reported) 30 tablet 1  . esomeprazole (NEXIUM) 20 MG capsule Take 40 mg by mouth daily at 12 noon.    . fluticasone (FLONASE) 50 MCG/ACT nasal spray Place into both nostrils.    Marland Kitchen glipiZIDE (GLUCOTROL XL) 5 MG 24 hr tablet Take 5 mg by mouth 2 (two) times daily.  1  . glucose blood test strip CHECK FASTING BLOOD SUGAR EVERY MORNING. (NEED SIGNATURE)     . lidocaine-prilocaine (EMLA) cream Apply to affected area once 30 g 3  . losartan (COZAAR) 100 MG tablet Take 100 mg by mouth daily.    . metFORMIN (GLUCOPHAGE-XR) 500 MG 24 hr tablet Take 500 mg by mouth 2 (two) times daily. Takes 2 tablets and 1 tablet in the pm    . ondansetron (ZOFRAN) 8 MG tablet Take 1 tablet (8 mg total) by mouth 2 (two) times daily as needed. Start on the third day after cisplatin chemotherapy. (Patient not taking: No sig reported) 30 tablet 1  . prochlorperazine (COMPAZINE) 10 MG tablet Take 1 tablet (10 mg total) by mouth every 6 (six) hours as needed (Nausea or vomiting). (Patient not taking: No sig reported) 30 tablet 1  . simvastatin (ZOCOR) 40 MG tablet Take by mouth.    . sucralfate (CARAFATE) 1 g tablet Take 0.5 tablets (0.5 g total) by mouth 3 (three) times daily. Dissolve in warm water, swish and swallow 60 tablet 1  . traZODone (DESYREL) 50 MG tablet Take 0.5-1 tablets (25-50 mg total) by mouth at bedtime as needed for sleep. 30 tablet 2  . VITAMIN D PO Take 800 Units by mouth daily.     Current Facility-Administered Medications  Medication Dose Route Frequency Provider Last Rate Last Admin  . 0.9 %  sodium chloride infusion  500 mL Intravenous Once Pyrtle, Lajuan Lines, MD       Facility-Administered Medications Ordered in Other Visits  Medication Dose Route Frequency Provider Last Rate Last Admin  . 0.9 % NaCl with KCl 20 mEq/ L  infusion   Intravenous Once Corcoran, Melissa C, MD      . CISplatin (PLATINOL) 87 mg in sodium chloride 0.9 % 250 mL chemo infusion  40 mg/m2 (Order-Specific) Intravenous Once Nolon Stalls C, MD      . dexamethasone (DECADRON) 10 mg in sodium chloride 0.9 % 50 mL IVPB  10 mg Intravenous Once Corcoran, Melissa C, MD      . fosaprepitant (EMEND) 150 mg in sodium chloride 0.9 % 145 mL IVPB  150 mg Intravenous Once Corcoran, Melissa C, MD      . magnesium sulfate IVPB 2 g 50 mL  2 g Intravenous Once Lequita Asal, MD         VITAL SIGNS: There were no vitals taken for this visit. There were no vitals filed for this visit.  Estimated body mass index is 28.88 kg/m as calculated from the following:   Height as of 11/13/20: 5' 10.5" (1.791 m).   Weight as of 12/13/20: 204 lb 2.3 oz (92.6 kg).  LABS: CBC:    Component Value Date/Time   WBC 6.8 12/13/2020 0906   HGB 12.7 (L) 12/13/2020 0906   HGB 12.3 (L) 10/01/2013 0508   HCT 36.8 (L) 12/13/2020 0906   HCT 35.7 (L) 10/01/2013 0508   PLT 193 12/13/2020 0906  PLT 302 10/01/2013 0508   MCV 86.4 12/13/2020 0906   MCV 89 10/01/2013 0508   NEUTROABS 5.7 12/13/2020 0906   NEUTROABS 9.8 (H) 10/01/2013 0508   LYMPHSABS 0.6 (L) 12/13/2020 0906   LYMPHSABS 1.6 10/01/2013 0508   MONOABS 0.5 12/13/2020 0906   MONOABS 0.9 10/01/2013 0508   EOSABS 0.0 12/13/2020 0906   EOSABS 0.1 10/01/2013 0508   BASOSABS 0.0 12/13/2020 0906   BASOSABS 0.1 10/01/2013 0508   Comprehensive Metabolic Panel:    Component Value Date/Time   NA 133 (L) 12/13/2020 0906   NA 135 (L) 09/29/2013 0534   K 4.1 12/13/2020 0906   K 3.8 09/29/2013 0534   CL 96 (L) 12/13/2020 0906   CL 101 09/29/2013 0534   CO2 27 12/13/2020 0906   CO2 29 09/29/2013 0534   BUN 18 12/13/2020 0906   BUN 12 09/29/2013 0534   CREATININE 0.93 12/13/2020 0906   CREATININE 1.15 09/29/2013 0534   GLUCOSE 207 (H) 12/13/2020 0906   GLUCOSE 258 (H) 09/29/2013 0534   CALCIUM 9.2 12/13/2020 0906   CALCIUM 8.7 09/29/2013 0534   AST 16 12/13/2020 0906   AST 19 10/01/2013 0508   ALT 19 12/13/2020 0906   ALT 29 10/01/2013 0508   ALKPHOS 49 12/13/2020 0906   ALKPHOS 76 10/01/2013 0508   BILITOT 0.6 12/13/2020 0906   BILITOT 0.6 10/01/2013 0508   PROT 6.7 12/13/2020 0906   PROT 5.7 (L) 10/01/2013 0508   ALBUMIN 3.6 12/13/2020 0906   ALBUMIN 2.4 (L) 10/01/2013 0508    RADIOGRAPHIC STUDIES: No results found.  PERFORMANCE STATUS (ECOG) : 0 - Asymptomatic  Review of Systems Unless otherwise noted, a  complete review of systems is negative.  Physical Exam General: NAD Pulmonary: Unlabored Extremities: no edema, no joint deformities Skin: no rashes Neurological: Grossly nonfocal  IMPRESSION: Routine follow-up visit.  Patient was seen after XRT.  Patient much calmer during today's visit.  He reports that anxiety is significantly improved over the past several weeks.  He has been getting more sleep at night but finds that he wakes in the early morning around 4 or 5:00 and at times cannot return to sleep.  He says the trazodone does not leave him feeling groggy or "drugged" in the a.m. hours like the diphenhydramine did.  Patient does request a refill of the trazodone and I will send it to pharmacy.  I spoke with patient's wife by phone.  She also felt the patient's depression, anxiety, and insomnia are all greatly improved.  She did not increase the citalopram to 40 mg as per the plan during the last clinic visit.  Patient is still taking 20 mg daily.  Patient continues to take alprazolam 0.25 mg once daily prior to XRT treatments.  However, patient endorses markedly less anxiety through XRT.  He states that he has been able to rest and relax during treatment.  Both patient and wife are still in agreement with reestablishing psychiatric care and patient is pending psychiatry consult in 2 weeks.  We will leave the current regimen as is until patient is seen by psychiatry.  PLAN: -Continue current scope of treatment -Continue citalopram to 20 mg daily -Continue alprazolam 0.25 mg 3 times daily as needed -Continue trazodone 25 to 50 mg nightly as needed -Pending referral to psychiatry -Follow-up MyChart visit 1 month  Patient expressed understanding and was in agreement with this plan. He also understands that He can call the clinic at any time with any questions,  concerns, or complaints.     Time Total: 15 minutes  Visit consisted of counseling and education dealing with the complex  and emotionally intense issues of symptom management and palliative care in the setting of serious and potentially life-threatening illness.Greater than 50%  of this time was spent counseling and coordinating care related to the above assessment and plan.  Signed by: Altha Harm, PhD, NP-C

## 2020-12-20 ENCOUNTER — Ambulatory Visit
Admission: RE | Admit: 2020-12-20 | Discharge: 2020-12-20 | Disposition: A | Discharge status: Home / Self Care | Payer: No Typology Code available for payment source | Source: Ambulatory Visit | Attending: Radiation Oncology | Admitting: Radiation Oncology

## 2020-12-20 ENCOUNTER — Inpatient Hospital Stay: Payer: No Typology Code available for payment source | Attending: Hematology and Oncology

## 2020-12-20 ENCOUNTER — Inpatient Hospital Stay: Payer: No Typology Code available for payment source

## 2020-12-20 ENCOUNTER — Other Ambulatory Visit: Payer: Self-pay

## 2020-12-20 ENCOUNTER — Inpatient Hospital Stay (HOSPITAL_BASED_OUTPATIENT_CLINIC_OR_DEPARTMENT_OTHER): Payer: No Typology Code available for payment source | Admitting: Hematology and Oncology

## 2020-12-20 ENCOUNTER — Encounter: Payer: Self-pay | Admitting: Hematology and Oncology

## 2020-12-20 VITALS — BP 113/56 | HR 58 | Temp 97.0°F | Resp 18

## 2020-12-20 VITALS — BP 113/56 | HR 58 | Temp 97.0°F | Wt 196.7 lb

## 2020-12-20 DIAGNOSIS — Z5111 Encounter for antineoplastic chemotherapy: Secondary | ICD-10-CM | POA: Insufficient documentation

## 2020-12-20 DIAGNOSIS — K1233 Oral mucositis (ulcerative) due to radiation: Secondary | ICD-10-CM | POA: Diagnosis not present

## 2020-12-20 DIAGNOSIS — C159 Malignant neoplasm of esophagus, unspecified: Secondary | ICD-10-CM

## 2020-12-20 DIAGNOSIS — C01 Malignant neoplasm of base of tongue: Secondary | ICD-10-CM

## 2020-12-20 DIAGNOSIS — E871 Hypo-osmolality and hyponatremia: Secondary | ICD-10-CM | POA: Insufficient documentation

## 2020-12-20 DIAGNOSIS — C770 Secondary and unspecified malignant neoplasm of lymph nodes of head, face and neck: Secondary | ICD-10-CM | POA: Insufficient documentation

## 2020-12-20 DIAGNOSIS — Z79899 Other long term (current) drug therapy: Secondary | ICD-10-CM | POA: Insufficient documentation

## 2020-12-20 DIAGNOSIS — Z87891 Personal history of nicotine dependence: Secondary | ICD-10-CM | POA: Insufficient documentation

## 2020-12-20 DIAGNOSIS — C160 Malignant neoplasm of cardia: Secondary | ICD-10-CM | POA: Insufficient documentation

## 2020-12-20 DIAGNOSIS — Z510 Encounter for antineoplastic radiation therapy: Secondary | ICD-10-CM | POA: Diagnosis not present

## 2020-12-20 LAB — CBC WITH DIFFERENTIAL/PLATELET
Abs Immature Granulocytes: 0.11 10*3/uL — ABNORMAL HIGH (ref 0.00–0.07)
Basophils Absolute: 0 10*3/uL (ref 0.0–0.1)
Basophils Relative: 0 %
Eosinophils Absolute: 0 10*3/uL (ref 0.0–0.5)
Eosinophils Relative: 0 %
HCT: 37.2 % — ABNORMAL LOW (ref 39.0–52.0)
Hemoglobin: 12.7 g/dL — ABNORMAL LOW (ref 13.0–17.0)
Immature Granulocytes: 2 %
Lymphocytes Relative: 9 %
Lymphs Abs: 0.7 10*3/uL (ref 0.7–4.0)
MCH: 29.7 pg (ref 26.0–34.0)
MCHC: 34.1 g/dL (ref 30.0–36.0)
MCV: 86.9 fL (ref 80.0–100.0)
Monocytes Absolute: 0.6 10*3/uL (ref 0.1–1.0)
Monocytes Relative: 8 %
Neutro Abs: 6.2 10*3/uL (ref 1.7–7.7)
Neutrophils Relative %: 81 %
Platelets: 206 10*3/uL (ref 150–400)
RBC: 4.28 MIL/uL (ref 4.22–5.81)
RDW: 12.9 % (ref 11.5–15.5)
WBC: 7.6 10*3/uL (ref 4.0–10.5)
nRBC: 0 % (ref 0.0–0.2)

## 2020-12-20 LAB — MAGNESIUM: Magnesium: 1.6 mg/dL — ABNORMAL LOW (ref 1.7–2.4)

## 2020-12-20 LAB — COMPREHENSIVE METABOLIC PANEL
ALT: 23 U/L (ref 0–44)
AST: 18 U/L (ref 15–41)
Albumin: 3.8 g/dL (ref 3.5–5.0)
Alkaline Phosphatase: 49 U/L (ref 38–126)
Anion gap: 14 (ref 5–15)
BUN: 20 mg/dL (ref 8–23)
CO2: 23 mmol/L (ref 22–32)
Calcium: 8.9 mg/dL (ref 8.9–10.3)
Chloride: 93 mmol/L — ABNORMAL LOW (ref 98–111)
Creatinine, Ser: 0.9 mg/dL (ref 0.61–1.24)
GFR, Estimated: >60 mL/min (ref >60)
Glucose, Bld: 169 mg/dL — ABNORMAL HIGH (ref 70–99)
Potassium: 4 mmol/L (ref 3.5–5.1)
Sodium: 130 mmol/L — ABNORMAL LOW (ref 135–145)
Total Bilirubin: 0.8 mg/dL (ref 0.3–1.2)
Total Protein: 6.8 g/dL (ref 6.5–8.1)

## 2020-12-20 MED ORDER — HEPARIN SOD (PORK) LOCK FLUSH 100 UNIT/ML IV SOLN
500.0000 [IU] | Freq: Once | INTRAVENOUS | Status: AC | PRN
Start: 2020-12-20 — End: 2020-12-20
  Administered 2020-12-20: 500 [IU]
  Filled 2020-12-20: qty 5

## 2020-12-20 MED ORDER — POTASSIUM CHLORIDE IN NACL 20-0.9 MEQ/L-% IV SOLN
Freq: Once | INTRAVENOUS | Status: AC
  Filled 2020-12-20: qty 1000

## 2020-12-20 MED ORDER — MAGNESIUM SULFATE 2 GM/50ML IV SOLN
2.0000 g | Freq: Once | INTRAVENOUS | Status: AC
  Administered 2020-12-20: 2 g via INTRAVENOUS
  Filled 2020-12-20: qty 50

## 2020-12-20 MED ORDER — SODIUM CHLORIDE 0.9 % IV SOLN
10.0000 mg | Freq: Once | INTRAVENOUS | Status: AC
  Administered 2020-12-20: 10 mg via INTRAVENOUS
  Filled 2020-12-20: qty 10

## 2020-12-20 MED ORDER — CISPLATIN CHEMO INJECTION 100MG/100ML
40.0000 mg/m2 | Freq: Once | INTRAVENOUS | Status: AC
  Administered 2020-12-20: 87 mg via INTRAVENOUS
  Filled 2020-12-20: qty 87

## 2020-12-20 MED ORDER — PALONOSETRON HCL INJECTION 0.25 MG/5ML
0.2500 mg | Freq: Once | INTRAVENOUS | Status: AC
  Administered 2020-12-20: 0.25 mg via INTRAVENOUS
  Filled 2020-12-20: qty 5

## 2020-12-20 MED ORDER — SODIUM CHLORIDE 0.9 % IV SOLN
1.0000 g | Freq: Once | INTRAVENOUS | Status: AC
  Administered 2020-12-20: 1 g via INTRAVENOUS
  Filled 2020-12-20: qty 2

## 2020-12-20 MED ORDER — SODIUM CHLORIDE 0.9 % IV SOLN: Freq: Once | INTRAVENOUS | Status: DC

## 2020-12-20 MED ORDER — HEPARIN SOD (PORK) LOCK FLUSH 100 UNIT/ML IV SOLN
INTRAVENOUS | Status: AC
  Filled 2020-12-20: qty 5

## 2020-12-20 MED ORDER — SODIUM CHLORIDE 0.9 % IV SOLN
Freq: Once | INTRAVENOUS | Status: AC
  Filled 2020-12-20: qty 250

## 2020-12-20 MED ORDER — SODIUM CHLORIDE 0.9 % IV SOLN
150.0000 mg | Freq: Once | INTRAVENOUS | Status: AC
  Administered 2020-12-20: 150 mg via INTRAVENOUS
  Filled 2020-12-20: qty 5

## 2020-12-20 NOTE — Progress Notes (Signed)
Patient stated that his appetite has declined in the last week, that he is having trouble swallowing and eating foods. He is loosing his taste, he stated that his throat has been sore from the radiations

## 2020-12-20 NOTE — Progress Notes (Signed)
Patient stable at discharge.

## 2020-12-21 ENCOUNTER — Ambulatory Visit
Admission: RE | Admit: 2020-12-21 | Discharge: 2020-12-21 | Disposition: A | Discharge status: Home / Self Care | Payer: No Typology Code available for payment source | Source: Ambulatory Visit | Attending: Radiation Oncology | Admitting: Radiation Oncology

## 2020-12-21 DIAGNOSIS — Z510 Encounter for antineoplastic radiation therapy: Secondary | ICD-10-CM | POA: Diagnosis not present

## 2020-12-22 ENCOUNTER — Inpatient Hospital Stay: Payer: No Typology Code available for payment source

## 2020-12-22 ENCOUNTER — Ambulatory Visit
Admission: RE | Admit: 2020-12-22 | Discharge: 2020-12-22 | Disposition: A | Discharge status: Home / Self Care | Payer: No Typology Code available for payment source | Source: Ambulatory Visit | Attending: Radiation Oncology | Admitting: Radiation Oncology

## 2020-12-22 DIAGNOSIS — Z510 Encounter for antineoplastic radiation therapy: Secondary | ICD-10-CM | POA: Diagnosis not present

## 2020-12-25 ENCOUNTER — Ambulatory Visit: Payer: Medicare Other

## 2020-12-25 ENCOUNTER — Other Ambulatory Visit: Payer: Medicare Other

## 2020-12-25 ENCOUNTER — Ambulatory Visit: Payer: No Typology Code available for payment source

## 2020-12-25 ENCOUNTER — Inpatient Hospital Stay: Payer: Medicare Other

## 2020-12-25 ENCOUNTER — Ambulatory Visit: Payer: Medicare Other | Admitting: Hematology and Oncology

## 2020-12-25 NOTE — Progress Notes (Signed)
[facility]  [address], [address] [place], [place] [address] Phone: [phone]  Fax: [phone]   Clinic Day:  12/27/2020  Referring physician: Derinda Late, MD  Chief Complaint: Sean Bridgeman. is a 75 y.o. 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  HPI:  The patient was last seen in the medical oncology clinic on 12/20/2020. At that time,  he felt "pretty good." He had a sore throat from radiation; everything tasted the same.  Exam revealed early mucosal changes due to radiation.  Right neck mass was 2 x 1.3 cm. Hematocrit was 37.2, hemoglobin 12.7, platelets 206,000, WBC 7,600. Sodium was 130. Glucose was 169. Magnesium was 1.6. He received week #5 cisplatin. He received 1 gm IV magnesium.  The patient is currently receiving IMRT treatment to the tongue. He has received 27 fractions thus far. Plan is for 70 Gy in 35 fractions.  The patient spoke to Jennet Maduro, RD by phone on 12/25/2020. The patient had not gone to radiation that morning as he was not feeling well. He reported painful swallowing and no taste. She recommended switching from Glucerna to Ensure Complete or Boost Plus. They discussed checking blood sugar regularly and adjusting diabetes medications. They discussed blending foods and how to add calories and protein to foods.  He was seen in the symptom management clinic by Beckey Rutter on 12/26/2020 secondary to oral pain due to radiation induced mucositis and esophagitis.  His wife had called EMS the day prior due to weakness.  He declined transport to the ER. Sodium was 130.  He received 1 L of IV fluids.  Education was provided regarding hygiene and management of mouth.  He was to continue Carafate.  A prescription was sent in for Magic mouthwash.  During the interim, he has been "pretty good."  He feels a bit "washed out."  It is getting a bit more difficult to swallow. He is not eating well. He declines daily fluids. He denies nausea, vomiting, dizziness, and lightheadedness. His hearing is "fine."  His mood is "very good."   Past Medical History:  Diagnosis Date  . Anxiety   . Cancer [facility])    Head/throat Cancer at the base of the tongue  . Cataract    lt eye repair; present in rt eye but not ripe yet.  . Claustrophobia   . Diabetes mellitus without complication ([place])   . GERD (gastroesophageal reflux disease)   . Glaucoma    using gtts for this.  . Hypercholesterolemia   . Hypertension     Past Surgical History:  Procedure Laterality Date  . CATARACT EXTRACTION W/PHACO Left 12/03/2017   Procedure: CATARACT EXTRACTION PHACO AND INTRAOCULAR LENS PLACEMENT ([place]) COMPLICATED DIABETIC LEFT;  Surgeon: Leandrew Koyanagi, MD;  Location: [facility];  Service: Ophthalmology;  Laterality: Left;  Diabetic - oral meds  . CHOLECYSTECTOMY    . COLONOSCOPY  2016  . PORTA CATH INSERTION N/A 11/13/2020   Procedure: PORTA CATH INSERTION;  Surgeon: Algernon Huxley, MD;  Location: [facility];  Service: Cardiovascular;  Laterality: N/A;    Family History  Problem Relation Age of Onset  . Cancer Mother   . Colon cancer Neg Hx   . Colon polyps Neg Hx   . Esophageal cancer Neg Hx   . Rectal cancer Neg Hx   .  Stomach cancer Neg Hx     Social History:  reports that he quit smoking about 36 years ago. He has never used smokeless tobacco. He reports previous alcohol use. He reports that he does not use drugs. He quit smoking cold [place] in 1986. He smoked 2 packs per day for 20 years. He drinks a glass of wine or a Margarita occasionally. 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 He was exposed to [employer]. He worked with a Geologist, engineering for 34 years. The patient is accompanied by his wife today.  Allergies: No Known Allergies  Current  Medications: Current Outpatient Medications  Medication Sig Dispense Refill  . ALPRAZolam (XANAX) 0.25 MG tablet Take 1 tablet (0.25 mg total) by mouth at bedtime as needed for anxiety. 60 tablet 0  . brimonidine-timolol (COMBIGAN) 0.2-0.5 % ophthalmic solution Place 1 drop into the left eye daily.    . calcium carbonate (OSCAL) 1500 (600 Ca) MG TABS tablet Take by mouth 2 (two) times daily with a meal.    . citalopram (CELEXA) 20 MG tablet Take 20 mg by mouth daily.    Marland Kitchen dexamethasone (DECADRON) 4 MG tablet Take 2 tablets (8 mg total) by mouth daily. Take daily x 3 days starting the day after cisplatin chemotherapy. Take with food. 30 tablet 1  . esomeprazole (NEXIUM) 20 MG capsule Take 40 mg by mouth daily at 12 noon.    . fluticasone (FLONASE) 50 MCG/ACT nasal spray Place into both nostrils.    Marland Kitchen glipiZIDE (GLUCOTROL XL) 5 MG 24 hr tablet Take 5 mg by mouth 2 (two) times daily.  1  . glucose blood test strip CHECK FASTING BLOOD SUGAR EVERY MORNING. (NEED SIGNATURE)    . lidocaine-prilocaine (EMLA) cream Apply to affected area once 30 g 3  . losartan (COZAAR) 100 MG tablet Take 100 mg by mouth daily.    . metFORMIN (GLUCOPHAGE-XR) 500 MG 24 hr tablet Take 500 mg by mouth 2 (two) times daily. Takes 2 tablets and 1 tablet in the pm    . simvastatin (ZOCOR) 40 MG tablet Take by mouth.    . sucralfate (CARAFATE) 1 g tablet Take 0.5 tablets (0.5 g total) by mouth 3 (three) times daily. Dissolve in warm water, swish and swallow 60 tablet 1  . traZODone (DESYREL) 50 MG tablet Take 0.5-1 tablets (25-50 mg total) by mouth at bedtime as needed for sleep. 30 tablet 2  . VITAMIN D PO Take 800 Units by mouth daily.    Marland Kitchen ALPRAZolam (XANAX) 0.5 MG tablet Take 1 tablet (0.5 mg total) by mouth 3 (three) times daily as needed for anxiety. (Patient not taking: No sig reported) 45 tablet 0  . ondansetron (ZOFRAN) 8 MG tablet Take 1 tablet (8 mg total) by mouth 2 (two) times daily as needed. Start on the third day  after cisplatin chemotherapy. (Patient not taking: No sig reported) 30 tablet 1  . prochlorperazine (COMPAZINE) 10 MG tablet Take 1 tablet (10 mg total) by mouth every 6 (six) hours as needed (Nausea or vomiting). (Patient not taking: No sig reported) 30 tablet 1   Current Facility-Administered Medications  Medication Dose Route Frequency Provider Last Rate Last Admin  . 0.9 %  sodium chloride infusion  500 mL Intravenous Once Pyrtle, Lajuan Lines, MD       Facility-Administered Medications Ordered in Other Visits  Medication Dose Route Frequency Provider Last Rate Last Admin  . 0.9 % NaCl with KCl 20 mEq/ L  infusion   Intravenous Once Lequita Asal, MD      . CISplatin (PLATINOL) 87 mg in sodium chloride 0.9 % 250 mL chemo infusion  40 mg/m2 (Order-Specific) Intravenous Once Lequita Asal, MD      . dexamethasone (DECADRON) 10 mg in sodium chloride 0.9 % 50 mL IVPB  10 mg Intravenous Once Jayveion Stalling C, MD      . fosaprepitant (EMEND) 150 mg in sodium chloride 0.9 % 145 mL IVPB  150 mg Intravenous Once Ashyia Schraeder C, MD      . magnesium sulfate IVPB 2 g 50 mL  2 g Intravenous Once Lequita Asal, MD        Review of Systems  Constitutional: Negative for chills, diaphoresis, fever, malaise/fatigue and weight loss (stable).       Feels "pretty good" but "washed out".  HENT: Positive for hearing loss and sore throat. Negative for congestion, ear discharge, ear pain, nosebleeds, sinus pain and tinnitus.   Eyes: Negative for blurred vision.  Respiratory: Negative for cough, hemoptysis, sputum production and shortness of breath.   Cardiovascular: Negative for chest pain, palpitations and leg swelling.  Gastrointestinal: Negative for abdominal pain, blood in stool, constipation, diarrhea, heartburn, melena, nausea and vomiting.       Eating softer foods. Drinks 2-3 Glucernas daily.  Genitourinary: Negative for dysuria, frequency, hematuria and urgency.  Musculoskeletal:  Negative for back pain, joint pain, myalgias and neck pain.  Skin: Negative for itching and rash.  Neurological: Negative for dizziness, tingling, sensory change, weakness and headaches.  Endo/Heme/Allergies: Does not bruise/bleed easily.  Psychiatric/Behavioral: Negative for depression (mood is "very good") and memory loss. The patient is not nervous/anxious (on citalopram and Xanax) and does not have insomnia.   All other systems reviewed and are negative.  Performance status (ECOG): 1  Vitals Blood pressure 128/65, pulse 72, temperature (!) 96 F (35.6 C), temperature source Tympanic, resp. rate 16, weight 196 lb 1.6 oz (89 kg), SpO2 100 %.   Physical Exam Vitals and nursing note reviewed.  Constitutional:      General: He is not in acute distress.    Appearance: He is not diaphoretic.  HENT:     Head: Normocephalic and atraumatic.     Comments: Gray hair.    Mouth/Throat:     Mouth: Mucous membranes are moist.  Eyes:     General: No scleral icterus.    Extraocular Movements: Extraocular movements intact.     Conjunctiva/sclera: Conjunctivae normal.     Pupils: Pupils are equal, round, and reactive to light.     Comments: Blue eyes.  Neck:     Comments: 1.8 x 1.3 cm right neck mass (previously 2.0 x 1.3 cm). Cardiovascular:     Rate and Rhythm: Normal rate and regular rhythm.     Heart sounds: Normal heart sounds. No murmur heard.   Pulmonary:     Effort: Pulmonary effort is normal. No respiratory distress.     Breath sounds: Normal breath sounds. No wheezing or rales.  Chest:     Chest wall: No tenderness.  Breasts:     Right: No axillary adenopathy or supraclavicular adenopathy.     Left: No axillary adenopathy or supraclavicular adenopathy.    Abdominal:     General: Bowel sounds are normal. There is no distension.     Palpations: Abdomen is soft. There is no mass.     Tenderness: There is no abdominal tenderness. There is no guarding or rebound.  Musculoskeletal:        General: No swelling or tenderness. Normal range of motion.     Cervical back: Normal range of motion and neck supple.  Lymphadenopathy:     Head:     Right side of head: No preauricular, posterior auricular or occipital adenopathy.     Left side of head: No preauricular, posterior auricular or occipital adenopathy.     Upper Body:     Right upper body: No supraclavicular or axillary adenopathy.     Left upper body: No supraclavicular or axillary adenopathy.     Lower Body: No right inguinal adenopathy. No left inguinal adenopathy.  Skin:    General: Skin is warm and dry.     Comments: Radiation changes to the neck.  Neurological:     Mental Status: He is alert and oriented to person, place, and time.  Psychiatric:        Behavior: Behavior normal.        Thought Content: Thought content normal.        Judgment: Judgment normal.    Infusion on 12/27/2020  Component Date Value Ref Range Status  . WBC 12/27/2020 6.9  4.0 - 10.5 K/uL Final  . RBC 12/27/2020 3.93* 4.22 - 5.81 MIL/uL Final  . Hemoglobin 12/27/2020 11.9* 13.0 - 17.0 g/dL Final  . HCT 12/27/2020 33.9* 39.0 - 52.0 % Final  . MCV 12/27/2020 86.3  80.0 - 100.0 fL Final  . MCH 12/27/2020 30.3  26.0 - 34.0 pg Final  . MCHC 12/27/2020 35.1  30.0 - 36.0 g/dL Final  . RDW 12/27/2020 13.3  11.5 - 15.5 % Final  . Platelets 12/27/2020 113* 150 - 400 K/uL Final   Comment: Immature Platelet Fraction may be clinically indicated, consider ordering this additional test [id number]   . nRBC 12/27/2020 0.0  0.0 - 0.2 % Final  . Neutrophils Relative % 12/27/2020 87  % Final  . Neutro Abs 12/27/2020 6.1  1.7 - 7.7 K/uL Final  . Lymphocytes Relative 12/27/2020 6  % Final  . Lymphs Abs 12/27/2020 0.4* 0.7 - 4.0 K/uL Final  . Monocytes Relative 12/27/2020 6  % Final  . Monocytes Absolute 12/27/2020 0.4  0.1 - 1.0 K/uL Final  . Eosinophils Relative 12/27/2020 0  % Final  . Eosinophils Absolute 12/27/2020 0.0   0.0 - 0.5 K/uL Final  . Basophils Relative 12/27/2020 0  % Final  . Basophils Absolute 12/27/2020 0.0  0.0 - 0.1 K/uL Final  . Immature Granulocytes 12/27/2020 1  % Final  . Abs Immature Granulocytes 12/27/2020 0.04  0.00 - 0.07 K/uL Final   Performed at [facility], [address], [place], [place] [address]  Infusion on 12/26/2020  Component Date Value Ref Range Status  . Sodium 12/26/2020 130* 135 - 145 mmol/L Final  . Potassium 12/26/2020 3.7  3.5 - 5.1 mmol/L Final  . Chloride 12/26/2020 96* 98 - 111 mmol/L Final  . CO2 12/26/2020 24  22 - 32 mmol/L Final  . Glucose, Bld 12/26/2020 178* 70 - 99 mg/dL Final   Glucose reference range applies only to samples taken after fasting for at least 8 hours.  . BUN 12/26/2020 23  8 - 23 mg/dL Final  . Creatinine, Ser 12/26/2020 0.91  0.61 - 1.24 mg/dL Final  . Calcium 12/26/2020 8.9  8.9 - 10.3 mg/dL Final  . Total Protein 12/26/2020 6.6  6.5 - 8.1 g/dL Final  . Albumin 12/26/2020 3.7  3.5 - 5.0 g/dL  Final  . AST 12/26/2020 20  15 - 41 U/L Final  . ALT 12/26/2020 25  0 - 44 U/L Final  . Alkaline Phosphatase 12/26/2020 50  38 - 126 U/L Final  . Total Bilirubin 12/26/2020 1.1  0.3 - 1.2 mg/dL Final  . GFR, Estimated 12/26/2020 >60  >60 mL/min Final   Comment: (NOTE) Calculated using the CKD-EPI Creatinine Equation (2021)   . Anion gap 12/26/2020 10  5 - 15 Final   Performed at [facility], [address] [place], [place] [address]  . WBC 12/26/2020 8.6  4.0 - 10.5 K/uL Final  . RBC 12/26/2020 4.21* 4.22 - 5.81 MIL/uL Final  . Hemoglobin 12/26/2020 12.7* 13.0 - 17.0 g/dL Final  . HCT 12/26/2020 36.0* 39.0 - 52.0 % Final  . MCV 12/26/2020 85.5  80.0 - 100.0 fL Final  . MCH 12/26/2020 30.2  26.0 - 34.0 pg Final  . MCHC 12/26/2020 35.3  30.0 - 36.0 g/dL Final  . RDW 12/26/2020 13.2  11.5 - 15.5 % Final  . Platelets 12/26/2020 141* 150 - 400 K/uL Final  . nRBC 12/26/2020 0.0  0.0 - 0.2 % Final  . Neutrophils Relative %  12/26/2020 86  % Final  . Neutro Abs 12/26/2020 7.4  1.7 - 7.7 K/uL Final  . Lymphocytes Relative 12/26/2020 7  % Final  . Lymphs Abs 12/26/2020 0.6* 0.7 - 4.0 K/uL Final  . Monocytes Relative 12/26/2020 6  % Final  . Monocytes Absolute 12/26/2020 0.5  0.1 - 1.0 K/uL Final  . Eosinophils Relative 12/26/2020 0  % Final  . Eosinophils Absolute 12/26/2020 0.0  0.0 - 0.5 K/uL Final  . Basophils Relative 12/26/2020 0  % Final  . Basophils Absolute 12/26/2020 0.0  0.0 - 0.1 K/uL Final  . Immature Granulocytes 12/26/2020 1  % Final  . Abs Immature Granulocytes 12/26/2020 0.04  0.00 - 0.07 K/uL Final   Performed at [facility], [address] [place], [place] [address]    Assessment:  Sean Watkin. is a 75 y.o. 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  He presented with right neck adenopathy.    Neck soft tissue CT on 09/28/2020 revealed a 3.5 x 3.0 x 4.8 cm right neck mass most c/w enlarged lymph node. There was a 14 mm enhancing mass in the right base of tongue.   PET scan on 10/25/2020 revealed right tongue base primary with ipsilateral level 2 nodal metastasis. There were no findings of extracervical metastatic disease. There was focal hypermetabolism in the distal esophagus, with possible concurrent soft tissue density lesion. Recommended correlation with endoscopy. There was vague right lower lobe low-level hypermetabolism and ground-glass nodularity, favored minimal infection or aspiration. Incidental findings included aortic atherosclerosis, coronary artery atherosclerosis, emphysema, a tiny hiatal hernia, and prostatomegaly.  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  Speech discrimination scores were 100% in each ear.  He is  s/p week #5 cisplatin and concurrent radiation (11/20/2020 - 002/12/2020).  He is tolerating treatment well.  He has radiation induced oral changes affecting taste and swallowing.   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  Upper endoscopy on 11/03/2020 revealed a malignant esophageal tumor at the gastroesophageal junction. There were esophageal mucosal changes c/w short-segment  Barrett's esophagus. There was a 2 cm hiatal hernia. There was gastritis.  Pathology in the esophagus at 36 cm revealed intramucosal adenocarcinoma arising in a background of high grade dysplasia and intestinal metaplasia; pathology at 38 cm revealed adenocarcinoma.  EUS on 11/15/2020 revealed early stage adenocarcinoma arising from Barrett's esophagus.  Lesion was T1bN0 and non-obstructing.  He has PTSD and clastrophobia.  Anxiety is relieved by alprazolam 0.25 mg prior to radiation.  He is on citalopram 20 mg a day and trazodone at night.  Symptomatically, he feels "washed out".  He notes change in taste and difficulty swallowing.  Exam reveals radiation changes.  Right neck mass is 1.8 x 1.3 cm.  Plan: 1.   Labs today: CBC with diff, CMP, Mg. 2. Clinical stage I right base of tongue carcinoma Clinical stage I. 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 PET scan on 10/25/2020 revealed a right base of tongue primary with ipsilateral level 2 nodal metastasis. No evidence of distant metastasis. Plan: cisplatin 40 mg/m2 weekly x 7 with radiation.             He began concurrent radiation and cisplatin on 11/20/2020.  Clinically, he is doing fairly well.  He has progressive radiation mucositis/esophagitis.   Neck adenopathy is slowly decreasing in size.  Labs reviewed.  Week #6 cisplatin today.   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  Discuss symptom management.  He has antiemetics at home to use on a prn bases.   Interventions are adequate. 3. 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 EUS on 11/15/2020 revealed a T1bN0 lesion.  He may be a candidate for endoscopic resection followed by ablation or esophagectomy.  He is scheduled to see Dr. Sherry Ruffing (rather than Dr Elenor Quinones) at [facility] on 01/11/2021 4.   Mucositis  Patient has radiation-induced mucositis and esophagitis.  Oral exam continues to reveal only mild changes.  He is on Carafate and Magic mouthwash.  He declines pain medications.  He has been seen by nutrition who recommends Ensure Complete and Boost Plus and blended food 5. Anxiety  Anxiety remains well controlled on his current regimen.  He is scheduled for an appointment with behavioral health on 01/01/2021.  Continue to monitor. 6.   Hyponatremia             Sodium 132.             Encourage fluids with electrolytes.  Discuss consideration of IV fluids if concerns with dehydration with increasing mucositis.  Patient declines fluids this week. 7.   Hypomagnesemia  Magnesium 1.6 today.  Magnesium 1 gm IV today. 8.   Week #6 cisplatin today. 9.   RTC in 1 week for MD assessment, labs (CBC with diff, CMP, Mg) and week #7 cisplatin.  I discussed the assessment and treatment plan with the patient.  The patient was provided an opportunity to ask questions and all were answered.  The patient agreed with the plan and demonstrated an understanding of the instructions.  The patient was advised to call back if the symptoms worsen or if the condition fails to improve as anticipated.  I provided 19 minutes of face-to-face time during this this encounter and > 50% was spent counseling as documented under my assessment and plan.  An additional 10  minutes were spent reviewing her  chart (Epic and Care Everywhere) including notes, labs, and imaging studies.   Kennidi Yoshida C. Mike Gip, MD, PhD    12/27/2020, 9:53 AM   I, Mirian Mo Tufford, am acting as Education administrator for [employer]. Mike Gip, MD, PhD.  I, Ethell Blatchford C. Mike Gip, MD, have reviewed the above documentation for accuracy and completeness, and I agree with the above.

## 2020-12-25 NOTE — Progress Notes (Addendum)
Nutrition Assessment   Reason for Assessment:   Weight loss, trouble swallowing   ASSESSMENT: 75 year old male with right base of tongue cancer and stage Ib adenocarcinoma GE junction. Past medical history of DM, GERD, HTN, HLD.  Received message from radiation RN saying that patient had cancelled radiation appointment for today as not feeling well.  Called to speak with wife and agreeable to phone visit.  Wife and patient were on speaker phone.  Patient taking bites of greek yogurt this am and has drank 1/5th of gatorade this am.  Over the last week intake has decreased. Reports painful swallowing, no taste.  Has been taking carafate without relief. Says that he has white patches in his mouth.  Has been trying to drink 4 glucerna shakes per day recently.    Wife reports calling EMS this am as patient was dizzy.  EMS came out and checked oxygen, blood glucose 220 and blood pressure.  Wife said blood pressure and oxygen were "fine".     Medications: carafate, vit D, compazine, zofran, metformin, glipizide, nexium   Labs: glucose 169 on 2/2, Mag 1.6   Anthropometrics:   Height: 69 inches Weight: 196 lb on 2/2 UBW: 212 lb 1/3  BMI: 27 7% weight loss in the last month, significant   Estimated Energy Needs  Kcals: 2200-2600 Protein: 110-130 g Fluid: 2.2 L   NUTRITION DIAGNOSIS: Inadequate oral intake related to cancer related treatment side effects as evidenced by 7% weight loss in the last month and poor po intake   INTERVENTION:  Recommend switching from glucerna to ensure complete shake (350 calories, 30 g protein, higher in carbs) or boost plus (360 calories, 14 g protein) as overall intake extremely poor.  Would not restrict carbohydrates at this time due to poor po intake and limited textures as what patient can eat.  Would recommend checking blood glucose regularly and adjusting diabetes medication to control blood glucose. Will leave samples of shake for patient to pick up  and try.  Discussed ways to add calories and protein to foods. Recipe booklet left for patient to pick up as well.  Discussed blending, pureeing foods for ease of swallowing Notified Lauren, NP with [facility] regarding patient status. She will call patient today.   Contact information provided   MONITORING, EVALUATION, GOAL: weight trends, intake   Next Visit: Monday, Feb 14 after radiation  Noma Quijas B. Zenia Resides, [place], [place] Registered Dietitian [phone] (mobile)

## 2020-12-26 ENCOUNTER — Inpatient Hospital Stay: Payer: No Typology Code available for payment source

## 2020-12-26 ENCOUNTER — Inpatient Hospital Stay (HOSPITAL_BASED_OUTPATIENT_CLINIC_OR_DEPARTMENT_OTHER): Payer: No Typology Code available for payment source | Admitting: Nurse Practitioner

## 2020-12-26 ENCOUNTER — Ambulatory Visit
Admission: RE | Admit: 2020-12-26 | Discharge: 2020-12-26 | Disposition: A | Discharge status: Home / Self Care | Payer: No Typology Code available for payment source | Source: Ambulatory Visit | Attending: Radiation Oncology | Admitting: Radiation Oncology

## 2020-12-26 ENCOUNTER — Other Ambulatory Visit: Payer: Self-pay | Admitting: Nurse Practitioner

## 2020-12-26 ENCOUNTER — Other Ambulatory Visit: Payer: Self-pay | Admitting: Hospice and Palliative Medicine

## 2020-12-26 VITALS — BP 146/78 | HR 71 | Temp 96.5°F | Resp 18 | Wt 194.1 lb

## 2020-12-26 DIAGNOSIS — T66XXXA Radiation sickness, unspecified, initial encounter: Secondary | ICD-10-CM

## 2020-12-26 DIAGNOSIS — K1231 Oral mucositis (ulcerative) due to antineoplastic therapy: Secondary | ICD-10-CM

## 2020-12-26 DIAGNOSIS — K1233 Oral mucositis (ulcerative) due to radiation: Secondary | ICD-10-CM | POA: Diagnosis not present

## 2020-12-26 DIAGNOSIS — K208 Other esophagitis without bleeding: Secondary | ICD-10-CM | POA: Diagnosis not present

## 2020-12-26 DIAGNOSIS — C160 Malignant neoplasm of cardia: Secondary | ICD-10-CM | POA: Diagnosis not present

## 2020-12-26 DIAGNOSIS — C01 Malignant neoplasm of base of tongue: Secondary | ICD-10-CM

## 2020-12-26 DIAGNOSIS — Z510 Encounter for antineoplastic radiation therapy: Secondary | ICD-10-CM | POA: Diagnosis not present

## 2020-12-26 LAB — COMPREHENSIVE METABOLIC PANEL
ALT: 25 U/L (ref 0–44)
AST: 20 U/L (ref 15–41)
Albumin: 3.7 g/dL (ref 3.5–5.0)
Alkaline Phosphatase: 50 U/L (ref 38–126)
Anion gap: 10 (ref 5–15)
BUN: 23 mg/dL (ref 8–23)
CO2: 24 mmol/L (ref 22–32)
Calcium: 8.9 mg/dL (ref 8.9–10.3)
Chloride: 96 mmol/L — ABNORMAL LOW (ref 98–111)
Creatinine, Ser: 0.91 mg/dL (ref 0.61–1.24)
GFR, Estimated: >60 mL/min (ref >60)
Glucose, Bld: 178 mg/dL — ABNORMAL HIGH (ref 70–99)
Potassium: 3.7 mmol/L (ref 3.5–5.1)
Sodium: 130 mmol/L — ABNORMAL LOW (ref 135–145)
Total Bilirubin: 1.1 mg/dL (ref 0.3–1.2)
Total Protein: 6.6 g/dL (ref 6.5–8.1)

## 2020-12-26 LAB — CBC WITH DIFFERENTIAL/PLATELET
Abs Immature Granulocytes: 0.04 10*3/uL (ref 0.00–0.07)
Basophils Absolute: 0 10*3/uL (ref 0.0–0.1)
Basophils Relative: 0 %
Eosinophils Absolute: 0 10*3/uL (ref 0.0–0.5)
Eosinophils Relative: 0 %
HCT: 36 % — ABNORMAL LOW (ref 39.0–52.0)
Hemoglobin: 12.7 g/dL — ABNORMAL LOW (ref 13.0–17.0)
Immature Granulocytes: 1 %
Lymphocytes Relative: 7 %
Lymphs Abs: 0.6 10*3/uL — ABNORMAL LOW (ref 0.7–4.0)
MCH: 30.2 pg (ref 26.0–34.0)
MCHC: 35.3 g/dL (ref 30.0–36.0)
MCV: 85.5 fL (ref 80.0–100.0)
Monocytes Absolute: 0.5 10*3/uL (ref 0.1–1.0)
Monocytes Relative: 6 %
Neutro Abs: 7.4 10*3/uL (ref 1.7–7.7)
Neutrophils Relative %: 86 %
Platelets: 141 10*3/uL — ABNORMAL LOW (ref 150–400)
RBC: 4.21 MIL/uL — ABNORMAL LOW (ref 4.22–5.81)
RDW: 13.2 % (ref 11.5–15.5)
WBC: 8.6 10*3/uL (ref 4.0–10.5)
nRBC: 0 % (ref 0.0–0.2)

## 2020-12-26 MED ORDER — TRAZODONE HCL 50 MG PO TABS: 25.0000 mg | ORAL_TABLET | Freq: Every evening | ORAL | 2 refills | Status: DC | PRN

## 2020-12-26 MED ORDER — SODIUM CHLORIDE 0.9 % IV SOLN
Freq: Once | INTRAVENOUS | Status: AC
  Filled 2020-12-26: qty 250

## 2020-12-26 MED ORDER — SODIUM CHLORIDE 0.9% FLUSH
10.0000 mL | INTRAVENOUS | Status: DC | PRN
  Administered 2020-12-26: 10 mL via INTRAVENOUS
  Filled 2020-12-26: qty 10

## 2020-12-26 MED ORDER — HEPARIN SOD (PORK) LOCK FLUSH 100 UNIT/ML IV SOLN
500.0000 [IU] | Freq: Once | INTRAVENOUS | Status: AC
  Administered 2020-12-26: 500 [IU] via INTRAVENOUS
  Filled 2020-12-26: qty 5

## 2020-12-26 NOTE — Patient Instructions (Signed)
Oral Mucositis Oral mucositis is a mouth condition that may develop as a result of treatments for cancer. Sores may appear on the lips, gums, tongue, throat, and the top (roof) or bottom (floor) of the mouth. What are the causes? This condition is caused when cancer treatments damage the lining of the mouth. This condition can happen to anyone who is being treated with cancer therapies, including:  Cancer medicines (chemotherapy) or targeted therapy.  Radiation therapy.  Bone marrow transplants and stem cell transplants. Oral mucositis is not caused by infection. However, the sores can become infected after they form. Infection can make oral mucositis worse. What increases the risk? The following factors may make you more likely to develop this condition:  Having cancers that primarily affect the blood, head, or neck.  Receiving radiation therapy alone, or in combination with chemotherapy, to the head and neck region.  Undergoing high dose chemotherapy alone or as part of bone marrow or stem cell transplant. In addition, there are other risks, including:  Having poor oral hygiene, dental problems or oral diseases.  Wearing dentures that do not fit correctly.  Having other medical conditions, such as diabetes, HIV, AIDS, or kidney disease.  Using products that contain nicotine or tobacco, such as cigarettes, chewing tobacco, and e-cigarettes.  Not drinking enough clear fluids.  Drinking alcohol. What are the signs or symptoms? Symptoms of this condition include:  Mouth sores. These sores may bleed.  Color changes inside the mouth. Red, shiny areas may appear.  White patches or pus in the mouth.  Pain in the mouth and throat. This can make it painful to speak and swallow.  Dryness and a burning feeling in the mouth.  Saliva that is thick.  Trouble eating, drinking, and swallowing. This can lead to weight loss. Symptoms of this condition can vary from mild to severe.  Symptoms are usually seen 7-10 days after cancer treatment has started. How is this diagnosed? This condition can be diagnosed with a physical exam. In some cases, lab tests or cultures may be done to check for an associated infection. How is this treated? Treatment depends on the severity of the condition. Oral mucositis often heals on its own. Sometimes, changes in the cancer treatment can help. Treatment may include medicines or therapies, such as:  An antibiotic medicine to fight infection.  Medicine or therapies that help the cells in your mouth heal more quickly.  Chewing on ice before treatment to help prevent mucositis. Medicine may also be given to help control pain. This may include:  Pain relievers that are swished around in the mouth (topical anesthetics). These make the mouth numb to ease the pain.  Mouth rinses.  Prescribed, medicated gels. The gel coats the mouth. This protects nerve endings and lessens the pain.  Pain medicines. Follow these instructions at home: Medicines  Take or apply over-the-counter and prescription medicines only as told by your health care provider.  If you were prescribed an antibiotic medicine, take or apply it as told by your health care provider. Do not stop using the antibiotic even if you start to feel better.  Do not use products that contain benzocaine, including numbing gels, to treat mouth pain in children who are younger than 2 years. These products may cause a rare but serious blood condition. Eating and drinking  Talk to a diet and nutrition specialist (dietitian) about what you should eat and drink if you have mucositis.  Drink high-nutrition and high-calorie shakes or supplements.    Eat bland and soft foods that are easy to eat.  Drink enough fluid to keep your urine pale yellow.  Do not eat foods that are hot, spicy, citrus, or hard to swallow.  Do not drink alcohol.  Try sucking on ice chips or sugar-free frozen pops.  This may help with pain. This also keeps your mouth moist.   Lifestyle  Keep your mouth clean and germ-free. To maintain good oral hygiene: ? Brush your teeth carefully with a soft toothbrush at least two times each day. Use a gentle toothpaste. Ask your health care provider to recommend the right toothpaste for you. ? Use a soft sponge (oral swab) to clean your mouth and teeth instead of a toothbrush if mouth sores are severe. ? Floss your teeth every day. ? Have your teeth cleaned regularly as recommended by your dentist. ? Rinse your mouth after every meal or as directed by your health care provider. Do not use mouthwash that contains alcohol. Ask your health care provider for a mouthwash or mouth rinse recommendation.  Do not use any products that contain nicotine or tobacco, such as cigarettes, e-cigarettes, and chewing tobacco. If you need help quitting, ask your health care provider.      General instructions  Follow instructions from your health care provider about: ? Cleaning mouth sores. ? Taking out your dentures. ? Changing your diet or finding other ways to get nutrients. This is important if you are losing weight.  If your lips are dry or cracked, apply a water-based moisturizer to your lips as needed.  Keep all follow-up visits as told by your health care provider. This is important. Contact a health care provider if:  You have mouth pain or throat pain.  Your symptoms get worse.  You have new symptoms.  Your pain is not controlled with medicine.  You have trouble speaking. Get help right away if you:  Have a fever.  Cannot swallow solid food or liquids.  Have a lot of bleeding in your mouth.  Develop new, open, or draining sores in your mouth. Summary  Oral mucositis is a mouth condition that may develop as a result of treatments for cancer.  Sores may appear on your lips, gums, tongue, throat, and the top (roof) or bottom (floor) of your  mouth.  Cancer treatments can damage the lining of the mouth, which causes this condition.  Treatment depends on how severe the condition is. It may include medicine or therapies to fight infection, medicine to ease pain, or medicine to help the cells in your mouth heal more quickly. This information is not intended to replace advice given to you by your health care provider. Make sure you discuss any questions you have with your health care provider. Document Revised: 06/14/2019 Document Reviewed: 06/14/2019 Elsevier Patient Education  2021 [employer]  

## 2020-12-26 NOTE — Progress Notes (Signed)
Receiving 0.9 % NS 1 liter as a result of mucositis and dehydration. Pt very pleasant. Sipping on water during infusion. States nothing tastes good. Reports a large BM yesterday. Denies N/V. Pt states he feels better already. Very appreciative of visit today. Prescription for Medicated Mouthwash faxed to [facility]. Discharged to home.

## 2020-12-26 NOTE — Progress Notes (Signed)
Trazodone refilled per patient's request.

## 2020-12-26 NOTE — Progress Notes (Signed)
Add on [facility] visit today for mucositis and dehydration due to difficulty swallowing related to radiation treatments. Pt states he is drinking 4 cans ensure per day. This takes a lot of effort on his part. He is trying to drink fluids in between but admits it is difficult to drink as much as he should.

## 2020-12-26 NOTE — Progress Notes (Signed)
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 Symptoms started gradually as he has progressed through his radiation treatments. Associated symptoms: taste changes, tenderness when swallowing, dry mouth. Was started on sucralfate which improved symptoms. Recently started steroids which also helped however, symptoms persist. Rates pain 5/10. Weight has down trended. Followed by dietician. Drinks water, sports drinks, and nutritional shakes. EMS was called to home yesterday due to weakness. He declined transport to ER. Vital signs were 'normal' from what he was told. No dizziness today. No recent fevers or illness. No easy bleeding or bruising. Denies chest pain. Denies any nausea, vomiting, constipation, or diarrhea. Denies urinary complaints. Patient offers no further specific complaints today.  Review of systems- Review of Systems  Constitutional: Positive for malaise/fatigue and weight loss. Negative for chills and fever.  HENT: Positive for sore throat.  Negative for hearing loss, nosebleeds and tinnitus.   Eyes: Negative for blurred vision and double vision.  Respiratory: Negative for cough, hemoptysis, shortness of breath and wheezing.   Cardiovascular: Negative for chest pain, palpitations and leg swelling.  Gastrointestinal: Negative for abdominal pain, blood in stool, constipation, diarrhea, melena, nausea and vomiting.  Genitourinary: Negative for dysuria and urgency.  Musculoskeletal: Negative for back pain, falls, joint pain and myalgias.  Skin: Negative for itching and rash.  Neurological: Negative for dizziness, tingling, sensory change, loss of consciousness, weakness and headaches.  Endo/Heme/Allergies: Negative for environmental allergies. Does not bruise/bleed easily.  Psychiatric/Behavioral: Negative for depression. The patient is nervous/anxious and has insomnia.      No Known Allergies  Past Medical History:  Diagnosis Date  . Anxiety   . Cancer [facility])    Head/throat Cancer at the base of the tongue  . Cataract    lt eye repair; present in rt eye but not ripe yet.  . Claustrophobia   . Diabetes mellitus without complication ([place])   . GERD (gastroesophageal reflux disease)   . Glaucoma    using gtts for this.  . Hypercholesterolemia   . Hypertension     Past Surgical History:  Procedure Laterality Date  . CATARACT EXTRACTION W/PHACO Left 12/03/2017   Procedure: CATARACT EXTRACTION PHACO AND INTRAOCULAR LENS PLACEMENT ([place]) COMPLICATED DIABETIC LEFT;  Surgeon: Leandrew Koyanagi, MD;  Location: [facility];  Service: Ophthalmology;  Laterality: Left;  Diabetic - oral meds  . CHOLECYSTECTOMY    . COLONOSCOPY  2016  . PORTA CATH INSERTION N/A 11/13/2020   Procedure: PORTA CATH INSERTION;  Surgeon: Algernon Huxley, MD;  Location: [place] [facility];  Service: Cardiovascular;  Laterality: N/A;    Social  History   Socioeconomic History  . Marital status: Married    Spouse name: Not on file  . Number of  children: Not on file  . Years of education: Not on file  . Highest education level: Not on file  Occupational History  . Not on file  Tobacco Use  . Smoking status: Former Smoker    Quit date: 1986    Years since quitting: 36.1  . Smokeless tobacco: Never Used  Vaping Use  . Vaping Use: Never used  Substance and Sexual Activity  . Alcohol use: Not Currently    Comment: occasional - 1 glass wine/month  . Drug use: Never  . Sexual activity: Not on file  Other Topics Concern  . Not on file  Social History Narrative  . Not on file   Social Determinants of Health   Financial Resource Strain: Not on file  Food Insecurity: Not on file  Transportation Needs: Not on file  Physical Activity: Not on file  Stress: Not on file  Social Connections: Not on file  Intimate Partner Violence: Not on file   Immunization History  Administered Date(s) Administered  . Influenza, High Dose Seasonal PF 08/19/2018  . Influenza-Unspecified 08/12/2014, 09/05/2015, 07/12/2016, 08/30/2020  . PFIZER(Purple Top)SARS-COV-2 Vaccination 12/08/2019, 01/31/2020, 09/11/2020  . Pneumococcal Conjugate-13 11/05/2016     Family History  Problem Relation Age of Onset  . Cancer Mother   . Colon cancer Neg Hx   . Colon polyps Neg Hx   . Esophageal cancer Neg Hx   . Rectal cancer Neg Hx   . Stomach cancer Neg Hx      Current Outpatient Medications:  .  ALPRAZolam (XANAX) 0.25 MG tablet, Take 1 tablet (0.25 mg total) by mouth at bedtime as needed for anxiety., Disp: 60 tablet, Rfl: 0 .  ALPRAZolam (XANAX) 0.5 MG tablet, Take 1 tablet (0.5 mg total) by mouth 3 (three) times daily as needed for anxiety. (Patient not taking: No sig reported), Disp: 45 tablet, Rfl: 0 .  brimonidine-timolol (COMBIGAN) 0.2-0.5 % ophthalmic solution, Place 1 drop into the left eye daily., Disp: , Rfl:  .  calcium carbonate (OSCAL) 1500 (600 Ca) MG TABS tablet, Take by mouth 2 (two) times daily with a meal., Disp: , Rfl:  .   citalopram (CELEXA) 20 MG tablet, Take 20 mg by mouth daily., Disp: , Rfl:  .  dexamethasone (DECADRON) 4 MG tablet, Take 2 tablets (8 mg total) by mouth daily. Take daily x 3 days starting the day after cisplatin chemotherapy. Take with food., Disp: 30 tablet, Rfl: 1 .  esomeprazole (NEXIUM) 20 MG capsule, Take 40 mg by mouth daily at 12 noon., Disp: , Rfl:  .  fluticasone (FLONASE) 50 MCG/ACT nasal spray, Place into both nostrils., Disp: , Rfl:  .  glipiZIDE (GLUCOTROL XL) 5 MG 24 hr tablet, Take 5 mg by mouth 2 (two) times daily., Disp: , Rfl: 1 .  glucose blood test strip, CHECK FASTING BLOOD SUGAR EVERY MORNING. (NEED SIGNATURE), Disp: , Rfl:  .  lidocaine-prilocaine (EMLA) cream, Apply to affected area once, Disp: 30 g, Rfl: 3 .  losartan (COZAAR) 100 MG tablet, Take 100 mg by mouth daily., Disp: , Rfl:  .  metFORMIN (GLUCOPHAGE-XR) 500 MG 24 hr tablet, Take 500 mg by mouth 2 (two) times daily. Takes 2 tablets and 1 tablet in the pm, Disp: , Rfl:  .  ondansetron (ZOFRAN) 8 MG tablet, Take 1 tablet (8 mg total) by mouth 2 (two) times  daily as needed. Start on the third day after cisplatin chemotherapy. (Patient not taking: No sig reported), Disp: 30 tablet, Rfl: 1 .  prochlorperazine (COMPAZINE) 10 MG tablet, Take 1 tablet (10 mg total) by mouth every 6 (six) hours as needed (Nausea or vomiting). (Patient not taking: No sig reported), Disp: 30 tablet, Rfl: 1 .  simvastatin (ZOCOR) 40 MG tablet, Take by mouth., Disp: , Rfl:  .  sucralfate (CARAFATE) 1 g tablet, Take 0.5 tablets (0.5 g total) by mouth 3 (three) times daily. Dissolve in warm water, swish and swallow, Disp: 60 tablet, Rfl: 1 .  traZODone (DESYREL) 50 MG tablet, Take 0.5-1 tablets (25-50 mg total) by mouth at bedtime as needed for sleep., Disp: 30 tablet, Rfl: 2 .  VITAMIN D PO, Take 800 Units by mouth daily., Disp: , Rfl:   Current Facility-Administered Medications:  .  0.9 %  sodium chloride infusion, 500 mL, Intravenous, Once,  Pyrtle, Lajuan Lines, MD  Facility-Administered Medications Ordered in Other Visits:  .  0.9 %  sodium chloride infusion, , Intravenous, Once, Verlon Au, NP, Last Rate: 999 mL/hr at 12/26/20 1124, New Bag at 12/26/20 1124 .  0.9 % NaCl with KCl 20 mEq/ L  infusion, , Intravenous, Once, Corcoran, Melissa C, MD .  CISplatin (PLATINOL) 87 mg in sodium chloride 0.9 % 250 mL chemo infusion, 40 mg/m2 (Order-Specific), Intravenous, Once, Corcoran, Melissa C, MD .  dexamethasone (DECADRON) 10 mg in sodium chloride 0.9 % 50 mL IVPB, 10 mg, Intravenous, Once, Corcoran, Melissa C, MD .  fosaprepitant (EMEND) 150 mg in sodium chloride 0.9 % 145 mL IVPB, 150 mg, Intravenous, Once, Corcoran, Melissa C, MD .  heparin lock flush 100 unit/mL, 500 Units, Intravenous, Once, Verlon Au, NP .  magnesium sulfate IVPB 2 g 50 mL, 2 g, Intravenous, Once, Corcoran, Melissa C, MD .  sodium chloride flush (NS) 0.9 % injection 10 mL, 10 mL, Intravenous, PRN, Verlon Au, NP  Physical exam:  Vitals:   12/26/20 1139  BP: (!) 146/78  Pulse: 71  Resp: 18  Temp: (!) 96.5 F (35.8 C)  TempSrc: Tympanic  Weight: 194 lb 1.6 oz (88 kg)   Physical Exam Constitutional:      General: He is not in acute distress.    Appearance: He is well-developed and well-nourished.  HENT:     Head: Normocephalic and atraumatic.     Comments: hoarse    Nose: Nose normal. No congestion or rhinorrhea.     Mouth/Throat:     Mouth: Oropharynx is clear and moist. Mucous membranes are moist.     Pharynx: Oropharynx is clear. No oropharyngeal exudate or posterior oropharyngeal erythema.     Comments: No evidence of erythema, fungal infection, lesions, or ulcerations.  Eyes:     General: No scleral icterus.    Extraocular Movements: EOM normal.     Conjunctiva/sclera: Conjunctivae normal.  Neck:     Comments: erythematous Pulmonary:     Effort: No respiratory distress.     Breath sounds: No wheezing.  Abdominal:     General:  Bowel sounds are normal. There is no distension.     Palpations: Abdomen is soft.     Tenderness: There is no abdominal tenderness.  Musculoskeletal:        General: No edema. Normal range of motion.  Skin:    General: Skin is warm and dry.  Neurological:     Mental Status: He is alert and oriented to person,  place, and time.  Psychiatric:        Mood and Affect: Mood is anxious.        Behavior: Behavior normal.      CMP Latest Ref Rng & Units 12/20/2020  Glucose 70 - 99 mg/dL 169(H)  BUN 8 - 23 mg/dL 20  Creatinine 0.61 - 1.24 mg/dL 0.90  Sodium 135 - 145 mmol/L 130(L)  Potassium 3.5 - 5.1 mmol/L 4.0  Chloride 98 - 111 mmol/L 93(L)  CO2 22 - 32 mmol/L 23  Calcium 8.9 - 10.3 mg/dL 8.9  Total Protein 6.5 - 8.1 g/dL 6.8  Total Bilirubin 0.3 - 1.2 mg/dL 0.8  Alkaline Phos 38 - 126 U/L 49  AST 15 - 41 U/L 18  ALT 0 - 44 U/L 23   CBC Latest Ref Rng & Units 12/20/2020  WBC 4.0 - 10.5 K/uL 7.6  Hemoglobin 13.0 - 17.0 g/dL 12.7(L)  Hematocrit 39.0 - 52.0 % 37.2(L)  Platelets 150 - 400 K/uL 206    No images are attached to the encounter.  No results found.  Assessment and plan- Patient is a 75 y.o. male diagnosed with head and neck cancer currently undergoing radiation who presents for radiation induced esophagitis and mucositis. IV fluids today for low sodium. Dietary modifications to increase calorie intake, reduce weight loss discussed. Hold off on appetite stimulant currently. Provided with samples of nutritional shakes and ensure juven, a supplement for tissue healing. Can also try Healios (available through [facility]). Will send prescription for Magic Mouthwash (swish and swallow) for inflammationa nd comfort. Provided education regarding hygiene and management of dry mouth. Samples of topicals provided. Continue short courses of steroids for appetite, energy, and inflammation. Monitor blood sugars closely in setting of diabetes. Continue sucralfate as prescribed. If symptoms do not  improve or develops symptoms of thrush, consider diflucan.   Return to clinic if symptoms do not improve or worsen int he interim.    Visit Diagnosis 1. Radiation esophagitis   2. Mucositis due to radiation therapy    Patient expressed understanding and was in agreement with this plan. He also understands that He can call clinic at any time with any questions, concerns, or complaints.   Thank you for allowing me to participate in the care of this very pleasant patient.   Beckey Rutter, DNP, AGNP-C [facility]

## 2020-12-27 ENCOUNTER — Inpatient Hospital Stay: Payer: No Typology Code available for payment source

## 2020-12-27 ENCOUNTER — Encounter: Payer: Self-pay | Admitting: Hematology and Oncology

## 2020-12-27 ENCOUNTER — Ambulatory Visit
Admission: RE | Admit: 2020-12-27 | Discharge: 2020-12-27 | Disposition: A | Discharge status: Home / Self Care | Payer: No Typology Code available for payment source | Source: Ambulatory Visit | Attending: Radiation Oncology | Admitting: Radiation Oncology

## 2020-12-27 ENCOUNTER — Inpatient Hospital Stay (HOSPITAL_BASED_OUTPATIENT_CLINIC_OR_DEPARTMENT_OTHER): Payer: No Typology Code available for payment source | Admitting: Hematology and Oncology

## 2020-12-27 ENCOUNTER — Other Ambulatory Visit: Payer: Self-pay

## 2020-12-27 ENCOUNTER — Telehealth: Payer: Self-pay

## 2020-12-27 VITALS — BP 128/65 | HR 72 | Temp 96.0°F | Resp 16 | Wt 196.1 lb

## 2020-12-27 DIAGNOSIS — E871 Hypo-osmolality and hyponatremia: Secondary | ICD-10-CM | POA: Diagnosis not present

## 2020-12-27 DIAGNOSIS — K1233 Oral mucositis (ulcerative) due to radiation: Secondary | ICD-10-CM

## 2020-12-27 DIAGNOSIS — Z510 Encounter for antineoplastic radiation therapy: Secondary | ICD-10-CM | POA: Diagnosis not present

## 2020-12-27 DIAGNOSIS — C01 Malignant neoplasm of base of tongue: Secondary | ICD-10-CM | POA: Diagnosis not present

## 2020-12-27 DIAGNOSIS — C159 Malignant neoplasm of esophagus, unspecified: Secondary | ICD-10-CM

## 2020-12-27 DIAGNOSIS — Z5111 Encounter for antineoplastic chemotherapy: Secondary | ICD-10-CM

## 2020-12-27 LAB — CBC WITH DIFFERENTIAL/PLATELET
Abs Immature Granulocytes: 0.04 10*3/uL (ref 0.00–0.07)
Basophils Absolute: 0 10*3/uL (ref 0.0–0.1)
Basophils Relative: 0 %
Eosinophils Absolute: 0 10*3/uL (ref 0.0–0.5)
Eosinophils Relative: 0 %
HCT: 33.9 % — ABNORMAL LOW (ref 39.0–52.0)
Hemoglobin: 11.9 g/dL — ABNORMAL LOW (ref 13.0–17.0)
Immature Granulocytes: 1 %
Lymphocytes Relative: 6 %
Lymphs Abs: 0.4 10*3/uL — ABNORMAL LOW (ref 0.7–4.0)
MCH: 30.3 pg (ref 26.0–34.0)
MCHC: 35.1 g/dL (ref 30.0–36.0)
MCV: 86.3 fL (ref 80.0–100.0)
Monocytes Absolute: 0.4 10*3/uL (ref 0.1–1.0)
Monocytes Relative: 6 %
Neutro Abs: 6.1 10*3/uL (ref 1.7–7.7)
Neutrophils Relative %: 87 %
Platelets: 113 10*3/uL — ABNORMAL LOW (ref 150–400)
RBC: 3.93 MIL/uL — ABNORMAL LOW (ref 4.22–5.81)
RDW: 13.3 % (ref 11.5–15.5)
WBC: 6.9 10*3/uL (ref 4.0–10.5)
nRBC: 0 % (ref 0.0–0.2)

## 2020-12-27 LAB — COMPREHENSIVE METABOLIC PANEL
ALT: 21 U/L (ref 0–44)
AST: 18 U/L (ref 15–41)
Albumin: 3.8 g/dL (ref 3.5–5.0)
Alkaline Phosphatase: 44 U/L (ref 38–126)
Anion gap: 11 (ref 5–15)
BUN: 21 mg/dL (ref 8–23)
CO2: 25 mmol/L (ref 22–32)
Calcium: 8.9 mg/dL (ref 8.9–10.3)
Chloride: 94 mmol/L — ABNORMAL LOW (ref 98–111)
Creatinine, Ser: 0.93 mg/dL (ref 0.61–1.24)
GFR, Estimated: >60 mL/min (ref >60)
Glucose, Bld: 145 mg/dL — ABNORMAL HIGH (ref 70–99)
Potassium: 3.9 mmol/L (ref 3.5–5.1)
Sodium: 130 mmol/L — ABNORMAL LOW (ref 135–145)
Total Bilirubin: 0.8 mg/dL (ref 0.3–1.2)
Total Protein: 6.6 g/dL (ref 6.5–8.1)

## 2020-12-27 LAB — MAGNESIUM: Magnesium: 1.6 mg/dL — ABNORMAL LOW (ref 1.7–2.4)

## 2020-12-27 MED ORDER — SODIUM CHLORIDE 0.9 % IV SOLN: Freq: Once | INTRAVENOUS | Status: DC

## 2020-12-27 MED ORDER — SODIUM CHLORIDE 0.9 % IV SOLN
Freq: Once | INTRAVENOUS | Status: AC
  Filled 2020-12-27: qty 250

## 2020-12-27 MED ORDER — POTASSIUM CHLORIDE IN NACL 20-0.9 MEQ/L-% IV SOLN
Freq: Once | INTRAVENOUS | Status: AC
  Filled 2020-12-27: qty 1000

## 2020-12-27 MED ORDER — SODIUM CHLORIDE 0.9 % IV SOLN
40.0000 mg/m2 | Freq: Once | INTRAVENOUS | Status: AC
  Administered 2020-12-27: 87 mg via INTRAVENOUS
  Filled 2020-12-27: qty 87

## 2020-12-27 MED ORDER — PALONOSETRON HCL INJECTION 0.25 MG/5ML
0.2500 mg | Freq: Once | INTRAVENOUS | Status: AC
  Administered 2020-12-27: 0.25 mg via INTRAVENOUS
  Filled 2020-12-27: qty 5

## 2020-12-27 MED ORDER — SODIUM CHLORIDE 0.9 % IV SOLN
1.0000 g | Freq: Once | INTRAVENOUS | Status: AC
  Administered 2020-12-27: 1 g via INTRAVENOUS
  Filled 2020-12-27: qty 2

## 2020-12-27 MED ORDER — SODIUM CHLORIDE 0.9 % IV SOLN
10.0000 mg | Freq: Once | INTRAVENOUS | Status: AC
  Administered 2020-12-27: 10 mg via INTRAVENOUS
  Filled 2020-12-27: qty 10

## 2020-12-27 MED ORDER — MAGNESIUM SULFATE 2 GM/50ML IV SOLN
2.0000 g | Freq: Once | INTRAVENOUS | Status: AC
  Administered 2020-12-27: 2 g via INTRAVENOUS
  Filled 2020-12-27: qty 50

## 2020-12-27 MED ORDER — SODIUM CHLORIDE 0.9 % IV SOLN
150.0000 mg | Freq: Once | INTRAVENOUS | Status: AC
  Administered 2020-12-27: 150 mg via INTRAVENOUS
  Filled 2020-12-27: qty 5

## 2020-12-27 MED ORDER — HEPARIN SOD (PORK) LOCK FLUSH 100 UNIT/ML IV SOLN
500.0000 [IU] | Freq: Once | INTRAVENOUS | Status: AC | PRN
  Administered 2020-12-27: 500 [IU]
  Filled 2020-12-27: qty 5

## 2020-12-27 NOTE — Progress Notes (Signed)
Patient has no concerns at the moment. 

## 2020-12-27 NOTE — Progress Notes (Signed)
Patient stable at discharge.

## 2020-12-27 NOTE — Telephone Encounter (Signed)
I spoke with patient and he stated that he is still doing radiation.

## 2020-12-28 ENCOUNTER — Ambulatory Visit
Admission: RE | Admit: 2020-12-28 | Discharge: 2020-12-28 | Disposition: A | Discharge status: Home / Self Care | Payer: No Typology Code available for payment source | Source: Ambulatory Visit | Attending: Radiation Oncology | Admitting: Radiation Oncology

## 2020-12-28 DIAGNOSIS — Z510 Encounter for antineoplastic radiation therapy: Secondary | ICD-10-CM | POA: Diagnosis not present

## 2020-12-29 ENCOUNTER — Ambulatory Visit
Admission: RE | Admit: 2020-12-29 | Discharge: 2020-12-29 | Disposition: A | Discharge status: Home / Self Care | Payer: No Typology Code available for payment source | Source: Ambulatory Visit | Attending: Radiation Oncology | Admitting: Radiation Oncology

## 2020-12-29 DIAGNOSIS — Z510 Encounter for antineoplastic radiation therapy: Secondary | ICD-10-CM | POA: Diagnosis not present

## 2021-01-01 ENCOUNTER — Inpatient Hospital Stay: Payer: No Typology Code available for payment source

## 2021-01-01 ENCOUNTER — Inpatient Hospital Stay: Payer: No Typology Code available for payment source | Attending: Hematology and Oncology

## 2021-01-01 ENCOUNTER — Encounter: Payer: Self-pay | Admitting: Psychiatry

## 2021-01-01 ENCOUNTER — Telehealth (INDEPENDENT_AMBULATORY_CARE_PROVIDER_SITE_OTHER): Payer: Medicare Other | Admitting: Psychiatry

## 2021-01-01 ENCOUNTER — Other Ambulatory Visit: Payer: Self-pay | Admitting: Licensed Clinical Social Worker

## 2021-01-01 ENCOUNTER — Other Ambulatory Visit: Payer: Self-pay | Admitting: Hematology and Oncology

## 2021-01-01 ENCOUNTER — Other Ambulatory Visit: Payer: Self-pay

## 2021-01-01 ENCOUNTER — Ambulatory Visit: Admission: RE | Admit: 2021-01-01 | Payer: No Typology Code available for payment source | Source: Ambulatory Visit

## 2021-01-01 ENCOUNTER — Ambulatory Visit: Payer: No Typology Code available for payment source

## 2021-01-01 VITALS — BP 117/52 | HR 54 | Temp 97.9°F | Resp 18

## 2021-01-01 DIAGNOSIS — Z87891 Personal history of nicotine dependence: Secondary | ICD-10-CM | POA: Diagnosis not present

## 2021-01-01 DIAGNOSIS — F4312 Post-traumatic stress disorder, chronic: Secondary | ICD-10-CM | POA: Diagnosis not present

## 2021-01-01 DIAGNOSIS — C770 Secondary and unspecified malignant neoplasm of lymph nodes of head, face and neck: Secondary | ICD-10-CM | POA: Diagnosis not present

## 2021-01-01 DIAGNOSIS — F411 Generalized anxiety disorder: Secondary | ICD-10-CM | POA: Diagnosis not present

## 2021-01-01 DIAGNOSIS — H409 Unspecified glaucoma: Secondary | ICD-10-CM | POA: Insufficient documentation

## 2021-01-01 DIAGNOSIS — E871 Hypo-osmolality and hyponatremia: Secondary | ICD-10-CM | POA: Diagnosis not present

## 2021-01-01 DIAGNOSIS — C01 Malignant neoplasm of base of tongue: Secondary | ICD-10-CM

## 2021-01-01 DIAGNOSIS — Z515 Encounter for palliative care: Secondary | ICD-10-CM | POA: Insufficient documentation

## 2021-01-01 DIAGNOSIS — C160 Malignant neoplasm of cardia: Secondary | ICD-10-CM | POA: Diagnosis not present

## 2021-01-01 DIAGNOSIS — Z5111 Encounter for antineoplastic chemotherapy: Secondary | ICD-10-CM | POA: Diagnosis present

## 2021-01-01 DIAGNOSIS — Z79899 Other long term (current) drug therapy: Secondary | ICD-10-CM | POA: Insufficient documentation

## 2021-01-01 DIAGNOSIS — Z77098 Contact with and (suspected) exposure to other hazardous, chiefly nonmedicinal, chemicals: Secondary | ICD-10-CM | POA: Insufficient documentation

## 2021-01-01 DIAGNOSIS — E119 Type 2 diabetes mellitus without complications: Secondary | ICD-10-CM | POA: Insufficient documentation

## 2021-01-01 DIAGNOSIS — K1233 Oral mucositis (ulcerative) due to radiation: Secondary | ICD-10-CM | POA: Diagnosis not present

## 2021-01-01 DIAGNOSIS — C159 Malignant neoplasm of esophagus, unspecified: Secondary | ICD-10-CM

## 2021-01-01 MED ORDER — CITALOPRAM HYDROBROMIDE 20 MG PO TABS: 30.0000 mg | ORAL_TABLET | Freq: Every day | ORAL | 0 refills | Status: DC

## 2021-01-01 MED ORDER — HEPARIN SOD (PORK) LOCK FLUSH 100 UNIT/ML IV SOLN
500.0000 [IU] | Freq: Once | INTRAVENOUS | Status: AC
  Administered 2021-01-01: 500 [IU] via INTRAVENOUS
  Filled 2021-01-01: qty 5

## 2021-01-01 MED ORDER — DEXAMETHASONE 4 MG PO TABS: 4.0000 mg | ORAL_TABLET | Freq: Two times a day (BID) | ORAL | 0 refills | Status: DC

## 2021-01-01 MED ORDER — SODIUM CHLORIDE 0.9 % IV SOLN
Freq: Once | INTRAVENOUS | Status: AC
  Filled 2021-01-01: qty 250

## 2021-01-01 NOTE — Progress Notes (Signed)
decadron

## 2021-01-01 NOTE — Progress Notes (Signed)
Patient stable at discharge.

## 2021-01-01 NOTE — Patient Instructions (Signed)
Citalopram tablets What is this medicine? CITALOPRAM (sye TAL oh pram) is a medicine for depression. This medicine may be used for other purposes; ask your health care provider or pharmacist if you have questions. COMMON BRAND NAME(S): Celexa What should I tell my health care provider before I take this medicine? They need to know if you have any of these conditions:  bleeding disorders  bipolar disorder or a family history of bipolar disorder  glaucoma  heart disease  history of irregular heartbeat  kidney disease  liver disease  low levels of magnesium or potassium in the blood  receiving electroconvulsive therapy  seizures  suicidal thoughts, plans, or attempt; a previous suicide attempt by you or a family member  take medicines that treat or prevent blood clots  thyroid disease  an unusual or allergic reaction to citalopram, escitalopram, other medicines, foods, dyes, or preservatives  pregnant or trying to become pregnant  breast-feeding How should I use this medicine? Take this medicine by mouth with a glass of water. Follow the directions on the prescription label. You can take it with or without food. Take your medicine at regular intervals. Do not take your medicine more often than directed. Do not stop taking this medicine suddenly except upon the advice of your doctor. Stopping this medicine too quickly may cause serious side effects or your condition may worsen. A special MedGuide will be given to you by the pharmacist with each prescription and refill. Be sure to read this information carefully each time. Talk to your pediatrician regarding the use of this medicine in children. Special care may be needed. Patients over 75 years old may have a stronger reaction and need a smaller dose. Overdosage: If you think you have taken too much of this medicine contact a poison control center or emergency room at once. NOTE: This medicine is only for you. Do not share  this medicine with others. What if I miss a dose? If you miss a dose, take it as soon as you can. If it is almost time for your next dose, take only that dose. Do not take double or extra doses. What may interact with this medicine? Do not take this medicine with any of the following medications:  certain medicines for fungal infections like fluconazole, itraconazole, ketoconazole, posaconazole, voriconazole  cisapride  dronedarone  escitalopram  linezolid  MAOIs like Carbex, Eldepryl, Marplan, Nardil, and Parnate  methylene blue (injected into a vein)  pimozide  thioridazine This medicine may also interact with the following medications:  alcohol  amphetamines  aspirin and aspirin-like medicines  carbamazepine  certain medicines for depression, anxiety, or psychotic disturbances  certain medicines for infections like chloroquine, clarithromycin, erythromycin, furazolidone, isoniazid, pentamidine  certain medicines for migraine headaches like almotriptan, eletriptan, frovatriptan, naratriptan, rizatriptan, sumatriptan, zolmitriptan  certain medicines for sleep  certain medicines that treat or prevent blood clots like dalteparin, enoxaparin, warfarin  cimetidine  diuretics  dofetilide  fentanyl  lithium  methadone  metoprolol  NSAIDs, medicines for pain and inflammation, like ibuprofen or naproxen  omeprazole  other medicines that prolong the QT interval (cause an abnormal heart rhythm)  procarbazine  rasagiline  supplements like St. Keanan's wort, kava kava, valerian  tramadol  tryptophan  ziprasidone This list may not describe all possible interactions. Give your health care provider a list of all the medicines, herbs, non-prescription drugs, or dietary supplements you use. Also tell them if you smoke, drink alcohol, or use illegal drugs. Some items may interact with  your medicine. What should I watch for while using this medicine? Tell your  doctor if your symptoms do not get better or if they get worse. Visit your doctor or health care professional for regular checks on your progress. Because it may take several weeks to see the full effects of this medicine, it is important to continue your treatment as prescribed by your doctor. Patients and their families should watch out for new or worsening thoughts of suicide or depression. Also watch out for sudden changes in feelings such as feeling anxious, agitated, panicky, irritable, hostile, aggressive, impulsive, severely restless, overly excited and hyperactive, or not being able to sleep. If this happens, especially at the beginning of treatment or after a change in dose, call your health care professional. Dennis Bast may get drowsy or dizzy. Do not drive, use machinery, or do anything that needs mental alertness until you know how this medicine affects you. Do not stand or sit up quickly, especially if you are an older patient. This reduces the risk of dizzy or fainting spells. Alcohol may interfere with the effect of this medicine. Avoid alcoholic drinks. Your mouth may get dry. Chewing sugarless gum or sucking hard candy, and drinking plenty of water will help. Contact your doctor if the problem does not go away or is severe. What side effects may I notice from receiving this medicine? Side effects that you should report to your doctor or health care professional as soon as possible:  allergic reactions like skin rash, itching or hives, swelling of the face, lips, or tongue  anxious  black, tarry stools  breathing problems  changes in vision  chest pain  confusion  elevated mood, decreased need for sleep, racing thoughts, impulsive behavior  eye pain  fast, irregular heartbeat  feeling faint or lightheaded, falls  feeling agitated, angry, or irritable  hallucination, loss of contact with reality  loss of balance or coordination  loss of memory  painful or prolonged  erections  restlessness, pacing, inability to keep still  seizures  stiff muscles  suicidal thoughts or other mood changes  trouble sleeping  unusual bleeding or bruising  unusually weak or tired  vomiting Side effects that usually do not require medical attention (report to your doctor or health care professional if they continue or are bothersome):  change in appetite or weight  change in sex drive or performance  dizziness  headache  increased sweating  indigestion, nausea  tremors This list may not describe all possible side effects. Call your doctor for medical advice about side effects. You may report side effects to FDA at 1-800-FDA-1088. Where should I keep my medicine? Keep out of reach of children. Store at room temperature between 15 and 30 degrees C (59 and 86 degrees F). Throw away any unused medicine after the expiration date. NOTE: This sheet is a summary. It may not cover all possible information. If you have questions about this medicine, talk to your doctor, pharmacist, or health care provider.  2021 Elsevier/Gold Standard (2018-10-26 09:05:36)

## 2021-01-01 NOTE — Progress Notes (Signed)
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    Met with patient and wife following radiation.  Patient reports that he can't tolerate solid foods or liquids much at all because of pain on swallowing and burning.  Sips and bites of liquids and soft solids is all he is able to take in.  Requesting fluids.  Reports that Dr Baruch Gouty is giving him a 3 day break from radiation and started decadron.      Medications: magic mouthwash  Labs: reviewed  Anthropometrics:   Reports 188 lb today decreased from 196 lb on 2/9  UBW of 212 on 1/3   NUTRITION DIAGNOSIS:  Inadequate oral intake continues due to radiation side effects   INTERVENTION:  Discussed with Ander Purpura, [facility] regarding availability of getting fluids today for patient. Encouraged patient to discuss regular fluids with Dr Mike Gip on Wednesday at last chemo treatment. Patient verbalized that he will.   Encouraged sips/bites of liquids/soft solids    MONITORING, EVALUATION, GOAL: weight trends, intake   NEXT VISIT: Feb 21 phone call  Neha Waight B. Zenia Resides, [place], [place] Registered Dietitian [phone] (mobile)

## 2021-01-01 NOTE — Progress Notes (Unsigned)
Virtual Visit via Video Note  I connected with Sean Garner. on 01/01/21 at  9:00 AM EST by a video enabled telemedicine application and verified that I am speaking with the correct person using two identifiers. Location Provider Location : [facility] Patient Location : Home  Participants: Patient ,Wife, Provider    I discussed the limitations of evaluation and management by telemedicine and the availability of in person appointments. The patient expressed understanding and agreed to proceed.   I discussed the assessment and treatment plan with the patient. The patient was provided an opportunity to ask questions and all were answered. The patient agreed with the plan and demonstrated an understanding of the instructions.   The patient was advised to call back or seek an in-person evaluation if the symptoms worsen or if the condition fails to improve as anticipated.    Psychiatric Initial Adult Assessment   Patient Identification: Sean Garner. MRN:  [id number] Date of Evaluation:  01/01/2021 Referral Source: Altha Harm NP Chief Complaint:   Chief Complaint    Establish Care     Visit Diagnosis:   ICD-10-CM   1. GAD (generalized anxiety disorder)  F41.1 citalopram (CELEXA) 20 MG tablet  2. Chronic post-traumatic stress disorder (PTSD)  F43.12 citalopram (CELEXA) 20 MG tablet  3. High risk medication use  Z79.899 EKG 12-Lead     History of Present Illness:     Sean Garner. 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  Patient also has a history of PTSD and anxiety disorder.  Patient as well as spouse participated in the evaluation today.  Patient reports he was diagnosed with cancer as noted above in  December 2021.  Patient reports he is currently receiving chemotherapy as well as radiation treatments.  He is going to complete radiation therapy next week.  Patient reports it is challenging to go through these treatments since he struggles with loss of sense of taste, decreased appetite due to the same, burning sensation in his throat that radiates down.  Patient reports this does make him anxious and  he wants to just get through this.  He constantly worries about his treatments and the outcome.  He reports he was told by his providers that he has good prognosis and he is responding to treatment.  Patient however reports he still worries a lot.  Patient also feels sad off and on.  Patient reports he however is able to still enjoy the things that he normally enjoy like watching TV, spending time with family and so on.  Patient does report sleep problems, had restless sleep however he was started on trazodone and he is currently responding to be.  That has definitely helped.  He reports his antidepressant, Celexa dosage was increased to 20 mg a month ago.  Patient reports however he has not noticed much benefit from it with regards to his anxiety.  Patient denies any suicidality, homicidality or perceptual disturbances.  Patient does report a history of PTSD.  He is a [place] veteran.  Patient reports he was in combat and was diagnosed with PTSD several years ago.  Patient does have a history of flashbacks, nightmares, intrusive memories however he currently reports his symptoms are stable.  It currently does not bother him much.  Patient does  report a history of claustrophobia, reports he was been prescribed Xanax as needed.  He does not like tight spaces especially when he has to get his PET scan, he needs it.  Patient currently uses Xanax prior to his radiation treatments.  He does not use it otherwise.  Patient reports good support system from his spouse.  Patient denies any other concerns  today.    Associated Signs/Symptoms: Depression Symptoms:  depressed mood, anxiety, decreased appetite, (Hypo) Manic Symptoms:  Denies Anxiety Symptoms:  Excessive Worry, Psychotic Symptoms:  Denies PTSD Symptoms: Had a traumatic exposure:  as noted above - chronic  Past Psychiatric History: Patient reports he used to be under the care of Dr. Weber Cooks at [facility] several years ago. This may have been 10 years ago. Patient reports a history of PTSD in the past. Denies any history of inpatient mental health admissions. Denies suicide attempts.  Previous Psychotropic Medications: Yes Past trials of medication like Celexa.  Substance Abuse History in the last 12 months:  No.  Consequences of Substance Abuse: Negative  Past Medical History:  Past Medical History:  Diagnosis Date  . Anxiety   . Cancer [facility])    Head/throat Cancer at the base of the tongue  . Cataract    lt eye repair; present in rt eye but not ripe yet.  . Claustrophobia   . Diabetes mellitus without complication ([place])   . GERD (gastroesophageal reflux disease)   . Glaucoma    using gtts for this.  . Hypercholesterolemia   . Hypertension     Past Surgical History:  Procedure Laterality Date  . CATARACT EXTRACTION W/PHACO Left 12/03/2017   Procedure: CATARACT EXTRACTION PHACO AND INTRAOCULAR LENS PLACEMENT ([place]) COMPLICATED DIABETIC LEFT;  Surgeon: Leandrew Koyanagi, MD;  Location: [place];  Service: Ophthalmology;  Laterality: Left;  Diabetic - oral meds  . CHOLECYSTECTOMY    . COLONOSCOPY  2016  . PORTA CATH INSERTION N/A 11/13/2020   Procedure: PORTA CATH INSERTION;  Surgeon: Algernon Huxley, MD;  Location: [facility];  Service: Cardiovascular;  Laterality: N/A;    Family Psychiatric History: Patient denies any history of mental health problems in his family.  Family History:  Family History  Problem Relation Age of Onset  . Cancer Mother   . Colon  cancer Neg Hx   . Colon polyps Neg Hx   . Esophageal cancer Neg Hx   . Rectal cancer Neg Hx   . Stomach cancer Neg Hx     Social History:   Social History   Socioeconomic History  . Marital status: Married    Spouse name: Not on file  . Number of children: Not on file  . Years of education: Not on file  . Highest education level: Not on file  Occupational History  . Not on file  Tobacco Use  . Smoking status: Former Smoker    Quit date: 1986    Years since quitting: 36.1  . Smokeless tobacco: Never Used  Vaping Use  . Vaping Use: Never used  Substance and Sexual Activity  . Alcohol use: Not Currently    Comment: occasional - 1 glass wine/month  . Drug use: Never  . Sexual activity: Not on file  Other Topics Concern  . Not on file  Social History Narrative  . Not on file   Social Determinants of Health   Financial Resource Strain: Not on file  Food Insecurity: Not on file  Transportation Needs: Not  on file  Physical Activity: Not on file  Stress: Not on file  Social Connections: Not on file    Additional Social History: Patient was born in [place]. Patient was raised by her his parents. His parents are deceased. Patient graduated high school and did 2 years of college. He majored in business. Patient was in the [employer] for 2 years-[place]-honorable discharge,E5, rank. Patient is married. Patient denies having any children. Patient denies any history of legal problems. Reports wife is supportive.  Currently lives with his wife in [place].  Allergies:  No Known Allergies  Metabolic Disorder Labs: Lab Results  Component Value Date   HGBA1C 7.4 (H) 09/29/2013   No results found for: PROLACTIN No results found for: CHOL, TRIG, HDL, CHOLHDL, VLDL, LDLCALC Lab Results  Component Value Date   TSH 2.077 11/27/2020    Therapeutic Level Labs: No results found for: LITHIUM No results found for: CBMZ No results found for: VALPROATE  Current  Medications: Current Outpatient Medications  Medication Sig Dispense Refill  . ALPRAZolam (XANAX) 0.25 MG tablet Take 1 tablet (0.25 mg total) by mouth at bedtime as needed for anxiety. 60 tablet 0  . brimonidine-timolol (COMBIGAN) 0.2-0.5 % ophthalmic solution Place 1 drop into the left eye daily.    . calcium carbonate (OSCAL) 1500 (600 Ca) MG TABS tablet Take by mouth 2 (two) times daily with a meal.    . esomeprazole (NEXIUM) 20 MG capsule Take 40 mg by mouth daily at 12 noon.    . fluticasone (FLONASE) 50 MCG/ACT nasal spray Place into both nostrils.    Marland Kitchen glipiZIDE (GLUCOTROL XL) 5 MG 24 hr tablet Take 5 mg by mouth 2 (two) times daily.  1  . glucose blood test strip CHECK FASTING BLOOD SUGAR EVERY MORNING. (NEED SIGNATURE)    . lidocaine-prilocaine (EMLA) cream Apply to affected area once 30 g 3  . losartan (COZAAR) 100 MG tablet Take 100 mg by mouth daily.    . metFORMIN (GLUCOPHAGE-XR) 500 MG 24 hr tablet Take 500 mg by mouth 2 (two) times daily. Takes 2 tablets and 1 tablet in the pm    . simvastatin (ZOCOR) 40 MG tablet Take by mouth.    . sucralfate (CARAFATE) 1 g tablet Take 0.5 tablets (0.5 g total) by mouth 3 (three) times daily. Dissolve in warm water, swish and swallow 60 tablet 1  . traZODone (DESYREL) 50 MG tablet Take 0.5-1 tablets (25-50 mg total) by mouth at bedtime as needed for sleep. 30 tablet 2  . VITAMIN D PO Take 800 Units by mouth daily.    Marland Kitchen ALPRAZolam (XANAX) 0.5 MG tablet Take 1 tablet (0.5 mg total) by mouth 3 (three) times daily as needed for anxiety. (Patient not taking: No sig reported) 45 tablet 0  . citalopram (CELEXA) 20 MG tablet Take 1.5 tablets (30 mg total) by mouth daily with breakfast. 45 tablet 0  . dexamethasone (DECADRON) 4 MG tablet 2 TABS BY MOUTH DAILY. TAKE DAILY X 3 DAYS STARTING THE DAY AFTER CHEMOTHERAPY. TAKE WITH FOOD. 30 tablet 1  . dexamethasone (DECADRON) 4 MG tablet Take 1 tablet (4 mg total) by mouth 2 (two) times daily with a meal. 60  tablet 0  . ondansetron (ZOFRAN) 8 MG tablet Take 1 tablet (8 mg total) by mouth 2 (two) times daily as needed. Start on the third day after cisplatin chemotherapy. (Patient not taking: Reported on 01/01/2021) 30 tablet 1  . prochlorperazine (COMPAZINE) 10 MG tablet Take 1 tablet (10  mg total) by mouth every 6 (six) hours as needed (Nausea or vomiting). (Patient not taking: No sig reported) 30 tablet 1   Current Facility-Administered Medications  Medication Dose Route Frequency Provider Last Rate Last Admin  . 0.9 %  sodium chloride infusion  500 mL Intravenous Once Pyrtle, Lajuan Lines, MD       Facility-Administered Medications Ordered in Other Visits  Medication Dose Route Frequency Provider Last Rate Last Admin  . 0.9 % NaCl with KCl 20 mEq/ L  infusion   Intravenous Once Corcoran, Melissa C, MD      . CISplatin (PLATINOL) 87 mg in sodium chloride 0.9 % 250 mL chemo infusion  40 mg/m2 (Order-Specific) Intravenous Once Nolon Stalls C, MD      . dexamethasone (DECADRON) 10 mg in sodium chloride 0.9 % 50 mL IVPB  10 mg Intravenous Once Corcoran, Melissa C, MD      . fosaprepitant (EMEND) 150 mg in sodium chloride 0.9 % 145 mL IVPB  150 mg Intravenous Once Corcoran, Melissa C, MD      . magnesium sulfate IVPB 2 g 50 mL  2 g Intravenous Once Lequita Asal, MD        Musculoskeletal: Strength & Muscle Tone: [place]: UTA Patient leans: N/A  Psychiatric Specialty Exam: Review of Systems  Constitutional: Positive for appetite change and fatigue.  HENT: Positive for sore throat (due to radiation treatment).   Psychiatric/Behavioral: Positive for dysphoric mood and sleep disturbance (improving). The patient is nervous/anxious.   All other systems reviewed and are negative.   There were no vitals taken for this visit.There is no height or weight on file to calculate BMI.  General Appearance: Casual  Eye Contact:  Fair  Speech:  Clear and Coherent  Volume:  Normal  Mood:   Anxious and Dysphoric  Affect:  Congruent  Thought Process:  Goal Directed and Descriptions of Associations: Intact  Orientation:  Full (Time, Place, and Person)  Thought Content:  Logical  Suicidal Thoughts:  No  Homicidal Thoughts:  No  Memory:  Immediate;   Fair Recent;   Fair Remote;   Fair  Judgement:  Fair  Insight:  Fair  Psychomotor Activity:  Normal  Concentration:  Concentration: Fair and Attention Span: Fair  Recall:  [employer] of Knowledge:Fair  Language: Fair  Akathisia:  No  Handed:  Right  AIMS (if indicated):  UTA  Assets:  Communication Skills Desire for Improvement Housing Social Support  ADL's:  Intact  Cognition: WNL  Sleep:  Improving   Screenings: GAD-7   Flowsheet Row Video Visit from 01/01/2021 in [facility]  Total GAD-7 Score 12    PHQ2-9   Flowsheet Row Video Visit from 01/01/2021 in [place] Nutrition from 07/02/2018 in [place]  PHQ-2 Total Score 3 1  PHQ-9 Total Score 9 -    Flowsheet Row Video Visit from 01/01/2021 in [facility]  C-SSRS RISK CATEGORY No Risk      Assessment and Plan: Sean Lema. 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 Patient is currently undergoing chemotherapy as well as radiation. Patient with anxiety symptoms, will benefit from the following plan.  Plan GAD-unstable GAD 7 equals 12 Increase Celexa to 30 mg p.o. daily  Continue Xanax as prescribed, he takes it 15 minutes before his radiation therapy. He has been limiting use. Patient advised to use  Xanax up to 4-5 times a week as needed for the next 2 to 3 weeks for severe anxiety attacks. Provided medication education about  long-term side effects of benzodiazepine therapy. Referral for CBT  Chronic PTSD-stable Continue trazodone 50 mg p.o. nightly for sleep. We'll monitor closely  High risk medication use-patient will benefit from EKG. Will order EKG today.  I have reviewed the following labs-TSH dated 11/27/2020-2.077-within normal limits.  Collateral information obtained from wife.  I have reviewed notes from Nolon Stalls, MD-dated 12/20/2020 as well as Mr. 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  I have spent atleast 60 minutes face to face by video with patient today. More than 50 % of the time was spent for preparing to see the patient ( e.g., review of test, records ), obtaining and to review and separately obtained history , ordering medications and test ,psychoeducation and supportive psychotherapy and care coordination,as well as documenting clinical information in electronic health record,interpreting and communication of test results. This note was generated in part or whole with voice recognition software. Voice recognition is usually quite accurate but there are transcription errors that can and very often do occur. I apologize for any typographical errors that were not detected and corrected.       Ursula Alert, MD 2/15/20228:20 AM

## 2021-01-02 ENCOUNTER — Ambulatory Visit: Payer: No Typology Code available for payment source

## 2021-01-02 ENCOUNTER — Other Ambulatory Visit: Payer: Self-pay | Admitting: *Deleted

## 2021-01-02 NOTE — Progress Notes (Signed)
[facility]  [address], [address] [place], [place] [address] Phone: [phone]  Fax: [phone]   Clinic Day:  01/03/2021  Referring physician: Derinda Late, MD  Chief Complaint: Sean Bridgeman. is a 75 y.o. 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  HPI:  The patient was last seen in the medical oncology clinic on 12/27/2020. At that time, he felt fatigued.  He noted change in taste and difficulty swallowing.  Exam revealed radiation changes.  Right neck mass was 1.8 x 1.3 cm.  Hematocrit was 33.9, hemoglobin 11.9, MCV 86.3, platelets 113,000, WBC 6,900 with an ANC of 6100. Sodium was 130. Magnesium was 1.6. He received 1 gm of magnesium.  He received week #6 cisplatin.  The patient is currently on a break from radiation right now due to the changes in his mouth and throat. His last treatment was on 12/29/2020. He is going to see Dr. Baruch Gouty again tomorrow.  The patient saw Jennet Maduro, RD on 01/01/2021. He reported that he could not tolerate solid foods or liquids much at all. He stated that Dr. Baruch Gouty was giving him a 3 day break from radiation and starting him on Decadron. He received IV fluids.  The patient saw Dr. Shea Evans on 01/01/2021. The patient reported being anxious about his treatments. He was to increase Celexa to 30 mg daily. He was advised to use Xanax up to 4-5 x per week for the next 2-3 weeks for severe anxiety attacks. He was referred for cognitive behavioral therapy.  During the interim, he has been "ok". He has had difficulty eating and drinking over the past couple of weeks. He can barely drink Boost right now because it is so thick. Yesterday, he ate a piece of toast with butter and jelly, a small amount of cereal, and part of a Boost. He has been eating some fruit smoothies and ice  cream at night. He denies dizziness, lightheadedness, nausea, and vomiting. If it wasn't for his throat, he would feel great.   He dose of citalopram has been increased from 20 to 30 mg/day.  He is okay with getting IV fluids everyday. He felt much better yesterday after getting IV fluids.   Past Medical History:  Diagnosis Date  . Anxiety   . Cancer [facility])    Head/throat Cancer at the base of the tongue  . Cataract    lt eye repair; present in rt eye but not ripe yet.  . Claustrophobia   . Diabetes mellitus without complication ([place])   . GERD (gastroesophageal reflux disease)   . Glaucoma    using gtts for this.  . Hypercholesterolemia   . Hypertension     Past Surgical History:  Procedure Laterality Date  . CATARACT EXTRACTION W/PHACO Left 12/03/2017   Procedure: CATARACT EXTRACTION PHACO AND INTRAOCULAR LENS PLACEMENT ([place]) COMPLICATED DIABETIC LEFT;  Surgeon: Leandrew Koyanagi, MD;  Location: [place];  Service: Ophthalmology;  Laterality: Left;  Diabetic - oral meds  . CHOLECYSTECTOMY    . COLONOSCOPY  2016  . PORTA CATH INSERTION N/A 11/13/2020   Procedure: PORTA CATH INSERTION;  Surgeon: Algernon Huxley, MD;  Location: [place] [facility];  Service: Cardiovascular;  Laterality: N/A;    Family History  Problem Relation Age of Onset  . Cancer Mother   . Colon cancer Neg Hx   .  Colon polyps Neg Hx   . Esophageal cancer Neg Hx   . Rectal cancer Neg Hx   . Stomach cancer Neg Hx     Social History:  reports that he quit smoking about 36 years ago. He has never used smokeless tobacco. He reports previous alcohol use. He reports that he does not use drugs. He quit smoking cold [place] in 1986. He smoked 2 packs per day for 20 years. He drinks a glass of wine or a Margarita occasionally. 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 He was exposed to [employer]. He worked with a Geologist, engineering for 34 years. The patient is accompanied by his wife  today.  Allergies: No Known Allergies  Current Medications: Current Outpatient Medications  Medication Sig Dispense Refill  . brimonidine-timolol (COMBIGAN) 0.2-0.5 % ophthalmic solution Place 1 drop into the left eye daily.    . calcium carbonate (OSCAL) 1500 (600 Ca) MG TABS tablet Take by mouth 2 (two) times daily with a meal.    . citalopram (CELEXA) 20 MG tablet Take 1.5 tablets (30 mg total) by mouth daily with breakfast. 45 tablet 0  . dexamethasone (DECADRON) 4 MG tablet 2 TABS BY MOUTH DAILY. TAKE DAILY X 3 DAYS STARTING THE DAY AFTER CHEMOTHERAPY. TAKE WITH FOOD. 30 tablet 1  . dexamethasone (DECADRON) 4 MG tablet Take 1 tablet (4 mg total) by mouth 2 (two) times daily with a meal. 60 tablet 0  . esomeprazole (NEXIUM) 20 MG capsule Take 40 mg by mouth daily at 12 noon.    . fluticasone (FLONASE) 50 MCG/ACT nasal spray Place into both nostrils.    Marland Kitchen glipiZIDE (GLUCOTROL XL) 5 MG 24 hr tablet Take 5 mg by mouth 2 (two) times daily.  1  . glucose blood test strip CHECK FASTING BLOOD SUGAR EVERY MORNING. (NEED SIGNATURE)    . lidocaine-prilocaine (EMLA) cream Apply to affected area once 30 g 3  . losartan (COZAAR) 100 MG tablet Take 100 mg by mouth daily.    . metFORMIN (GLUCOPHAGE-XR) 500 MG 24 hr tablet Take 500 mg by mouth 2 (two) times daily. Takes 2 tablets and 1 tablet in the pm    . simvastatin (ZOCOR) 40 MG tablet Take by mouth.    . sucralfate (CARAFATE) 1 g tablet Take 0.5 tablets (0.5 g total) by mouth 3 (three) times daily. Dissolve in warm water, swish and swallow 60 tablet 1  . traZODone (DESYREL) 50 MG tablet Take 0.5-1 tablets (25-50 mg total) by mouth at bedtime as needed for sleep. 30 tablet 2  . VITAMIN D PO Take 800 Units by mouth daily.    Marland Kitchen ALPRAZolam (XANAX) 0.25 MG tablet Take 1 tablet (0.25 mg total) by mouth at bedtime as needed for anxiety. (Patient not taking: Reported on 01/03/2021) 60 tablet 0  . ALPRAZolam (XANAX) 0.5 MG tablet Take 1 tablet (0.5 mg total)  by mouth 3 (three) times daily as needed for anxiety. (Patient not taking: No sig reported) 45 tablet 0  . ondansetron (ZOFRAN) 8 MG tablet Take 1 tablet (8 mg total) by mouth 2 (two) times daily as needed. Start on the third day after cisplatin chemotherapy. (Patient not taking: No sig reported) 30 tablet 1  . prochlorperazine (COMPAZINE) 10 MG tablet Take 1 tablet (10 mg total) by mouth every 6 (six) hours as needed (Nausea or vomiting). (Patient not taking: No sig reported) 30 tablet 1   Current Facility-Administered Medications  Medication Dose Route Frequency Provider  Last Rate Last Admin  . 0.9 %  sodium chloride infusion  500 mL Intravenous Once Pyrtle, Lajuan Lines, MD       Facility-Administered Medications Ordered in Other Visits  Medication Dose Route Frequency Provider Last Rate Last Admin  . 0.9 % NaCl with KCl 20 mEq/ L  infusion   Intravenous Once Reyana Leisey C, MD      . CISplatin (PLATINOL) 87 mg in sodium chloride 0.9 % 250 mL chemo infusion  40 mg/m2 (Order-Specific) Intravenous Once Nolon Stalls C, MD      . dexamethasone (DECADRON) 10 mg in sodium chloride 0.9 % 50 mL IVPB  10 mg Intravenous Once Aaryanna Hyden C, MD      . fosaprepitant (EMEND) 150 mg in sodium chloride 0.9 % 145 mL IVPB  150 mg Intravenous Once Timberlee Roblero C, MD      . magnesium sulfate IVPB 2 g 50 mL  2 g Intravenous Once Lequita Asal, MD        Review of Systems  Constitutional: Positive for weight loss (5 lbs). Negative for chills, diaphoresis, fever and malaise/fatigue.       Feels "ok".  HENT: Positive for hearing loss and sore throat. Negative for congestion, ear discharge, ear pain, nosebleeds, sinus pain and tinnitus.   Eyes: Negative for blurred vision.  Respiratory: Negative for cough, hemoptysis, sputum production and shortness of breath.   Cardiovascular: Negative for chest pain, palpitations and leg swelling.  Gastrointestinal: Negative for abdominal pain, blood in  stool, constipation, diarrhea, heartburn, melena, nausea and vomiting.       Poor oral intake due to radiation side effects.  Genitourinary: Negative for dysuria, frequency, hematuria and urgency.  Musculoskeletal: Negative for back pain, joint pain, myalgias and neck pain.  Skin: Negative for itching and rash.  Neurological: Negative for dizziness, tingling, sensory change, weakness and headaches.  Endo/Heme/Allergies: Does not bruise/bleed easily.  Psychiatric/Behavioral: Negative for depression and memory loss. The patient is not nervous/anxious (on citalopram and Xanax) and does not have insomnia.   All other systems reviewed and are negative.  Performance status (ECOG): 2  Vitals Blood pressure 139/60, pulse (!) 54, temperature (!) 96 F (35.6 C), temperature source Tympanic, resp. rate 16, weight 191 lb 9.3 oz (86.9 kg), SpO2 100 %.   Physical Exam Vitals and nursing note reviewed.  Constitutional:      General: He is not in acute distress.    Appearance: He is not diaphoretic.  HENT:     Head: Normocephalic and atraumatic.     Comments: Gray hair.    Mouth/Throat:     Mouth: Mucous membranes are moist.     Comments: Mild oral changes.  Unable to visualize deep posteriorly. Eyes:     General: No scleral icterus.    Extraocular Movements: Extraocular movements intact.     Conjunctiva/sclera: Conjunctivae normal.     Pupils: Pupils are equal, round, and reactive to light.     Comments: Blue eyes.  Neck:     Comments: 1.5 x 1.2 cm right neck mass. Cardiovascular:     Rate and Rhythm: Normal rate and regular rhythm.     Heart sounds: Normal heart sounds. No murmur heard.   Pulmonary:     Effort: Pulmonary effort is normal. No respiratory distress.     Breath sounds: Normal breath sounds. No wheezing or rales.  Chest:     Chest wall: No tenderness.  Breasts:     Right: No axillary adenopathy  or supraclavicular adenopathy.     Left: No axillary adenopathy or  supraclavicular adenopathy.    Abdominal:     General: Bowel sounds are normal. There is no distension.     Palpations: Abdomen is soft. There is no mass.     Tenderness: There is no abdominal tenderness. There is no guarding or rebound.  Musculoskeletal:        General: Tenderness (neck) present. No swelling. Normal range of motion.     Cervical back: Normal range of motion and neck supple.  Lymphadenopathy:     Head:     Right side of head: No preauricular, posterior auricular or occipital adenopathy.     Left side of head: No preauricular, posterior auricular or occipital adenopathy.     Upper Body:     Right upper body: No supraclavicular or axillary adenopathy.     Left upper body: No supraclavicular or axillary adenopathy.     Lower Body: No right inguinal adenopathy. No left inguinal adenopathy.  Skin:    General: Skin is warm and dry.     Comments: Erythema and mild desquamation laterally on the neck.  Neurological:     Mental Status: He is alert and oriented to person, place, and time.  Psychiatric:        Behavior: Behavior normal.        Thought Content: Thought content normal.        Judgment: Judgment normal.    Appointment on 01/03/2021  Component Date Value Ref Range Status  . Magnesium 01/03/2021 1.8  1.7 - 2.4 mg/dL Final   Performed at [facility], [address] [place], [place] [address]  . Sodium 01/03/2021 132* 135 - 145 mmol/L Final  . Potassium 01/03/2021 4.0  3.5 - 5.1 mmol/L Final  . Chloride 01/03/2021 95* 98 - 111 mmol/L Final  . CO2 01/03/2021 25  22 - 32 mmol/L Final  . Glucose, Bld 01/03/2021 169* 70 - 99 mg/dL Final   Glucose reference range applies only to samples taken after fasting for at least 8 hours.  . BUN 01/03/2021 22  8 - 23 mg/dL Final  . Creatinine, Ser 01/03/2021 1.04  0.61 - 1.24 mg/dL Final  . Calcium 01/03/2021 9.4  8.9 - 10.3 mg/dL Final  . Total Protein 01/03/2021 6.5  6.5 - 8.1 g/dL Final  . Albumin  01/03/2021 3.7  3.5 - 5.0 g/dL Final  . AST 01/03/2021 18  15 - 41 U/L Final  . ALT 01/03/2021 22  0 - 44 U/L Final  . Alkaline Phosphatase 01/03/2021 44  38 - 126 U/L Final  . Total Bilirubin 01/03/2021 0.8  0.3 - 1.2 mg/dL Final  . GFR, Estimated 01/03/2021 >60  >60 mL/min Final   Comment: (NOTE) Calculated using the CKD-EPI Creatinine Equation (2021)   . Anion gap 01/03/2021 12  5 - 15 Final   Performed at [facility], [address], [place], [place] [address]  . WBC 01/03/2021 6.6  4.0 - 10.5 K/uL Final  . RBC 01/03/2021 3.73* 4.22 - 5.81 MIL/uL Final  . Hemoglobin 01/03/2021 11.3* 13.0 - 17.0 g/dL Final  . HCT 01/03/2021 32.1* 39.0 - 52.0 % Final  . MCV 01/03/2021 86.1  80.0 - 100.0 fL Final  . MCH 01/03/2021 30.3  26.0 - 34.0 pg Final  . MCHC 01/03/2021 35.2  30.0 - 36.0 g/dL Final  . RDW 01/03/2021 13.3  11.5 - 15.5 % Final  . Platelets 01/03/2021 101*  150 - 400 K/uL Final   Comment: Immature Platelet Fraction may be clinically indicated, consider ordering this additional test [id number]   . nRBC 01/03/2021 0.0  0.0 - 0.2 % Final  . Neutrophils Relative % 01/03/2021 90  % Final  . Neutro Abs 01/03/2021 5.9  1.7 - 7.7 K/uL Final  . Lymphocytes Relative 01/03/2021 4  % Final  . Lymphs Abs 01/03/2021 0.3* 0.7 - 4.0 K/uL Final  . Monocytes Relative 01/03/2021 5  % Final  . Monocytes Absolute 01/03/2021 0.3  0.1 - 1.0 K/uL Final  . Eosinophils Relative 01/03/2021 0  % Final  . Eosinophils Absolute 01/03/2021 0.0  0.0 - 0.5 K/uL Final  . Basophils Relative 01/03/2021 0  % Final  . Basophils Absolute 01/03/2021 0.0  0.0 - 0.1 K/uL Final  . Immature Granulocytes 01/03/2021 1  % Final  . Abs Immature Granulocytes 01/03/2021 0.06  0.00 - 0.07 K/uL Final   Performed at [facility], [address] [place], [place] [address]    Assessment:  Sean Garner. is a 75 y.o. 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  He presented with right neck adenopathy.    Neck soft tissue CT on 09/28/2020 revealed a 3.5 x 3.0 x 4.8 cm right neck mass most c/w enlarged lymph node. There was a 14 mm enhancing mass in the right base of tongue.   PET scan on 10/25/2020 revealed right tongue base primary with ipsilateral level 2 nodal metastasis. There were no findings of extracervical metastatic disease. There was focal hypermetabolism in the distal esophagus, with possible concurrent soft tissue density lesion. Recommended correlation with endoscopy. There was vague right lower lobe low-level hypermetabolism and ground-glass nodularity, favored minimal infection or aspiration. Incidental findings included aortic atherosclerosis, coronary artery atherosclerosis, emphysema, a tiny hiatal hernia, and prostatomegaly.  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  Speech discrimination scores were 100% in each ear.  He is s/p week #6 cisplatin and concurrent radiation (11/20/2020 - 12/27/2020).  He is tolerating treatment well.  He has radiation induced oral changes affecting taste and swallowing.  Radiation is currently on hold.   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  Upper endoscopy on 11/03/2020 revealed a malignant esophageal tumor at the gastroesophageal junction. There were esophageal mucosal changes c/w short-segment Barrett's esophagus. There was a 2 cm hiatal hernia. There was gastritis.  Pathology in the esophagus at 36 cm revealed intramucosal adenocarcinoma arising in a background of high grade dysplasia and intestinal metaplasia; pathology at 38 cm revealed adenocarcinoma.  EUS on 11/15/2020 revealed early stage adenocarcinoma arising from Barrett's esophagus.  Lesion was T1bN0 and  non-obstructing.  He has PTSD and clastrophobia.  Anxiety is relieved by alprazolam 0.25 mg prior to radiation.  He is on citalopram 20 mg a day and trazodone at night.  Symptomatically, he has progressive difficulty swallowing.  He has lost 5 pounds.  Exam reveals radiation changes.  Neck mass is 1.5 x 1.2 cm.  Plan: 1.   Labs today: CBC with diff, CMP, Mg. 2. Clinical stage I right base of tongue carcinoma Clinical stage I. 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 PET scan on 10/25/2020 revealed a right base of tongue primary with ipsilateral level 2 nodal metastasis. No evidence  of distant metastasis. Plan: cisplatin 40 mg/m2 weekly x 7 with radiation.             He began concurrent radiation and cisplatin on 11/20/2020.  Clinically, he is struggling with fluid and caloric intake secondary to radiation changes.   Neck adenopathy has decreased in size.  Radiation has been on hold since 01/01/2021.  Labs reviewed.  Postpone week #7 cis-platinum as radiation on hold.   He denies any nausea or vomiting.  Discuss symptom management.  He has antiemetics at home to use on a prn bases.  Interventions are adequate.      He declines pain medication. 3. 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 EUS on 11/15/2020 revealed a T1bN0 lesion.  He may be a candidate for endoscopic resection followed by ablation or esophagectomy.  Anticipate follow-up appointment at [facility]. 4.   Mucositis  Patient has progressive radiation-induced mucositis and esophagitis.  Radiation is currently on hold.  He has been prescribed Carafate and Magic mouthwash.  He has been seen by nutrition who recommends Ensure Complete  and Boost Plus and blended food.   He is tolerating ice cream and some soft foods.  Discuss plan for daily IV fluids.  Patient in agreement. 5. Anxiety  Anxiety is well controlled on his current regimen.  Review notes from behavioral health on 01/01/2021.  Citalopram was increased to 30 mg a day 6.   Hyponatremia             Sodium 132.             Encourage fluids with electrolytes.  Patient to begin daily IV fluids secondary to decreased oral intake. 7.   Hypomagnesemia, resolved  Magnesium 1.8 today.  Continue to monitor. 8.   No cisplatin today. 9.   IVF fluids today (1 liter over 2 hours). 10.   RTC daily (Thursday and Friday this week; Monday - Thursday next week) for IVF.  Fridays fluid in [place]. 11.   RTC on 01/08/2021 for MD assessment, labs (CBC with diff, CMP, Mg) and +/- cycle #7 cisplatin.  I discussed the assessment and treatment plan with the patient.  The patient was provided an opportunity to ask questions and all were answered.  The patient agreed with the plan and demonstrated an understanding of the instructions.  The patient was advised to call back if the symptoms worsen or if the condition fails to improve as anticipated.  I provided 17 minutes of face-to-face time during this this encounter and > 50% was spent counseling as documented under my assessment and plan.  An additional 5 minutes were spent reviewing her chart (Epic and Care Everywhere) including notes, labs, and imaging studies.    Cymone Yeske C. Mike Gip, MD, PhD    01/03/2021, 9:59 AM   I, Mirian Mo Tufford, am acting as Education administrator for [employer]. Mike Gip, MD, PhD.  I, Yohannes Waibel C. Mike Gip, MD, have reviewed the above documentation for accuracy and completeness, and I agree with the above.

## 2021-01-03 ENCOUNTER — Telehealth: Payer: Self-pay

## 2021-01-03 ENCOUNTER — Other Ambulatory Visit: Payer: Self-pay

## 2021-01-03 ENCOUNTER — Ambulatory Visit: Payer: No Typology Code available for payment source

## 2021-01-03 ENCOUNTER — Inpatient Hospital Stay: Payer: No Typology Code available for payment source

## 2021-01-03 ENCOUNTER — Inpatient Hospital Stay (HOSPITAL_BASED_OUTPATIENT_CLINIC_OR_DEPARTMENT_OTHER): Payer: No Typology Code available for payment source | Admitting: Hematology and Oncology

## 2021-01-03 VITALS — BP 139/60 | HR 54 | Temp 96.0°F | Resp 16 | Wt 191.6 lb

## 2021-01-03 DIAGNOSIS — K1233 Oral mucositis (ulcerative) due to radiation: Secondary | ICD-10-CM

## 2021-01-03 DIAGNOSIS — C01 Malignant neoplasm of base of tongue: Secondary | ICD-10-CM

## 2021-01-03 DIAGNOSIS — Z5111 Encounter for antineoplastic chemotherapy: Secondary | ICD-10-CM | POA: Diagnosis not present

## 2021-01-03 DIAGNOSIS — T66XXXA Radiation sickness, unspecified, initial encounter: Secondary | ICD-10-CM

## 2021-01-03 DIAGNOSIS — E871 Hypo-osmolality and hyponatremia: Secondary | ICD-10-CM

## 2021-01-03 DIAGNOSIS — F419 Anxiety disorder, unspecified: Secondary | ICD-10-CM | POA: Diagnosis not present

## 2021-01-03 DIAGNOSIS — E860 Dehydration: Secondary | ICD-10-CM

## 2021-01-03 DIAGNOSIS — C159 Malignant neoplasm of esophagus, unspecified: Secondary | ICD-10-CM | POA: Diagnosis not present

## 2021-01-03 LAB — CBC WITH DIFFERENTIAL/PLATELET
Abs Immature Granulocytes: 0.06 10*3/uL (ref 0.00–0.07)
Basophils Absolute: 0 10*3/uL (ref 0.0–0.1)
Basophils Relative: 0 %
Eosinophils Absolute: 0 10*3/uL (ref 0.0–0.5)
Eosinophils Relative: 0 %
HCT: 32.1 % — ABNORMAL LOW (ref 39.0–52.0)
Hemoglobin: 11.3 g/dL — ABNORMAL LOW (ref 13.0–17.0)
Immature Granulocytes: 1 %
Lymphocytes Relative: 4 %
Lymphs Abs: 0.3 10*3/uL — ABNORMAL LOW (ref 0.7–4.0)
MCH: 30.3 pg (ref 26.0–34.0)
MCHC: 35.2 g/dL (ref 30.0–36.0)
MCV: 86.1 fL (ref 80.0–100.0)
Monocytes Absolute: 0.3 10*3/uL (ref 0.1–1.0)
Monocytes Relative: 5 %
Neutro Abs: 5.9 10*3/uL (ref 1.7–7.7)
Neutrophils Relative %: 90 %
Platelets: 101 10*3/uL — ABNORMAL LOW (ref 150–400)
RBC: 3.73 MIL/uL — ABNORMAL LOW (ref 4.22–5.81)
RDW: 13.3 % (ref 11.5–15.5)
WBC: 6.6 10*3/uL (ref 4.0–10.5)
nRBC: 0 % (ref 0.0–0.2)

## 2021-01-03 LAB — COMPREHENSIVE METABOLIC PANEL
ALT: 22 U/L (ref 0–44)
AST: 18 U/L (ref 15–41)
Albumin: 3.7 g/dL (ref 3.5–5.0)
Alkaline Phosphatase: 44 U/L (ref 38–126)
Anion gap: 12 (ref 5–15)
BUN: 22 mg/dL (ref 8–23)
CO2: 25 mmol/L (ref 22–32)
Calcium: 9.4 mg/dL (ref 8.9–10.3)
Chloride: 95 mmol/L — ABNORMAL LOW (ref 98–111)
Creatinine, Ser: 1.04 mg/dL (ref 0.61–1.24)
GFR, Estimated: >60 mL/min (ref >60)
Glucose, Bld: 169 mg/dL — ABNORMAL HIGH (ref 70–99)
Potassium: 4 mmol/L (ref 3.5–5.1)
Sodium: 132 mmol/L — ABNORMAL LOW (ref 135–145)
Total Bilirubin: 0.8 mg/dL (ref 0.3–1.2)
Total Protein: 6.5 g/dL (ref 6.5–8.1)

## 2021-01-03 LAB — MAGNESIUM: Magnesium: 1.8 mg/dL (ref 1.7–2.4)

## 2021-01-03 MED ORDER — SODIUM CHLORIDE 0.9 % IV SOLN
Freq: Once | INTRAVENOUS | Status: AC
  Filled 2021-01-03: qty 250

## 2021-01-03 MED ORDER — HEPARIN SOD (PORK) LOCK FLUSH 100 UNIT/ML IV SOLN
500.0000 [IU] | Freq: Once | INTRAVENOUS | Status: AC | PRN
  Administered 2021-01-03: 500 [IU]
  Filled 2021-01-03: qty 5

## 2021-01-03 MED ORDER — SODIUM CHLORIDE 0.9% FLUSH
10.0000 mL | Freq: Once | INTRAVENOUS | Status: AC | PRN
  Administered 2021-01-03: 10 mL
  Filled 2021-01-03: qty 10

## 2021-01-03 NOTE — Telephone Encounter (Signed)
spoke with patient wife let her know that they need to call and get a referral for upcoming appt with lauren.

## 2021-01-03 NOTE — Telephone Encounter (Signed)
Thank you :)

## 2021-01-03 NOTE — Telephone Encounter (Signed)
va form that dr. Shea Evans filled out was faxed to [phone] and to  [phone]  Form was also faxed to beth in [employer]

## 2021-01-03 NOTE — Progress Notes (Signed)
No chemotherapy today per Dr. Mike Gip. I liter of IV fluids over 1.5 hours. Tolerated well. Patient stable at discharge.

## 2021-01-04 ENCOUNTER — Ambulatory Visit
Admission: RE | Admit: 2021-01-04 | Discharge: 2021-01-04 | Disposition: A | Discharge status: Home / Self Care | Payer: No Typology Code available for payment source | Source: Ambulatory Visit | Attending: Radiation Oncology | Admitting: Radiation Oncology

## 2021-01-04 ENCOUNTER — Inpatient Hospital Stay: Payer: No Typology Code available for payment source

## 2021-01-04 VITALS — BP 105/69 | HR 78 | Resp 18

## 2021-01-04 DIAGNOSIS — Z510 Encounter for antineoplastic radiation therapy: Secondary | ICD-10-CM | POA: Diagnosis not present

## 2021-01-04 DIAGNOSIS — K1233 Oral mucositis (ulcerative) due to radiation: Secondary | ICD-10-CM

## 2021-01-04 DIAGNOSIS — Z5111 Encounter for antineoplastic chemotherapy: Secondary | ICD-10-CM | POA: Diagnosis not present

## 2021-01-04 DIAGNOSIS — E860 Dehydration: Secondary | ICD-10-CM

## 2021-01-04 MED ORDER — SODIUM CHLORIDE 0.9 % IV SOLN
Freq: Once | INTRAVENOUS | Status: AC
  Filled 2021-01-04: qty 250

## 2021-01-04 MED ORDER — SODIUM CHLORIDE 0.9% FLUSH
10.0000 mL | INTRAVENOUS | Status: DC | PRN
  Administered 2021-01-04: 10 mL via INTRAVENOUS
  Filled 2021-01-04: qty 10

## 2021-01-04 MED ORDER — HEPARIN SOD (PORK) LOCK FLUSH 100 UNIT/ML IV SOLN
500.0000 [IU] | Freq: Once | INTRAVENOUS | Status: AC
  Administered 2021-01-04: 500 [IU] via INTRAVENOUS
  Filled 2021-01-04: qty 5

## 2021-01-04 NOTE — Progress Notes (Signed)
Patient arrived at the [facility] around 11:30 today  was found on his knees outside the front door. Patient stated that he felt fine this morning, but that when he arrived here, that he felt very weak upon getting out of the car. Patient was put into a wheelchair and brought back to the infusion room where he was found to have orthostatic hypotension. Patient received 1.5 liters of NS over 2 hours in clinic. Patient was instructed to increase fluids as tolerated to prevent dehydration. Patient was instructed to check his BP at home at least twice a day and to keep a written record. Dr. Mike Gip came back to talk to patient prior to discharge and instructed not to take his Losartan right now due to low BP and dehydration. Patient's wife was also present for instructions. Patient and his wife verbalized understanding. Patient to return to [facility] tomorrow for additional fluids. Patient stable at discharge.

## 2021-01-04 NOTE — Progress Notes (Signed)
[facility]  [address] [place], [place] [address] Phone: [phone]  Fax: [phone]   Clinic Day:  01/08/2021  Referring physician: Derinda Late, MD  Chief Complaint: Sean Garner. is a 75 y.o. 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  HPI:  The patient was last seen in the medical oncology clinic on 01/03/2021. At that time, radiation was on hold secondary to severe esophagitis.  He was able to eat and drink some soft foods.  Weight was down 5 pounds.  Hematocrit was 32.1, hemoglobin 11.3, MCV 86.1, platelets 101,000, WBC 6,600. Sodium was 132. He received IV fluids.  The patient is currently receiving IMRT treatment to the tongue. He has received55fractions thus far. The plan is for 70 Gy in 35 fractions. He took a break from radiation from 12/29/2020 - 01/03/2021.  He received radiation on 01/04/2021 - 01/05/2021. His last day of radiation is on 01/11/2021.  During the interim, he has been much better. His sore throat and trouble swallowing have improved. He has been using Magic Mouthwash with lidocaine. He drank two Glucernas this morning. Dr. Baruch Gouty put him on steroids last week and he is still taking them.  He gets short of breath when he walks up and down stairs. He was able to walk into clinic today with no trouble. He denies urinary symptoms, nausea, vomiting, dizziness, and lightheadedness.  He is not taking his blood pressure and blood sugar medications. He has been checking his blood pressure at home and it has been in the 140-150s/70s. His blood sugar this morning was 222.  He denies any new medications or herbal products.   Past Medical History:  Diagnosis Date  . Anxiety   . Cancer [facility])    Head/throat Cancer at the base of the tongue  . Cataract    lt eye repair;  present in rt eye but not ripe yet.  . Claustrophobia   . Diabetes mellitus without complication ([place])   . GERD (gastroesophageal reflux disease)   . Glaucoma    using gtts for this.  . Hypercholesterolemia   . Hypertension     Past Surgical History:  Procedure Laterality Date  . CATARACT EXTRACTION W/PHACO Left 12/03/2017   Procedure: CATARACT EXTRACTION PHACO AND INTRAOCULAR LENS PLACEMENT ([place]) COMPLICATED DIABETIC LEFT;  Surgeon: Leandrew Koyanagi, MD;  Location: [place];  Service: Ophthalmology;  Laterality: Left;  Diabetic - oral meds  . CHOLECYSTECTOMY    . COLONOSCOPY  2016  . PORTA CATH INSERTION N/A 11/13/2020   Procedure: PORTA CATH INSERTION;  Surgeon: Algernon Huxley, MD;  Location: [facility];  Service: Cardiovascular;  Laterality: N/A;    Family History  Problem Relation Age of Onset  . Cancer Mother   . Colon cancer Neg Hx   . Colon polyps Neg Hx   . Esophageal cancer Neg Hx   . Rectal cancer Neg Hx   . Stomach cancer Neg Hx     Social History:  reports that he quit smoking about 36 years ago. He has never used smokeless tobacco. He reports previous alcohol use. He reports that he does not use drugs. He quit smoking cold [place] in 1986. He smoked 2 packs per day for 20 years. He drinks a glass of wine or a Margarita occasionally. 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 He was exposed to [employer]. He worked with a Geologist, engineering for 34 years. The patient is accompanied by his wife today.  Allergies: No Known Allergies  Current Medications: Current Outpatient Medications  Medication Sig Dispense Refill  . ALPRAZolam (XANAX) 0.25 MG tablet Take 1 tablet (0.25 mg total) by mouth at bedtime as needed for anxiety. 60 tablet 0  . brimonidine-timolol (COMBIGAN) 0.2-0.5 % ophthalmic solution Place 1 drop into the left eye daily.    . calcium carbonate (OSCAL) 1500 (600 Ca) MG TABS tablet Take by mouth 2 (two) times daily with a  meal.    . citalopram (CELEXA) 20 MG tablet Take 1.5 tablets (30 mg total) by mouth daily with breakfast. 45 tablet 0  . dexamethasone (DECADRON) 4 MG tablet Take 1 tablet (4 mg total) by mouth 2 (two) times daily with a meal. 60 tablet 0  . esomeprazole (NEXIUM) 20 MG capsule Take 40 mg by mouth daily at 12 noon.    . fluticasone (FLONASE) 50 MCG/ACT nasal spray Place into both nostrils.    Marland Kitchen glucose blood test strip CHECK FASTING BLOOD SUGAR EVERY MORNING. (NEED SIGNATURE)    . lidocaine-prilocaine (EMLA) cream Apply to affected area once 30 g 3  . magic mouthwash w/lidocaine SOLN Take 5 mLs by mouth.    . simvastatin (ZOCOR) 40 MG tablet Take 40 mg by mouth daily at 6 PM.    . sucralfate (CARAFATE) 1 g tablet Take 0.5 tablets (0.5 g total) by mouth 3 (three) times daily. Dissolve in warm water, swish and swallow 60 tablet 1  . traZODone (DESYREL) 50 MG tablet Take 0.5-1 tablets (25-50 mg total) by mouth at bedtime as needed for sleep. 30 tablet 2  . VITAMIN D PO Take 800 Units by mouth daily.    Marland Kitchen dexamethasone (DECADRON) 4 MG tablet 2 TABS BY MOUTH DAILY. TAKE DAILY X 3 DAYS STARTING THE DAY AFTER CHEMOTHERAPY. TAKE WITH FOOD. (Patient not taking: Reported on 01/08/2021) 30 tablet 1  . glipiZIDE (GLUCOTROL XL) 5 MG 24 hr tablet Take 5 mg by mouth 2 (two) times daily. (Patient not taking: Reported on 01/08/2021)  1  . losartan (COZAAR) 100 MG tablet Take 100 mg by mouth daily.    . metFORMIN (GLUCOPHAGE-XR) 500 MG 24 hr tablet Take 500 mg by mouth 2 (two) times daily. Takes 2 tablets and 1 tablet in the pm (Patient not taking: Reported on 01/08/2021)    . ondansetron (ZOFRAN) 8 MG tablet Take 1 tablet (8 mg total) by mouth 2 (two) times daily as needed. Start on the third day after cisplatin chemotherapy. (Patient not taking: No sig reported) 30 tablet 1  . prochlorperazine (COMPAZINE) 10 MG tablet Take 1 tablet (10 mg total) by mouth every 6 (six) hours as needed (Nausea or vomiting). (Patient not  taking: No sig reported) 30 tablet 1   Current Facility-Administered Medications  Medication Dose Route Frequency Provider Last Rate Last Admin  . 0.9 %  sodium chloride infusion  500 mL Intravenous Once Pyrtle, Lajuan Lines, MD       Facility-Administered Medications Ordered in Other Visits  Medication Dose Route Frequency Provider Last Rate Last Admin  . 0.9 % NaCl with KCl 20 mEq/ L  infusion   Intravenous Once Charrise Lardner C, MD      . CISplatin (PLATINOL) 87 mg in sodium chloride 0.9 % 250 mL chemo infusion  40 mg/m2 (Order-Specific) Intravenous Once Lequita Asal, MD      .  dexamethasone (DECADRON) 10 mg in sodium chloride 0.9 % 50 mL IVPB  10 mg Intravenous Once Gerrie Castiglia C, MD      . fosaprepitant (EMEND) 150 mg in sodium chloride 0.9 % 145 mL IVPB  150 mg Intravenous Once Tanush Drees C, MD      . magnesium sulfate IVPB 2 g 50 mL  2 g Intravenous Once Lequita Asal, MD        Review of Systems  Constitutional: Negative for chills, diaphoresis, fever, malaise/fatigue and weight loss (up 5 lbs).  HENT: Positive for hearing loss and sore throat (improved). Negative for congestion, ear discharge, ear pain, nosebleeds, sinus pain and tinnitus.        Swallowing has improved.  Eyes: Negative for blurred vision.  Respiratory: Positive for shortness of breath (with exertion). Negative for cough, hemoptysis and sputum production.   Cardiovascular: Negative for chest pain, palpitations and leg swelling.  Gastrointestinal: Negative for abdominal pain, blood in stool, constipation, diarrhea, heartburn, melena, nausea and vomiting.       Oral intake has improved.  Genitourinary: Negative for dysuria, frequency, hematuria and urgency.  Musculoskeletal: Negative for back pain, joint pain, myalgias and neck pain.  Skin: Negative for itching and rash.  Neurological: Negative for dizziness, tingling, sensory change, weakness and headaches.  Endo/Heme/Allergies: Does not  bruise/bleed easily.  Psychiatric/Behavioral: Negative for depression and memory loss. The patient is not nervous/anxious (on citalopram and Xanax) and does not have insomnia.   All other systems reviewed and are negative.  Performance status (ECOG): 1  Vitals Blood pressure (!) 157/74, pulse (!) 54, temperature (!) 96.9 F (36.1 C), temperature source Tympanic, resp. rate 18, height 5' 10.5" (1.791 m), weight 196 lb 3.4 oz (89 kg), SpO2 96 %.   Physical Exam Vitals and nursing note reviewed.  Constitutional:      General: He is not in acute distress.    Appearance: He is not diaphoretic.  HENT:     Head: Normocephalic and atraumatic.     Comments: Gray hair.    Mouth/Throat:     Mouth: Mucous membranes are moist.     Pharynx: Oropharynx is clear.  Eyes:     General: No scleral icterus.    Extraocular Movements: Extraocular movements intact.     Conjunctiva/sclera: Conjunctivae normal.     Pupils: Pupils are equal, round, and reactive to light.     Comments: Blue eyes.  Neck:     Comments: 1.3 x 1.3 cm right neck mass (previously 1.5 x 1.2 cm). Cardiovascular:     Rate and Rhythm: Normal rate and regular rhythm.     Heart sounds: Normal heart sounds. No murmur heard.   Pulmonary:     Effort: Pulmonary effort is normal. No respiratory distress.     Breath sounds: Normal breath sounds. No wheezing or rales.  Chest:     Chest wall: No tenderness.  Breasts:     Right: No axillary adenopathy or supraclavicular adenopathy.     Left: No axillary adenopathy or supraclavicular adenopathy.    Abdominal:     General: Bowel sounds are normal. There is no distension.     Palpations: Abdomen is soft. There is no mass.     Tenderness: There is no abdominal tenderness. There is no guarding or rebound.  Musculoskeletal:        General: No swelling or tenderness. Normal range of motion.     Cervical back: Normal range of motion and neck supple.  Lymphadenopathy:     Head:     Right  side of head: No preauricular, posterior auricular or occipital adenopathy.     Left side of head: No preauricular, posterior auricular or occipital adenopathy.     Upper Body:     Right upper body: No supraclavicular or axillary adenopathy.     Left upper body: No supraclavicular or axillary adenopathy.     Lower Body: No right inguinal adenopathy. No left inguinal adenopathy.  Skin:    General: Skin is warm and dry.  Neurological:     Mental Status: He is alert and oriented to person, place, and time.  Psychiatric:        Behavior: Behavior normal.        Thought Content: Thought content normal.        Judgment: Judgment normal.    Infusion on 01/08/2021  Component Date Value Ref Range Status  . WBC 01/08/2021 3.8* 4.0 - 10.5 K/uL Final  . RBC 01/08/2021 3.24* 4.22 - 5.81 MIL/uL Final  . Hemoglobin 01/08/2021 10.1* 13.0 - 17.0 g/dL Final  . HCT 01/08/2021 28.5* 39.0 - 52.0 % Final  . MCV 01/08/2021 88.0  80.0 - 100.0 fL Final  . MCH 01/08/2021 31.2  26.0 - 34.0 pg Final  . MCHC 01/08/2021 35.4  30.0 - 36.0 g/dL Final  . RDW 01/08/2021 15.4  11.5 - 15.5 % Final  . Platelets 01/08/2021 80* 150 - 400 K/uL Final   Comment: Immature Platelet Fraction may be clinically indicated, consider ordering this additional test [id number]   . nRBC 01/08/2021 0.0  0.0 - 0.2 % Final  . Neutrophils Relative % 01/08/2021 91  % Final  . Neutro Abs 01/08/2021 3.4  1.7 - 7.7 K/uL Final  . Lymphocytes Relative 01/08/2021 3  % Final  . Lymphs Abs 01/08/2021 0.1* 0.7 - 4.0 K/uL Final  . Monocytes Relative 01/08/2021 5  % Final  . Monocytes Absolute 01/08/2021 0.2  0.1 - 1.0 K/uL Final  . Eosinophils Relative 01/08/2021 0  % Final  . Eosinophils Absolute 01/08/2021 0.0  0.0 - 0.5 K/uL Final  . Basophils Relative 01/08/2021 0  % Final  . Basophils Absolute 01/08/2021 0.0  0.0 - 0.1 K/uL Final  . Immature Granulocytes 01/08/2021 1  % Final  . Abs Immature Granulocytes 01/08/2021 0.03  0.00 - 0.07 K/uL  Final   Performed at [facility], [address], [place], [place] [address]    Assessment:  Sean Soules. is a 75 y.o. 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  He presented with right neck adenopathy.    Neck soft tissue CT on 09/28/2020 revealed a 3.5 x 3.0 x 4.8 cm right neck mass most c/w enlarged lymph node. There was a 14 mm enhancing mass in the right base of tongue.   PET scan on 10/25/2020 revealed right tongue base primary with ipsilateral level 2 nodal metastasis. There were no findings of extracervical metastatic disease. There was focal hypermetabolism in the distal esophagus, with possible concurrent soft tissue density lesion. Recommended correlation with endoscopy. There was vague right lower lobe low-level hypermetabolism and ground-glass nodularity, favored minimal infection or aspiration. Incidental findings included aortic atherosclerosis, coronary artery atherosclerosis, emphysema, a tiny hiatal hernia, and prostatomegaly.  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  Speech discrimination scores were 100% in each ear.  He is s/p week #6 cisplatin and concurrent radiation (11/20/2020 - 12/27/2020).  He is tolerating treatment well.  He has radiation induced oral changes affecting taste and swallowing.   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  Upper endoscopy on 11/03/2020 revealed a malignant esophageal tumor at the gastroesophageal junction. There were esophageal mucosal changes c/w short-segment Barrett's esophagus. There was a 2 cm hiatal hernia. There was gastritis.  Pathology in the esophagus at 36 cm revealed intramucosal adenocarcinoma arising in a background of high grade dysplasia and  intestinal metaplasia; pathology at 38 cm revealed adenocarcinoma.  EUS on 11/15/2020 revealed early stage adenocarcinoma arising from Barrett's esophagus.  Lesion was T1bN0 and non-obstructing.  He has PTSD and clastrophobia.  Anxiety is relieved by alprazolam 0.25 mg prior to radiation.  He is on citalopram 20 mg a day and trazodone at night.  Symptomatically,  he feels much better. His sore throat and trouble swallowing have improved.  He denies any nausea or vomiting.  Exam reveals a 1.3 cm right neck mass.  Platelet count is 80,000.  Plan: 1.   Labs today: CBC with diff, CMP, Mg. 2. Clinical stage I right base of tongue carcinoma Clinical stage I. 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 PET scan on 10/25/2020 revealed a right base of tongue primary with ipsilateral level 2 nodal metastasis. No evidence of distant metastasis. Plan: cisplatin 40 mg/m2 weekly x 7 with radiation.             He began concurrent radiation and cisplatin on 11/20/2020.  Clinically, he is doing well..   Neck adenopathy decreases weekly..  Labs reviewed.  Postpone week #7 cisplatin today.   Platelet count 80,000.  Discuss symptom management.  He has antiemetics at home to use on a prn bases.  Interventions are adequate.   . 3. 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 EUS on 11/15/2020 revealed a T1bN0 lesion.  He may be a candidate for endoscopic resection followed by ablation or esophagectomy.  He has an appointment with Dr. Keturah BarreAbigail Butts at [facility]. 4.   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  Oral exam is fairly unremarkable.  He utilizes Oceanographer mouthwash.  Continue  soft foods. 5. Anxiety  Anxiety is well controlled on his current regimen. 6.   Hyponatremia             Sodium 131.             Continue fluids with electrolytes.  Patient requests additional fluids today and tomorrow. 7.   Hypomagnesemia, improved  Magnesium 1.8 today. 8.   Thrombocytopenia  Etiology likely secondary to myelosuppression from chemotherapy.  Patient denies any new medications or herbal products.  Additional work-up: platelet count in a blue top tube, B12, folate and TSH. 9.   IVF (500 cc) today and tomorrow. 10.   No chemotherapy today secondary to thrombocytopenia. 11.   RTC on 02/23 for MD assess, labs (CBC with diff, CMP), and cisplatin.  I discussed the assessment and treatment plan with the patient.  The patient was provided an opportunity to ask questions and all were answered.  The patient agreed with the plan and demonstrated an understanding of the instructions.  The  patient was advised to call back if the symptoms worsen or if the condition fails to improve as anticipated.  I provided 21 minutes of face-to-face time during this this encounter and > 50% was spent counseling as documented under my assessment and plan.  An additional 5 minutes were spent reviewing his chart (Epic and Care Everywhere) including notes, labs, and imaging studies.    Other Atienza C. Mike Gip, MD, PhD    01/08/2021, 9:57 AM   I, Mirian Mo Tufford, am acting as Education administrator for [employer]. Mike Gip, MD, PhD.  I, Mettie Roylance C. Mike Gip, MD, have reviewed the above documentation for accuracy and completeness, and I agree with the above.

## 2021-01-05 ENCOUNTER — Inpatient Hospital Stay: Payer: No Typology Code available for payment source

## 2021-01-05 ENCOUNTER — Ambulatory Visit: Payer: No Typology Code available for payment source

## 2021-01-05 ENCOUNTER — Other Ambulatory Visit: Payer: Self-pay

## 2021-01-05 ENCOUNTER — Ambulatory Visit
Admission: RE | Admit: 2021-01-05 | Discharge: 2021-01-05 | Disposition: A | Discharge status: Home / Self Care | Payer: No Typology Code available for payment source | Source: Ambulatory Visit | Attending: Radiation Oncology | Admitting: Radiation Oncology

## 2021-01-05 VITALS — BP 120/61 | HR 66 | Temp 97.8°F | Resp 18

## 2021-01-05 DIAGNOSIS — C770 Secondary and unspecified malignant neoplasm of lymph nodes of head, face and neck: Secondary | ICD-10-CM | POA: Diagnosis not present

## 2021-01-05 DIAGNOSIS — Z87891 Personal history of nicotine dependence: Secondary | ICD-10-CM | POA: Diagnosis not present

## 2021-01-05 DIAGNOSIS — C160 Malignant neoplasm of cardia: Secondary | ICD-10-CM | POA: Diagnosis not present

## 2021-01-05 DIAGNOSIS — Z510 Encounter for antineoplastic radiation therapy: Secondary | ICD-10-CM | POA: Diagnosis not present

## 2021-01-05 DIAGNOSIS — E860 Dehydration: Secondary | ICD-10-CM

## 2021-01-05 DIAGNOSIS — C01 Malignant neoplasm of base of tongue: Secondary | ICD-10-CM | POA: Diagnosis present

## 2021-01-05 DIAGNOSIS — Z515 Encounter for palliative care: Secondary | ICD-10-CM | POA: Diagnosis not present

## 2021-01-05 MED ORDER — SODIUM CHLORIDE 0.9% FLUSH
10.0000 mL | INTRAVENOUS | Status: DC | PRN
  Administered 2021-01-05: 10 mL via INTRAVENOUS
  Filled 2021-01-05: qty 10

## 2021-01-05 MED ORDER — HEPARIN SOD (PORK) LOCK FLUSH 100 UNIT/ML IV SOLN
500.0000 [IU] | Freq: Once | INTRAVENOUS | Status: AC
  Administered 2021-01-05: 500 [IU] via INTRAVENOUS
  Filled 2021-01-05: qty 5

## 2021-01-05 MED ORDER — SODIUM CHLORIDE 0.9 % IV SOLN
Freq: Once | INTRAVENOUS | Status: AC
  Filled 2021-01-05: qty 250

## 2021-01-05 NOTE — Progress Notes (Signed)
Pt reports feeling so much better today, he stopped taking BP medicine. BP improved. States he is trying his hardest to drink more liquids. Pt states he feels even better after fluids today.

## 2021-01-08 ENCOUNTER — Inpatient Hospital Stay: Payer: No Typology Code available for payment source

## 2021-01-08 ENCOUNTER — Ambulatory Visit
Admission: RE | Admit: 2021-01-08 | Discharge: 2021-01-08 | Disposition: A | Discharge status: Home / Self Care | Payer: No Typology Code available for payment source | Source: Ambulatory Visit | Attending: Radiation Oncology | Admitting: Radiation Oncology

## 2021-01-08 ENCOUNTER — Other Ambulatory Visit: Payer: Self-pay

## 2021-01-08 ENCOUNTER — Encounter: Payer: Self-pay | Admitting: Hematology and Oncology

## 2021-01-08 ENCOUNTER — Inpatient Hospital Stay (HOSPITAL_BASED_OUTPATIENT_CLINIC_OR_DEPARTMENT_OTHER): Payer: No Typology Code available for payment source | Admitting: Hematology and Oncology

## 2021-01-08 ENCOUNTER — Ambulatory Visit: Payer: No Typology Code available for payment source

## 2021-01-08 VITALS — BP 157/74 | HR 54 | Temp 96.9°F | Resp 18 | Ht 70.5 in | Wt 196.2 lb

## 2021-01-08 DIAGNOSIS — E860 Dehydration: Secondary | ICD-10-CM | POA: Insufficient documentation

## 2021-01-08 DIAGNOSIS — Z510 Encounter for antineoplastic radiation therapy: Secondary | ICD-10-CM | POA: Diagnosis not present

## 2021-01-08 DIAGNOSIS — Z7189 Other specified counseling: Secondary | ICD-10-CM

## 2021-01-08 DIAGNOSIS — C01 Malignant neoplasm of base of tongue: Secondary | ICD-10-CM

## 2021-01-08 DIAGNOSIS — E871 Hypo-osmolality and hyponatremia: Secondary | ICD-10-CM

## 2021-01-08 DIAGNOSIS — D649 Anemia, unspecified: Secondary | ICD-10-CM

## 2021-01-08 DIAGNOSIS — C159 Malignant neoplasm of esophagus, unspecified: Secondary | ICD-10-CM

## 2021-01-08 DIAGNOSIS — D696 Thrombocytopenia, unspecified: Secondary | ICD-10-CM | POA: Diagnosis not present

## 2021-01-08 DIAGNOSIS — Y842 Radiological procedure and radiotherapy as the cause of abnormal reaction of the patient, or of later complication, without mention of misadventure at the time of the procedure: Secondary | ICD-10-CM

## 2021-01-08 DIAGNOSIS — Z5111 Encounter for antineoplastic chemotherapy: Secondary | ICD-10-CM

## 2021-01-08 DIAGNOSIS — K1233 Oral mucositis (ulcerative) due to radiation: Secondary | ICD-10-CM

## 2021-01-08 LAB — CBC WITH DIFFERENTIAL/PLATELET
Abs Immature Granulocytes: 0.03 10*3/uL (ref 0.00–0.07)
Basophils Absolute: 0 10*3/uL (ref 0.0–0.1)
Basophils Relative: 0 %
Eosinophils Absolute: 0 10*3/uL (ref 0.0–0.5)
Eosinophils Relative: 0 %
HCT: 28.5 % — ABNORMAL LOW (ref 39.0–52.0)
Hemoglobin: 10.1 g/dL — ABNORMAL LOW (ref 13.0–17.0)
Immature Granulocytes: 1 %
Lymphocytes Relative: 3 %
Lymphs Abs: 0.1 10*3/uL — ABNORMAL LOW (ref 0.7–4.0)
MCH: 31.2 pg (ref 26.0–34.0)
MCHC: 35.4 g/dL (ref 30.0–36.0)
MCV: 88 fL (ref 80.0–100.0)
Monocytes Absolute: 0.2 10*3/uL (ref 0.1–1.0)
Monocytes Relative: 5 %
Neutro Abs: 3.4 10*3/uL (ref 1.7–7.7)
Neutrophils Relative %: 91 %
Platelets: 80 10*3/uL — ABNORMAL LOW (ref 150–400)
RBC: 3.24 MIL/uL — ABNORMAL LOW (ref 4.22–5.81)
RDW: 15.4 % (ref 11.5–15.5)
WBC: 3.8 10*3/uL — ABNORMAL LOW (ref 4.0–10.5)
nRBC: 0 % (ref 0.0–0.2)

## 2021-01-08 LAB — IRON AND TIBC
Iron: 135 ug/dL (ref 45–182)
Saturation Ratios: 43 % — ABNORMAL HIGH (ref 17.9–39.5)
TIBC: 318 ug/dL (ref 250–450)
UIBC: 183 ug/dL

## 2021-01-08 LAB — COMPREHENSIVE METABOLIC PANEL
ALT: 22 U/L (ref 0–44)
AST: 17 U/L (ref 15–41)
Albumin: 3.6 g/dL (ref 3.5–5.0)
Alkaline Phosphatase: 42 U/L (ref 38–126)
Anion gap: 11 (ref 5–15)
BUN: 27 mg/dL — ABNORMAL HIGH (ref 8–23)
CO2: 25 mmol/L (ref 22–32)
Calcium: 9.1 mg/dL (ref 8.9–10.3)
Chloride: 95 mmol/L — ABNORMAL LOW (ref 98–111)
Creatinine, Ser: 0.98 mg/dL (ref 0.61–1.24)
GFR, Estimated: >60 mL/min (ref >60)
Glucose, Bld: 267 mg/dL — ABNORMAL HIGH (ref 70–99)
Potassium: 4.2 mmol/L (ref 3.5–5.1)
Sodium: 131 mmol/L — ABNORMAL LOW (ref 135–145)
Total Bilirubin: 0.7 mg/dL (ref 0.3–1.2)
Total Protein: 6.1 g/dL — ABNORMAL LOW (ref 6.5–8.1)

## 2021-01-08 LAB — MAGNESIUM: Magnesium: 1.8 mg/dL (ref 1.7–2.4)

## 2021-01-08 LAB — TSH: TSH: 0.577 u[IU]/mL (ref 0.350–4.500)

## 2021-01-08 LAB — FOLATE: Folate: 10.3 ng/mL (ref >5.9)

## 2021-01-08 LAB — IMMATURE PLATELET FRACTION: Immature Platelet Fraction: 3.7 % (ref 1.2–8.6)

## 2021-01-08 LAB — FERRITIN: Ferritin: 424 ng/mL — ABNORMAL HIGH (ref 24–336)

## 2021-01-08 LAB — PLATELET BY CITRATE: Platelet CT in Citrate: 56

## 2021-01-08 LAB — VITAMIN B12: Vitamin B-12: 180 pg/mL (ref 180–914)

## 2021-01-08 MED ORDER — SODIUM CHLORIDE 0.9 % IV SOLN
Freq: Once | INTRAVENOUS | Status: AC
  Filled 2021-01-08: qty 250

## 2021-01-08 MED ORDER — HEPARIN SOD (PORK) LOCK FLUSH 100 UNIT/ML IV SOLN
INTRAVENOUS | Status: AC
  Filled 2021-01-08: qty 5

## 2021-01-08 MED ORDER — HEPARIN SOD (PORK) LOCK FLUSH 100 UNIT/ML IV SOLN
500.0000 [IU] | Freq: Once | INTRAVENOUS | Status: AC
  Administered 2021-01-08: 500 [IU] via INTRAVENOUS
  Filled 2021-01-08: qty 5

## 2021-01-08 NOTE — Progress Notes (Signed)
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    Spoke with patient and wife via phone.  Patient reports that his appetite is better and that he is wanting something to eat.  Ate biscuit with sausage gravy this am for breakfast.  Had 1/3rd hamburger steak with gravy, macaroni and cheese, cooked apples for lunch today.  Has eaten fried fish with tarter sauce, fruit cocktail. Drinking glucerna and ensure complete.  Ensure complete is thick per patient. Has been using mouth wash before he eats to numb mouth.  Planning on eating banana pudding and baked beans in a little bit.   Patient did not get chemotherapy today due to low platelets.  Radiation due to end on 2/24    Medications: reviewed  Labs: reviewed  Anthropometrics:   Weight 196 lb 2/21 increased from 191 lb on 2/16   NUTRITION DIAGNOSIS: Inadequate oral intake improving   INTERVENTION:  Discussed ways to thin higher calorie shakes Encouraged patient to continue eating high calorie, high protein foods (soft and moist foods) Patient has contact information    MONITORING, EVALUATION, GOAL: weight trends, intake   NEXT VISIT: phone call 2/28  Tyniah Kastens B. Zenia Resides, [place], [place] Registered Dietitian [phone] (mobile)

## 2021-01-09 ENCOUNTER — Inpatient Hospital Stay: Payer: No Typology Code available for payment source

## 2021-01-09 ENCOUNTER — Telehealth: Payer: Self-pay

## 2021-01-09 ENCOUNTER — Encounter (HOSPITAL_COMMUNITY): Payer: Self-pay | Admitting: Licensed Clinical Social Worker

## 2021-01-09 ENCOUNTER — Ambulatory Visit (INDEPENDENT_AMBULATORY_CARE_PROVIDER_SITE_OTHER): Payer: No Typology Code available for payment source | Admitting: Licensed Clinical Social Worker

## 2021-01-09 ENCOUNTER — Ambulatory Visit
Admission: RE | Admit: 2021-01-09 | Discharge: 2021-01-09 | Disposition: A | Discharge status: Home / Self Care | Payer: No Typology Code available for payment source | Source: Ambulatory Visit | Attending: Radiation Oncology | Admitting: Radiation Oncology

## 2021-01-09 VITALS — BP 131/65 | HR 45 | Temp 97.7°F | Resp 20

## 2021-01-09 DIAGNOSIS — F411 Generalized anxiety disorder: Secondary | ICD-10-CM | POA: Diagnosis not present

## 2021-01-09 DIAGNOSIS — F4312 Post-traumatic stress disorder, chronic: Secondary | ICD-10-CM | POA: Diagnosis not present

## 2021-01-09 DIAGNOSIS — Z87891 Personal history of nicotine dependence: Secondary | ICD-10-CM | POA: Diagnosis not present

## 2021-01-09 DIAGNOSIS — Z515 Encounter for palliative care: Secondary | ICD-10-CM | POA: Diagnosis not present

## 2021-01-09 DIAGNOSIS — Z510 Encounter for antineoplastic radiation therapy: Secondary | ICD-10-CM | POA: Diagnosis not present

## 2021-01-09 DIAGNOSIS — C770 Secondary and unspecified malignant neoplasm of lymph nodes of head, face and neck: Secondary | ICD-10-CM | POA: Diagnosis not present

## 2021-01-09 DIAGNOSIS — E860 Dehydration: Secondary | ICD-10-CM

## 2021-01-09 DIAGNOSIS — C01 Malignant neoplasm of base of tongue: Secondary | ICD-10-CM | POA: Diagnosis not present

## 2021-01-09 DIAGNOSIS — C160 Malignant neoplasm of cardia: Secondary | ICD-10-CM | POA: Diagnosis not present

## 2021-01-09 MED ORDER — SODIUM CHLORIDE 0.9% FLUSH
10.0000 mL | Freq: Once | INTRAVENOUS | Status: AC
  Administered 2021-01-09: 10 mL via INTRAVENOUS
  Filled 2021-01-09: qty 10

## 2021-01-09 MED ORDER — HEPARIN SOD (PORK) LOCK FLUSH 100 UNIT/ML IV SOLN
500.0000 [IU] | Freq: Once | INTRAVENOUS | Status: AC
  Administered 2021-01-09: 500 [IU] via INTRAVENOUS
  Filled 2021-01-09: qty 5

## 2021-01-09 MED ORDER — SODIUM CHLORIDE 0.9 % IV SOLN
Freq: Once | INTRAVENOUS | Status: AC
  Filled 2021-01-09: qty 250

## 2021-01-09 NOTE — Progress Notes (Signed)
Comprehensive Clinical Assessment (CCA) Virtual Video Note  01/09/2021 Sean Garner [id number]  Chief Complaint:  Chief Complaint  Patient presents with  . Anxiety  . Trauma   Visit Diagnosis: Anxiety, PTSD  LOCATION:  Patient: Home Provider: Home office   CCA Screening, Triage and Referral (STR)  Patient Reported Information How did you hear about [place]? No data recorded Referral name: No data recorded Referral phone number: No data recorded  Whom do you see for routine medical problems? No data recorded Practice/Facility Name: No data recorded Practice/Facility Phone Number: No data recorded Name of Contact: No data recorded Contact Number: No data recorded Contact Fax Number: No data recorded Prescriber Name: No data recorded Prescriber Address (if known): No data recorded  What Is the Reason for Your Visit/Call Today? No data recorded How Long Has This Been Causing You Problems? No data recorded What Do You Feel Would Help You the Most Today? No data recorded  Have You Recently Been in Any Inpatient Treatment (Hospital/Detox/Crisis Center/28-Day Program)? No data recorded Name/Location of Program/Hospital:No data recorded How Long Were You There? No data recorded When Were You Discharged? No data recorded  Have You Ever Received Services From [facility] Before? No data recorded Who Do You See at [facility]? No data recorded  Have You Recently Had Any Thoughts About Hurting Yourself? No data recorded Are You Planning to Commit Suicide/Harm Yourself At This time? No data recorded  Have you Recently Had Thoughts About [place]? No data recorded Explanation: No data recorded  Have You Used Any Alcohol or Drugs in the Past 24 Hours? No data recorded How Long Ago Did You Use Drugs or Alcohol? No data recorded What Did You Use and How Much? No data recorded  Do You Currently Have a Therapist/Psychiatrist? No data recorded Name of  Therapist/Psychiatrist: No data recorded  Have You Been Recently Discharged From Any Office Practice or Programs? No data recorded Explanation of Discharge From Practice/Program: No data recorded    CCA Screening Triage Referral Assessment Type of Contact: No data recorded Is this Initial or Reassessment? No data recorded Date Telepsych consult ordered in [facility]:  No data recorded Time Telepsych consult ordered in [facility]:  No data recorded  Patient Reported Information Reviewed? No data recorded Patient Left Without Being Seen? No data recorded Reason for Not Completing Assessment: No data recorded  Collateral Involvement: No data recorded  Does Patient Have a [place]? No data recorded Name and Contact of Legal Guardian: No data recorded If Minor and Not Living with Parent(s), Who has Custody? No data recorded Is CPS involved or ever been involved? No data recorded Is APS involved or ever been involved? No data recorded  Patient Determined To Be At Risk for Harm To Self or Others Based on Review of Patient Reported Information or Presenting Complaint? No data recorded Method: No data recorded Availability of Means: No data recorded Intent: No data recorded Notification Required: No data recorded Additional Information for Danger to Others Potential: No data recorded Additional Comments for Danger to Others Potential: No data recorded Are There Guns or Other Weapons in Your Home? No data recorded Types of Guns/Weapons: No data recorded Are These Weapons Safely Secured?                            No data recorded Who Could Verify You Are Able To Have These Secured: No data recorded  Do You Have any Outstanding Charges, Pending Court Dates, Parole/Probation? No data recorded Contacted To Inform of Risk of Harm To Self or Others: No data recorded  Location of Assessment: No data recorded  Does Patient Present under Involuntary Commitment? 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 I'm doing chemotherapy and radiation. Will be done with both by the end of the week. I asked for therapy, because of my anxiety. Pt sees Dr. Budd Palmer psychiatrist who monitors my medication. I saw a psychiatrist 10 years ago, but never had therapy. I've been on celexa and she increased it.. pt takes .25 xanax when he goes to radiation Pt was in [place] PTSD diagnosis. night sweats, nighmares, affected by agent orange (blood sugar issues)  Current Symptoms/Problems: anxiety, fear, ptsd symptoms (night sweats, nightmares, sleeplessness, flashbacks)   Patient Reported Schizophrenia/Schizoaffective Diagnosis in Past: No   Strengths: supportive wife, desire to feel better  Preferences: prefers outpatient services  Abilities: No data recorded  Type of Services Patient Feels are Needed: therapy and medication managment   Initial Clinical Notes/Concerns: No data recorded  Mental Health Symptoms Depression:  Change in energy/activity; Fatigue; Difficulty Concentrating; Hopelessness; Irritability; Worthlessness   Duration of Depressive symptoms: Greater than two weeks   Mania:  None   Anxiety:   Restlessness; Tension; Sleep; Worrying   Psychosis:  None   Duration of Psychotic symptoms: No data recorded  Trauma:  Avoids reminders of event; Hypervigilance; Irritability/anger; Difficulty staying/falling asleep; Re-experience of traumatic event; Guilt/shame   Obsessions:  Disrupts routine/functioning; Cause anxiety; Intrusive/time consuming   Compulsions:  "Driven" to perform behaviors/acts; Disrupts with routine/functioning   Inattention:   None   Hyperactivity/Impulsivity:  N/A   Oppositional/Defiant Behaviors:  None   Emotional Irregularity:  None   Other Mood/Personality Symptoms:  No data recorded   Mental Status Exam Appearance and self-care  Stature:  Average   Weight:  Average weight   Clothing:  Casual   Grooming:  Normal   Cosmetic use:  None   Posture/gait:  Normal   Motor activity:  Not Remarkable   Sensorium  Attention:  Normal   Concentration:  Anxiety interferes   Orientation:  X5   Recall/memory:  Defective in Short-term   Affect and Mood  Affect:  Anxious   Mood:  Anxious   Relating  Eye contact:  Normal   Facial expression:  Anxious   Attitude toward examiner:  Cooperative   Thought and Language  Speech flow: Clear and Coherent   Thought content:  Appropriate to Mood and Circumstances   Preoccupation:  Ruminations   Hallucinations:  No data recorded  Organization:  No data recorded  [employer] of Knowledge:  Average   Intelligence:  Average   Abstraction:  Normal   Judgement:  Fair   Reality Testing:  Adequate   Insight:  Fair   Decision Making:  Normal   Social Functioning  Social Maturity:  Responsible   Social Judgement:  Normal   Stress  Stressors:  Illness   Coping Ability:  Exhausted; Overwhelmed   Skill Deficits:  None   Supports:  Friends/Service system; Family     Religion: Religion/Spirituality Are You A Religious  Person?: Yes  Leisure/Recreation: Leisure / Recreation Do You Have Hobbies?: Yes Leisure and Hobbies: travel, watch tv, read news  Exercise/Diet: Exercise/Diet Do You Exercise?: No Have You Gained or Lost A Significant Amount of Weight in the Past Six Months?: No Do You Follow a Special Diet?: No Do You Have Any Trouble Sleeping?: Yes Explanation of Sleeping Difficulties: nightmares, restlessness, night sweats   CCA Employment/Education Employment/Work Situation: Employment / Work  Copywriter, advertising Employment situation: Retired Archivist job has been impacted by current illness: No What is the longest time patient has a held a job?: 30 years 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, Has patient ever been in the [employer]?: Yes (Describe in comment)  Education: Education Is Patient Currently Attending School?: No Last Grade Completed: 14 Did Teacher, adult education From [employer]?: Yes Did Physicist, medical?: Yes What Type of College Degree Do you Have?: 2 years business administration Did [place]?: No Did You Have An Individualized Education Program (IIEP): No Did You Have Any Difficulty At School?: No Patient's Education Has Been Impacted by Current Illness: No   CCA Family/Childhood History Family and Relationship History: Family history Marital status: Married Number of Years Married: 39 What types of issues is patient dealing with in the relationship?: none other than his current cancer diagnosis Does patient have children?: Yes How many children?: 1 How is patient's relationship with their children?: step daughter at age 65, 4 grandchildren who live in [place], good relationship with all  Childhood History:  Childhood History By whom was/is the patient raised?: Both parents Additional childhood history information: good childhood, no problems. I did what my daddy said. Both of my parents worked. My grandparents lived down the road from [place]. no siblings Description of patient's relationship with caregiver when they were a child: good Patient's description of current relationship with people who raised him/her: both parents are deceased How were you disciplined when you got in trouble as a child/adolescent?: spankings rarely Does patient have siblings?: No Did patient suffer any verbal/emotional/physical/sexual abuse as a child?: No Did patient suffer from severe childhood neglect?: No Has patient ever been sexually  abused/assaulted/raped as an adolescent or adult?: No Was the patient ever a victim of a crime or a disaster?: No Witnessed domestic violence?: No Has patient been affected by domestic violence as an adult?: No  Child/Adolescent Assessment:     CCA Substance Use Alcohol/Drug Use: Alcohol / Drug Use History of alcohol / drug use?: No history of alcohol / drug abuse                         ASAM's:  Six Dimensions of Multidimensional Assessment  Dimension 1:  Acute Intoxication and/or Withdrawal Potential:      Dimension 2:  Biomedical Conditions and Complications:      Dimension 3:  Emotional, Behavioral, or Cognitive Conditions and Complications:     Dimension 4:  Readiness to Change:     Dimension 5:  Relapse, Continued use, or Continued Problem Potential:     Dimension 6:  Recovery/Living Environment:     ASAM Severity Score:    ASAM Recommended Level of Treatment:     Substance use Disorder (SUD)    Recommendations for Services/Supports/Treatments: Recommendations for Services/Supports/Treatments Recommendations For Services/Supports/Treatments: Individual Therapy,Medication Management  DSM5 Diagnoses: Patient Active Problem List   Diagnosis Date Noted  . Dehydration 01/08/2021  . Glaucoma 01/01/2021  . Exposure to [employer]  01/01/2021  . Diabetes mellitus ([place]) 01/01/2021  . High risk medication use 01/01/2021  . GAD (generalized anxiety disorder) 01/01/2021  . 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 Hypocalcemia 12/15/2020  . Hypomagnesemia 12/13/2020  . Hyponatremia 11/28/2020  . Anxiety 11/27/2020  . Encounter for antineoplastic chemotherapy 11/19/2020  . 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 Chronic post-traumatic stress disorder (PTSD) 10/24/2020  . 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 Benign essential hypertension 08/08/2014  . GERD (gastroesophageal reflux disease) 08/08/2014  . Other and unspecified  hyperlipidemia 08/08/2014  . Type 2 diabetes mellitus without complications ([place]) 75/17/0017  . Knee pain 09/09/2013  . Osteoarthritis of right knee 09/09/2013    Patient Centered Plan: Patient is on the following Treatment Plan(s):  Anxiety, PTSD   Referrals to Alternative Service(s): Referred to Alternative Service(s):   Place:   Date:   Time:    Referred to Alternative Service(s):   Place:   Date:   Time:    Referred to Alternative Service(s):   Place:   Date:   Time:    Referred to Alternative Service(s):   Place:   Date:   Time:     Chancelor Hardrick S, LCAS  75 minutes non-face to face

## 2021-01-09 NOTE — Progress Notes (Signed)
[facility]  [address] [place], [place] [address] Phone: [phone]  Fax: [phone]   Clinic Day:  01/10/2021  Referring physician: Derinda Late, MD  Chief Complaint: Sean Garner. is a 75 y.o. 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  HPI:  The patient was last seen in the medical oncology clinic on 01/08/2021. At that time, he was feeling much better.  Swallowing had improved.  Hematocrit was 28.5, hemoglobin 10.1, MCV 88.0, platelets 80,000, WBC 3,800 (ANC 3400).  Immature platelet fraction was 3.7%. Platelet count in a blue top tube was 56,000. Sodium was 131. Glucose was 267. Ferritin was 424 with an iron saturation of 43% and a TIBC of 318. Vitamin B12 was 180 (low) and folate 10.3. TSH was 0.577. Chemotherapy was held secondary to thrombocytopenia.  The patient is currently receiving IMRT treatment to the tongue. He has received62factions thus far. The plan is for 70 Gy in 35 fractions. His last day of radiation is on 01/11/2021.  The patient spoke to JJennet Maduro RD on 01/08/2021 via telephone. The patient was eating better and using Magic Mouthwash. Encouraged the patient to continue eating high calorie, high protein foods.  He has received IVF over the past 2 days.  He started oral B12 on 02/x/2021.  During the interim, he has been "pretty good." His taste is starting to come back. He is eating much better. His shortness of breath has improved. He denies dizziness and lightheadedness. He thinks that receiving fluids has helped him.  He is not taking his blood pressure medication at this time. He started taking vitamin B12. He had some 5,000 mcg pills at home so he took one yesterday and one today. He is tolerating the increased dose of citalopram well.   He is being tapered off of  the Decadron prescribed by Dr. CBaruch Gouty He is taking 4 mg daily this week and is decreasing it to every other day next week.  The patient's wife notes that he was "staggery" while walking into clinic today.   Past Medical History:  Diagnosis Date  . Anxiety   . Cancer ([facility]    Head/throat Cancer at the base of the tongue  . Cataract    lt eye repair; present in rt eye but not ripe yet.  . Claustrophobia   . Diabetes mellitus without complication ([facility]   . GERD (gastroesophageal reflux disease)   . Glaucoma    using gtts for this.  . Hypercholesterolemia   . Hypertension     Past Surgical History:  Procedure Laterality Date  . CATARACT EXTRACTION W/PHACO Left 12/03/2017   Procedure: CATARACT EXTRACTION PHACO AND INTRAOCULAR LENS PLACEMENT ([place] COMPLICATED DIABETIC LEFT;  Surgeon: BLeandrew Koyanagi MD;  Location: [facility]  Service: Ophthalmology;  Laterality: Left;  Diabetic - oral meds  . CHOLECYSTECTOMY    . COLONOSCOPY  2016  . PORTA CATH INSERTION N/A 11/13/2020   Procedure: PORTA CATH INSERTION;  Surgeon: DAlgernon Huxley MD;  Location: [facility];  Service: Cardiovascular;  Laterality: N/A;    Family History  Problem Relation Age of Onset  . Cancer Mother   . Colon cancer Neg Hx   . Colon polyps Neg Hx   . Esophageal cancer Neg Hx   . Rectal cancer Neg Hx   . Stomach cancer  Neg Hx     Social History:  reports that he quit smoking about 36 years ago. He has never used smokeless tobacco. He reports previous alcohol use. He reports that he does not use drugs. He quit smoking cold [place] in 1986. He smoked 2 packs per day for 20 years. He drinks a glass of wine or a Margarita occasionally. 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 He was exposed to [employer]. He worked with a Geologist, engineering for 34 years. The patient is accompanied by his wife today.  Allergies: No Known Allergies  Current Medications: Current Outpatient  Medications  Medication Sig Dispense Refill  . ALPRAZolam (XANAX) 0.25 MG tablet Take 1 tablet (0.25 mg total) by mouth at bedtime as needed for anxiety. 60 tablet 0  . brimonidine-timolol (COMBIGAN) 0.2-0.5 % ophthalmic solution Place 1 drop into the left eye daily.    . calcium carbonate (OSCAL) 1500 (600 Ca) MG TABS tablet Take by mouth 2 (two) times daily with a meal.    . citalopram (CELEXA) 20 MG tablet Take 1.5 tablets (30 mg total) by mouth daily with breakfast. 45 tablet 0  . dexamethasone (DECADRON) 4 MG tablet Take 1 tablet (4 mg total) by mouth 2 (two) times daily with a meal. 60 tablet 0  . esomeprazole (NEXIUM) 20 MG capsule Take 40 mg by mouth daily at 12 noon.    . fluticasone (FLONASE) 50 MCG/ACT nasal spray Place into both nostrils.    Marland Kitchen lidocaine-prilocaine (EMLA) cream Apply to affected area once 30 g 3  . magic mouthwash w/lidocaine SOLN Take 5 mLs by mouth.    . simvastatin (ZOCOR) 40 MG tablet Take 40 mg by mouth daily at 6 PM.    . sucralfate (CARAFATE) 1 g tablet Take 0.5 tablets (0.5 g total) by mouth 3 (three) times daily. Dissolve in warm water, swish and swallow 60 tablet 1  . traZODone (DESYREL) 50 MG tablet Take 0.5-1 tablets (25-50 mg total) by mouth at bedtime as needed for sleep. 30 tablet 2  . VITAMIN D PO Take 800 Units by mouth daily.    Marland Kitchen dexamethasone (DECADRON) 4 MG tablet 2 TABS BY MOUTH DAILY. TAKE DAILY X 3 DAYS STARTING THE DAY AFTER CHEMOTHERAPY. TAKE WITH FOOD. (Patient not taking: No sig reported) 30 tablet 1  . glipiZIDE (GLUCOTROL XL) 5 MG 24 hr tablet Take 5 mg by mouth 2 (two) times daily. (Patient not taking: No sig reported)  1  . glucose blood test strip CHECK FASTING BLOOD SUGAR EVERY MORNING. (NEED SIGNATURE) (Patient not taking: Reported on 01/10/2021)    . losartan (COZAAR) 100 MG tablet Take 100 mg by mouth daily. (Patient not taking: Reported on 01/10/2021)    . metFORMIN (GLUCOPHAGE-XR) 500 MG 24 hr tablet Take 500 mg by mouth 2 (two)  times daily. Takes 2 tablets and 1 tablet in the pm (Patient not taking: No sig reported)    . ondansetron (ZOFRAN) 8 MG tablet Take 1 tablet (8 mg total) by mouth 2 (two) times daily as needed. Start on the third day after cisplatin chemotherapy. (Patient not taking: No sig reported) 30 tablet 1  . prochlorperazine (COMPAZINE) 10 MG tablet Take 1 tablet (10 mg total) by mouth every 6 (six) hours as needed (Nausea or vomiting). (Patient not taking: No sig reported) 30 tablet 1   Current Facility-Administered Medications  Medication Dose Route Frequency Provider Last Rate Last Admin  . 0.9 %  sodium chloride infusion  500 mL Intravenous Once Pyrtle, Lajuan Lines, MD       Facility-Administered Medications Ordered in Other Visits  Medication Dose Route Frequency Provider Last Rate Last Admin  . 0.9 % NaCl with KCl 20 mEq/ L  infusion   Intravenous Once Ryann Pauli C, MD      . CISplatin (PLATINOL) 87 mg in sodium chloride 0.9 % 250 mL chemo infusion  40 mg/m2 (Order-Specific) Intravenous Once Nolon Stalls C, MD      . dexamethasone (DECADRON) 10 mg in sodium chloride 0.9 % 50 mL IVPB  10 mg Intravenous Once Hiawatha Merriott C, MD      . fosaprepitant (EMEND) 150 mg in sodium chloride 0.9 % 145 mL IVPB  150 mg Intravenous Once Hazelene Doten C, MD      . magnesium sulfate IVPB 2 g 50 mL  2 g Intravenous Once Lequita Asal, MD        Review of Systems  Constitutional: Negative for chills, diaphoresis, fever, malaise/fatigue and weight loss (stable).  HENT: Positive for hearing loss and sore throat (improved). Negative for congestion, ear discharge, ear pain, nosebleeds, sinus pain and tinnitus.        Swallowing continues to improve  Eyes: Negative for blurred vision.  Respiratory: Positive for shortness of breath (with exertion, improving). Negative for cough, hemoptysis and sputum production.   Cardiovascular: Negative for chest pain, palpitations and leg swelling.   Gastrointestinal: Negative for abdominal pain, blood in stool, constipation, diarrhea, heartburn, melena, nausea and vomiting.  Genitourinary: Negative for dysuria, frequency, hematuria and urgency.  Musculoskeletal: Negative for back pain, joint pain, myalgias and neck pain.  Skin: Negative for itching and rash.  Neurological: Negative for dizziness, tingling, sensory change, weakness and headaches.  Endo/Heme/Allergies: Does not bruise/bleed easily.  Psychiatric/Behavioral: Negative for depression and memory loss. The patient is not nervous/anxious (on citalopram and Xanax) and does not have insomnia.   All other systems reviewed and are negative.  Performance status (ECOG): 1  Vitals Blood pressure (!) 158/72, pulse (!) 46, temperature (!) 96 F (35.6 C), temperature source Tympanic, resp. rate 20, weight 196 lb (88.9 kg), SpO2 97 %.   Physical Exam Vitals and nursing note reviewed.  Constitutional:      General: He is not in acute distress.    Appearance: He is not diaphoretic.  HENT:     Head: Normocephalic and atraumatic.     Comments: Gray hair.    Mouth/Throat:     Mouth: Mucous membranes are moist.     Pharynx: Oropharynx is clear.  Eyes:     General: No scleral icterus.    Extraocular Movements: Extraocular movements intact.     Conjunctiva/sclera: Conjunctivae normal.     Pupils: Pupils are equal, round, and reactive to light.     Comments: Blue eyes.  Cardiovascular:     Rate and Rhythm: Regular rhythm. Bradycardia present.     Heart sounds: Normal heart sounds. No murmur heard.   Pulmonary:     Effort: Pulmonary effort is normal. No respiratory distress.     Breath sounds: Normal breath sounds. No wheezing or rales.  Chest:     Chest wall: No tenderness.  Breasts:     Right: No axillary adenopathy or supraclavicular adenopathy.     Left: No axillary adenopathy or supraclavicular adenopathy.    Abdominal:     General: Bowel sounds are normal. There is no  distension.     Palpations: Abdomen is soft. There is  no mass.     Tenderness: There is no abdominal tenderness. There is no guarding or rebound.  Musculoskeletal:        General: No swelling or tenderness. Normal range of motion.     Cervical back: Normal range of motion and neck supple.  Lymphadenopathy:     Head:     Right side of head: No preauricular, posterior auricular or occipital adenopathy.     Left side of head: No preauricular, posterior auricular or occipital adenopathy.     Upper Body:     Right upper body: No supraclavicular or axillary adenopathy.     Left upper body: No supraclavicular or axillary adenopathy.     Lower Body: No right inguinal adenopathy. No left inguinal adenopathy.  Skin:    General: Skin is warm and dry.  Neurological:     Mental Status: He is alert and oriented to person, place, and time.  Psychiatric:        Behavior: Behavior normal.        Thought Content: Thought content normal.        Judgment: Judgment normal.    Appointment on 01/10/2021  Component Date Value Ref Range Status  . Magnesium 01/10/2021 1.8  1.7 - 2.4 mg/dL Final   Performed at [facility], [address], [place], [place] [address]  . Sodium 01/10/2021 131* 135 - 145 mmol/L Final  . Potassium 01/10/2021 3.8  3.5 - 5.1 mmol/L Final  . Chloride 01/10/2021 97* 98 - 111 mmol/L Final  . CO2 01/10/2021 28  22 - 32 mmol/L Final  . Glucose, Bld 01/10/2021 119* 70 - 99 mg/dL Final   Glucose reference range applies only to samples taken after fasting for at least 8 hours.  . BUN 01/10/2021 22  8 - 23 mg/dL Final  . Creatinine, Ser 01/10/2021 0.97  0.61 - 1.24 mg/dL Final  . Calcium 01/10/2021 8.9  8.9 - 10.3 mg/dL Final  . Total Protein 01/10/2021 5.6* 6.5 - 8.1 g/dL Final  . Albumin 01/10/2021 3.2* 3.5 - 5.0 g/dL Final  . AST 01/10/2021 15  15 - 41 U/L Final  . ALT 01/10/2021 20  0 - 44 U/L Final  . Alkaline Phosphatase 01/10/2021 40  38 - 126 U/L Final  . Total  Bilirubin 01/10/2021 0.7  0.3 - 1.2 mg/dL Final  . GFR, Estimated 01/10/2021 >60  >60 mL/min Final   Comment: (NOTE) Calculated using the CKD-EPI Creatinine Equation (2021)   . Anion gap 01/10/2021 6  5 - 15 Final   Performed at [facility], [address], [place], [place] [address]  . WBC 01/10/2021 3.0* 4.0 - 10.5 K/uL Final  . RBC 01/10/2021 3.14* 4.22 - 5.81 MIL/uL Final  . Hemoglobin 01/10/2021 9.7* 13.0 - 17.0 g/dL Final  . HCT 01/10/2021 27.8* 39.0 - 52.0 % Final  . MCV 01/10/2021 88.5  80.0 - 100.0 fL Final  . MCH 01/10/2021 30.9  26.0 - 34.0 pg Final  . MCHC 01/10/2021 34.9  30.0 - 36.0 g/dL Final  . RDW 01/10/2021 15.9* 11.5 - 15.5 % Final  . Platelets 01/10/2021 86* 150 - 400 K/uL Final   Comment: Immature Platelet Fraction may be clinically indicated, consider ordering this additional test [id number] PLATELET COUNT CONFIRMED BY SMEAR   . nRBC 01/10/2021 1.0* 0.0 - 0.2 % Final  . Neutrophils Relative % 01/10/2021 82  % Final  . Neutro Abs 01/10/2021 2.4  1.7 - 7.7 K/uL Final  .  Lymphocytes Relative 01/10/2021 8  % Final  . Lymphs Abs 01/10/2021 0.2* 0.7 - 4.0 K/uL Final  . Monocytes Relative 01/10/2021 9  % Final  . Monocytes Absolute 01/10/2021 0.3  0.1 - 1.0 K/uL Final  . Eosinophils Relative 01/10/2021 0  % Final  . Eosinophils Absolute 01/10/2021 0.0  0.0 - 0.5 K/uL Final  . Basophils Relative 01/10/2021 0  % Final  . Basophils Absolute 01/10/2021 0.0  0.0 - 0.1 K/uL Final  . Immature Granulocytes 01/10/2021 1  % Final  . Abs Immature Granulocytes 01/10/2021 0.04  0.00 - 0.07 K/uL Final   Performed at [facility], [address], [place], [place] [address]  Infusion on 01/08/2021  Component Date Value Ref Range Status  . Magnesium 01/08/2021 1.8  1.7 - 2.4 mg/dL Final   Performed at [facility], [address] [place], [place] [address]  . Sodium 01/08/2021 131* 135 - 145 mmol/L Final  . Potassium 01/08/2021 4.2  3.5 -  5.1 mmol/L Final  . Chloride 01/08/2021 95* 98 - 111 mmol/L Final  . CO2 01/08/2021 25  22 - 32 mmol/L Final  . Glucose, Bld 01/08/2021 267* 70 - 99 mg/dL Final   Glucose reference range applies only to samples taken after fasting for at least 8 hours.  . BUN 01/08/2021 27* 8 - 23 mg/dL Final  . Creatinine, Ser 01/08/2021 0.98  0.61 - 1.24 mg/dL Final  . Calcium 01/08/2021 9.1  8.9 - 10.3 mg/dL Final  . Total Protein 01/08/2021 6.1* 6.5 - 8.1 g/dL Final  . Albumin 01/08/2021 3.6  3.5 - 5.0 g/dL Final  . AST 01/08/2021 17  15 - 41 U/L Final  . ALT 01/08/2021 22  0 - 44 U/L Final  . Alkaline Phosphatase 01/08/2021 42  38 - 126 U/L Final  . Total Bilirubin 01/08/2021 0.7  0.3 - 1.2 mg/dL Final  . GFR, Estimated 01/08/2021 >60  >60 mL/min Final   Comment: (NOTE) Calculated using the CKD-EPI Creatinine Equation (2021)   . Anion gap 01/08/2021 11  5 - 15 Final   Performed at [facility], [address] [place], [place] [address]  . WBC 01/08/2021 3.8* 4.0 - 10.5 K/uL Final  . RBC 01/08/2021 3.24* 4.22 - 5.81 MIL/uL Final  . Hemoglobin 01/08/2021 10.1* 13.0 - 17.0 g/dL Final  . HCT 01/08/2021 28.5* 39.0 - 52.0 % Final  . MCV 01/08/2021 88.0  80.0 - 100.0 fL Final  . MCH 01/08/2021 31.2  26.0 - 34.0 pg Final  . MCHC 01/08/2021 35.4  30.0 - 36.0 g/dL Final  . RDW 01/08/2021 15.4  11.5 - 15.5 % Final  . Platelets 01/08/2021 80* 150 - 400 K/uL Final   Comment: Immature Platelet Fraction may be clinically indicated, consider ordering this additional test [id number]   . nRBC 01/08/2021 0.0  0.0 - 0.2 % Final  . Neutrophils Relative % 01/08/2021 91  % Final  . Neutro Abs 01/08/2021 3.4  1.7 - 7.7 K/uL Final  . Lymphocytes Relative 01/08/2021 3  % Final  . Lymphs Abs 01/08/2021 0.1* 0.7 - 4.0 K/uL Final  . Monocytes Relative 01/08/2021 5  % Final  . Monocytes Absolute 01/08/2021 0.2  0.1 - 1.0 K/uL Final  . Eosinophils Relative 01/08/2021 0  % Final  . Eosinophils Absolute  01/08/2021 0.0  0.0 - 0.5 K/uL Final  . Basophils Relative 01/08/2021 0  % Final  . Basophils Absolute 01/08/2021 0.0  0.0 -  0.1 K/uL Final  . Immature Granulocytes 01/08/2021 1  % Final  . Abs Immature Granulocytes 01/08/2021 0.03  0.00 - 0.07 K/uL Final   Performed at [facility], [address], [place], [place] [address]  . Immature Platelet Fraction 01/08/2021 3.7  1.2 - 8.6 % Final   Performed at [facility], [address], [place], [place] [address]  . Iron 01/08/2021 135  45 - 182 ug/dL Final  . TIBC 01/08/2021 318  250 - 450 ug/dL Final  . Saturation Ratios 01/08/2021 43* 17.9 - 39.5 % Final  . UIBC 01/08/2021 183  ug/dL Final   Performed at [facility], [address] [place], [place] [address]  . Ferritin 01/08/2021 424* 24 - 336 ng/mL Final   Performed at [facility], [address] [place], [place] [address]  . Platelet CT in [place] 01/08/2021 56   Final   Performed at [facility], [address], [place], [place] [address]  . TSH 01/08/2021 0.577  0.350 - 4.500 uIU/mL Final   Comment: Performed by a 3rd Generation assay with a functional sensitivity of <=0.01 uIU/mL. Performed at [facility], [address], [place], [place] [address]   . Folate 01/08/2021 10.3  >5.9 ng/mL Final   Performed at [facility], [address] [place], [place] [address]  . Vitamin B-12 01/08/2021 180  180 - 914 pg/mL Final   Comment: (NOTE) This assay is not validated for testing neonatal or myeloproliferative syndrome specimens for Vitamin B12 levels. Performed at [facility], [place] [address] [place], [place] [address]     Assessment:  Sean Garner. is a 75 y.o. 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  He presented with right neck adenopathy.    Neck soft tissue CT on 09/28/2020 revealed a 3.5 x 3.0 x 4.8 cm right neck mass most c/w enlarged lymph node. There was a 14 mm enhancing mass in the right base of tongue.   PET scan on 10/25/2020 revealed right tongue base primary with ipsilateral level 2 nodal metastasis. There were no findings of extracervical metastatic disease. There was focal hypermetabolism in the distal esophagus, with possible concurrent soft tissue density lesion. Recommended correlation with endoscopy. There was vague right lower lobe low-level hypermetabolism and ground-glass nodularity, favored minimal infection or aspiration. Incidental findings included aortic atherosclerosis, coronary artery atherosclerosis, emphysema, a tiny hiatal hernia, and prostatomegaly.  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  Speech discrimination scores were 100% in each ear.  He is s/p week #6 cisplatin and concurrent radiation (11/20/2020 - 12/27/2020).  He is tolerating treatment well.  Radiation-induced mucositis is improving.   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  Upper endoscopy on 11/03/2020 revealed a malignant esophageal tumor at the gastroesophageal junction. There were esophageal mucosal changes c/w short-segment Barrett's esophagus. There was a 2 cm hiatal hernia. There was gastritis.  Pathology in the esophagus at 36 cm revealed intramucosal adenocarcinoma arising in a background of high grade dysplasia and intestinal metaplasia; pathology at 38 cm revealed adenocarcinoma.  EUS on 11/15/2020 revealed early stage adenocarcinoma arising from Barrett's esophagus.  Lesion was T1bN0 and non-obstructing.  He has B12 deficiency.  B12 was  180 on 01/08/2021.  He has PTSD and clastrophobia.  Anxiety is relieved by alprazolam 0.25 mg prior to radiation.  He is on citalopram 20 mg a day and trazodone at  night.  Symptomatically, he feels "pretty good." His taste is starting to come back. He is eating much better. His shortness of breath has improved.  Exam continues to improve weekly.  WBC 3000 with an ANC of 2400.  Platelets are 86,000.  Plan: 1.   Labs today: CBC with diff, CMP, Mg. 2. Clinical stage I right base of tongue carcinoma Clinical stage I. 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 PET scan on 10/25/2020 revealed a right base of tongue primary with ipsilateral level 2 nodal metastasis. No evidence of distant metastasis. Plan: cisplatin 40 mg/m2 weekly x 7 with radiation.             He began concurrent radiation and cisplatin on 11/20/2020.  Clinically, he is doing well.  He is eating much better..   Neck adenopathy has decreased in size.  Labs reviewed.  Postpone week #7 cisplatin.   Platelet count remains low.  WBC is low.   Etiology likely secondary to myelosuppression.  Patient has received >= 6 cycles of cisplatin dose of > 200 mg/m2.   Discuss patient has met throats threshold for adequate chemotherapy.  Discuss symptom management.  He has antiemeticsat home to use on a prn bases.  Interventions are adequate.    3. 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 EUS on 11/15/2020 revealed a T1bN0 lesion.  He may be a candidate for endoscopic resection followed by ablation or esophagectomy.  Assist with follow-up appointment with Dr. Elenor Quinones at [facility]. 4.   Mucositis  Mucositis continues to improve weekly.  He is able to eat and drink better.  Weight is stable. 5. Anxiety  Anxiety is controlled on his current regimen 6.   Hyponatremia             Sodium 131.              Continue to encourage fluids with electrolytes. 7.   Bradycardia  Etiology is unknown.  Contact Dr Baldemar Lenis. 8.   No cisplatin today. 9.   MD:  Contact Dr Baruch Gouty re:  Last week of cisplatin. 10.   RTC on 03/01 for MD assess, labs (CBC with diff, BMP, Mg), and +/- cisplatin.  I discussed the assessment and treatment plan with the patient.  The patient was provided an opportunity to ask questions and all were answered.  The patient agreed with the plan and demonstrated an understanding of the instructions.  The patient was advised to call back if the symptoms worsen or if the condition fails to improve as anticipated.  I provided 17 minutes of face-to-face time during this this encounter and > 50% was spent counseling as documented under my assessment and plan.  An additional 5 minutes were spent reviewing his chart (Epic and Care Everywhere) including notes, labs, and imaging studies.   Jamyson Jirak C. Mike Gip, MD, PhD    01/10/2021, 10:09 AM   I, Mirian Mo Tufford, am acting as Education administrator for [employer]. Mike Gip, MD, PhD.  I, Jerin Franzel C. Mike Gip, MD, have reviewed the above documentation for accuracy and completeness, and I agree with the above.

## 2021-01-10 ENCOUNTER — Ambulatory Visit: Payer: No Typology Code available for payment source

## 2021-01-10 ENCOUNTER — Other Ambulatory Visit: Payer: Self-pay

## 2021-01-10 ENCOUNTER — Ambulatory Visit: Payer: Medicare Other

## 2021-01-10 ENCOUNTER — Ambulatory Visit
Admission: RE | Admit: 2021-01-10 | Discharge: 2021-01-10 | Disposition: A | Discharge status: Home / Self Care | Payer: No Typology Code available for payment source | Source: Ambulatory Visit | Attending: Radiation Oncology | Admitting: Radiation Oncology

## 2021-01-10 ENCOUNTER — Telehealth: Payer: Self-pay

## 2021-01-10 ENCOUNTER — Other Ambulatory Visit: Payer: Self-pay | Admitting: Hematology and Oncology

## 2021-01-10 ENCOUNTER — Inpatient Hospital Stay: Payer: No Typology Code available for payment source

## 2021-01-10 ENCOUNTER — Inpatient Hospital Stay (HOSPITAL_BASED_OUTPATIENT_CLINIC_OR_DEPARTMENT_OTHER): Payer: No Typology Code available for payment source | Admitting: Hematology and Oncology

## 2021-01-10 ENCOUNTER — Encounter: Payer: Self-pay | Admitting: Hematology and Oncology

## 2021-01-10 VITALS — BP 158/72 | HR 46 | Temp 96.0°F | Resp 20

## 2021-01-10 VITALS — BP 158/72 | HR 46 | Temp 96.0°F | Resp 20 | Wt 196.0 lb

## 2021-01-10 DIAGNOSIS — C01 Malignant neoplasm of base of tongue: Secondary | ICD-10-CM

## 2021-01-10 DIAGNOSIS — R001 Bradycardia, unspecified: Secondary | ICD-10-CM

## 2021-01-10 DIAGNOSIS — Z510 Encounter for antineoplastic radiation therapy: Secondary | ICD-10-CM | POA: Diagnosis not present

## 2021-01-10 DIAGNOSIS — E538 Deficiency of other specified B group vitamins: Secondary | ICD-10-CM

## 2021-01-10 DIAGNOSIS — C159 Malignant neoplasm of esophagus, unspecified: Secondary | ICD-10-CM | POA: Diagnosis not present

## 2021-01-10 LAB — CBC WITH DIFFERENTIAL/PLATELET
Abs Immature Granulocytes: 0.04 10*3/uL (ref 0.00–0.07)
Basophils Absolute: 0 10*3/uL (ref 0.0–0.1)
Basophils Relative: 0 %
Eosinophils Absolute: 0 10*3/uL (ref 0.0–0.5)
Eosinophils Relative: 0 %
HCT: 27.8 % — ABNORMAL LOW (ref 39.0–52.0)
Hemoglobin: 9.7 g/dL — ABNORMAL LOW (ref 13.0–17.0)
Immature Granulocytes: 1 %
Lymphocytes Relative: 8 %
Lymphs Abs: 0.2 10*3/uL — ABNORMAL LOW (ref 0.7–4.0)
MCH: 30.9 pg (ref 26.0–34.0)
MCHC: 34.9 g/dL (ref 30.0–36.0)
MCV: 88.5 fL (ref 80.0–100.0)
Monocytes Absolute: 0.3 10*3/uL (ref 0.1–1.0)
Monocytes Relative: 9 %
Neutro Abs: 2.4 10*3/uL (ref 1.7–7.7)
Neutrophils Relative %: 82 %
Platelets: 86 10*3/uL — ABNORMAL LOW (ref 150–400)
RBC: 3.14 MIL/uL — ABNORMAL LOW (ref 4.22–5.81)
RDW: 15.9 % — ABNORMAL HIGH (ref 11.5–15.5)
WBC: 3 10*3/uL — ABNORMAL LOW (ref 4.0–10.5)
nRBC: 1 % — ABNORMAL HIGH (ref 0.0–0.2)

## 2021-01-10 LAB — COMPREHENSIVE METABOLIC PANEL
ALT: 20 U/L (ref 0–44)
AST: 15 U/L (ref 15–41)
Albumin: 3.2 g/dL — ABNORMAL LOW (ref 3.5–5.0)
Alkaline Phosphatase: 40 U/L (ref 38–126)
Anion gap: 6 (ref 5–15)
BUN: 22 mg/dL (ref 8–23)
CO2: 28 mmol/L (ref 22–32)
Calcium: 8.9 mg/dL (ref 8.9–10.3)
Chloride: 97 mmol/L — ABNORMAL LOW (ref 98–111)
Creatinine, Ser: 0.97 mg/dL (ref 0.61–1.24)
GFR, Estimated: >60 mL/min (ref >60)
Glucose, Bld: 119 mg/dL — ABNORMAL HIGH (ref 70–99)
Potassium: 3.8 mmol/L (ref 3.5–5.1)
Sodium: 131 mmol/L — ABNORMAL LOW (ref 135–145)
Total Bilirubin: 0.7 mg/dL (ref 0.3–1.2)
Total Protein: 5.6 g/dL — ABNORMAL LOW (ref 6.5–8.1)

## 2021-01-10 LAB — MAGNESIUM: Magnesium: 1.8 mg/dL (ref 1.7–2.4)

## 2021-01-10 MED ORDER — HEPARIN SOD (PORK) LOCK FLUSH 100 UNIT/ML IV SOLN
500.0000 [IU] | Freq: Once | INTRAVENOUS | Status: AC
  Administered 2021-01-10: 500 [IU] via INTRAVENOUS
  Filled 2021-01-10: qty 5

## 2021-01-10 NOTE — Telephone Encounter (Signed)
Called Dr. Florentina Jenny office to inquire about getting his appointment scheduled. Sean Garner has also reported that they attempted to schedule with radiation physician. They were previously notified that he already had a medical and radiation oncologist and only needs to be seen by surgery. They are sending referral back over to arrange appointment. I will follow up.

## 2021-01-11 ENCOUNTER — Inpatient Hospital Stay: Payer: No Typology Code available for payment source

## 2021-01-11 ENCOUNTER — Ambulatory Visit: Payer: Medicare Other | Admitting: Licensed Clinical Social Worker

## 2021-01-11 ENCOUNTER — Ambulatory Visit
Admission: RE | Admit: 2021-01-11 | Discharge: 2021-01-11 | Disposition: A | Discharge status: Home / Self Care | Payer: No Typology Code available for payment source | Source: Ambulatory Visit | Attending: Radiation Oncology | Admitting: Radiation Oncology

## 2021-01-11 DIAGNOSIS — Z87891 Personal history of nicotine dependence: Secondary | ICD-10-CM | POA: Diagnosis not present

## 2021-01-11 DIAGNOSIS — Z515 Encounter for palliative care: Secondary | ICD-10-CM | POA: Diagnosis not present

## 2021-01-11 DIAGNOSIS — Z510 Encounter for antineoplastic radiation therapy: Secondary | ICD-10-CM | POA: Diagnosis not present

## 2021-01-11 DIAGNOSIS — C160 Malignant neoplasm of cardia: Secondary | ICD-10-CM | POA: Diagnosis not present

## 2021-01-11 DIAGNOSIS — C01 Malignant neoplasm of base of tongue: Secondary | ICD-10-CM | POA: Diagnosis not present

## 2021-01-11 DIAGNOSIS — C770 Secondary and unspecified malignant neoplasm of lymph nodes of head, face and neck: Secondary | ICD-10-CM | POA: Diagnosis not present

## 2021-01-11 MED ORDER — HEPARIN SOD (PORK) LOCK FLUSH 100 UNIT/ML IV SOLN
500.0000 [IU] | Freq: Once | INTRAVENOUS | Status: AC | PRN
Start: 2021-01-11 — End: 2021-01-11
  Administered 2021-01-11: 500 [IU]
  Filled 2021-01-11: qty 5

## 2021-01-11 MED ORDER — SODIUM CHLORIDE 0.9% FLUSH
10.0000 mL | Freq: Once | INTRAVENOUS | Status: AC | PRN
  Administered 2021-01-11: 10 mL
  Filled 2021-01-11: qty 10

## 2021-01-11 MED ORDER — SODIUM CHLORIDE 0.9 % IV SOLN
Freq: Once | INTRAVENOUS | Status: AC
  Filled 2021-01-11: qty 250

## 2021-01-11 NOTE — Progress Notes (Signed)
Receiving IV fluids today for hydration. States he is feeling better today. No complaints at time of discharge.

## 2021-01-12 ENCOUNTER — Telehealth: Payer: Self-pay | Admitting: *Deleted

## 2021-01-12 ENCOUNTER — Inpatient Hospital Stay: Payer: No Typology Code available for payment source

## 2021-01-12 ENCOUNTER — Inpatient Hospital Stay (HOSPITAL_BASED_OUTPATIENT_CLINIC_OR_DEPARTMENT_OTHER): Payer: No Typology Code available for payment source | Admitting: Hospice and Palliative Medicine

## 2021-01-12 DIAGNOSIS — C01 Malignant neoplasm of base of tongue: Secondary | ICD-10-CM

## 2021-01-12 DIAGNOSIS — Z515 Encounter for palliative care: Secondary | ICD-10-CM | POA: Diagnosis not present

## 2021-01-12 MED ORDER — TRAZODONE HCL 50 MG PO TABS: 25.0000 mg | ORAL_TABLET | Freq: Every evening | ORAL | 2 refills | Status: DC | PRN

## 2021-01-12 MED ORDER — ALPRAZOLAM 0.25 MG PO TABS: 0.2500 mg | ORAL_TABLET | Freq: Every evening | ORAL | 0 refills | Status: DC | PRN

## 2021-01-12 MED ORDER — SODIUM CHLORIDE 0.9 % IV SOLN
Freq: Once | INTRAVENOUS | Status: AC
  Filled 2021-01-12: qty 250

## 2021-01-12 MED ORDER — HEPARIN SOD (PORK) LOCK FLUSH 100 UNIT/ML IV SOLN
500.0000 [IU] | Freq: Once | INTRAVENOUS | Status: AC | PRN
  Administered 2021-01-12: 500 [IU]
  Filled 2021-01-12: qty 5

## 2021-01-12 MED ORDER — SODIUM CHLORIDE 0.9% FLUSH
10.0000 mL | Freq: Once | INTRAVENOUS | Status: AC | PRN
  Administered 2021-01-12: 10 mL
  Filled 2021-01-12: qty 10

## 2021-01-12 NOTE — Telephone Encounter (Signed)
  Called and left a voicemail message.  M

## 2021-01-12 NOTE — Progress Notes (Signed)
Pt received 1 hour of NS today. Doing better. Discharged to home VSS.

## 2021-01-12 NOTE — Progress Notes (Signed)
[place]  [phone] [phone]   Name: Sean Garner. Date: 01/12/2021 MRN: [id number]  DOB: [birth year]  Patient Care Team: Derinda Late, MD as PCP - General (Family Medicine) Lequita Asal, MD as Referring Physician (Hematology and Oncology) Beverly Gust, MD (Otolaryngology) Pyrtle, Lajuan Lines, MD as Consulting Physician (Gastroenterology) Clapacs, Madie Reno, MD (Psychiatry)    REASON FOR CONSULTATION: Sean Garner. is a 75 y.o. 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  Patient has severe claustrophobia limiting work-up as he refused PET/CT.  Patient has had severe anxiety and was referred to palliative care to help address goals and manage ongoing symptoms.   SOCIAL HISTORY:     reports that he quit smoking about 36 years ago. He has never used smokeless tobacco. He reports previous alcohol use. He reports that he does not use drugs.  Patient is married and lives at home with his wife.  He has a daughter who lives nearby.  Patient is a [place] veteran and has PTSD.  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  ADVANCE DIRECTIVES:  Not on file  CODE STATUS:   PAST MEDICAL HISTORY: Past Medical History:  Diagnosis Date  . Anxiety   . Cancer [facility])    Head/throat Cancer at the base of the tongue  . Cataract    lt eye repair; present in rt eye but not ripe yet.  . Claustrophobia   . Diabetes mellitus without complication ([place])   . GERD (gastroesophageal reflux disease)   . Glaucoma    using gtts for this.  . Hypercholesterolemia   . Hypertension     PAST SURGICAL HISTORY:  Past Surgical History:  Procedure Laterality Date  . CATARACT EXTRACTION W/PHACO Left 12/03/2017   Procedure: CATARACT EXTRACTION PHACO AND INTRAOCULAR LENS  PLACEMENT ([place]) COMPLICATED DIABETIC LEFT;  Surgeon: Leandrew Koyanagi, MD;  Location: [facility];  Service: Ophthalmology;  Laterality: Left;  Diabetic - oral meds  . CHOLECYSTECTOMY    . COLONOSCOPY  2016  . PORTA CATH INSERTION N/A 11/13/2020   Procedure: PORTA CATH INSERTION;  Surgeon: Algernon Huxley, MD;  Location: [place] [facility];  Service: Cardiovascular;  Laterality: N/A;    HEMATOLOGY/ONCOLOGY HISTORY:  Oncology History  Carcinoma of base of tongue ([place])  10/17/2020 Initial Diagnosis   Carcinoma of base of tongue ([place])   11/20/2020 -  Chemotherapy    Patient is on Treatment Plan: HEAD/NECK CISPLATIN Q7D        ALLERGIES:  has No Known Allergies.  MEDICATIONS:  Current Outpatient Medications  Medication Sig Dispense Refill  . ALPRAZolam (XANAX) 0.25 MG tablet Take 1 tablet (0.25 mg total) by mouth at bedtime as needed for anxiety. 60 tablet 0  . brimonidine-timolol (COMBIGAN) 0.2-0.5 % ophthalmic solution Place 1 drop into the left eye daily.    . calcium carbonate (OSCAL) 1500 (600 Ca) MG TABS tablet Take by mouth 2 (two) times daily with a meal.    . citalopram (CELEXA) 20 MG tablet Take 1.5 tablets (30 mg total) by mouth daily with breakfast. 45 tablet 0  . dexamethasone (DECADRON) 4 MG tablet 2 TABS BY MOUTH DAILY. TAKE DAILY X 3 DAYS STARTING THE DAY AFTER CHEMOTHERAPY. TAKE WITH FOOD. (Patient not taking: No sig reported) 30 tablet 1  . dexamethasone (DECADRON)  4 MG tablet Take 1 tablet (4 mg total) by mouth 2 (two) times daily with a meal. 60 tablet 0  . esomeprazole (NEXIUM) 20 MG capsule Take 40 mg by mouth daily at 12 noon.    . fluticasone (FLONASE) 50 MCG/ACT nasal spray Place into both nostrils.    Marland Kitchen glipiZIDE (GLUCOTROL XL) 5 MG 24 hr tablet Take 5 mg by mouth 2 (two) times daily. (Patient not taking: No sig reported)  1  . glucose blood test strip CHECK FASTING BLOOD SUGAR EVERY MORNING. (NEED SIGNATURE) (Patient not taking: Reported on 01/10/2021)     . lidocaine-prilocaine (EMLA) cream Apply to affected area once 30 g 3  . losartan (COZAAR) 100 MG tablet Take 100 mg by mouth daily. (Patient not taking: Reported on 01/10/2021)    . magic mouthwash w/lidocaine SOLN Take 5 mLs by mouth.    . metFORMIN (GLUCOPHAGE-XR) 500 MG 24 hr tablet Take 500 mg by mouth 2 (two) times daily. Takes 2 tablets and 1 tablet in the pm (Patient not taking: No sig reported)    . ondansetron (ZOFRAN) 8 MG tablet Take 1 tablet (8 mg total) by mouth 2 (two) times daily as needed. Start on the third day after cisplatin chemotherapy. (Patient not taking: No sig reported) 30 tablet 1  . prochlorperazine (COMPAZINE) 10 MG tablet Take 1 tablet (10 mg total) by mouth every 6 (six) hours as needed (Nausea or vomiting). (Patient not taking: No sig reported) 30 tablet 1  . simvastatin (ZOCOR) 40 MG tablet Take 40 mg by mouth daily at 6 PM.    . sucralfate (CARAFATE) 1 g tablet Take 0.5 tablets (0.5 g total) by mouth 3 (three) times daily. Dissolve in warm water, swish and swallow 60 tablet 1  . traZODone (DESYREL) 50 MG tablet Take 0.5-1 tablets (25-50 mg total) by mouth at bedtime as needed for sleep. 30 tablet 2  . VITAMIN D PO Take 800 Units by mouth daily.     Current Facility-Administered Medications  Medication Dose Route Frequency Provider Last Rate Last Admin  . 0.9 %  sodium chloride infusion  500 mL Intravenous Once Pyrtle, Lajuan Lines, MD       Facility-Administered Medications Ordered in Other Visits  Medication Dose Route Frequency Provider Last Rate Last Admin  . 0.9 % NaCl with KCl 20 mEq/ L  infusion   Intravenous Once Corcoran, Melissa C, MD      . CISplatin (PLATINOL) 87 mg in sodium chloride 0.9 % 250 mL chemo infusion  40 mg/m2 (Order-Specific) Intravenous Once Nolon Stalls C, MD      . dexamethasone (DECADRON) 10 mg in sodium chloride 0.9 % 50 mL IVPB  10 mg Intravenous Once Corcoran, Melissa C, MD      . fosaprepitant (EMEND) 150 mg in sodium chloride  0.9 % 145 mL IVPB  150 mg Intravenous Once Corcoran, Melissa C, MD      . heparin lock flush 100 unit/mL  500 Units Intracatheter Once PRN Corcoran, Melissa C, MD      . magnesium sulfate IVPB 2 g 50 mL  2 g Intravenous Once Lequita Asal, MD        VITAL SIGNS: There were no vitals taken for this visit. There were no vitals filed for this visit.  Estimated body mass index is 27.73 kg/m as calculated from the following:   Height as of 01/08/21: 5' 10.5" (1.791 m).   Weight as of 01/10/21: 196 lb (88.9 kg).  LABS: CBC:    Component Value Date/Time   WBC 3.0 (L) 01/10/2021 0901   HGB 9.7 (L) 01/10/2021 0901   HGB 12.3 (L) 10/01/2013 0508   HCT 27.8 (L) 01/10/2021 0901   HCT 35.7 (L) 10/01/2013 0508   PLT 86 (L) 01/10/2021 0901   PLT 302 10/01/2013 0508   MCV 88.5 01/10/2021 0901   MCV 89 10/01/2013 0508   NEUTROABS 2.4 01/10/2021 0901   NEUTROABS 9.8 (H) 10/01/2013 0508   LYMPHSABS 0.2 (L) 01/10/2021 0901   LYMPHSABS 1.6 10/01/2013 0508   MONOABS 0.3 01/10/2021 0901   MONOABS 0.9 10/01/2013 0508   EOSABS 0.0 01/10/2021 0901   EOSABS 0.1 10/01/2013 0508   BASOSABS 0.0 01/10/2021 0901   BASOSABS 0.1 10/01/2013 0508   Comprehensive Metabolic Panel:    Component Value Date/Time   NA 131 (L) 01/10/2021 0901   NA 135 (L) 09/29/2013 0534   K 3.8 01/10/2021 0901   K 3.8 09/29/2013 0534   CL 97 (L) 01/10/2021 0901   CL 101 09/29/2013 0534   CO2 28 01/10/2021 0901   CO2 29 09/29/2013 0534   BUN 22 01/10/2021 0901   BUN 12 09/29/2013 0534   CREATININE 0.97 01/10/2021 0901   CREATININE 1.15 09/29/2013 0534   GLUCOSE 119 (H) 01/10/2021 0901   GLUCOSE 258 (H) 09/29/2013 0534   CALCIUM 8.9 01/10/2021 0901   CALCIUM 8.7 09/29/2013 0534   AST 15 01/10/2021 0901   AST 19 10/01/2013 0508   ALT 20 01/10/2021 0901   ALT 29 10/01/2013 0508   ALKPHOS 40 01/10/2021 0901   ALKPHOS 76 10/01/2013 0508   BILITOT 0.7 01/10/2021 0901   BILITOT 0.6 10/01/2013 0508   PROT 5.6 (L)  01/10/2021 0901   PROT 5.7 (L) 10/01/2013 0508   ALBUMIN 3.2 (L) 01/10/2021 0901   ALBUMIN 2.4 (L) 10/01/2013 0508    RADIOGRAPHIC STUDIES: No results found.  PERFORMANCE STATUS (ECOG) : 0 - Asymptomatic  Review of Systems Unless otherwise noted, a complete review of systems is negative.  Physical Exam General: NAD Pulmonary: Unlabored Extremities: no edema, no joint deformities Skin: no rashes Neurological: Grossly nonfocal  IMPRESSION: Routine follow-up visit.  Patient was seen while he is receiving IV fluids.  Patient reports that he is doing well.  He says "the anxiety is 99% better."  Patient saw psychiatrist on 01/01/2021 with plan to increase his Celexa to 30 mg daily and continue alprazolam as needed.  Patient reports sparing use of alprazolam but still takes it prior to treatments.  He does request refill of alprazolam, which I will do today.  However, I suggested future refills would be best served by psychiatry.  Patient denies any other symptomatic complaints.  He is pleased with the progress of his cancer treatment and plan for surgical evaluation.  PLAN: -Continue current scope of treatment -Continue citalopram and alprazolam per psychiatry -Refill alprazolam 0.25 mg #60 -Continue trazodone 50 mg as needed for sleep -RTC as needed  Patient expressed understanding and was in agreement with this plan. He also understands that He can call the clinic at any time with any questions, concerns, or complaints.     Time Total: 15 minutes  Visit consisted of counseling and education dealing with the complex and emotionally intense issues of symptom management and palliative care in the setting of serious and potentially life-threatening illness.Greater than 50%  of this time was spent counseling and coordinating care related to the above assessment and plan.  Signed by: Vonna Kotyk  Sean Mariani, PhD, NP-C

## 2021-01-12 NOTE — Telephone Encounter (Signed)
Kim from the [place] in [place] called stating that the patient told her that Dr Mike Gip has referred him to a surgeon at [facility]. She states that Dr Mike Gip HAS to get authorization to do this and make sure that doctor is in network. Request will need to be sent along with office notes supporting why referral is made. Maudie Mercury can be reached at [phone] until 4 PM today.

## 2021-01-15 ENCOUNTER — Telehealth: Payer: Self-pay

## 2021-01-15 NOTE — Progress Notes (Signed)
[facility]  [address], [address] [place], [place] [address] Phone: [phone]  Fax: [phone]   Clinic Day:  01/16/2021  Referring physician: Derinda Late, MD  Chief Complaint: Sean Garner. is a 75 y.o. 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  HPI:  The patient was last seen in the medical oncology clinic on 01/12/2021. At that time, he felt "pretty good".  He was eating much better.  He was on a Decadron taper per Dr. Baruch Gouty.  CBC revealed a hematocrit of 27.8, hemoglobin 9.7, MCV 88.5, platelets 86,000, WBC 3000 with an [place] of 2400. Chemotherapy was held secondary to persistent thrombocytopenia. He received IV fluids.  The patient completed IMRT treatment to the tongue on 01/11/2021. He received 70 Gy in 35 fractions.  During the interim, he has been "good." He has been eating well, drinking well, and his energy has been good. He drinks 2 Boosts/Ensures per day. He uses magic mouthwash and his throat is not bothering him very much today. His throat was hurting over the weekend. He denies nausea, vomiting, and diarrhea.   He was a little bit tired on Saturday but did not even take a nap on Sunday. He went shopping yesterday and was tired by the end of the day.  He remains off of his blood pressure medication.   Past Medical History:  Diagnosis Date  . Anxiety   . Cancer [facility])    Head/throat Cancer at the base of the tongue  . Cataract    lt eye repair; present in rt eye but not ripe yet.  . Claustrophobia   . Diabetes mellitus without complication ([place])   . GERD (gastroesophageal reflux disease)   . Glaucoma    using gtts for this.  . Hypercholesterolemia   . Hypertension     Past Surgical History:  Procedure Laterality Date  . CATARACT EXTRACTION W/PHACO Left 12/03/2017   Procedure: CATARACT EXTRACTION PHACO  AND INTRAOCULAR LENS PLACEMENT ([place]) COMPLICATED DIABETIC LEFT;  Surgeon: Leandrew Koyanagi, MD;  Location: [facility];  Service: Ophthalmology;  Laterality: Left;  Diabetic - oral meds  . CHOLECYSTECTOMY    . COLONOSCOPY  2016  . PORTA CATH INSERTION N/A 11/13/2020   Procedure: PORTA CATH INSERTION;  Surgeon: Algernon Huxley, MD;  Location: [facility];  Service: Cardiovascular;  Laterality: N/A;    Family History  Problem Relation Age of Onset  . Cancer Mother   . Colon cancer Neg Hx   . Colon polyps Neg Hx   . Esophageal cancer Neg Hx   . Rectal cancer Neg Hx   . Stomach cancer Neg Hx     Social History:  reports that he quit smoking about 36 years ago. He has never used smokeless tobacco. He reports previous alcohol use. He reports that he does not use drugs. He quit smoking cold [place] in 1986. He smoked 2 packs per day for 20 years. He drinks a glass of wine or a Margarita occasionally. 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 He was exposed to [employer]. He worked with a Geologist, engineering for 34 years. The patient is accompanied by his wife today.  Allergies: No Known Allergies  Current Medications: Current Outpatient Medications  Medication Sig Dispense Refill  . ALPRAZolam (XANAX) 0.25 MG tablet Take 1 tablet (0.25 mg  total) by mouth at bedtime as needed for anxiety. 60 tablet 0  . brimonidine-timolol (COMBIGAN) 0.2-0.5 % ophthalmic solution Place 1 drop into the left eye daily.    . calcium carbonate (OSCAL) 1500 (600 Ca) MG TABS tablet Take by mouth 2 (two) times daily with a meal.    . citalopram (CELEXA) 20 MG tablet Take 1.5 tablets (30 mg total) by mouth daily with breakfast. 45 tablet 0  . dexamethasone (DECADRON) 4 MG tablet 2 TABS BY MOUTH DAILY. TAKE DAILY X 3 DAYS STARTING THE DAY AFTER CHEMOTHERAPY. TAKE WITH FOOD. 30 tablet 1  . dexamethasone (DECADRON) 4 MG tablet Take 1 tablet (4 mg total) by mouth 2 (two) times daily with a meal.  60 tablet 0  . esomeprazole (NEXIUM) 20 MG capsule Take 40 mg by mouth daily at 12 noon.    . fluticasone (FLONASE) 50 MCG/ACT nasal spray Place into both nostrils.    Marland Kitchen glipiZIDE (GLUCOTROL XL) 5 MG 24 hr tablet Take 5 mg by mouth 2 (two) times daily.  1  . glucose blood test strip     . lidocaine-prilocaine (EMLA) cream Apply to affected area once 30 g 3  . losartan (COZAAR) 100 MG tablet Take 100 mg by mouth daily.    . magic mouthwash w/lidocaine SOLN Take 5 mLs by mouth.    . metFORMIN (GLUCOPHAGE-XR) 500 MG 24 hr tablet Take 500 mg by mouth 2 (two) times daily. Takes 2 tablets and 1 tablet in the pm    . simvastatin (ZOCOR) 40 MG tablet Take 40 mg by mouth daily at 6 PM.    . sucralfate (CARAFATE) 1 g tablet Take 0.5 tablets (0.5 g total) by mouth 3 (three) times daily. Dissolve in warm water, swish and swallow 60 tablet 1  . traZODone (DESYREL) 50 MG tablet Take 0.5-1 tablets (25-50 mg total) by mouth at bedtime as needed for sleep. 30 tablet 2  . VITAMIN D PO Take 800 Units by mouth daily.    . ondansetron (ZOFRAN) 8 MG tablet Take 1 tablet (8 mg total) by mouth 2 (two) times daily as needed. Start on the third day after cisplatin chemotherapy. (Patient not taking: Reported on 01/16/2021) 30 tablet 1  . prochlorperazine (COMPAZINE) 10 MG tablet Take 1 tablet (10 mg total) by mouth every 6 (six) hours as needed (Nausea or vomiting). (Patient not taking: Reported on 01/16/2021) 30 tablet 1   Current Facility-Administered Medications  Medication Dose Route Frequency Provider Last Rate Last Admin  . 0.9 %  sodium chloride infusion  500 mL Intravenous Once Pyrtle, Lajuan Lines, MD       Facility-Administered Medications Ordered in Other Visits  Medication Dose Route Frequency Provider Last Rate Last Admin  . 0.9 % NaCl with KCl 20 mEq/ L  infusion   Intravenous Once Kashlynn Kundert C, MD      . CISplatin (PLATINOL) 87 mg in sodium chloride 0.9 % 250 mL chemo infusion  40 mg/m2 (Order-Specific)  Intravenous Once Nolon Stalls C, MD      . dexamethasone (DECADRON) 10 mg in sodium chloride 0.9 % 50 mL IVPB  10 mg Intravenous Once Karan Ramnauth C, MD      . fosaprepitant (EMEND) 150 mg in sodium chloride 0.9 % 145 mL IVPB  150 mg Intravenous Once Graciana Sessa C, MD      . magnesium sulfate IVPB 2 g 50 mL  2 g Intravenous Once Lequita Asal, MD  Review of Systems  Constitutional: Positive for weight loss (6 lbs). Negative for chills, diaphoresis, fever and malaise/fatigue.       Feels "good".  HENT: Positive for hearing loss and sore throat (improved). Negative for congestion, ear discharge, ear pain, nosebleeds, sinus pain and tinnitus.   Eyes: Negative for blurred vision.  Respiratory: Positive for shortness of breath (with exertion, improving). Negative for cough, hemoptysis and sputum production.   Cardiovascular: Negative for chest pain, palpitations and leg swelling.  Gastrointestinal: Negative for abdominal pain, blood in stool, constipation, diarrhea, heartburn, melena, nausea and vomiting.  Genitourinary: Negative for dysuria, frequency, hematuria and urgency.  Musculoskeletal: Negative for back pain, joint pain, myalgias and neck pain.  Skin: Negative for itching and rash.  Neurological: Negative for dizziness, tingling, sensory change, weakness and headaches.  Endo/Heme/Allergies: Does not bruise/bleed easily.  Psychiatric/Behavioral: Negative for depression and memory loss. The patient is not nervous/anxious (on citalopram and Xanax) and does not have insomnia.   All other systems reviewed and are negative.  Performance status (ECOG): 1  Vitals Blood pressure 113/71, pulse 69, temperature (!) 97.1 F (36.2 C), temperature source Tympanic, weight 190 lb 12.9 oz (86.5 kg).   Physical Exam Vitals and nursing note reviewed.  Constitutional:      General: He is not in acute distress.    Appearance: He is not diaphoretic.  HENT:     Head:  Normocephalic and atraumatic.     Comments: Gray hair.    Mouth/Throat:     Mouth: Mucous membranes are moist.     Pharynx: Oropharynx is clear.  Eyes:     General: No scleral icterus.    Extraocular Movements: Extraocular movements intact.     Conjunctiva/sclera: Conjunctivae normal.     Pupils: Pupils are equal, round, and reactive to light.     Comments: Blue eyes.  Neck:     Comments: Right neck mass 1 x 1 cm Cardiovascular:     Rate and Rhythm: Normal rate and regular rhythm.     Heart sounds: Normal heart sounds. No murmur heard.   Pulmonary:     Effort: Pulmonary effort is normal. No respiratory distress.     Breath sounds: Normal breath sounds. No wheezing or rales.  Chest:     Chest wall: No tenderness.  Breasts:     Right: No axillary adenopathy or supraclavicular adenopathy.     Left: No axillary adenopathy or supraclavicular adenopathy.    Abdominal:     General: Bowel sounds are normal. There is no distension.     Palpations: Abdomen is soft. There is no mass.     Tenderness: There is no abdominal tenderness. There is no guarding or rebound.  Musculoskeletal:        General: No swelling or tenderness. Normal range of motion.     Cervical back: Normal range of motion and neck supple.  Lymphadenopathy:     Head:     Right side of head: No preauricular, posterior auricular or occipital adenopathy.     Left side of head: No preauricular, posterior auricular or occipital adenopathy.     Upper Body:     Right upper body: No supraclavicular or axillary adenopathy.     Left upper body: No supraclavicular or axillary adenopathy.     Lower Body: No right inguinal adenopathy. No left inguinal adenopathy.  Skin:    General: Skin is warm and dry.     Comments: Minor neck radiation changes.  Neurological:  Mental Status: He is alert and oriented to person, place, and time.  Psychiatric:        Behavior: Behavior normal.        Thought Content: Thought content  normal.        Judgment: Judgment normal.    Infusion on 01/16/2021  Component Date Value Ref Range Status  . WBC 01/16/2021 2.4* 4.0 - 10.5 K/uL Final  . RBC 01/16/2021 3.50* 4.22 - 5.81 MIL/uL Final  . Hemoglobin 01/16/2021 10.6* 13.0 - 17.0 g/dL Final  . HCT 01/16/2021 32.3* 39.0 - 52.0 % Final  . MCV 01/16/2021 92.3  80.0 - 100.0 fL Final  . MCH 01/16/2021 30.3  26.0 - 34.0 pg Final  . MCHC 01/16/2021 32.8  30.0 - 36.0 g/dL Final  . RDW 01/16/2021 19.6* 11.5 - 15.5 % Final  . Platelets 01/16/2021 102* 150 - 400 K/uL Final   Comment: Immature Platelet Fraction may be clinically indicated, consider ordering this additional test [id number]   . nRBC 01/16/2021 0.0  0.0 - 0.2 % Final  . Neutrophils Relative % 01/16/2021 75  % Final  . Neutro Abs 01/16/2021 1.8  1.7 - 7.7 K/uL Final  . Lymphocytes Relative 01/16/2021 15  % Final  . Lymphs Abs 01/16/2021 0.4* 0.7 - 4.0 K/uL Final  . Monocytes Relative 01/16/2021 8  % Final  . Monocytes Absolute 01/16/2021 0.2  0.1 - 1.0 K/uL Final  . Eosinophils Relative 01/16/2021 0  % Final  . Eosinophils Absolute 01/16/2021 0.0  0.0 - 0.5 K/uL Final  . Basophils Relative 01/16/2021 0  % Final  . Basophils Absolute 01/16/2021 0.0  0.0 - 0.1 K/uL Final  . Immature Granulocytes 01/16/2021 2  % Final  . Abs Immature Granulocytes 01/16/2021 0.05  0.00 - 0.07 K/uL Final   Performed at [facility], [address] [place], [place] [address]    Assessment:  Sean Niu. is a 75 y.o. 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  He presented with right neck adenopathy.    Neck soft tissue CT on 09/28/2020 revealed a 3.5 x 3.0 x 4.8 cm right neck mass most c/w enlarged lymph node. There was a 14 mm enhancing mass in the right base of tongue.    PET scan on 10/25/2020 revealed right tongue base primary with ipsilateral level 2 nodal metastasis. There were no findings of extracervical metastatic disease. There was focal hypermetabolism in the distal esophagus, with possible concurrent soft tissue density lesion. Recommended correlation with endoscopy. There was vague right lower lobe low-level hypermetabolism and ground-glass nodularity, favored minimal infection or aspiration. Incidental findings included aortic atherosclerosis, coronary artery atherosclerosis, emphysema, a tiny hiatal hernia, and prostatomegaly.  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  Speech discrimination scores were 100% in each ear.  He received 6 weeks of cisplatin with concurrent radiation (11/20/2020 - 12/27/2020).  He completed radiation on 01/11/2021.  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  Upper endoscopy on 11/03/2020 revealed a malignant esophageal tumor at the gastroesophageal junction. There were esophageal mucosal changes c/w short-segment Barrett's esophagus. There was a 2 cm hiatal hernia. There was gastritis.  Pathology in the esophagus at 36 cm revealed intramucosal adenocarcinoma arising in a background of high grade dysplasia  and intestinal metaplasia; pathology at 38 cm revealed adenocarcinoma.  EUS on 11/15/2020 revealed early stage adenocarcinoma arising from Barrett's esophagus.  Lesion was T1bN0 and non-obstructing.  He has B12 deficiency.  B12 was 180 on 01/08/2021.  He is on oral B12.  He has PTSD and clastrophobia.  Anxiety is relieved by alprazolam 0.25 mg prior to radiation.  He is on citalopram 20 mg a day and trazodone at night.  Symptomatically, he is doing well.  He is recovering from radiatio.  He denies any nausea or vomiting.  He is gaining weight.  Exam reveals a 1 cm right neck node.  WBC is 2400.  Plan: 1.   Labs today: CBC with diff, BMP, Mg. 2. Clinical  stage I right base of tongue carcinoma Clinical stage I. 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 PET scan on 10/25/2020 revealed a right base of tongue primary with ipsilateral level 2 nodal metastasis. No evidence of distant metastasis. Plan: cisplatin 40 mg/m2 weekly x 7 with radiation.             He completed concurrent radiation and cisplatin on 01/11/2021.  Clinically, he is doing well.  Exam reveals resolving radiation changes to his neck.   Neck adenopathy continues to decrease in size.  Labs reviewed.  WBC remains low.  Discuss completion of chemotherapy.   He has received >= 6 cycles of cisplatin (> 200 mg/m2).  Discuss symptom management.  He has antiemetics at home to use on a prn bases.  Interventions are adequate.    He denies any nausea or vomiting.  Discuss plan for PET scan 3 months after completion of radiation (around 04/10/2021).   3. 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 EUS on 11/15/2020 revealed a T1bN0 lesion.  He may be a candidate for endoscopic resection followed by ablation or esophagectomy.   Anticipate follow-up CT scan and upper endoscopy with ultrasound for restaging.  He has an appointment with Dr. Keturah BarreAbigail Butts at [facility] around 01/18/2021.   [facility]ure [facility] complete for referral. 4.   Mucositis  Radiation-induced mucositis is resolving.  Oral mucosa unremarkable.  He notes less oral pain with the use of Magic mouthwash. 5. Anxiety  Anxiety remains well controlled.  Continue to monitor. 6.   Hyponatremia, resolved             Sodium 135.             Patient drink fluids and slowly advancing his diet as tolerated. 7.   Hypomagnesemia  Magnesium  1.6 today.  Begin magnesium oxide 1 tablet po q day. 8.   B12 deficiency  B12 was 180 on 01/08/2021.  Patient on oral B12.  Check B12 level in 1 month.  B12 goal 400.  If B12 remains low, begin B12 injections. 9.   No chemotherapy today secondary to WBC. 10.   RN:  Contact Francee Nodal, nurse navigator at the [place], [phone] re: [facility] form for surgery referral. 11.   RTC on 02/05/2021 for labs (CBC with diff, BMP, B12). 12.   RTC in 1 month for MD assessment and +/- labs.  I discussed the assessment and treatment plan with the patient.  The patient was provided an opportunity to ask questions and all were answered.  The patient agreed with the plan and demonstrated an understanding of the instructions.  The  patient was advised to call back if the symptoms worsen or if the condition fails to improve as anticipated.   Sean Garner C. Mike Gip, MD, PhD    01/16/2021, 8:47 AM   I, Mirian Mo Tufford, am acting as Education administrator for [employer]. Mike Gip, MD, PhD.  I, Rheda Kassab C. Mike Gip, MD, have reviewed the above documentation for accuracy and completeness, and I agree with the above.

## 2021-01-15 NOTE — Telephone Encounter (Signed)
Nutrition Follow-up:   Patient with right base of the tongue cancer stage 1b and adenocarcinoma of GE junction.  Patient has completed radiation 2/24 with last chemotherapy planned for 3/1.  Spoke with patient via phone this am.  Patient reports that he is eating more. Has sore throat and no taste but still trying to eat.  Yesterday ate hamburger steak and potato for lunch. Dinner was baked chicken with potatoes and banana pudding.  Drank 2 glucerna yesterday and 2 ensure complete shakes as well as water.  Eating fruit cocktail.  Received fluids last week    Medications: reviewed  Labs: reviewed  Anthropometrics:   Weight 196 lb on 2/23 stable from 196 lb on 2/21 191 lb on 2/16   NUTRITION DIAGNOSIS: Inadequate oral intake improved   INTERVENTION:  Encouraged patient to continue high calorie, high protein shakes and foods to maintain weight Patient has contact information    MONITORING, EVALUATION, GOAL: weight trends, intake   NEXT VISIT: phone call in ~ 3 weeks  Daya Dutt B. Zenia Resides, [place], [place] Registered Dietitian [phone] (mobile)

## 2021-01-16 ENCOUNTER — Inpatient Hospital Stay (HOSPITAL_BASED_OUTPATIENT_CLINIC_OR_DEPARTMENT_OTHER): Payer: Medicare Other | Admitting: Hematology and Oncology

## 2021-01-16 ENCOUNTER — Encounter: Payer: Self-pay | Admitting: Hematology and Oncology

## 2021-01-16 ENCOUNTER — Telehealth: Payer: Self-pay

## 2021-01-16 ENCOUNTER — Ambulatory Visit (HOSPITAL_COMMUNITY): Payer: Medicare Other | Admitting: Licensed Clinical Social Worker

## 2021-01-16 ENCOUNTER — Other Ambulatory Visit: Payer: Self-pay

## 2021-01-16 ENCOUNTER — Inpatient Hospital Stay: Payer: Medicare Other

## 2021-01-16 ENCOUNTER — Inpatient Hospital Stay: Payer: Medicare Other | Attending: Hematology and Oncology

## 2021-01-16 VITALS — BP 113/71 | HR 69 | Temp 97.1°F | Wt 190.8 lb

## 2021-01-16 DIAGNOSIS — I2699 Other pulmonary embolism without acute cor pulmonale: Secondary | ICD-10-CM | POA: Diagnosis not present

## 2021-01-16 DIAGNOSIS — E538 Deficiency of other specified B group vitamins: Secondary | ICD-10-CM | POA: Diagnosis not present

## 2021-01-16 DIAGNOSIS — C159 Malignant neoplasm of esophagus, unspecified: Secondary | ICD-10-CM | POA: Diagnosis not present

## 2021-01-16 DIAGNOSIS — K1230 Oral mucositis (ulcerative), unspecified: Secondary | ICD-10-CM | POA: Insufficient documentation

## 2021-01-16 DIAGNOSIS — Z87891 Personal history of nicotine dependence: Secondary | ICD-10-CM | POA: Diagnosis not present

## 2021-01-16 DIAGNOSIS — C01 Malignant neoplasm of base of tongue: Secondary | ICD-10-CM | POA: Insufficient documentation

## 2021-01-16 DIAGNOSIS — Z7901 Long term (current) use of anticoagulants: Secondary | ICD-10-CM | POA: Insufficient documentation

## 2021-01-16 DIAGNOSIS — Z7189 Other specified counseling: Secondary | ICD-10-CM

## 2021-01-16 DIAGNOSIS — C775 Secondary and unspecified malignant neoplasm of intrapelvic lymph nodes: Secondary | ICD-10-CM | POA: Insufficient documentation

## 2021-01-16 DIAGNOSIS — M21372 Foot drop, left foot: Secondary | ICD-10-CM | POA: Diagnosis not present

## 2021-01-16 DIAGNOSIS — C160 Malignant neoplasm of cardia: Secondary | ICD-10-CM | POA: Insufficient documentation

## 2021-01-16 DIAGNOSIS — Z5111 Encounter for antineoplastic chemotherapy: Secondary | ICD-10-CM

## 2021-01-16 DIAGNOSIS — F419 Anxiety disorder, unspecified: Secondary | ICD-10-CM | POA: Insufficient documentation

## 2021-01-16 DIAGNOSIS — I82412 Acute embolism and thrombosis of left femoral vein: Secondary | ICD-10-CM | POA: Diagnosis not present

## 2021-01-16 DIAGNOSIS — I2602 Saddle embolus of pulmonary artery with acute cor pulmonale: Secondary | ICD-10-CM | POA: Diagnosis not present

## 2021-01-16 DIAGNOSIS — E871 Hypo-osmolality and hyponatremia: Secondary | ICD-10-CM

## 2021-01-16 LAB — BASIC METABOLIC PANEL
Anion gap: 11 (ref 5–15)
BUN: 29 mg/dL — ABNORMAL HIGH (ref 8–23)
CO2: 28 mmol/L (ref 22–32)
Calcium: 9.3 mg/dL (ref 8.9–10.3)
Chloride: 96 mmol/L — ABNORMAL LOW (ref 98–111)
Creatinine, Ser: 0.9 mg/dL (ref 0.61–1.24)
GFR, Estimated: >60 mL/min (ref >60)
Glucose, Bld: 157 mg/dL — ABNORMAL HIGH (ref 70–99)
Potassium: 3.4 mmol/L — ABNORMAL LOW (ref 3.5–5.1)
Sodium: 135 mmol/L (ref 135–145)

## 2021-01-16 LAB — CBC WITH DIFFERENTIAL/PLATELET
Abs Immature Granulocytes: 0.05 10*3/uL (ref 0.00–0.07)
Basophils Absolute: 0 10*3/uL (ref 0.0–0.1)
Basophils Relative: 0 %
Eosinophils Absolute: 0 10*3/uL (ref 0.0–0.5)
Eosinophils Relative: 0 %
HCT: 32.3 % — ABNORMAL LOW (ref 39.0–52.0)
Hemoglobin: 10.6 g/dL — ABNORMAL LOW (ref 13.0–17.0)
Immature Granulocytes: 2 %
Lymphocytes Relative: 15 %
Lymphs Abs: 0.4 10*3/uL — ABNORMAL LOW (ref 0.7–4.0)
MCH: 30.3 pg (ref 26.0–34.0)
MCHC: 32.8 g/dL (ref 30.0–36.0)
MCV: 92.3 fL (ref 80.0–100.0)
Monocytes Absolute: 0.2 10*3/uL (ref 0.1–1.0)
Monocytes Relative: 8 %
Neutro Abs: 1.8 10*3/uL (ref 1.7–7.7)
Neutrophils Relative %: 75 %
Platelets: 102 10*3/uL — ABNORMAL LOW (ref 150–400)
RBC: 3.5 MIL/uL — ABNORMAL LOW (ref 4.22–5.81)
RDW: 19.6 % — ABNORMAL HIGH (ref 11.5–15.5)
WBC: 2.4 10*3/uL — ABNORMAL LOW (ref 4.0–10.5)
nRBC: 0 % (ref 0.0–0.2)

## 2021-01-16 LAB — MAGNESIUM: Magnesium: 1.6 mg/dL — ABNORMAL LOW (ref 1.7–2.4)

## 2021-01-16 MED ORDER — HEPARIN SOD (PORK) LOCK FLUSH 100 UNIT/ML IV SOLN
500.0000 [IU] | Freq: Once | INTRAVENOUS | Status: AC
  Administered 2021-01-16: 500 [IU] via INTRAVENOUS
  Filled 2021-01-16: qty 5

## 2021-01-16 MED ORDER — HEPARIN SOD (PORK) LOCK FLUSH 100 UNIT/ML IV SOLN
INTRAVENOUS | Status: AC
  Filled 2021-01-16: qty 5

## 2021-01-16 NOTE — Telephone Encounter (Signed)
Care has been coordinated with the [place], with the assist of navigator, Francee Nodal. Appointment is with Dr. Elenor Quinones on 01/18/21 at 1100. Mr. Jarrells is aware of this and does not have further needs at this time.

## 2021-01-16 NOTE — Telephone Encounter (Signed)
  Please call patient.    We usually do B12 pills for a month before going to the injections.  Whatever we do (shots or pills) will be for life.  If he wants injections, we can set that up.  M

## 2021-01-16 NOTE — Telephone Encounter (Signed)
-----   Message from Lequita Asal, MD sent at 01/16/2021 11:39 AM EST ----- Regarding: Please call patient  Magnesium is a little low.  Does he want to try oral magnesium oxide?  M  ----- Message ----- From: Buel Ream, [facility] Sent: 01/16/2021   8:45 AM EST To: Lequita Asal, MD

## 2021-01-16 NOTE — Telephone Encounter (Signed)
  Over the counter magnesium oxide would be fine.  Watch for diarrhea (can be a side effect).  M

## 2021-01-17 ENCOUNTER — Telehealth: Payer: Medicare Other | Admitting: Hospice and Palliative Medicine

## 2021-01-18 ENCOUNTER — Other Ambulatory Visit: Payer: Self-pay | Admitting: Hematology and Oncology

## 2021-01-18 ENCOUNTER — Telehealth: Payer: Self-pay | Admitting: Hematology and Oncology

## 2021-01-18 DIAGNOSIS — C01 Malignant neoplasm of base of tongue: Secondary | ICD-10-CM

## 2021-01-18 DIAGNOSIS — C159 Malignant neoplasm of esophagus, unspecified: Secondary | ICD-10-CM

## 2021-01-18 DIAGNOSIS — Z7189 Other specified counseling: Secondary | ICD-10-CM | POA: Insufficient documentation

## 2021-01-18 NOTE — Telephone Encounter (Signed)
Left VM with patient to notify him of STAT CT  scheduled. Informed pt of time and reviewed instructions. Requested a call back to confirm.

## 2021-01-19 ENCOUNTER — Other Ambulatory Visit: Payer: Self-pay

## 2021-01-19 ENCOUNTER — Ambulatory Visit
Admission: RE | Admit: 2021-01-19 | Discharge: 2021-01-19 | Disposition: A | Discharge status: Home / Self Care | Payer: No Typology Code available for payment source | Source: Ambulatory Visit | Attending: Hematology and Oncology | Admitting: Hematology and Oncology

## 2021-01-19 ENCOUNTER — Encounter: Payer: Self-pay | Admitting: Emergency Medicine

## 2021-01-19 ENCOUNTER — Telehealth (INDEPENDENT_AMBULATORY_CARE_PROVIDER_SITE_OTHER): Payer: Medicare Other | Admitting: Psychiatry

## 2021-01-19 ENCOUNTER — Inpatient Hospital Stay
Admission: EM | Admit: 2021-01-19 | Discharge: 2021-01-22 | DRG: 175 | Disposition: A | Discharge status: Home Health | Payer: No Typology Code available for payment source | Attending: Internal Medicine | Admitting: Internal Medicine

## 2021-01-19 DIAGNOSIS — F40240 Claustrophobia: Secondary | ICD-10-CM | POA: Diagnosis present

## 2021-01-19 DIAGNOSIS — Z809 Family history of malignant neoplasm, unspecified: Secondary | ICD-10-CM

## 2021-01-19 DIAGNOSIS — Z87891 Personal history of nicotine dependence: Secondary | ICD-10-CM | POA: Diagnosis not present

## 2021-01-19 DIAGNOSIS — F4310 Post-traumatic stress disorder, unspecified: Secondary | ICD-10-CM | POA: Diagnosis present

## 2021-01-19 DIAGNOSIS — C01 Malignant neoplasm of base of tongue: Secondary | ICD-10-CM | POA: Insufficient documentation

## 2021-01-19 DIAGNOSIS — E871 Hypo-osmolality and hyponatremia: Secondary | ICD-10-CM | POA: Diagnosis present

## 2021-01-19 DIAGNOSIS — K219 Gastro-esophageal reflux disease without esophagitis: Secondary | ICD-10-CM | POA: Diagnosis present

## 2021-01-19 DIAGNOSIS — R0602 Shortness of breath: Secondary | ICD-10-CM | POA: Diagnosis not present

## 2021-01-19 DIAGNOSIS — C155 Malignant neoplasm of lower third of esophagus: Secondary | ICD-10-CM | POA: Diagnosis present

## 2021-01-19 DIAGNOSIS — I248 Other forms of acute ischemic heart disease: Secondary | ICD-10-CM | POA: Diagnosis present

## 2021-01-19 DIAGNOSIS — Z85810 Personal history of malignant neoplasm of tongue: Secondary | ICD-10-CM | POA: Diagnosis not present

## 2021-01-19 DIAGNOSIS — H409 Unspecified glaucoma: Secondary | ICD-10-CM | POA: Diagnosis present

## 2021-01-19 DIAGNOSIS — I10 Essential (primary) hypertension: Secondary | ICD-10-CM | POA: Diagnosis present

## 2021-01-19 DIAGNOSIS — Z95828 Presence of other vascular implants and grafts: Secondary | ICD-10-CM | POA: Diagnosis not present

## 2021-01-19 DIAGNOSIS — J309 Allergic rhinitis, unspecified: Secondary | ICD-10-CM | POA: Diagnosis present

## 2021-01-19 DIAGNOSIS — I82411 Acute embolism and thrombosis of right femoral vein: Secondary | ICD-10-CM | POA: Diagnosis present

## 2021-01-19 DIAGNOSIS — R001 Bradycardia, unspecified: Secondary | ICD-10-CM | POA: Diagnosis present

## 2021-01-19 DIAGNOSIS — I2602 Saddle embolus of pulmonary artery with acute cor pulmonale: Secondary | ICD-10-CM | POA: Diagnosis present

## 2021-01-19 DIAGNOSIS — I2699 Other pulmonary embolism without acute cor pulmonale: Secondary | ICD-10-CM | POA: Diagnosis present

## 2021-01-19 DIAGNOSIS — I82409 Acute embolism and thrombosis of unspecified deep veins of unspecified lower extremity: Secondary | ICD-10-CM | POA: Diagnosis not present

## 2021-01-19 DIAGNOSIS — E538 Deficiency of other specified B group vitamins: Secondary | ICD-10-CM | POA: Diagnosis present

## 2021-01-19 DIAGNOSIS — R778 Other specified abnormalities of plasma proteins: Secondary | ICD-10-CM | POA: Diagnosis not present

## 2021-01-19 DIAGNOSIS — C159 Malignant neoplasm of esophagus, unspecified: Secondary | ICD-10-CM | POA: Insufficient documentation

## 2021-01-19 DIAGNOSIS — Z20822 Contact with and (suspected) exposure to covid-19: Secondary | ICD-10-CM | POA: Diagnosis present

## 2021-01-19 DIAGNOSIS — F419 Anxiety disorder, unspecified: Secondary | ICD-10-CM | POA: Diagnosis present

## 2021-01-19 DIAGNOSIS — F411 Generalized anxiety disorder: Secondary | ICD-10-CM | POA: Diagnosis present

## 2021-01-19 DIAGNOSIS — Z79899 Other long term (current) drug therapy: Secondary | ICD-10-CM | POA: Diagnosis not present

## 2021-01-19 DIAGNOSIS — D61818 Other pancytopenia: Secondary | ICD-10-CM | POA: Diagnosis not present

## 2021-01-19 DIAGNOSIS — E119 Type 2 diabetes mellitus without complications: Secondary | ICD-10-CM | POA: Diagnosis not present

## 2021-01-19 DIAGNOSIS — Z7984 Long term (current) use of oral hypoglycemic drugs: Secondary | ICD-10-CM | POA: Diagnosis not present

## 2021-01-19 DIAGNOSIS — R0603 Acute respiratory distress: Secondary | ICD-10-CM | POA: Diagnosis present

## 2021-01-19 DIAGNOSIS — D61810 Antineoplastic chemotherapy induced pancytopenia: Secondary | ICD-10-CM | POA: Diagnosis present

## 2021-01-19 DIAGNOSIS — E861 Hypovolemia: Secondary | ICD-10-CM | POA: Diagnosis present

## 2021-01-19 DIAGNOSIS — E782 Mixed hyperlipidemia: Secondary | ICD-10-CM | POA: Diagnosis present

## 2021-01-19 DIAGNOSIS — T451X5A Adverse effect of antineoplastic and immunosuppressive drugs, initial encounter: Secondary | ICD-10-CM | POA: Diagnosis present

## 2021-01-19 DIAGNOSIS — R7989 Other specified abnormal findings of blood chemistry: Secondary | ICD-10-CM | POA: Diagnosis present

## 2021-01-19 DIAGNOSIS — R0902 Hypoxemia: Secondary | ICD-10-CM | POA: Diagnosis present

## 2021-01-19 LAB — CBC WITH DIFFERENTIAL/PLATELET
Abs Immature Granulocytes: 0.15 10*3/uL — ABNORMAL HIGH (ref 0.00–0.07)
Basophils Absolute: 0 10*3/uL (ref 0.0–0.1)
Basophils Relative: 0 %
Eosinophils Absolute: 0 10*3/uL (ref 0.0–0.5)
Eosinophils Relative: 0 %
HCT: 32.2 % — ABNORMAL LOW (ref 39.0–52.0)
Hemoglobin: 11.2 g/dL — ABNORMAL LOW (ref 13.0–17.0)
Immature Granulocytes: 5 %
Lymphocytes Relative: 11 %
Lymphs Abs: 0.4 10*3/uL — ABNORMAL LOW (ref 0.7–4.0)
MCH: 32.1 pg (ref 26.0–34.0)
MCHC: 34.8 g/dL (ref 30.0–36.0)
MCV: 92.3 fL (ref 80.0–100.0)
Monocytes Absolute: 0.3 10*3/uL (ref 0.1–1.0)
Monocytes Relative: 9 %
Neutro Abs: 2.4 10*3/uL (ref 1.7–7.7)
Neutrophils Relative %: 75 %
Platelets: 126 10*3/uL — ABNORMAL LOW (ref 150–400)
RBC: 3.49 MIL/uL — ABNORMAL LOW (ref 4.22–5.81)
RDW: 20 % — ABNORMAL HIGH (ref 11.5–15.5)
WBC: 3.3 10*3/uL — ABNORMAL LOW (ref 4.0–10.5)
nRBC: 0 % (ref 0.0–0.2)

## 2021-01-19 LAB — APTT: aPTT: 26 seconds (ref 24–36)

## 2021-01-19 LAB — COMPREHENSIVE METABOLIC PANEL
ALT: 25 U/L (ref 0–44)
AST: 18 U/L (ref 15–41)
Albumin: 4.1 g/dL (ref 3.5–5.0)
Alkaline Phosphatase: 46 U/L (ref 38–126)
Anion gap: 7 (ref 5–15)
BUN: 28 mg/dL — ABNORMAL HIGH (ref 8–23)
CO2: 28 mmol/L (ref 22–32)
Calcium: 9 mg/dL (ref 8.9–10.3)
Chloride: 96 mmol/L — ABNORMAL LOW (ref 98–111)
Creatinine, Ser: 0.81 mg/dL (ref 0.61–1.24)
GFR, Estimated: >60 mL/min (ref >60)
Glucose, Bld: 178 mg/dL — ABNORMAL HIGH (ref 70–99)
Potassium: 3.6 mmol/L (ref 3.5–5.1)
Sodium: 131 mmol/L — ABNORMAL LOW (ref 135–145)
Total Bilirubin: 1.1 mg/dL (ref 0.3–1.2)
Total Protein: 6.4 g/dL — ABNORMAL LOW (ref 6.5–8.1)

## 2021-01-19 LAB — CBG MONITORING, ED
Glucose-Capillary: 209 mg/dL — ABNORMAL HIGH (ref 70–99)
Glucose-Capillary: 196 mg/dL — ABNORMAL HIGH (ref 70–99)

## 2021-01-19 LAB — PROTIME-INR
INR: 1.1 (ref 0.8–1.2)
Prothrombin Time: 14.1 seconds (ref 11.4–15.2)

## 2021-01-19 LAB — RESP PANEL BY RT-PCR (FLU A&B, COVID) ARPGX2
Influenza A by PCR: NEGATIVE
Influenza B by PCR: NEGATIVE
SARS Coronavirus 2 by RT PCR: NEGATIVE

## 2021-01-19 LAB — MAGNESIUM: Magnesium: 2 mg/dL (ref 1.7–2.4)

## 2021-01-19 LAB — LACTIC ACID, PLASMA: Lactic Acid, Venous: 1.2 mmol/L (ref 0.5–1.9)

## 2021-01-19 LAB — OSMOLALITY: Osmolality: 291 mOsm/kg (ref 275–295)

## 2021-01-19 LAB — SODIUM, URINE, RANDOM: Sodium, Ur: 99 mmol/L

## 2021-01-19 LAB — TROPONIN I (HIGH SENSITIVITY)
Troponin I (High Sensitivity): 23 ng/L — ABNORMAL HIGH (ref <18)
Troponin I (High Sensitivity): 21 ng/L — ABNORMAL HIGH (ref <18)

## 2021-01-19 LAB — OSMOLALITY, URINE: Osmolality, Ur: 572 mOsm/kg (ref 300–900)

## 2021-01-19 LAB — BRAIN NATRIURETIC PEPTIDE: B Natriuretic Peptide: 221.1 pg/mL — ABNORMAL HIGH (ref 0.0–100.0)

## 2021-01-19 MED ORDER — CITALOPRAM HYDROBROMIDE 20 MG PO TABS
20.0000 mg | ORAL_TABLET | Freq: Every day | ORAL | Status: DC
  Administered 2021-01-20 – 2021-01-22 (×3): 20 mg via ORAL
  Filled 2021-01-19 (×3): qty 1

## 2021-01-19 MED ORDER — ALBUTEROL SULFATE HFA 108 (90 BASE) MCG/ACT IN AERS
1.0000 | INHALATION_SPRAY | RESPIRATORY_TRACT | Status: DC | PRN
  Filled 2021-01-19: qty 6.7

## 2021-01-19 MED ORDER — ACETAMINOPHEN 325 MG PO TABS: 650.0000 mg | ORAL_TABLET | Freq: Four times a day (QID) | ORAL | Status: DC | PRN

## 2021-01-19 MED ORDER — HEPARIN (PORCINE) 25000 UT/250ML-% IV SOLN
1500.0000 [IU]/h | INTRAVENOUS | Status: DC
  Administered 2021-01-19: 1500 [IU]/h via INTRAVENOUS
  Filled 2021-01-19: qty 250

## 2021-01-19 MED ORDER — IOHEXOL 300 MG/ML  SOLN
100.0000 mL | Freq: Once | INTRAMUSCULAR | Status: AC | PRN
  Administered 2021-01-19: 100 mL via INTRAVENOUS

## 2021-01-19 MED ORDER — TIMOLOL MALEATE 0.5 % OP SOLN
1.0000 [drp] | Freq: Every day | OPHTHALMIC | Status: DC
  Administered 2021-01-20 – 2021-01-22 (×3): 1 [drp] via OPHTHALMIC
  Filled 2021-01-19: qty 5

## 2021-01-19 MED ORDER — INSULIN ASPART 100 UNIT/ML ~~LOC~~ SOLN
0.0000 [IU] | Freq: Three times a day (TID) | SUBCUTANEOUS | Status: DC
  Administered 2021-01-19: 3 [IU] via SUBCUTANEOUS
  Administered 2021-01-20 – 2021-01-21 (×2): 2 [IU] via SUBCUTANEOUS
  Administered 2021-01-21: 3 [IU] via SUBCUTANEOUS
  Administered 2021-01-22: 5 [IU] via SUBCUTANEOUS
  Administered 2021-01-22: 3 [IU] via SUBCUTANEOUS
  Filled 2021-01-19 (×7): qty 1

## 2021-01-19 MED ORDER — BRIMONIDINE TARTRATE-TIMOLOL 0.2-0.5 % OP SOLN
1.0000 [drp] | Freq: Every day | OPHTHALMIC | Status: DC
  Filled 2021-01-19: qty 5

## 2021-01-19 MED ORDER — PANTOPRAZOLE SODIUM 40 MG PO TBEC
40.0000 mg | DELAYED_RELEASE_TABLET | Freq: Every day | ORAL | Status: DC
  Administered 2021-01-20 – 2021-01-22 (×3): 40 mg via ORAL
  Filled 2021-01-19 (×3): qty 1

## 2021-01-19 MED ORDER — ALPRAZOLAM 0.25 MG PO TABS: 0.2500 mg | ORAL_TABLET | Freq: Every evening | ORAL | Status: DC | PRN

## 2021-01-19 MED ORDER — HEPARIN BOLUS VIA INFUSION
6000.0000 [IU] | Freq: Once | INTRAVENOUS | Status: AC
  Administered 2021-01-19: 6000 [IU] via INTRAVENOUS
  Filled 2021-01-19: qty 6000

## 2021-01-19 MED ORDER — BRIMONIDINE TARTRATE 0.2 % OP SOLN
1.0000 [drp] | Freq: Every day | OPHTHALMIC | Status: DC
  Administered 2021-01-20 – 2021-01-22 (×3): 1 [drp] via OPHTHALMIC
  Filled 2021-01-19: qty 5

## 2021-01-19 MED ORDER — LACTATED RINGERS IV SOLN: INTRAVENOUS | Status: DC

## 2021-01-19 MED ORDER — ACETAMINOPHEN 650 MG RE SUPP: 650.0000 mg | Freq: Four times a day (QID) | RECTAL | Status: DC | PRN

## 2021-01-19 MED ORDER — FLUTICASONE PROPIONATE 50 MCG/ACT NA SUSP
2.0000 | Freq: Every day | NASAL | Status: DC
  Administered 2021-01-20 – 2021-01-22 (×3): 2 via NASAL
  Filled 2021-01-19: qty 16

## 2021-01-19 MED ORDER — TRAZODONE HCL 50 MG PO TABS
50.0000 mg | ORAL_TABLET | Freq: Every evening | ORAL | Status: DC | PRN
  Administered 2021-01-19 – 2021-01-21 (×3): 50 mg via ORAL
  Filled 2021-01-19 (×3): qty 1

## 2021-01-19 MED ORDER — SIMVASTATIN 20 MG PO TABS
40.0000 mg | ORAL_TABLET | Freq: Every day | ORAL | Status: DC
  Administered 2021-01-19 – 2021-01-21 (×3): 40 mg via ORAL
  Filled 2021-01-19: qty 4
  Filled 2021-01-19 (×2): qty 2

## 2021-01-19 NOTE — ED Provider Notes (Signed)
[facility] Emergency Department Provider Note  ____________________________________________   Event Date/Time   First MD Initiated Contact with Patient 01/19/21 1434     (approximate)  I have reviewed the triage vital signs and the nursing notes.   HISTORY  Chief Complaint Shortness of breath  HPI Sean Garner. is a 75 y.o. male with history of squamous cell cancer of the throat who comes in for shortness of breath.  Patient reports having 1 week of shortness of breath worse with exertion, better at rest.  Denies having this previously.  Patient just had radiation on his throat 1 week ago.  Chemotherapy was last 2 weeks ago.  Patient was being followed up with oncology and had CT scan showing extensive nearly occlusive PE with right heart strain.  Patient was sent over here for further work-up and evaluation.  Patient denies any history of bleeding issues or intracranial hemorrhage or headaches or masses in his brain.            Past Medical History:  Diagnosis Date  . Anxiety   . Cancer [facility])    Head/throat Cancer at the base of the tongue  . Cataract    lt eye repair; present in rt eye but not ripe yet.  . Claustrophobia   . Diabetes mellitus without complication ([place])   . GERD (gastroesophageal reflux disease)   . Glaucoma    using gtts for this.  . Hypercholesterolemia   . Hypertension     Patient Active Problem List   Diagnosis Date Noted  . Goals of care, counseling/discussion 01/18/2021  . B12 deficiency 01/10/2021  . Bradycardia 01/10/2021  . Dehydration 01/08/2021  . Glaucoma 01/01/2021  . Exposure to Agent [facility] 01/01/2021  . Diabetes mellitus ([place]) 01/01/2021  . High risk medication use 01/01/2021  . GAD (generalized anxiety disorder) 01/01/2021  . 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 Hypocalcemia 12/15/2020  . Hypomagnesemia 12/13/2020  . Hyponatremia 11/28/2020  . Anxiety 11/27/2020  . Encounter for  antineoplastic chemotherapy 11/19/2020  . Esophageal cancer, stage IB ([place]) 11/16/2020  . Chronic post-traumatic stress disorder (PTSD) 10/24/2020  . Carcinoma of base of tongue ([place]) 10/17/2020  . Benign essential hypertension 08/08/2014  . GERD (gastroesophageal reflux disease) 08/08/2014  . Other and unspecified hyperlipidemia 08/08/2014  . Type 2 diabetes mellitus without complications ([place]) 57/84/6962  . Knee pain 09/09/2013  . Osteoarthritis of right knee 09/09/2013    Past Surgical History:  Procedure Laterality Date  . CATARACT EXTRACTION W/PHACO Left 12/03/2017   Procedure: CATARACT EXTRACTION PHACO AND INTRAOCULAR LENS PLACEMENT ([place]) COMPLICATED DIABETIC LEFT;  Surgeon: Leandrew Koyanagi, MD;  Location: [place];  Service: Ophthalmology;  Laterality: Left;  Diabetic - oral meds  . CHOLECYSTECTOMY    . COLONOSCOPY  2016  . PORTA CATH INSERTION N/A 11/13/2020   Procedure: PORTA CATH INSERTION;  Surgeon: Algernon Huxley, MD;  Location: [facility];  Service: Cardiovascular;  Laterality: N/A;    Prior to Admission medications   Medication Sig Start Date End Date Taking? Authorizing Provider  ALPRAZolam (XANAX) 0.25 MG tablet Take 1 tablet (0.25 mg total) by mouth at bedtime as needed for anxiety. 01/12/21   Borders, Kirt Boys, NP  brimonidine-timolol (COMBIGAN) 0.2-0.5 % ophthalmic solution Place 1 drop into the left eye daily. 06/09/19   [provider]  calcium carbonate (OSCAL) 1500 (600 Ca) MG TABS tablet Take by mouth 2 (two) times daily with a meal.  [provider]  citalopram (CELEXA) 20 MG tablet Take 1.5 tablets (30 mg total) by mouth daily with breakfast. 01/01/21   Eappen, Ria Clock, MD  dexamethasone (DECADRON) 4 MG tablet 2 TABS BY MOUTH DAILY. TAKE DAILY X 3 DAYS STARTING THE DAY AFTER CHEMOTHERAPY. TAKE WITH FOOD. 01/01/21   Lequita Asal, MD  dexamethasone (DECADRON) 4 MG tablet Take 1 tablet (4 mg total) by mouth 2 (two)  times daily with a meal. 01/01/21   Chrystal, Eulas Post, MD  esomeprazole (NEXIUM) 20 MG capsule Take 40 mg by mouth daily at 12 noon.    [provider]  fluticasone (FLONASE) 50 MCG/ACT nasal spray Place into both nostrils. 12/11/20   [provider]  glipiZIDE (GLUCOTROL XL) 5 MG 24 hr tablet Take 5 mg by mouth 2 (two) times daily. 05/26/18   [provider]  glucose blood test strip  05/06/17   [provider]  lidocaine-prilocaine (EMLA) cream Apply to affected area once 10/24/20   Lequita Asal, MD  losartan (COZAAR) 100 MG tablet Take 100 mg by mouth daily.    [provider]  magic mouthwash w/lidocaine SOLN Take 5 mLs by mouth.    [provider]  metFORMIN (GLUCOPHAGE-XR) 500 MG 24 hr tablet Take 500 mg by mouth 2 (two) times daily. Takes 2 tablets and 1 tablet in the pm    [provider]  ondansetron (ZOFRAN) 8 MG tablet Take 1 tablet (8 mg total) by mouth 2 (two) times daily as needed. Start on the third day after cisplatin chemotherapy. Patient not taking: Reported on 01/16/2021 10/24/20   Lequita Asal, MD  prochlorperazine (COMPAZINE) 10 MG tablet Take 1 tablet (10 mg total) by mouth every 6 (six) hours as needed (Nausea or vomiting). Patient not taking: Reported on 01/16/2021 10/24/20   Lequita Asal, MD  simvastatin (ZOCOR) 40 MG tablet Take 40 mg by mouth daily at 6 PM.    [provider]  sucralfate (CARAFATE) 1 g tablet Take 0.5 tablets (0.5 g total) by mouth 3 (three) times daily. Dissolve in warm water, swish and swallow 12/11/20   Noreene Filbert, MD  traZODone (DESYREL) 50 MG tablet Take 0.5-1 tablets (25-50 mg total) by mouth at bedtime as needed for sleep. 01/12/21   Borders, Kirt Boys, NP  VITAMIN D PO Take 800 Units by mouth daily.    [provider]    Allergies Patient has no known allergies.  Family History  Problem Relation Age of Onset  . Cancer Mother   . Colon cancer Neg Hx    . Colon polyps Neg Hx   . Esophageal cancer Neg Hx   . Rectal cancer Neg Hx   . Stomach cancer Neg Hx     Social History Social History   Tobacco Use  . Smoking status: Former Smoker    Quit date: 1986    Years since quitting: 36.1  . Smokeless tobacco: Never Used  Vaping Use  . Vaping Use: Never used  Substance Use Topics  . Alcohol use: Not Currently    Comment: occasional - 1 glass wine/month  . Drug use: Never      Review of Systems Constitutional: No fever/chills Eyes: No visual changes. ENT: No sore throat. Cardiovascular: No chest pain Respiratory: Positive for SOB Gastrointestinal: No abdominal pain.  No nausea, no vomiting.  No diarrhea.  No constipation. Genitourinary: Negative for dysuria. Musculoskeletal: Negative for back pain. Skin: Negative for rash. Neurological: Negative for headaches,  focal weakness or numbness. All other ROS negative ____________________________________________   PHYSICAL EXAM:  VITAL SIGNS: ED Triage Vitals  Enc Vitals Group     BP 01/19/21 1427 129/70     Pulse Rate 01/19/21 1427 (!) 57     Resp 01/19/21 1427 18     Temp 01/19/21 1427 (!) 97.4 F (36.3 C)     Temp Source 01/19/21 1427 Oral     SpO2 01/19/21 1427 100 %     Weight 01/19/21 1427 189 lb 2.5 oz (85.8 kg)     Height 01/19/21 1427 5\' 10"  (1.778 m)     Head Circumference --      Peak Flow --      Pain Score 01/19/21 1429 0     Pain Loc --      Pain Edu? --      Excl. in [place]? --     Constitutional: Alert and oriented. Well appearing and in no acute distress. Eyes: Conjunctivae are normal. EOMI. Head: Atraumatic. Nose: No congestion/rhinnorhea. Mouth/Throat: Mucous membranes are moist.   Neck: No stridor. Trachea Midline. FROM Cardiovascular: Normal rate, regular rhythm. Grossly normal heart sounds.  Good peripheral circulation. Respiratory: Clear lungs, no increased work of breathing Gastrointestinal: Soft and nontender. No distention. No abdominal  bruits.  Musculoskeletal: No lower extremity tenderness nor edema.  No joint effusions. Neurologic:  Normal speech and language. No gross focal neurologic deficits are appreciated.  Skin:  Skin is warm, dry and intact. No rash noted. Psychiatric: Mood and affect are normal. Speech and behavior are normal. GU: Deferred   ____________________________________________   LABS (all labs ordered are listed, but only abnormal results are displayed)  Labs Reviewed  CBC WITH DIFFERENTIAL/PLATELET - Abnormal; Notable for the following components:      Result Value   WBC 3.3 (*)    RBC 3.49 (*)    Hemoglobin 11.2 (*)    HCT 32.2 (*)    RDW 20.0 (*)    Platelets 126 (*)    Lymphs Abs 0.4 (*)    Abs Immature Granulocytes 0.15 (*)    All other components within normal limits  COMPREHENSIVE METABOLIC PANEL - Abnormal; Notable for the following components:   Sodium 131 (*)    Chloride 96 (*)    Glucose, Bld 178 (*)    BUN 28 (*)    Total Protein 6.4 (*)    All other components within normal limits  TROPONIN I (HIGH SENSITIVITY) - Abnormal; Notable for the following components:   Troponin I (High Sensitivity) 23 (*)    All other components within normal limits  RESP PANEL BY RT-PCR (FLU A&B, COVID) ARPGX2  LACTIC ACID, PLASMA  PROTIME-INR  APTT  LACTIC ACID, PLASMA  BRAIN NATRIURETIC PEPTIDE   ____________________________________________   ED ECG REPORT I, Vanessa Greenfield, the attending physician, personally viewed and interpreted this ECG.  Sinus bradycardia rate of 58, no ST elevation, no T wave inversions, normal intervals ____________________________________________  RADIOLOGY Robert Bellow, personally viewed and evaluated these images (plain radiographs) as part of my medical decision making, as well as reviewing the written report by the radiologist.  ED MD interpretation: Bilateral PEs  Official radiology report(s): CT CHEST ABDOMEN PELVIS W CONTRAST  Result Date:  01/19/2021 CLINICAL DATA:  Restaging post treatment for base of tongue carcinoma. Distal esophageal carcinoma subsequently diagnosed. Chemotherapy and radiation therapy in January. Shortness of breath on exertion. EXAM: CT CHEST, ABDOMEN, AND PELVIS WITH CONTRAST TECHNIQUE: Multidetector CT  imaging of the chest, abdomen and pelvis was performed following the standard protocol during bolus administration of intravenous contrast. CONTRAST:  129mL OMNIPAQUE IOHEXOL 300 MG/ML  SOLN COMPARISON:  PET-CT 10/25/2020.  Abdominopelvic CT 09/24/2013. FINDINGS: CT CHEST FINDINGS Cardiovascular: The pulmonary arteries are well opacified with contrast. There is extensive nearly occlusive pulmonary thromboembolic disease bilaterally with involvement of the main and lobar right and left pulmonary arteries. Thrombus in the left pulmonary artery measures up to 4.4 cm on image 30/2. There is evidence of right ventricular strain with the right ventricular lumen measuring 4.7 cm and the left ventricular lumen measuring 3.7 cm. Mild atherosclerosis of the aorta, great vessels and coronary arteries. Right IJ Port-A-Cath extends to the superior cavoatrial junction. Overall heart size is normal, and there is no pericardial fluid. Mediastinum/Nodes: There are no enlarged mediastinal, hilar or axillary lymph nodes. Small hiatal hernia with mild distal esophageal wall thickening, but no apparent focal mass lesion. The trachea and thyroid gland appear unremarkable. Lungs/Pleura: No pleural effusion or pneumothorax. Mild centrilobular emphysema. No evidence of pulmonary infarct, infiltrate or suspicious nodule. Musculoskeletal/Chest wall: No chest wall mass or suspicious osseous findings. CT ABDOMEN AND PELVIS FINDINGS Hepatobiliary: 4 mm low-density lesion in the left lobe on image 57/2 is similar to remote abdominal CT and appears benign. No new or enlarging hepatic lesions. No significant biliary dilatation post cholecystectomy. Stable  pneumobilia attributed to prior sphincterotomy. Pancreas: Unremarkable. No pancreatic ductal dilatation or surrounding inflammatory changes. Spleen: Normal in size without focal abnormality. Adrenals/Urinary Tract: Both adrenal glands appear normal. The kidneys appear normal without evidence of urinary tract calculus, suspicious lesion or hydronephrosis. No bladder abnormalities are seen. Stomach/Bowel: As above, distal esophageal wall thickening without apparent focal mass lesion. There is a small hiatal hernia. The proximal stomach is incompletely distended. The small bowel, appendix and colon demonstrate no significant findings. There are mild sigmoid colon diverticular changes. Vascular/Lymphatic: There are no enlarged abdominal or pelvic lymph nodes. Mild aortic and branch vessel atherosclerosis without acute vascular findings. The portal, superior mesenteric and splenic veins are patent. Reproductive: Mild enlargement of the prostate gland. Other: Stable asymmetric prominent fat in the right inguinal canal. Postsurgical changes in the anterior abdominal wall with stable areas of laxity containing fat. No ascites or peritoneal nodularity. Musculoskeletal: No acute or significant osseous findings. IMPRESSION: 1. Extensive nearly occlusive pulmonary thromboembolic disease bilaterally with CT evidence of right heart strain consistent with at least submassive (intermediate risk) PE. The presence of right heart strain has been associated with an increased risk of morbidity and mortality. Please refer to the "PE Focused" order set in EPIC. 2. No evidence of metastatic disease within the chest, abdomen or pelvis. Improvement in previously demonstrated distal esophageal wall thickening without apparent focal mass lesion. 3. Aortic Atherosclerosis (ICD10-I70.0) and Emphysema (ICD10-J43.9). 4. Critical Value/emergent results were called by telephone at the time of interpretation on 01/19/2021 at 2:08 pm to provider [facility] , who verbally acknowledged these results. Electronically Signed   By: Richardean Sale M.D.   On: 01/19/2021 14:19    ____________________________________________   PROCEDURES  Procedure(s) performed (including Critical Care):  .Critical Care Performed by: Vanessa Bethel, MD Authorized by: Vanessa , MD   Critical care provider statement:    Critical care time (minutes):  45   Critical care was necessary to treat or prevent imminent or life-threatening deterioration of the following conditions:  Respiratory failure   Critical care was time spent personally by me on  the following activities:  Discussions with consultants, evaluation of patient's response to treatment, examination of patient, ordering and performing treatments and interventions, ordering and review of laboratory studies, ordering and review of radiographic studies, pulse oximetry, re-evaluation of patient's condition, obtaining history from patient or surrogate and review of old charts .1-3 Lead EKG Interpretation Performed by: Vanessa [place], MD Authorized by: Vanessa [place], MD     Interpretation: abnormal     ECG rate:  50s   ECG rate assessment: bradycardic     Rhythm: sinus bradycardia     Ectopy: none     Conduction: normal       ____________________________________________   INITIAL IMPRESSION / ASSESSMENT AND PLAN / ED COURSE   Sean Garner. was evaluated in Emergency Department on 01/19/2021 for the symptoms described in the history of present illness. He was evaluated in the context of the global COVID-19 pandemic, which necessitated consideration that the patient might be at risk for infection with the SARS-CoV-2 virus that causes COVID-19. Institutional protocols and algorithms that pertain to the evaluation of patients at risk for COVID-19 are in a state of rapid change based on information released by regulatory bodies including the CDC and federal and state organizations. These policies  and algorithms were followed during the patient's care in the ED.     Pt presents with SOB.  I reviewed patient's outpatient CT that does show bilateral PEs.  Will get labs to evaluate for anemia and look for demand ischemia.  Before starting heparin will get back his hemoglobin level.  Patient does have a history of cancer but denies any brain masses or history of GI bleeds that would put him at high risk for the heparin.  Patient denies any Covid symptoms but will get testing.  We will keep patient cardiac monitor due to PE and risk of arrhythmia  I discussed with Dr. Delana Meyer who evaluated patient who recommended admission to the hospital team and started on heparin and they will consider him for thrombectomy depending on his clinical course  Patient had labs on 3/1 and hemoglobin was 10.  Denies any symptoms of GI bleed therefore will initiate heparin  White count slightly low at 3.3 kidney function is normal.  Troponin slightly elevated at 23 mostly from the demand from the PE.  We will discuss with hospital team for admission   ____________________________________________   FINAL CLINICAL IMPRESSION(S) / ED DIAGNOSES   Final diagnoses:  Acute saddle pulmonary embolism with acute cor pulmonale (HCC)     MEDICATIONS GIVEN DURING THIS VISIT:  Medications  heparin bolus via infusion 6,000 Units (has no administration in time range)  heparin ADULT infusion 100 units/mL (25000 units/244mL) (has no administration in time range)     ED Discharge Orders    None       Note:  This document was prepared using Dragon voice recognition software and may include unintentional dictation errors.   Vanessa [place], MD 01/19/21 [phone]

## 2021-01-19 NOTE — Consult Note (Signed)
[facility] Vascular Consult Note  MRN : [id number]  Trust Leh. is a 75 y.o. (01-03-1946) male who presents with chief complaint of No chief complaint on file. Marland Kitchen  History of Present Illness:   I am asked to evaluate the patient by Dr. Jari Pigg.  Patient is 75 year old gentleman that was sent to the [facility] regional emergency room after a CT scan to evaluate his tumor burden demonstrated bilateral pulmonary emboli.  The patient does note some shortness of breath in retrospect however he also notes that he walked from the car into the emergency room with minimal difficulty.  He denies severe shortness of breath or pleuritic chest pains.  He does note he has had some right leg swelling over the past week or so but this was not severe enough to make much out of.  He is currently being worked up for treatment of his esophageal carcinoma at [facility].  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  I have asked to evaluate for possible thrombectomy.  Current Facility-Administered Medications  Medication Dose Route Frequency Provider Last Rate Last Admin  . 0.9 %  sodium chloride infusion  500 mL Intravenous Once Pyrtle, Lajuan Lines, MD      . acetaminophen (TYLENOL) tablet 650 mg  650 mg Oral Q6H PRN Howerter, Justin B, DO       Or  . acetaminophen (TYLENOL) suppository 650 mg  650 mg Rectal Q6H PRN Howerter, Justin B, DO      . albuterol (VENTOLIN HFA) 108 (90 Base) MCG/ACT inhaler 1-2 puff  1-2 puff Inhalation Q4H PRN Howerter, Justin B, DO      . ALPRAZolam (XANAX) tablet 0.25 mg  0.25 mg Oral QHS PRN Howerter, Justin B, DO      . [START ON 01/20/2021] brimonidine-timolol (COMBIGAN) 0.2-0.5 % ophthalmic solution 1 drop  1 drop Left Eye Daily Howerter, Justin B, DO      . [START ON 01/20/2021] citalopram (CELEXA) tablet 20 mg  20 mg Oral Daily Howerter, Justin B, DO      . [START ON 01/20/2021] fluticasone (FLONASE) 50 MCG/ACT nasal spray 2 spray  2  spray Each Nare Daily Howerter, Justin B, DO      . heparin ADULT infusion 100 units/mL (25000 units/260mL)  1,500 Units/hr Intravenous Continuous Howerter, Justin B, DO 15 mL/hr at 01/19/21 1557 1,500 Units/hr at 01/19/21 1557  . insulin aspart (novoLOG) injection 0-9 Units  0-9 Units Subcutaneous TID WC Howerter, Justin B, DO      . lactated ringers infusion   Intravenous Continuous Howerter, Justin B, DO      . [START ON 01/20/2021] pantoprazole (PROTONIX) EC tablet 40 mg  40 mg Oral Daily Howerter, Justin B, DO      . simvastatin (ZOCOR) tablet 40 mg  40 mg Oral q1800 Howerter, Justin B, DO       Current Outpatient Medications  Medication Sig Dispense Refill  . ALPRAZolam (XANAX) 0.25 MG tablet Take 1 tablet (0.25 mg total) by mouth at bedtime as needed for anxiety. 60 tablet 0  . brimonidine-timolol (COMBIGAN) 0.2-0.5 % ophthalmic solution Place 1 drop into the left eye daily.    . calcium carbonate (OSCAL) 1500 (600 Ca) MG TABS tablet Take by mouth 2 (two) times daily with a meal.    . citalopram (CELEXA) 20 MG tablet Take 1.5 tablets (30 mg total) by mouth daily with breakfast. 45 tablet 0  . dexamethasone (DECADRON)  4 MG tablet 2 TABS BY MOUTH DAILY. TAKE DAILY X 3 DAYS STARTING THE DAY AFTER CHEMOTHERAPY. TAKE WITH FOOD. 30 tablet 1  . dexamethasone (DECADRON) 4 MG tablet Take 1 tablet (4 mg total) by mouth 2 (two) times daily with a meal. 60 tablet 0  . esomeprazole (NEXIUM) 20 MG capsule Take 40 mg by mouth daily at 12 noon.    . fluticasone (FLONASE) 50 MCG/ACT nasal spray Place into both nostrils.    Marland Kitchen glipiZIDE (GLUCOTROL XL) 5 MG 24 hr tablet Take 5 mg by mouth 2 (two) times daily.  1  . glucose blood test strip     . lidocaine-prilocaine (EMLA) cream Apply to affected area once 30 g 3  . losartan (COZAAR) 100 MG tablet Take 100 mg by mouth daily.    . magic mouthwash w/lidocaine SOLN Take 5 mLs by mouth.    . metFORMIN (GLUCOPHAGE-XR) 500 MG 24 hr tablet Take 500 mg by mouth 2  (two) times daily. Takes 2 tablets and 1 tablet in the pm    . ondansetron (ZOFRAN) 8 MG tablet Take 1 tablet (8 mg total) by mouth 2 (two) times daily as needed. Start on the third day after cisplatin chemotherapy. (Patient not taking: Reported on 01/16/2021) 30 tablet 1  . prochlorperazine (COMPAZINE) 10 MG tablet Take 1 tablet (10 mg total) by mouth every 6 (six) hours as needed (Nausea or vomiting). (Patient not taking: Reported on 01/16/2021) 30 tablet 1  . simvastatin (ZOCOR) 40 MG tablet Take 40 mg by mouth daily at 6 PM.    . sucralfate (CARAFATE) 1 g tablet Take 0.5 tablets (0.5 g total) by mouth 3 (three) times daily. Dissolve in warm water, swish and swallow 60 tablet 1  . traZODone (DESYREL) 50 MG tablet Take 0.5-1 tablets (25-50 mg total) by mouth at bedtime as needed for sleep. 30 tablet 2  . VITAMIN D PO Take 800 Units by mouth daily.     Facility-Administered Medications Ordered in Other Encounters  Medication Dose Route Frequency Provider Last Rate Last Admin  . 0.9 % NaCl with KCl 20 mEq/ L  infusion   Intravenous Once Corcoran, Melissa C, MD      . CISplatin (PLATINOL) 87 mg in sodium chloride 0.9 % 250 mL chemo infusion  40 mg/m2 (Order-Specific) Intravenous Once Nolon Stalls C, MD      . dexamethasone (DECADRON) 10 mg in sodium chloride 0.9 % 50 mL IVPB  10 mg Intravenous Once Corcoran, Melissa C, MD      . fosaprepitant (EMEND) 150 mg in sodium chloride 0.9 % 145 mL IVPB  150 mg Intravenous Once Corcoran, Melissa C, MD      . magnesium sulfate IVPB 2 g 50 mL  2 g Intravenous Once Lequita Asal, MD        Past Medical History:  Diagnosis Date  . Anxiety   . 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 Cataract    lt eye repair; present in rt eye but not ripe yet.  . Claustrophobia   . Diabetes mellitus without complication ([place])   . GERD (gastroesophageal reflux disease)   . Glaucoma    using gtts for this.  . Hypercholesterolemia   .  Hypertension     Past Surgical History:  Procedure Laterality Date  . CATARACT EXTRACTION W/PHACO Left 12/03/2017   Procedure: CATARACT EXTRACTION PHACO AND INTRAOCULAR LENS PLACEMENT (IOC) COMPLICATED DIABETIC LEFT;  Surgeon:  Leandrew Koyanagi, MD;  Location: [place];  Service: Ophthalmology;  Laterality: Left;  Diabetic - oral meds  . CHOLECYSTECTOMY    . COLONOSCOPY  2016  . PORTA CATH INSERTION N/A 11/13/2020   Procedure: PORTA CATH INSERTION;  Surgeon: Algernon Huxley, MD;  Location: [place] [facility];  Service: Cardiovascular;  Laterality: N/A;    Social History Social History   Tobacco Use  . Smoking status: Former Smoker    Quit date: 1986    Years since quitting: 36.1  . Smokeless tobacco: Never Used  Vaping Use  . Vaping Use: Never used  Substance Use Topics  . Alcohol use: Not Currently    Comment: occasional - 1 glass wine/month  . Drug use: Never    Family History Family History  Problem Relation Age of Onset  . Cancer Mother   . Colon cancer Neg Hx   . Colon polyps Neg Hx   . Esophageal cancer Neg Hx   . Rectal cancer Neg Hx   . Stomach cancer Neg Hx   No family history of bleeding/clotting disorders, porphyria or autoimmune disease   No Known Allergies   REVIEW OF SYSTEMS (Negative unless checked)  Constitutional: [] Weight loss  [] Fever  [] Chills Cardiac: [] Chest pain   [] Chest pressure   [] Palpitations   [] Shortness of breath when laying flat   [] Shortness of breath at rest   [] Shortness of breath with exertion. Vascular:  [] Pain in legs with walking   [] Pain in legs at rest   [] Pain in legs when laying flat   [] Claudication   [] Pain in feet when walking  [] Pain in feet at rest  [] Pain in feet when laying flat   [] History of DVT   [] Phlebitis   [] Swelling in legs   [] Varicose veins   [] Non-healing ulcers Pulmonary:   [] Uses home oxygen   [] Productive cough   [] Hemoptysis   [] Wheeze  [] COPD   [] Asthma Neurologic:  [] Dizziness  [] Blackouts    [] Seizures   [] History of stroke   [] History of TIA  [] Aphasia   [] Temporary blindness   [] Dysphagia   [] Weakness or numbness in arms   [] Weakness or numbness in legs Musculoskeletal:  [] Arthritis   [x] Joint swelling   [x] Joint pain   [] Low back pain Hematologic:  [] Easy bruising  [] Easy bleeding   [] Hypercoagulable state   [] Anemic  [] Hepatitis Gastrointestinal:  [] Blood in stool   [] Vomiting blood  [] Gastroesophageal reflux/heartburn   [] Difficulty swallowing. Genitourinary:  [] Chronic kidney disease   [] Difficult urination  [] Frequent urination  [] Burning with urination   [] Blood in urine Skin:  [] Rashes   [] Ulcers   [] Wounds Psychological:  [] History of anxiety   []  History of major depression.    Physical Examination  Vitals:   01/19/21 1427 01/19/21 1500 01/19/21 1530 01/19/21 1641  BP: 129/70 133/66 124/65 (!) 130/58  Pulse: (!) 57 61 61   Resp: 18 18 16 14   Temp: (!) 97.4 F (36.3 C)     TempSrc: Oral     SpO2: 100% 94% (!) 88% 97%  Weight: 85.8 kg     Height: 5\' 10"  (1.778 m)      Body mass index is 27.14 kg/m.  Head: [place]/AT, No temporalis wasting. Prominent temp pulse not noted. Ear/Nose/Throat: Nares w/o erythema or drainage, oropharynx w/o obsrtuction, Mallampati score: 3.   Eyes: PER, Sclera nonicteric.  Neck: Supple, no nuchal rigidity.  No bruit or JVD.  Pulmonary:  Breath sounds equal bilaterally, no use of accessory  muscles.  Cardiac: RRR, normal S1, S2, no Murmurs, rubs or gallops. Vascular: Palpable radial pulses bilaterally Gastrointestinal: soft, non-tender, non-distended.  Musculoskeletal: Moves all extremities.  No deformity or atrophy. No edema. Neurologic: CN 2-12 intact. Symmetrical.  Speech is fluent.  Psychiatric: Judgment intact, Mood & affect appropriate for pt's clinical situation. Dermatologic: No rashes or ulcers noted.  No cellulitis or open wounds.       CBC Lab Results  Component Value Date   WBC 3.3 (L) 01/19/2021   HGB 11.2 (L)  01/19/2021   HCT 32.2 (L) 01/19/2021   MCV 92.3 01/19/2021   PLT 126 (L) 01/19/2021    BMET    Component Value Date/Time   NA 131 (L) 01/19/2021 1459   NA 135 (L) 09/29/2013 0534   K 3.6 01/19/2021 1459   K 3.8 09/29/2013 0534   CL 96 (L) 01/19/2021 1459   CL 101 09/29/2013 0534   CO2 28 01/19/2021 1459   CO2 29 09/29/2013 0534   GLUCOSE 178 (H) 01/19/2021 1459   GLUCOSE 258 (H) 09/29/2013 0534   BUN 28 (H) 01/19/2021 1459   BUN 12 09/29/2013 0534   CREATININE 0.81 01/19/2021 1459   CREATININE 1.15 09/29/2013 0534   CALCIUM 9.0 01/19/2021 1459   CALCIUM 8.7 09/29/2013 0534   GFRNONAA >60 01/19/2021 1459   GFRNONAA >60 09/29/2013 0534   GFRAA >60 09/29/2013 0534   Estimated Creatinine Clearance: 82.6 mL/min (by C-G formula based on SCr of 0.81 mg/dL).  COAG Lab Results  Component Value Date   INR 1.1 01/19/2021    Radiology CT angiogram of the chest is reviewed by me personally and shows bilateral PEs in the distal main pulmonary arteries extending into the lobar branches.   Assessment/Plan 1.  Bilateral PE: Although his PEs are extensive and he does have signs of right heart strain he is not in extremis while at rest.  I have discussed thrombectomy with him and his wife.  For now we will hold off and start heparin gtt.  We will see how he does when he is able to ambulate and if there is severe functional problems we can move forward with thrombectomy on Tuesday.  Patient and wife agree with this plan.   A total of 45 minutes was spent with this patient and greater than 50% was spent in counseling and coordination of care with the patient.  Discussion included the treatment options for vascular disease including indications for surgery and intervention.  Also discussed is the appropriate timing of treatment.  In addition medical therapy was discussed.    Hortencia Pilar, MD  01/19/2021 5:53 PM

## 2021-01-19 NOTE — Progress Notes (Signed)
ANTICOAGULATION CONSULT NOTE - Initial Consult  Pharmacy Consult for Heparin drip Indication: pulmonary embolus  No Known Allergies  Patient Measurements: Height: 5\' 10"  (177.8 cm) Weight: 85.8 kg (189 lb 2.5 oz) IBW/kg (Calculated) : 73 Heparin Dosing Weight: 86  Vital Signs: Temp: 97.4 F (36.3 C) (03/04 1427) Temp Source: Oral (03/04 1427) BP: 124/65 (03/04 1530) Pulse Rate: 61 (03/04 1530)  Labs: Recent Labs    01/19/21 1459  HGB 11.2*  HCT 32.2*  PLT 126*  APTT 26  LABPROT 14.1  INR 1.1  CREATININE 0.81  TROPONINIHS 23*    Estimated Creatinine Clearance: 82.6 mL/min (by C-G formula based on SCr of 0.81 mg/dL).   Medical History: Past Medical History:  Diagnosis Date  . Anxiety   . 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 Cataract    lt eye repair; present in rt eye but not ripe yet.  . Claustrophobia   . Diabetes mellitus without complication ([place])   . GERD (gastroesophageal reflux disease)   . Glaucoma    using gtts for this.  . Hypercholesterolemia   . Hypertension     Medications:  Scheduled:   Infusions:  . sodium chloride    . heparin 1,500 Units/hr (01/19/21 1557)    Assessment: 75 yo M to start Heparin drip for PE. No anticoag PTA  per Med Rec Hgb 11.2  plt 126  INR 1.1  APTT 26  Goal of Therapy:  Heparin level 0.3-0.7 units/ml Monitor platelets by anticoagulation protocol: Yes   Plan:  Give 6000 units bolus x 1 Start heparin infusion at 1500 units/hr Check anti-Xa level in 8 hours and daily while on heparin Continue to monitor H&H and platelets  Bristyl Mclees A 01/19/2021,4:37 PM

## 2021-01-19 NOTE — H&P (Signed)
History and Physical    PLEASE NOTE THAT [employer] SOFTWARE WAS USED IN THE CONSTRUCTION OF THIS NOTE.   Sean Garner. FYB:[id number] DOB: 1946/01/22 DOA: 01/19/2021  PCP: Derinda Late, MD Patient coming from: home   I have personally briefly reviewed patient's old medical records in [place]  Chief Complaint: Bilateral pulmonary emboli found on outpatient CT chest  HPI: Sean Garner. is a 75 y.o. 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  The following history is obtained via my discussions with the patient as well as the discussions with the patient's wife, who was present at bedside, in addition to my discussions with the emergency department physician, and via chart review.  The patient carries a diagnosis of squamous cell cancer of the tongue status post chemotherapy as well as 35 rounds of radiation, with ensuing diagnosis of distal esophageal carcinoma.  Outpatient CT chest, abdomen, pelvis was ordered for the purpose of restaging patient's distal esophageal carcinoma.  The CT scan was performed earlier today and demonstrated extensive bilateral pulmonary emboli with CT evidence of right heart strain consistent with submassive pulmonary emboli, but showed no evidence of metastatic disease in the chest, abdomen, pelvis, as well as interval improvement in prior demonstrated distal esophageal wall thickening without apparent focal mass lesion.  Subsequently, the patient  was contacted by his oncologist with the above results, and associated instructions to present to the local emergency department for further evaluation and management of new diagnosis of bilateral pulmonary emboli.  The patient denies any known prior history of PE or DVT, nor any known family history thereof.  He denies any recent travel, trauma or periods of prolonged diminished ambulatory status.  No recent surgical procedures.  He reports mild shortness of breath over the course of the last week in the absence of any associated orthopnea or PND.  He notes development over that time of new onset mild swelling in the right lower extremity in the absence of any significant erythema or calf tenderness.  Denies any associated chest pain, diaphoresis, rotations, nausea, vomiting, presyncope, syncope, or dizziness.  Not associate with any recent cough, wheezing, or hemoptysis.  Denies any recent melena or hematochezia.  Not associate with any subjective fever, chills, rigors or generalized myalgias.  No recent headache, neck stiffness, rhinitis, rhinorrhea, abdominal pain, diarrhea, or rash.  No known recent COVID-19 exposure.  No recent dysuria, gross hematuria, or change in urinary urgency/frequency.  The patient reports that he is on no blood thinning agents at home, including no use of aspirin.  Denies any known underlying baseline supplemental oxygen requirement.   The patient reports that most recent chemotherapy occurred approximately 1 week ago.  He has also undergone a total of 35 rounds of radiation, which she reports represents the totality of the current treatment plan.  His outpatient oncologist is Dr. Mike Gip.      ED Course:  Vital signs in the ED were notable for the following: Tetramex 97.4, heart rate 57-61; blood pressure 124/65 -133/66; respiratory rate 16-18, initial oxygen saturation noted to  be 88% on room air, which improved to 100% 2 L nasal cannula.   Labs were notable for the  following: CMP was notable for the following: Sodium 131, chloride 96, bicarbonate 28, creatinine 0.1 compared to 0.9 on 01/16/2021, glucose 178.  CBC notable for white cell count of 3300 with 2,400 neutrophils, compared to white blood cell count of 2400 on 01/16/2021, hemoglobin 11.2, compared to 10.6 on 01/16/2021, and platelets 126 relative to 102 on 01/16/2021.  INR 1.1.  BNP 220 with no prior BMP available for point comparison.  High-sensitivity troponin high x1 found to be 23, with no prior data when available for point of comparison.  Nasopharyngeal COVID-19 PCR was checked in the ED today, and found to be negative.  EKG showed sinus rhythm with heart rate 58, QTc 445, nonspecific T wave inversion in V1/V2, and no evidence of ST changes, including no evidence of ST elevation.  The patient's case was discussed with the on-call vascular surgeon, Dr. Ronalee Belts, who recommended admission to the hospitalist service for further evaluation and management of presenting acute bilateral submassive pulmonary emboli, including initiation of heparin drip. Vascular surgery will consult and follow. Dr. Ronalee Belts did not feel that thrombectomy was indicated at this time, but will monitor closely for development of indications for such.  While in the ED, the following were administered: Heparin bolus followed by initiation of heparin drip.    Review of Systems: As per HPI otherwise 10 point review of systems negative.   Past Medical History:  Diagnosis Date  . Anxiety   . Cancer [facility])    Head/throat Cancer at the base of the tongue  . Cataract    lt eye repair; present in rt eye but not ripe yet.  . Claustrophobia   . Diabetes mellitus without complication ([place])   . GERD (gastroesophageal reflux disease)   . Glaucoma    using gtts for this.  . Hypercholesterolemia   . Hypertension     Past Surgical History:  Procedure Laterality Date  . CATARACT EXTRACTION W/PHACO Left 12/03/2017   Procedure: CATARACT  EXTRACTION PHACO AND INTRAOCULAR LENS PLACEMENT ([place]) COMPLICATED DIABETIC LEFT;  Surgeon: Leandrew Koyanagi, MD;  Location: [facility];  Service: Ophthalmology;  Laterality: Left;  Diabetic - oral meds  . CHOLECYSTECTOMY    . COLONOSCOPY  2016  . PORTA CATH INSERTION N/A 11/13/2020   Procedure: PORTA CATH INSERTION;  Surgeon: Algernon Huxley, MD;  Location: [facility];  Service: Cardiovascular;  Laterality: N/A;    Social History:  reports that he quit smoking about 36 years ago. He has never used smokeless tobacco. He reports previous alcohol use. He reports that he does not use drugs.   No Known Allergies  Family History  Problem Relation Age of Onset  . Cancer Mother   . Colon cancer Neg Hx   . Colon polyps Neg Hx   . Esophageal cancer Neg Hx   . Rectal cancer Neg Hx   . Stomach cancer Neg Hx     Prior to Admission medications   Medication Sig Start Date End Date Taking? Authorizing Provider  ALPRAZolam (XANAX) 0.25 MG tablet Take 1 tablet (0.25 mg total) by mouth at bedtime as needed for anxiety. 01/12/21   Borders, Kirt Boys, NP  brimonidine-timolol (COMBIGAN) 0.2-0.5 % ophthalmic solution Place 1 drop into the left eye daily. 06/09/19   [provider]  calcium carbonate (OSCAL) 1500 (600 Ca) MG TABS tablet Take by mouth 2 (two) times  daily with a meal.    [provider]  citalopram (CELEXA) 20 MG tablet Take 1.5 tablets (30 mg total) by mouth daily with breakfast. 01/01/21   Eappen, Ria Clock, MD  dexamethasone (DECADRON) 4 MG tablet 2 TABS BY MOUTH DAILY. TAKE DAILY X 3 DAYS STARTING THE DAY AFTER CHEMOTHERAPY. TAKE WITH FOOD. 01/01/21   Lequita Asal, MD  dexamethasone (DECADRON) 4 MG tablet Take 1 tablet (4 mg total) by mouth 2 (two) times daily with a meal. 01/01/21   Chrystal, Eulas Post, MD  esomeprazole (NEXIUM) 20 MG capsule Take 40 mg by mouth daily at 12 noon.    [provider]  fluticasone (FLONASE) 50 MCG/ACT nasal spray  Place into both nostrils. 12/11/20   [provider]  glipiZIDE (GLUCOTROL XL) 5 MG 24 hr tablet Take 5 mg by mouth 2 (two) times daily. 05/26/18   [provider]  glucose blood test strip  05/06/17   [provider]  lidocaine-prilocaine (EMLA) cream Apply to affected area once 10/24/20   Lequita Asal, MD  losartan (COZAAR) 100 MG tablet Take 100 mg by mouth daily.    [provider]  magic mouthwash w/lidocaine SOLN Take 5 mLs by mouth.    [provider]  metFORMIN (GLUCOPHAGE-XR) 500 MG 24 hr tablet Take 500 mg by mouth 2 (two) times daily. Takes 2 tablets and 1 tablet in the pm    [provider]  ondansetron (ZOFRAN) 8 MG tablet Take 1 tablet (8 mg total) by mouth 2 (two) times daily as needed. Start on the third day after cisplatin chemotherapy. Patient not taking: Reported on 01/16/2021 10/24/20   Lequita Asal, MD  prochlorperazine (COMPAZINE) 10 MG tablet Take 1 tablet (10 mg total) by mouth every 6 (six) hours as needed (Nausea or vomiting). Patient not taking: Reported on 01/16/2021 10/24/20   Lequita Asal, MD  simvastatin (ZOCOR) 40 MG tablet Take 40 mg by mouth daily at 6 PM.    [provider]  sucralfate (CARAFATE) 1 g tablet Take 0.5 tablets (0.5 g total) by mouth 3 (three) times daily. Dissolve in warm water, swish and swallow 12/11/20   Noreene Filbert, MD  traZODone (DESYREL) 50 MG tablet Take 0.5-1 tablets (25-50 mg total) by mouth at bedtime as needed for sleep. 01/12/21   Borders, Kirt Boys, NP  VITAMIN D PO Take 800 Units by mouth daily.    [provider]     Objective    Physical Exam: Vitals:   01/19/21 1427 01/19/21 1500 01/19/21 1530 01/19/21 1641  BP: 129/70 133/66 124/65 (!) 130/58  Pulse: (!) 57 61 61   Resp: 18 18 16 14   Temp: (!) 97.4 F (36.3 C)     TempSrc: Oral     SpO2: 100% 94% (!) 88% 97%  Weight: 85.8 kg     Height: 5\' 10"  (1.778 m)       General: appears to be  stated age; alert, oriented Skin: warm, dry, no rash Head:  AT/[place] Mouth:  Oral mucosa membranes appear moist, normal dentition Neck: supple; trachea midline Heart:  RRR; did not appreciate any M/R/G Lungs: CTAB, did not appreciate any wheezes, rales, or rhonchi Abdomen: + BS; soft, ND, NT Vascular: 2+ pedal pulses b/l; 2+ radial pulses b/l Extremities: trace edema in RLE; no edema noted in LLE; no muscle wasting Neuro: strength and sensation intact in upper and lower extremities b/l   Labs on Admission: I have personally reviewed following  labs and imaging studies  CBC: Recent Labs  Lab 01/16/21 0805 01/19/21 1459  WBC 2.4* 3.3*  NEUTROABS 1.8 2.4  HGB 10.6* 11.2*  HCT 32.3* 32.2*  MCV 92.3 92.3  PLT 102* 932*   Basic Metabolic Panel: Recent Labs  Lab 01/16/21 0805 01/19/21 1459  NA 135 131*  K 3.4* 3.6  CL 96* 96*  CO2 28 28  GLUCOSE 157* 178*  BUN 29* 28*  CREATININE 0.90 0.81  CALCIUM 9.3 9.0  MG 1.6*  --    GFR: Estimated Creatinine Clearance: 82.6 mL/min (by C-G formula based on SCr of 0.81 mg/dL). Liver Function Tests: Recent Labs  Lab 01/19/21 1459  AST 18  ALT 25  ALKPHOS 46  BILITOT 1.1  PROT 6.4*  ALBUMIN 4.1   No results for input(s): LIPASE, AMYLASE in the last 168 hours. No results for input(s): AMMONIA in the last 168 hours. Coagulation Profile: Recent Labs  Lab 01/19/21 1459  INR 1.1   Cardiac Enzymes: No results for input(s): CKTOTAL, CKMB, CKMBINDEX, TROPONINI in the last 168 hours. BNP (last 3 results) No results for input(s): PROBNP in the last 8760 hours. HbA1C: No results for input(s): HGBA1C in the last 72 hours. CBG: No results for input(s): GLUCAP in the last 168 hours. Lipid Profile: No results for input(s): CHOL, HDL, LDLCALC, TRIG, CHOLHDL, LDLDIRECT in the last 72 hours. Thyroid Function Tests: No results for input(s): TSH, T4TOTAL, FREET4, T3FREE, THYROIDAB in the last 72 hours. Anemia Panel: No results for  input(s): VITAMINB12, FOLATE, FERRITIN, TIBC, IRON, RETICCTPCT in the last 72 hours. Urine analysis:    Component Value Date/Time   COLORURINE YELLOW 11/27/2020 1036   APPEARANCEUR CLEAR 11/27/2020 1036   APPEARANCEUR Clear 09/24/2013 0955   LABSPEC 1.020 11/27/2020 1036   LABSPEC 1.026 09/24/2013 0955   PHURINE 6.0 11/27/2020 1036   GLUCOSEU 500 (A) 11/27/2020 1036   GLUCOSEU >=500 09/24/2013 0955   HGBUR NEGATIVE 11/27/2020 1036   BILIRUBINUR NEGATIVE 11/27/2020 1036   BILIRUBINUR Negative 09/24/2013 0955   KETONESUR NEGATIVE 11/27/2020 1036   PROTEINUR NEGATIVE 11/27/2020 1036   NITRITE NEGATIVE 11/27/2020 1036   LEUKOCYTESUR NEGATIVE 11/27/2020 1036   LEUKOCYTESUR Negative 09/24/2013 0955    Radiological Exams on Admission: CT CHEST ABDOMEN PELVIS W CONTRAST  Result Date: 01/19/2021 CLINICAL DATA:  Restaging post treatment for base of tongue carcinoma. Distal esophageal carcinoma subsequently diagnosed. Chemotherapy and radiation therapy in January. Shortness of breath on exertion. EXAM: CT CHEST, ABDOMEN, AND PELVIS WITH CONTRAST TECHNIQUE: Multidetector CT imaging of the chest, abdomen and pelvis was performed following the standard protocol during bolus administration of intravenous contrast. CONTRAST:  116mL OMNIPAQUE IOHEXOL 300 MG/ML  SOLN COMPARISON:  PET-CT 10/25/2020.  Abdominopelvic CT 09/24/2013. FINDINGS: CT CHEST FINDINGS Cardiovascular: The pulmonary arteries are well opacified with contrast. There is extensive nearly occlusive pulmonary thromboembolic disease bilaterally with involvement of the main and lobar right and left pulmonary arteries. Thrombus in the left pulmonary artery measures up to 4.4 cm on image 30/2. There is evidence of right ventricular strain with the right ventricular lumen measuring 4.7 cm and the left ventricular lumen measuring 3.7 cm. Mild atherosclerosis of the aorta, great vessels and coronary arteries. Right IJ Port-A-Cath extends to the superior  cavoatrial junction. Overall heart size is normal, and there is no pericardial fluid. Mediastinum/Nodes: There are no enlarged mediastinal, hilar or axillary lymph nodes. Small hiatal hernia with mild distal esophageal wall thickening, but no apparent focal mass lesion. The trachea and thyroid  gland appear unremarkable. Lungs/Pleura: No pleural effusion or pneumothorax. Mild centrilobular emphysema. No evidence of pulmonary infarct, infiltrate or suspicious nodule. Musculoskeletal/Chest wall: No chest wall mass or suspicious osseous findings. CT ABDOMEN AND PELVIS FINDINGS Hepatobiliary: 4 mm low-density lesion in the left lobe on image 57/2 is similar to remote abdominal CT and appears benign. No new or enlarging hepatic lesions. No significant biliary dilatation post cholecystectomy. Stable pneumobilia attributed to prior sphincterotomy. Pancreas: Unremarkable. No pancreatic ductal dilatation or surrounding inflammatory changes. Spleen: Normal in size without focal abnormality. Adrenals/Urinary Tract: Both adrenal glands appear normal. The kidneys appear normal without evidence of urinary tract calculus, suspicious lesion or hydronephrosis. No bladder abnormalities are seen. Stomach/Bowel: As above, distal esophageal wall thickening without apparent focal mass lesion. There is a small hiatal hernia. The proximal stomach is incompletely distended. The small bowel, appendix and colon demonstrate no significant findings. There are mild sigmoid colon diverticular changes. Vascular/Lymphatic: There are no enlarged abdominal or pelvic lymph nodes. Mild aortic and branch vessel atherosclerosis without acute vascular findings. The portal, superior mesenteric and splenic veins are patent. Reproductive: Mild enlargement of the prostate gland. Other: Stable asymmetric prominent fat in the right inguinal canal. Postsurgical changes in the anterior abdominal wall with stable areas of laxity containing fat. No ascites or  peritoneal nodularity. Musculoskeletal: No acute or significant osseous findings. IMPRESSION: 1. Extensive nearly occlusive pulmonary thromboembolic disease bilaterally with CT evidence of right heart strain consistent with at least submassive (intermediate risk) PE. The presence of right heart strain has been associated with an increased risk of morbidity and mortality. Please refer to the "PE Focused" order set in EPIC. 2. No evidence of metastatic disease within the chest, abdomen or pelvis. Improvement in previously demonstrated distal esophageal wall thickening without apparent focal mass lesion. 3. Aortic Atherosclerosis (ICD10-I70.0) and Emphysema (ICD10-J43.9). 4. Critical Value/emergent results were called by telephone at the time of interpretation on 01/19/2021 at 2:08 pm to provider [facility] , who verbally acknowledged these results. Electronically Signed   By: Richardean Sale M.D.   On: 01/19/2021 14:19     EKG: Independently reviewed, with result as described above.    Assessment/Plan   Sean Garner. is a 75 y.o. 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   Principal Problem:   Pulmonary embolism, bilateral (HCC) Active Problems:   Benign essential hypertension   GERD (gastroesophageal reflux disease)   Type 2 diabetes mellitus without complications (HCC)   Anxiety   Hyponatremia   SOB (shortness of breath)   Elevated troponin   Pancytopenia (HCC)    #) Acute bilateral  pulmonary emboli: In the setting of 1 week of shortness of breath, outpatient CT chest showed evidence of extensive bilateral pulmonary emboli with CT evidence of right heart strain consistent with submassive pulmonary emboli. No evidence of associated hypotension to warrant consideration for TPA administration at this time. Appears to be patient's first PE/DVT.  Appears to be provoked in the setting of the presence of hypercoagulable state on the basis of squamous cell cancer of the tongue followed  by distal esophageal carcinoma, without additional DVT/PE risk factors identified at this time.  Not on any blood thinners at home, including no aspirin.  Mildly elevated troponin appears consistent with finding of acute pulmonary emboli, and ACS is felt to be less likely at this time.  Presentation is not associate with any chest discomfort, and EKG shows nonspecific T wave inversion in V1/V2, but otherwise no straits acute ischemic changes, including no evidence of ST changes.   The patient's case was discussed with the on-call vascular surgeon, Dr. Ronalee Belts, who recommended admission to the hospitalist service for further evaluation and management of presenting acute bilateral submassive pulmonary emboli, including initiation of heparin drip. Vascular surgery will consult and follow. Dr. Ronalee Belts did not feel that thrombectomy was indicated at this time, but will monitor closely for development of indications for such.  Heparin drip was initiated in the ED today preceded by heparin bolus.  We will closely monitor ensuing CBC results in the context of pancytopenia from recent chemotherapy.     Plan: Continue heparin drip per recommendations of vascular surgery, which has been formally consulted, as above.  Monitor on telemetry.  Monitor continuous pulse oximetry.  Monitor for development of hypotension, which may represent an indication for TPA administration, as further noted above.  And albuterol nebulizer.   Repeat CBC in the morning.  Lactated Ringer's at 84 cc/h potential.  The dependent physiology associated with submassive bilateral acute pulmonary emboli with evidence of right heart strain.  Monitor strict I's and O's and daily weights.       #) Elevated troponin: mildly elevated initial troponin of 23, with no prior high-sensitivity troponin I value available for point comparison.  Suspect that this mildly elevated troponin is on the basis of supply demand mismatch in the setting of submassive acute bilateral pulmonary emboli, particularly given CT evidence of right heart strain, as above, as opposed to representing a type I process due to acute plaque rupture.  EKG shows nonspecific to inversion in V1/V2, but otherwise shows no evidence of acute changes, including no evidence of ST elevation.  CTA chest performed earlier today showed no evidence of acute intrathoracic process aside from the previously described acute pulmonary emboli, including no evidence of infiltrate, edema, pleural effusion, or pneumothorax.  Furthermore, presentation is not been associate with any chest pain.  Overall, ACS is felt to be less likely relative to type II supply demand mismatch, as above, will closely monitor on telemetry overnight, will treating underlying acute bilateral PE, as further described above, including heparin drip.   Plan: repeat troponin one additional troponin value, and if not significantly elevated relative to presenting value, will refrain from additional trending of troponin unless subsequent clinical change to warrant additional evaluation of such.  Add on serum magnesium level.  Monitor on telemetry.  Monitor on continuous pulse oximetry.  Further evaluation management presenting acute bilateral pulmonary emboli, as above, suspected driving force behind mildly elevated presenting troponin.     #) Acute hypoxic respiratory distress: in the context of no known baseline supplemental oxygen  requirements, presenting O2 sat noted to be 88% on room air, which is improved to 100% on 2 L nasal cannula, thereby meeting criteria for acute hypoxic respiratory distress as opposed to acute hypoxic respiratory failure at this time. No known chronic underlying pulmonary conditions, although there was reportedly some evidence of emphysema today CT chest, which is in the context of the patient's report of being a former smoker.  Clinically, there  is not appear to be overt evidence for an acute COPD exacerbation at this time.  Presenting acute hypoxic respiratory distress presumed basis of submassive acute bilateral pulmonary emboli, as further described above.  Of note, earlier today showed no evidence of infiltrate, edema, effusion, or pneumothorax.  ACS evaluate less likely at this time, as further described above.  No clinical or radiographic evidence to suggest acutely decompensated heart failure at this time.  Nasopharyngeal COVID-19/influenza PCR performed in the ED today were found to be negative.   Plan: further evaluation and management of presenting acute bilateral pulmonary emboli, as above, including continuation of heparin drip.  Monitor on symmetry.  Monitor continuous pulse oximetry.  Check serum magnesium and phosphorus levels.  As needed albuterol inhaler.  Repeat BMP in the morning.        #) Pancytopenia: In the context of recent chemotherapy, the patient has demonstrated evidence of pancytopenia for last several weeks, with CBC performed today, while showing pancytopenia, is associated with interval improving values as it relates to blood cell count, hemoglobin, and platelet count, as further quantified above.  No evidence of absolute neutropenia at this time.  Appears hemodynamically stable at this time.  Of note, iron studies performed as an outpatient 2 weeks appear consistent with anemia of chronic disease with elevated oxygen saturation percentage.  We will closely trend ensuing  CBC is in the setting of interval initiation of heparin drip as management of newly diagnosed acute bilateral pulmonary emboli, as above.  Presenting INR found to be 1.1.   Plan: Repeat CBC in the morning.      #) Essential hypertension: On losartan as well outpatient and hypertensive agent.  Blood pressures in the ED have been normotensive.  Will closely monitor ensuing blood pressures, particularly in the context of submassive acute bilateral pulmonary emboli, as development of associated hypotension would represent an indication for TPA administration, as further described above.  In this context, will hold home losartan for now.  Plan: Hold losartan for now, as above.  Close monitoring of ensuing blood pressure via routine vital signs.      #) GERD: On esomeprazole as an outpatient. Plan: Continue on PPI.      #) Type 2 diabetes mellitus: None insulin-dependent as an outpatient.  Rather, the patient is on Metformin and glipizide as a sole outpatient oral hypoglycemic agents.  Presented blood sugar to be 178.  Most recent hemoglobin A1c that I encountered via chart review thus far was found to be 7.4% in 2014.  Plan: Repeat hemoglobin A1c.  Hold home Metformin and glipizide during his hospitalization.  Accu-Cheks before every meal and at bedtime with low-dose sliding scale insulin.      #) Generalized anxiety disorder: The patient reports that he is on scheduled Celexa as well as as needed Xanax at home.  He reports recent dose increase regarding the Celexa from 20 mg p.o. daily to 30 mg p.o. daily, following which he has noted increased fatigue.  Consequently, the patient requests a lower dose of Celexa, able MVE 20 mg p.o. daily, be reordered as opposed to the 30 mg dose.  Plan: Celexa 20 mg p.o. daily, as above.  Continue as needed Xanax.     #) Allergic rhinitis: On Flonase as an outpatient.  Plan: Continue home Flonase.       #) Chronic hypoosmolar  hyponatremia: Associated with baseline serum sodium range of 125-132, with presenting CMP reflecting serum sodium within this range.  Of note, recent  TSH performed as an outpatient was found to be within normal limits.  We will add on random urine sodium, urine osmolality to further evaluate the underlying etiology of patient's hyponatremia, as recent diagnosis of acute bilateral pulmonary emboli increase the likelihood of a contribution from SIADH.  Of note, the patient appears relatively euvolemic at this time.  Plan: Monitor strict I's and O's and daily weights.  Add on random urine sodium, urine osmolality.  We will also check serum osmolality to confirm suspected hyperosmolar etiology.  Repeat BMP in the morning.     DVT prophylaxis: Heparin drip, as above Code Status: Full code Family Communication: Patient's case was discussed with his wife, who is present at bedside Disposition Plan: Per Rounding Team Consults called: on-call vascular surgeon, Dr. Delana Meyer, was consulted as further described above Admission status: Inpatient; PCU    Of note, this patient was added by me to the following Admit List/Treatment Team: armcadmits.      PLEASE NOTE THAT DRAGON DICTATION SOFTWARE WAS USED IN THE CONSTRUCTION OF THIS NOTE.   [facility] Pager [phone] From 12PM - 12AM  Otherwise, please contact night-coverage  [email or web address] Password [email or web address]   01/19/2021, 5:00 PM

## 2021-01-19 NOTE — ED Triage Notes (Signed)
Pt to ED via POV, sent to ED by oncologist for known PE. Pt A&O x4, ambulatory without difficulty, respirations even and unlabored.

## 2021-01-19 NOTE — Progress Notes (Signed)
Attempted to connect by video , also called by phone - but patient attempted to connect back at 9:15 am - connection trouble - so Probation officer called by phone again - no response.

## 2021-01-19 NOTE — ED Notes (Signed)
Pt noted to desat into high 80's when asleep. Placed on 2L [place]

## 2021-01-20 ENCOUNTER — Inpatient Hospital Stay: Payer: No Typology Code available for payment source

## 2021-01-20 DIAGNOSIS — I2699 Other pulmonary embolism without acute cor pulmonale: Secondary | ICD-10-CM

## 2021-01-20 LAB — CBC
HCT: 27.7 % — ABNORMAL LOW (ref 39.0–52.0)
Hemoglobin: 9.7 g/dL — ABNORMAL LOW (ref 13.0–17.0)
MCH: 32.6 pg (ref 26.0–34.0)
MCHC: 35 g/dL (ref 30.0–36.0)
MCV: 93 fL (ref 80.0–100.0)
Platelets: 115 10*3/uL — ABNORMAL LOW (ref 150–400)
RBC: 2.98 MIL/uL — ABNORMAL LOW (ref 4.22–5.81)
RDW: 20.4 % — ABNORMAL HIGH (ref 11.5–15.5)
WBC: 2.2 10*3/uL — ABNORMAL LOW (ref 4.0–10.5)
nRBC: 0.9 % — ABNORMAL HIGH (ref 0.0–0.2)

## 2021-01-20 LAB — BASIC METABOLIC PANEL
Anion gap: 8 (ref 5–15)
BUN: 16 mg/dL (ref 8–23)
CO2: 27 mmol/L (ref 22–32)
Calcium: 8.5 mg/dL — ABNORMAL LOW (ref 8.9–10.3)
Chloride: 101 mmol/L (ref 98–111)
Creatinine, Ser: 0.74 mg/dL (ref 0.61–1.24)
GFR, Estimated: >60 mL/min (ref >60)
Glucose, Bld: 159 mg/dL — ABNORMAL HIGH (ref 70–99)
Potassium: 3.7 mmol/L (ref 3.5–5.1)
Sodium: 136 mmol/L (ref 135–145)

## 2021-01-20 LAB — HEPARIN LEVEL (UNFRACTIONATED)
Heparin Unfractionated: 0.57 IU/mL (ref 0.30–0.70)
Heparin Unfractionated: 0.86 IU/mL — ABNORMAL HIGH (ref 0.30–0.70)
Heparin Unfractionated: 1.15 IU/mL — ABNORMAL HIGH (ref 0.30–0.70)

## 2021-01-20 LAB — PHOSPHORUS: Phosphorus: 3.2 mg/dL (ref 2.5–4.6)

## 2021-01-20 LAB — HEMOGLOBIN A1C
Hgb A1c MFr Bld: 8.1 % — ABNORMAL HIGH (ref 4.8–5.6)
Mean Plasma Glucose: 185.77 mg/dL

## 2021-01-20 LAB — MAGNESIUM: Magnesium: 1.9 mg/dL (ref 1.7–2.4)

## 2021-01-20 MED ORDER — HEPARIN (PORCINE) 25000 UT/250ML-% IV SOLN
1100.0000 [IU]/h | INTRAVENOUS | Status: DC
  Administered 2021-01-20: 1250 [IU]/h via INTRAVENOUS
  Administered 2021-01-21 – 2021-01-22 (×2): 1100 [IU]/h via INTRAVENOUS
  Filled 2021-01-20 (×3): qty 250

## 2021-01-20 MED ORDER — ENSURE ENLIVE PO LIQD
237.0000 mL | Freq: Two times a day (BID) | ORAL | Status: DC
  Administered 2021-01-20: 237 mL via ORAL

## 2021-01-20 NOTE — Progress Notes (Signed)
PROGRESS NOTE    Sean Garner.  KGY:[id number] DOB: 09-Nov-1946 DOA: 01/19/2021 PCP: Derinda Late, MD    Brief Narrative:  Sean Garner. is a 75 y.o. 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  He has also undergone a total of 35 rounds of radiation, which she reports represents the totality of the current treatment plan.  His outpatient oncologist is Dr. Mike Gip.   3/5- no overnight issues. Vascular surgery did not feel thrombectomy was indicated  Consultants:   Vascular surgery , hematology  Procedures:   Antimicrobials:       Subjective: No sob, cp, dizziness  Objective: Vitals:   01/20/21 0416 01/20/21 0442 01/20/21 0443 01/20/21 0814  BP: 121/60   133/73  Pulse: (!) 56   (!) 50  Resp: 18   18  Temp: (!) 97.5 F (36.4 C)   97.6 F (36.4 C)  TempSrc: Oral   Oral  SpO2: 99% (!) 88% 95% 99%  Weight:      Height:        Intake/Output Summary (Last 24 hours) at 01/20/2021 0856 Last data filed at 01/20/2021 0724 Gross per 24 hour  Intake 872.72 ml  Output 2350 ml  Net -1477.28 ml   Filed Weights   01/19/21 1427 01/20/21 0415  Weight: 85.8 kg 84.1 kg    Examination:  General exam: Appears calm and comfortable  Respiratory system: Clear to auscultation. Respiratory effort normal. Cardiovascular system: S1 & S2 heard, RRR. No JVD, murmurs, rubs, gallops or clicks.   Gastrointestinal system: Abdomen is nondistended, soft and nontender.  Normal bowel sounds heard. Central nervous system: Alert and oriented. Grossly intact Extremities: trace LE edema RT Skin: warm, dry Psychiatry: Judgement and insight appear normal. Mood & affect appropriate.     Data Reviewed: I have personally reviewed following labs and imaging studies  CBC: Recent Labs  Lab 01/16/21 0805 01/19/21 1459 01/20/21 0409  WBC 2.4* 3.3* 2.2*  NEUTROABS 1.8 2.4  --   HGB 10.6* 11.2* 9.7*  HCT 32.3* 32.2* 27.7*  MCV 92.3 92.3 93.0  PLT 102* 126* 026*   Basic Metabolic Panel: Recent Labs  Lab 01/16/21 0805 01/19/21 1459 01/19/21 1825 01/20/21 0409  NA 135 131*  --  136  K 3.4* 3.6  --  3.7  CL 96* 96*  --  101  CO2 28 28  --  27  GLUCOSE 157* 178*  --  159*  BUN 29* 28*  --  16  CREATININE 0.90 0.81  --  0.74  CALCIUM 9.3 9.0  --  8.5*  MG 1.6*  --  2.0 1.9  PHOS  --   --   --  3.2   GFR: Estimated Creatinine Clearance: 83.6 mL/min (by C-G formula based on SCr of 0.74 mg/dL). Liver Function Tests: Recent Labs  Lab 01/19/21 1459  AST 18  ALT 25  ALKPHOS 46  BILITOT 1.1  PROT 6.4*  ALBUMIN 4.1   No results for input(s): LIPASE, AMYLASE in the  last 168 hours. No results for input(s): AMMONIA in the last 168 hours. Coagulation Profile: Recent Labs  Lab 01/19/21 1459  INR 1.1   Cardiac Enzymes: No results for input(s): CKTOTAL, CKMB, CKMBINDEX, TROPONINI in the last 168 hours. BNP (last 3 results) No results for input(s): PROBNP in the last 8760 hours. HbA1C: No results for input(s): HGBA1C in the last 72 hours. CBG: Recent Labs  Lab 01/19/21 1827 01/19/21 2120  GLUCAP 209* 196*   Lipid Profile: No results for input(s): CHOL, HDL, LDLCALC, TRIG, CHOLHDL, LDLDIRECT in the last 72 hours. Thyroid Function Tests: No results for input(s): TSH, T4TOTAL, FREET4, T3FREE, THYROIDAB in the last 72 hours. Anemia Panel: No results for input(s):  VITAMINB12, FOLATE, FERRITIN, TIBC, IRON, RETICCTPCT in the last 72 hours. Sepsis Labs: Recent Labs  Lab 01/19/21 1459  LATICACIDVEN 1.2    Recent Results (from the past 240 hour(s))  Resp Panel by RT-PCR (Flu A&B, Covid) Nasopharyngeal Swab     Status: None   Collection Time: 01/19/21  2:59 PM   Specimen: Nasopharyngeal Swab; Nasopharyngeal(NP) swabs in vial transport medium  Result Value Ref Range Status   SARS Coronavirus 2 by RT PCR NEGATIVE NEGATIVE Final    Comment: (NOTE) SARS-CoV-2 target nucleic acids are NOT DETECTED.  The SARS-CoV-2 RNA is generally detectable in upper respiratory specimens during the acute phase of infection. The lowest concentration of SARS-CoV-2 viral copies this assay can detect is 138 copies/mL. A negative result does not preclude SARS-Cov-2 infection and should not be used as the sole basis for treatment or other patient management decisions. A negative result may occur with  improper specimen collection/handling, submission of specimen other than nasopharyngeal swab, presence of viral mutation(s) within the areas targeted by this assay, and inadequate number of viral copies(<138 copies/mL). A negative result must be combined with clinical observations, patient history, and epidemiological information. The expected result is Negative.  Fact Sheet for Patients:  EntrepreneurPulse.com.au  Fact Sheet for Healthcare Providers:  IncredibleEmployment.be  This test is no t yet approved or cleared by the [place] FDA and  has been authorized for detection and/or diagnosis of SARS-CoV-2 by FDA under an Emergency Use Authorization (EUA). This EUA will remain  in effect (meaning this test can be used) for the duration of the COVID-19 declaration under Section 564(b)(1) of the Act, 21 U.S.C.section 360bbb-3(b)(1), unless the authorization is terminated  or revoked sooner.       Influenza A by PCR NEGATIVE  NEGATIVE Final   Influenza B by PCR NEGATIVE NEGATIVE Final    Comment: (NOTE) The Xpert Xpress SARS-CoV-2/FLU/RSV plus assay is intended as an aid in the diagnosis of influenza from Nasopharyngeal swab specimens and should not be used as a sole basis for treatment. Nasal washings and aspirates are unacceptable for Xpert Xpress SARS-CoV-2/FLU/RSV testing.  Fact Sheet for Patients: EntrepreneurPulse.com.au  Fact Sheet for Healthcare Providers: IncredibleEmployment.be  This test is not yet approved or cleared by the [place] FDA and has been authorized for detection and/or diagnosis of SARS-CoV-2 by FDA under an Emergency Use Authorization (EUA). This EUA will remain in effect (meaning this test can be used) for the duration of the COVID-19 declaration under Section 564(b)(1) of the Act, 21 U.S.C. section 360bbb-3(b)(1), unless the authorization is terminated or revoked.  Performed at [facility], [address], [place], [place] [address]          Radiology Studies: CT CHEST ABDOMEN PELVIS W CONTRAST  Result Date: 01/19/2021 CLINICAL  DATA:  Restaging post treatment for base of tongue carcinoma. Distal esophageal carcinoma subsequently diagnosed. Chemotherapy and radiation therapy in January. Shortness of breath on exertion. EXAM: CT CHEST, ABDOMEN, AND PELVIS WITH CONTRAST TECHNIQUE: Multidetector CT imaging of the chest, abdomen and pelvis was performed following the standard protocol during bolus administration of intravenous contrast. CONTRAST:  171mL OMNIPAQUE IOHEXOL 300 MG/ML  SOLN COMPARISON:  PET-CT 10/25/2020.  Abdominopelvic CT 09/24/2013. FINDINGS: CT CHEST FINDINGS Cardiovascular: The pulmonary arteries are well opacified with contrast. There is extensive nearly occlusive pulmonary thromboembolic disease bilaterally with involvement of the main and lobar right and left pulmonary arteries. Thrombus in the left pulmonary  artery measures up to 4.4 cm on image 30/2. There is evidence of right ventricular strain with the right ventricular lumen measuring 4.7 cm and the left ventricular lumen measuring 3.7 cm. Mild atherosclerosis of the aorta, great vessels and coronary arteries. Right IJ Port-A-Cath extends to the superior cavoatrial junction. Overall heart size is normal, and there is no pericardial fluid. Mediastinum/Nodes: There are no enlarged mediastinal, hilar or axillary lymph nodes. Small hiatal hernia with mild distal esophageal wall thickening, but no apparent focal mass lesion. The trachea and thyroid gland appear unremarkable. Lungs/Pleura: No pleural effusion or pneumothorax. Mild centrilobular emphysema. No evidence of pulmonary infarct, infiltrate or suspicious nodule. Musculoskeletal/Chest wall: No chest wall mass or suspicious osseous findings. CT ABDOMEN AND PELVIS FINDINGS Hepatobiliary: 4 mm low-density lesion in the left lobe on image 57/2 is similar to remote abdominal CT and appears benign. No new or enlarging hepatic lesions. No significant biliary dilatation post cholecystectomy. Stable pneumobilia attributed to prior sphincterotomy. Pancreas: Unremarkable. No pancreatic ductal dilatation or surrounding inflammatory changes. Spleen: Normal in size without focal abnormality. Adrenals/Urinary Tract: Both adrenal glands appear normal. The kidneys appear normal without evidence of urinary tract calculus, suspicious lesion or hydronephrosis. No bladder abnormalities are seen. Stomach/Bowel: As above, distal esophageal wall thickening without apparent focal mass lesion. There is a small hiatal hernia. The proximal stomach is incompletely distended. The small bowel, appendix and colon demonstrate no significant findings. There are mild sigmoid colon diverticular changes. Vascular/Lymphatic: There are no enlarged abdominal or pelvic lymph nodes. Mild aortic and branch vessel atherosclerosis without acute vascular  findings. The portal, superior mesenteric and splenic veins are patent. Reproductive: Mild enlargement of the prostate gland. Other: Stable asymmetric prominent fat in the right inguinal canal. Postsurgical changes in the anterior abdominal wall with stable areas of laxity containing fat. No ascites or peritoneal nodularity. Musculoskeletal: No acute or significant osseous findings. IMPRESSION: 1. Extensive nearly occlusive pulmonary thromboembolic disease bilaterally with CT evidence of right heart strain consistent with at least submassive (intermediate risk) PE. The presence of right heart strain has been associated with an increased risk of morbidity and mortality. Please refer to the "PE Focused" order set in EPIC. 2. No evidence of metastatic disease within the chest, abdomen or pelvis. Improvement in previously demonstrated distal esophageal wall thickening without apparent focal mass lesion. 3. Aortic Atherosclerosis (ICD10-I70.0) and Emphysema (ICD10-J43.9). 4. Critical Value/emergent results were called by telephone at the time of interpretation on 01/19/2021 at 2:08 pm to provider [facility] , who verbally acknowledged these results. Electronically Signed   By: Richardean Sale M.D.   On: 01/19/2021 14:19        Scheduled Meds: . brimonidine  1 drop Left Eye Daily   And  . timolol  1 drop Left Eye Daily  . citalopram  20 mg Oral Daily  .  fluticasone  2 spray Each Nare Daily  . insulin aspart  0-9 Units Subcutaneous TID WC  . pantoprazole  40 mg Oral Daily  . simvastatin  40 mg Oral q1800   Continuous Infusions: . heparin 1,250 Units/hr (01/20/21 0724)  . lactated ringers 75 mL/hr at 01/20/21 0413    Assessment & Plan:   Principal Problem:   Pulmonary embolism, bilateral (HCC) Active Problems:   Benign essential hypertension   GERD (gastroesophageal reflux disease)   Type 2 diabetes mellitus without complications (HCC)   Anxiety   Hyponatremia   SOB (shortness of  breath)   Elevated troponin   Pancytopenia (HCC)   Desiree Lucy Demba Nigh. is a 75 y.o. 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   #) Acute bilateral pulmonary emboli: In the setting of 1 week of shortness of breath, outpatient CT chest showed evidence of extensive bilateral pulmonary emboli with CT evidence of right heart strain consistent with submassive pulmonary emboli. No evidence of associated hypotension to warrant consideration for TPA administration at this time. Appears to be patient's first PE/DVT.  Appears to be provoked in the setting of the presence of hypercoagulable state on the basis of squamous cell cancer of the tongue followed by distal esophageal carcinoma, without additional DVT/PE risk factors identified at this time. 3/5-will obtain venous US b/l -POSITIVE for occlusive DVT in the right deep femoral vein Spoke to pt's hematologist Dr. Mike Gip Will continue with heparin gtt x48hrs , then when ok by hematology will transition to po Eliquis Will f/u vascular- at this time no plan for thrombectomy.  Will continue monitoring hemodynamics. Continue ivf       #) Elevated troponin:mildly elevated. Likely demand ischemia. Chest pain free Trended down Will ck echo, as ct showed rv strain      #) Acute hypoxic respiratory distress: in the context of no known baseline supplemental oxygen requirements, presenting O2 sat noted to be 88% on room air, which is improved to 100%  on 2 L nasal cannula Likely from PE. Keep 02 sat >92% with supplemental 02    #) Pancytopenia: In the context of recent chemotherapy Monitor cbc while on heparin gtt Hematology is consulted     #) Essential hypertension: On losartan as outpatient Hold as pt's bp stable     #) GERD:  Continue PPI      #) Type 2 diabetes mellitus: None insulin-dependent as an outpatient.  Rather, the patient is on Metformin and glipizide  F/u A1c Ck FS RISS     #) Generalized anxiety disorder:  Continue celexa     #) Allergic rhinitis: On Flonase as an outpatient.      #)hyponatremia: Associated with baseline serum sodium range of 125-132 More hypovolemic Improved with ivf, Na 131>>136     DVT prophylaxis: Heparin drip Code Status: Full Family Communication: Updated wife  Status is: Inpatient  Remains inpatient appropriate because:IV treatments appropriate due to intensity of illness or inability to take PO   Dispo: The patient is from: Home  Anticipated d/c is to: Home              Patient currently is not medically stable to d/c.   Difficult to place patient No               Anticipation of discharge :2-3  days           LOS: 1 day   Time spent: 35 minutes with more than 50% on [place], MD [facility] Pager [phone]  If 7PM-7AM, please contact night-coverage 01/20/2021, 8:56 AM

## 2021-01-20 NOTE — Progress Notes (Signed)
ANTICOAGULATION CONSULT NOTE - Initial Consult  Pharmacy Consult for Heparin drip Indication: pulmonary embolus  No Known Allergies  Patient Measurements: Height: 5\' 10"  (177.8 cm) Weight: 85.8 kg (189 lb 2.5 oz) IBW/kg (Calculated) : 73 Heparin Dosing Weight: 86  Vital Signs: Temp: 98.3 F (36.8 C) (03/04 2308) Temp Source: Oral (03/04 2308) BP: 132/67 (03/04 2308) Pulse Rate: 57 (03/04 2308)  Labs: Recent Labs    01/19/21 1459 01/19/21 1825 01/19/21 2348  HGB 11.2*  --   --   HCT 32.2*  --   --   PLT 126*  --   --   APTT 26  --   --   LABPROT 14.1  --   --   INR 1.1  --   --   HEPARINUNFRC  --   --  1.15*  CREATININE 0.81  --   --   TROPONINIHS 23* 21*  --     Estimated Creatinine Clearance: 82.6 mL/min (by C-G formula based on SCr of 0.81 mg/dL).   Medical History: Past Medical History:  Diagnosis Date  . Anxiety   . 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 Cataract    lt eye repair; present in rt eye but not ripe yet.  . Claustrophobia   . Diabetes mellitus without complication ([place])   . GERD (gastroesophageal reflux disease)   . Glaucoma    using gtts for this.  . Hypercholesterolemia   . Hypertension     Medications:  Scheduled:  . brimonidine  1 drop Left Eye Daily   And  . timolol  1 drop Left Eye Daily  . citalopram  20 mg Oral Daily  . fluticasone  2 spray Each Nare Daily  . insulin aspart  0-9 Units Subcutaneous TID WC  . pantoprazole  40 mg Oral Daily  . simvastatin  40 mg Oral q1800   Infusions:  . heparin    . lactated ringers 75 mL/hr at 01/20/21 0104    Assessment: 75 yo M to start Heparin drip for PE. No anticoag PTA  per Med Rec Hgb 11.2  plt 126  INR 1.1  APTT 26  Goal of Therapy:  Heparin level 0.3-0.7 units/ml Monitor platelets by anticoagulation protocol: Yes   Plan:  3/4:  HL @ 3536 = 1.15 Phlebotomist confirmed level was drawn from opposite arm that heparin was infusing so this level is  valid. Will hold heparin drip for 1 hr and restart @ 1250 units/hr. Will recheck HL 8 hrs after restart.   Ezeriah Luty D 01/20/2021,1:08 AM

## 2021-01-20 NOTE — Progress Notes (Signed)
ANTICOAGULATION CONSULT NOTE  Pharmacy Consult for Heparin drip Indication: pulmonary embolus  No Known Allergies  Patient Measurements: Height: 5\' 10"  (177.8 cm) Weight: 84.1 kg (185 lb 4.8 oz) IBW/kg (Calculated) : 73 Heparin Dosing Weight: 86  Vital Signs: Temp: 98.4 F (36.9 C) (03/05 1938) Temp Source: Oral (03/05 1938) BP: 120/63 (03/05 1938) Pulse Rate: 70 (03/05 1938)  Labs: Recent Labs    01/19/21 1459 01/19/21 1825 01/19/21 2348 01/20/21 0409 01/20/21 0947 01/20/21 2033  HGB 11.2*  --   --  9.7*  --   --   HCT 32.2*  --   --  27.7*  --   --   PLT 126*  --   --  115*  --   --   APTT 26  --   --   --   --   --   LABPROT 14.1  --   --   --   --   --   INR 1.1  --   --   --   --   --   HEPARINUNFRC  --   --  1.15*  --  0.86* 0.57  CREATININE 0.81  --   --  0.74  --   --   TROPONINIHS 23* 21*  --   --   --   --     Estimated Creatinine Clearance: 83.6 mL/min (by C-G formula based on SCr of 0.74 mg/dL).   Medical History: Past Medical History:  Diagnosis Date  . Anxiety   . 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 Cataract    lt eye repair; present in rt eye but not ripe yet.  . Claustrophobia   . Diabetes mellitus without complication ([place])   . GERD (gastroesophageal reflux disease)   . Glaucoma    using gtts for this.  . Hypercholesterolemia   . Hypertension     Medications:  Scheduled:  . brimonidine  1 drop Left Eye Daily   And  . timolol  1 drop Left Eye Daily  . citalopram  20 mg Oral Daily  . feeding supplement  237 mL Oral BID BM  . fluticasone  2 spray Each Nare Daily  . insulin aspart  0-9 Units Subcutaneous TID WC  . pantoprazole  40 mg Oral Daily  . simvastatin  40 mg Oral q1800   Infusions:  . heparin 1,100 Units/hr (01/20/21 1308)  . lactated ringers 75 mL/hr at 01/20/21 1727    Assessment: 75 yo M to start Heparin drip for PE. No anticoag PTA  per Med Rec Hgb 11.2>9.7  plt 126>115  INR 1.1  APTT  26  3/4:  HL @ 2694 = 1.15 Phlebotomist confirmed level was drawn from opposite arm that heparin was infusing so this level is valid. Will hold heparin drip for 1 hr and restart @ 1250 units/hr. 3/5:  HL @ 0947 = 0.86 (resulted at 1205), Supratherapeutic. Will decrease drip rate to 1100 units/hr. Will recheck HL 8 hrst.   Goal of Therapy:  Heparin level 0.3-0.7 units/ml Monitor platelets by anticoagulation protocol: Yes   Plan:  3/5:  HL @ 2033 = 0.57 therapeutic. Continue heparin infusion at 1100 units/hr. Will recheck HL 8 hrs  Monitor daily CBC and s/s of bleed  Darnelle Bos, PharmD 01/20/2021,9:37 PM

## 2021-01-20 NOTE — Consult Note (Addendum)
[facility]  Date of admission:  01/19/2021  Inpatient day:  01/20/2021  Consulting physician: Dr Nolberto Hanlon   Reason for Consultation:  Bilateral pulmonary emboli  Chief Complaint: Sean Garner. is a 74 y.o. 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  During his work-up, PET scan revealed focal hypermetabolism in the distal esophagus which was biopsy positive for esophageal carcinoma.  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  He received 6 weeks of cisplatin and concurrent radiation.  Chemotherapy completed on 12/27/2020.  Radiation completed on 01/11/2021.  He did not receive week 7 cisplatin secondary to myelosuppression.  In anticipation of esophagectomy versus endoscopic resection followed by ablation, he underwent restaging CT scans yesterday.  Chest, abdomen and pelvis CT revealed extensive nearly occlusive pulmonary thromboembolic disease bilaterally with CT evidence of right heart strain consistent with at least a submassive PE.  There is no evidence of metastatic disease in the chest, abdomen and pelvis.  There was improvement in the distal esophageal wall thickening without apparent focal lesion.  He was contacted and directed to the emergency room.  He notes about a week history of some mild shortness of breath.  He denies any pleuritic chest pain or hemoptysis.  He was started on heparin.  He feels good today.  Bilateral lower extremity duplex today revealed occlusive hypoechoic thrombus in the deep right femoral vein extending to the junction with the femoral vein.  The common femoral, femoral and popliteal veins remain patent.  Calf veins remain patent.   Past Medical History:  Diagnosis  Date  . Anxiety   . Cancer [facility])    Head/throat Cancer at the base of the tongue  . Cataract    lt eye repair; present in rt eye but not ripe yet.  . Claustrophobia   . Diabetes mellitus without complication ([place])   . GERD (gastroesophageal reflux disease)   . Glaucoma    using gtts for this.  . Hypercholesterolemia   . Hypertension     Past Surgical History:  Procedure Laterality Date  . CATARACT EXTRACTION W/PHACO Left 12/03/2017   Procedure: CATARACT EXTRACTION PHACO AND INTRAOCULAR LENS PLACEMENT ([place]) COMPLICATED DIABETIC LEFT;  Surgeon: Leandrew Koyanagi, MD;  Location: [place];  Service: Ophthalmology;  Laterality: Left;  Diabetic - oral meds  . CHOLECYSTECTOMY    . COLONOSCOPY  2016  . PORTA CATH INSERTION N/A 11/13/2020   Procedure: PORTA CATH INSERTION;  Surgeon: Algernon Huxley, MD;  Location: [place] [facility];  Service: Cardiovascular;  Laterality: N/A;    Family History  Problem Relation Age of Onset  . Cancer Mother   . Colon cancer Neg Hx   . Colon polyps Neg Hx   . Esophageal cancer Neg Hx   . Rectal cancer Neg Hx   . Stomach cancer Neg Hx     Social History:  reports that he quit smoking about 36 years ago. He has never used smokeless tobacco. He reports previous alcohol use. He reports that he does not use drugs.  The patient denies any exposure to radiation or toxins.  He drinks a glass of wine or a Margarita occasionally. 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 He was exposed to [employer].  He worked with a Geologist, engineering for 34 years.  His wife's name is Enid Derry.  The patient lives in [place].  He is alone today.  Allergies: No Known Allergies  Facility-Administered Medications Prior to Admission  Medication Dose Route Frequency Provider Last Rate Last Admin  . 0.9 %  sodium chloride infusion  500 mL Intravenous Once Pyrtle, Lajuan Lines, MD       Medications Prior to Admission  Medication Sig Dispense Refill  .  ALPRAZolam (XANAX) 0.25 MG tablet Take 1 tablet (0.25 mg total) by mouth at bedtime as needed for anxiety. 60 tablet 0  . brimonidine-timolol (COMBIGAN) 0.2-0.5 % ophthalmic solution Place 1 drop into the left eye daily.    . calcium carbonate (OSCAL) 1500 (600 Ca) MG TABS tablet Take by mouth 2 (two) times daily with a meal.    . citalopram (CELEXA) 20 MG tablet Take 1.5 tablets (30 mg total) by mouth daily with breakfast. 45 tablet 0  . dexamethasone (DECADRON) 4 MG tablet 2 TABS BY MOUTH DAILY. TAKE DAILY X 3 DAYS STARTING THE DAY AFTER CHEMOTHERAPY. TAKE WITH FOOD. 30 tablet 1  . dexamethasone (DECADRON) 4 MG tablet Take 1 tablet (4 mg total) by mouth 2 (two) times daily with a meal. 60 tablet 0  . esomeprazole (NEXIUM) 20 MG capsule Take 40 mg by mouth daily at 12 noon.    . fluticasone (FLONASE) 50 MCG/ACT nasal spray Place into both nostrils.    Marland Kitchen glipiZIDE (GLUCOTROL XL) 5 MG 24 hr tablet Take 5 mg by mouth 2 (two) times daily.  1  . magic mouthwash w/lidocaine SOLN Take 5 mLs by mouth.    Marland Kitchen VITAMIN D PO Take 800 Units by mouth daily.    Marland Kitchen glucose blood test strip     . lidocaine-prilocaine (EMLA) cream Apply to affected area once 30 g 3  . losartan (COZAAR) 100 MG tablet Take 100 mg by mouth daily. (Patient not taking: No sig reported)    . metFORMIN (GLUCOPHAGE-XR) 500 MG 24 hr tablet Take 500 mg by mouth 2 (two) times daily. Takes 2 tablets and 1 tablet in the pm    . ondansetron (ZOFRAN) 8 MG tablet Take 1 tablet (8 mg total) by mouth 2 (two) times daily as needed. Start on the third day after cisplatin chemotherapy. (Patient not taking: No sig reported) 30 tablet 1  . prochlorperazine (COMPAZINE) 10 MG tablet Take 1 tablet (10 mg total) by mouth every 6 (six) hours as needed (Nausea or vomiting). (Patient not taking: No sig reported) 30 tablet 1  . simvastatin (ZOCOR) 40 MG tablet Take 40 mg by mouth daily at 6 PM.    . sucralfate (CARAFATE) 1 g tablet Take 0.5 tablets (0.5 g total) by  mouth 3 (three) times daily. Dissolve in warm water, swish and swallow (Patient not taking: No sig reported) 60 tablet 1  . traZODone (DESYREL) 50 MG tablet Take 0.5-1 tablets (25-50 mg total) by mouth at bedtime as needed for sleep. 30 tablet 2    Review of Systems: GENERAL:  Feels good.  No fevers, sweats.  Some weight loss associated with treatment. PERFORMANCE STATUS (ECOG):  1 HEENT:  No visual changes, runny nose, sore throat, mouth sores or tenderness. Lungs: Mild shortness of breath with exertion.  No cough.  No hemoptysis. Cardiac:  No chest pain, palpitations, orthopnea, or PND. GI:  No nausea, vomiting, diarrhea, constipation, melena or hematochezia. GU:  No urgency, frequency, dysuria, or hematuria. Musculoskeletal:  No back pain.  No joint pain.  No muscle tenderness. Extremities:  Slight right lower extremity asymmetry.  No pain. Skin:  No rashes or skin changes. Neuro:  No headache, numbness or weakness, balance or coordination issues. Endocrine:  No diabetes, thyroid issues, hot flashes or night sweats. Psych:  On citalopram and Xanax for anxiety.  No mood changes. Pain:  No focal pain. Review of systems:  All other systems reviewed and found to be negative.  Physical Exam:  Blood pressure 136/64, pulse 65, temperature 97.9 F (36.6 C), temperature source Oral, resp. rate 18, height 5\' 10"  (1.778 m), weight 185 lb 4.8 oz (84.1 kg), SpO2 95 %.  GENERAL:  Well developed, well nourished, gentleman sitting comfortably on the medical unit in no acute distress. MENTAL STATUS:  Alert and oriented to person, place and time. HEAD:  Pearline Cables hair.  Normocephalic, atraumatic, face symmetric, no Cushingoid features. EYES:  Blue eyes.  Pupils equal round and reactive to light and accomodation.  No conjunctivitis or scleral icterus. ENT:  Oropharynx clear without lesion.  Tongue normal. Mucous membranes moist.  RESPIRATORY:  Clear to auscultation without rales, wheezes or  rhonchi. CARDIOVASCULAR:  Regular rate and rhythm without murmur, rub or gallop. ABDOMEN:  Soft, non-tender, with active bowel sounds, and no hepatosplenomegaly.  No masses. SKIN:  No rashes, ulcers or lesions. EXTREMITIES: Right lower extremity with subtle edema.  No skin discoloration or tenderness.  No palpable cords. No Homan's sign. LYMPH NODES: Right sided upper cervical fingertip node.  No palpable supraclavicular, axillary or inguinal adenopathy  NEUROLOGICAL: Unremarkable. PSYCH:  Appropriate.   Results for orders placed or performed during the hospital encounter of 01/19/21 (from the past 48 hour(s))  CBC with Differential     Status: Abnormal   Collection Time: 01/19/21  2:59 PM  Result Value Ref Range   WBC 3.3 (L) 4.0 - 10.5 K/uL   RBC 3.49 (L) 4.22 - 5.81 MIL/uL   Hemoglobin 11.2 (L) 13.0 - 17.0 g/dL   HCT 32.2 (L) 39.0 - 52.0 %   MCV 92.3 80.0 - 100.0 fL   MCH 32.1 26.0 - 34.0 pg   MCHC 34.8 30.0 - 36.0 g/dL   RDW 20.0 (H) 11.5 - 15.5 %   Platelets 126 (L) 150 - 400 K/uL    Comment: Immature Platelet Fraction may be clinically indicated, consider ordering this additional test [id number]    nRBC 0.0 0.0 - 0.2 %   Neutrophils Relative % 75 %   Neutro Abs 2.4 1.7 - 7.7 K/uL   Lymphocytes Relative 11 %   Lymphs Abs 0.4 (L) 0.7 - 4.0 K/uL   Monocytes Relative 9 %   Monocytes Absolute 0.3 0.1 - 1.0 K/uL   Eosinophils Relative 0 %   Eosinophils Absolute 0.0 0.0 - 0.5 K/uL   Basophils Relative 0 %   Basophils Absolute 0.0 0.0 - 0.1 K/uL   Immature Granulocytes 5 %   Abs Immature Granulocytes 0.15 (H) 0.00 - 0.07 K/uL    Comment: Performed at [facility], [address] [place], [place] [address]  Comprehensive metabolic panel     Status: Abnormal   Collection Time: 01/19/21  2:59 PM  Result Value Ref Range   Sodium 131 (L) 135 - 145 mmol/L   Potassium 3.6 3.5 - 5.1 mmol/L   Chloride 96 (L) 98 - 111 mmol/L   CO2 28 22 - 32 mmol/L   Glucose, Bld 178 (H)  70 - 99 mg/dL  Comment: Glucose reference range applies only to samples taken after fasting for at least 8 hours.   BUN 28 (H) 8 - 23 mg/dL   Creatinine, Ser 0.81 0.61 - 1.24 mg/dL   Calcium 9.0 8.9 - 10.3 mg/dL   Total Protein 6.4 (L) 6.5 - 8.1 g/dL   Albumin 4.1 3.5 - 5.0 g/dL   AST 18 15 - 41 U/L   ALT 25 0 - 44 U/L   Alkaline Phosphatase 46 38 - 126 U/L   Total Bilirubin 1.1 0.3 - 1.2 mg/dL   GFR, Estimated >60 >60 mL/min    Comment: (NOTE) Calculated using the CKD-EPI Creatinine Equation (2021)    Anion gap 7 5 - 15    Comment: Performed at [facility], [place], [place] [address]  Troponin I (High Sensitivity)     Status: Abnormal   Collection Time: 01/19/21  2:59 PM  Result Value Ref Range   Troponin I (High Sensitivity) 23 (H) <18 ng/L    Comment: (NOTE) Elevated high sensitivity troponin I (hsTnI) values and significant  changes across serial measurements may suggest ACS but many other  chronic and acute conditions are known to elevate hsTnI results.  Refer to the "Links" section for chest pain algorithms and additional  guidance. Performed at [facility], [address] [place], [place] [address]   Lactic acid, plasma     Status: None   Collection Time: 01/19/21  2:59 PM  Result Value Ref Range   Lactic Acid, Venous 1.2 0.5 - 1.9 mmol/L    Comment: Performed at [facility], [address] [place], [place] [address]  Protime-INR     Status: None   Collection Time: 01/19/21  2:59 PM  Result Value Ref Range   Prothrombin Time 14.1 11.4 - 15.2 seconds   INR 1.1 0.8 - 1.2    Comment: (NOTE) INR goal varies based on device and disease states. Performed at [facility], [address] [place], [place] [address]   APTT     Status: None   Collection Time: 01/19/21  2:59 PM  Result Value Ref Range   aPTT 26 24 - 36 seconds    Comment: Performed at [facility], [address] [place],  [place] [address]  Brain natriuretic peptide     Status: Abnormal   Collection Time: 01/19/21  2:59 PM  Result Value Ref Range   B Natriuretic Peptide 221.1 (H) 0.0 - 100.0 pg/mL    Comment: Performed at [facility], [address] [place], [place] [address]  Resp Panel by RT-PCR (Flu A&B, Covid) Nasopharyngeal Swab     Status: None   Collection Time: 01/19/21  2:59 PM   Specimen: Nasopharyngeal Swab; Nasopharyngeal(NP) swabs in vial transport medium  Result Value Ref Range   SARS Coronavirus 2 by RT PCR NEGATIVE NEGATIVE    Comment: (NOTE) SARS-CoV-2 target nucleic acids are NOT DETECTED.  The SARS-CoV-2 RNA is generally detectable in upper respiratory specimens during the acute phase of infection. The lowest concentration of SARS-CoV-2 viral copies this assay can detect is 138 copies/mL. A negative result does not preclude SARS-Cov-2 infection and should not be used as the sole basis for treatment or other patient management decisions. A negative result may occur with  improper specimen collection/handling, submission of specimen other than nasopharyngeal swab, presence of viral mutation(s) within the areas targeted by this assay, and inadequate number of viral copies(<138 copies/mL). A negative result must  be combined with clinical observations, patient history, and epidemiological information. The expected result is Negative.  Fact Sheet for Patients:  EntrepreneurPulse.com.au  Fact Sheet for Healthcare Providers:  IncredibleEmployment.be  This test is no t yet approved or cleared by the [place] FDA and  has been authorized for detection and/or diagnosis of SARS-CoV-2 by FDA under an Emergency Use Authorization (EUA). This EUA will remain  in effect (meaning this test can be used) for the duration of the COVID-19 declaration under Section 564(b)(1) of the Act, 21 U.S.C.section 360bbb-3(b)(1), unless the authorization is terminated   or revoked sooner.       Influenza A by PCR NEGATIVE NEGATIVE   Influenza B by PCR NEGATIVE NEGATIVE    Comment: (NOTE) The Xpert Xpress SARS-CoV-2/FLU/RSV plus assay is intended as an aid in the diagnosis of influenza from Nasopharyngeal swab specimens and should not be used as a sole basis for treatment. Nasal washings and aspirates are unacceptable for Xpert Xpress SARS-CoV-2/FLU/RSV testing.  Fact Sheet for Patients: EntrepreneurPulse.com.au  Fact Sheet for Healthcare Providers: IncredibleEmployment.be  This test is not yet approved or cleared by the [place] FDA and has been authorized for detection and/or diagnosis of SARS-CoV-2 by FDA under an Emergency Use Authorization (EUA). This EUA will remain in effect (meaning this test can be used) for the duration of the COVID-19 declaration under Section 564(b)(1) of the Act, 21 U.S.C. section 360bbb-3(b)(1), unless the authorization is terminated or revoked.  Performed at [facility], [place], [place] [address]   Troponin I (High Sensitivity)     Status: Abnormal   Collection Time: 01/19/21  6:25 PM  Result Value Ref Range   Troponin I (High Sensitivity) 21 (H) <18 ng/L    Comment: (NOTE) Elevated high sensitivity troponin I (hsTnI) values and significant  changes across serial measurements may suggest ACS but many other  chronic and acute conditions are known to elevate hsTnI results.  Refer to the "Links" section for chest pain algorithms and additional  guidance. Performed at [facility], [address] [place], [place] [address]   Magnesium     Status: None   Collection Time: 01/19/21  6:25 PM  Result Value Ref Range   Magnesium 2.0 1.7 - 2.4 mg/dL    Comment: Performed at [facility], [address] [place], [place] [address]  CBG monitoring, ED     Status: Abnormal   Collection Time: 01/19/21  6:27 PM  Result Value Ref  Range   Glucose-Capillary 209 (H) 70 - 99 mg/dL    Comment: Glucose reference range applies only to samples taken after fasting for at least 8 hours.  Sodium, urine, random     Status: None   Collection Time: 01/19/21  7:33 PM  Result Value Ref Range   Sodium, Ur 99 mmol/L    Comment: Performed at [facility], [address] [place], [place] [address]  Osmolality, urine     Status: None   Collection Time: 01/19/21  7:33 PM  Result Value Ref Range   Osmolality, Ur 572 300 - 900 mOsm/kg    Comment: Performed at [facility], [address] [place], [place] [address]  Osmolality     Status: None   Collection Time: 01/19/21  7:34 PM  Result Value Ref Range   Osmolality 291 275 - 295 mOsm/kg    Comment: Performed at [facility], [address], [place], [place] [address]  CBG monitoring, ED  Status: Abnormal   Collection Time: 01/19/21  9:20 PM  Result Value Ref Range   Glucose-Capillary 196 (H) 70 - 99 mg/dL    Comment: Glucose reference range applies only to samples taken after fasting for at least 8 hours.  Heparin level (unfractionated)     Status: Abnormal   Collection Time: 01/19/21 11:48 PM  Result Value Ref Range   Heparin Unfractionated 1.15 (H) 0.30 - 0.70 IU/mL    Comment: (NOTE) If heparin results are below expected values, and patient dosage has  been confirmed, suggest follow up testing of antithrombin III levels. Performed at [facility], [address] [place], [place] [address]   Hemoglobin A1c     Status: Abnormal   Collection Time: 01/20/21  4:09 AM  Result Value Ref Range   Hgb A1c MFr Bld 8.1 (H) 4.8 - 5.6 %    Comment: (NOTE) Pre diabetes:          5.7%-6.4%  Diabetes:              >6.4%  Glycemic control for   <7.0% adults with diabetes    Mean Plasma Glucose 185.77 mg/dL    Comment: Performed at [facility] [address] [place], [place] [address]  Phosphorus     Status: None   Collection  Time: 01/20/21  4:09 AM  Result Value Ref Range   Phosphorus 3.2 2.5 - 4.6 mg/dL    Comment: Performed at [facility], [address] [place], [place] [address]  Magnesium     Status: None   Collection Time: 01/20/21  4:09 AM  Result Value Ref Range   Magnesium 1.9 1.7 - 2.4 mg/dL    Comment: Performed at [facility], [address] [place], [place] [address]  Basic metabolic panel     Status: Abnormal   Collection Time: 01/20/21  4:09 AM  Result Value Ref Range   Sodium 136 135 - 145 mmol/L   Potassium 3.7 3.5 - 5.1 mmol/L   Chloride 101 98 - 111 mmol/L   CO2 27 22 - 32 mmol/L   Glucose, Bld 159 (H) 70 - 99 mg/dL    Comment: Glucose reference range applies only to samples taken after fasting for at least 8 hours.   BUN 16 8 - 23 mg/dL   Creatinine, Ser 0.74 0.61 - 1.24 mg/dL   Calcium 8.5 (L) 8.9 - 10.3 mg/dL   GFR, Estimated >60 >60 mL/min    Comment: (NOTE) Calculated using the CKD-EPI Creatinine Equation (2021)    Anion gap 8 5 - 15    Comment: Performed at [facility], [address] [place],  [address]  CBC     Status: Abnormal   Collection Time: 01/20/21  4:09 AM  Result Value Ref Range   WBC 2.2 (L) 4.0 - 10.5 K/uL   RBC 2.98 (L) 4.22 - 5.81 MIL/uL   Hemoglobin 9.7 (L) 13.0 - 17.0 g/dL   HCT 27.7 (L) 39.0 - 52.0 %   MCV 93.0 80.0 - 100.0 fL   MCH 32.6 26.0 - 34.0 pg   MCHC 35.0 30.0 - 36.0 g/dL   RDW 20.4 (H) 11.5 - 15.5 %   Platelets 115 (L) 150 - 400 K/uL    Comment: Immature Platelet Fraction may be clinically indicated, consider ordering this additional test [id number]    nRBC 0.9 (H) 0.0 - 0.2 %    Comment: Performed at [facility], [address] [place], [place] [address]  Heparin level (unfractionated)     Status: Abnormal   Collection Time: 01/20/21  9:47 AM  Result Value Ref Range   Heparin Unfractionated 0.86 (H) 0.30 - 0.70 IU/mL    Comment: (NOTE) If heparin results are below expected values,  and patient dosage has  been confirmed, suggest follow up testing of antithrombin III levels. Performed at [facility], [address] [place], [place] [address]    CT CHEST ABDOMEN PELVIS W CONTRAST  Result Date: 01/19/2021 CLINICAL DATA:  Restaging post treatment for base of tongue carcinoma. Distal esophageal carcinoma subsequently diagnosed. Chemotherapy and radiation therapy in January. Shortness of breath on exertion. EXAM: CT CHEST, ABDOMEN, AND PELVIS WITH CONTRAST TECHNIQUE: Multidetector CT imaging of the chest, abdomen and pelvis was performed following the standard protocol during bolus administration of intravenous contrast. CONTRAST:  144mL OMNIPAQUE IOHEXOL 300 MG/ML  SOLN COMPARISON:  PET-CT 10/25/2020.  Abdominopelvic CT 09/24/2013. FINDINGS: CT CHEST FINDINGS Cardiovascular: The pulmonary arteries are well opacified with contrast. There is extensive nearly occlusive pulmonary thromboembolic disease bilaterally with involvement of the main and lobar right and left pulmonary arteries. Thrombus in the left pulmonary artery measures up to 4.4 cm on image 30/2. There is evidence of right ventricular strain with the right ventricular lumen measuring 4.7 cm and the left ventricular lumen measuring 3.7 cm. Mild atherosclerosis of the aorta, great vessels and coronary arteries. Right IJ Port-A-Cath extends to the superior cavoatrial junction. Overall heart size is normal, and there is no pericardial fluid. Mediastinum/Nodes: There are no enlarged mediastinal, hilar or axillary lymph nodes. Small hiatal hernia with mild distal esophageal wall thickening, but no apparent focal mass lesion. The trachea and thyroid gland appear unremarkable. Lungs/Pleura: No pleural effusion or pneumothorax. Mild centrilobular emphysema. No evidence of pulmonary infarct, infiltrate or suspicious nodule. Musculoskeletal/Chest wall: No chest wall mass or suspicious osseous findings. CT ABDOMEN AND PELVIS  FINDINGS Hepatobiliary: 4 mm low-density lesion in the left lobe on image 57/2 is similar to remote abdominal CT and appears benign. No new or enlarging hepatic lesions. No significant biliary dilatation post cholecystectomy. Stable pneumobilia attributed to prior sphincterotomy. Pancreas: Unremarkable. No pancreatic ductal dilatation or surrounding inflammatory changes. Spleen: Normal in size without focal abnormality. Adrenals/Urinary Tract: Both adrenal glands appear normal. The kidneys appear normal without evidence of urinary tract calculus, suspicious lesion or hydronephrosis. No bladder abnormalities are seen. Stomach/Bowel: As above, distal esophageal wall thickening without apparent focal mass lesion. There is a small hiatal hernia. The proximal stomach is incompletely distended. The small bowel, appendix and colon demonstrate no significant findings. There are mild sigmoid colon diverticular changes. Vascular/Lymphatic: There are no enlarged abdominal or pelvic lymph nodes. Mild aortic and branch vessel atherosclerosis without acute vascular findings. The portal, superior mesenteric and splenic veins are patent. Reproductive: Mild enlargement of the prostate gland. Other: Stable asymmetric prominent fat in the right inguinal canal. Postsurgical changes in the anterior abdominal wall with stable areas of laxity containing fat. No ascites or peritoneal nodularity. Musculoskeletal: No acute or significant osseous findings. IMPRESSION: 1. Extensive nearly occlusive pulmonary thromboembolic disease bilaterally with CT evidence of right heart strain consistent with at least submassive (intermediate risk) PE. The presence of right heart strain has been associated with an increased risk of morbidity and mortality. Please refer to the "PE Focused" order set in EPIC. 2. No evidence of metastatic disease within the chest, abdomen or pelvis. Improvement in previously demonstrated distal esophageal wall thickening  without  apparent focal mass lesion. 3. Aortic Atherosclerosis (ICD10-I70.0) and Emphysema (ICD10-J43.9). 4. Critical Value/emergent results were called by telephone at the time of interpretation on 01/19/2021 at 2:08 pm to provider [facility] , who verbally acknowledged these results. Electronically Signed   By: Richardean Sale M.D.   On: 01/19/2021 14:19   US Venous Img Lower Bilateral (DVT)  Result Date: 01/20/2021 CLINICAL DATA:  Pulmonary emboli. EXAM: BILATERAL LOWER EXTREMITY VENOUS DOPPLER ULTRASOUND TECHNIQUE: Gray-scale sonography with compression, as well as color and duplex ultrasound, were performed to evaluate the deep venous system(s) from the level of the common femoral vein through the popliteal and proximal calf veins. COMPARISON:  None. FINDINGS: VENOUS On the left, normal compressibility of the common femoral, superficial femoral, and popliteal veins, as well as the visualized calf veins. Visualized portions of profunda femoral vein and great saphenous vein unremarkable. No filling defects to suggest DVT on grayscale or color Doppler imaging. Doppler waveforms show normal direction of venous flow, normal respiratory phasicity and response to augmentation. On the right, occlusive hypoechoic thrombus in the deep femoral vein extending to the junction with the femoral vein. The common femoral, femoral, and popliteal veins remain patent. Visualized calf veins are patent. OTHER None. Limitations: none IMPRESSION: 1. POSITIVE for occlusive DVT in the right deep femoral vein. Electronically Signed   By: Lucrezia Europe M.D.   On: 01/20/2021 11:46    Assessment:  The patient is a 75 y.o. 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   PET scan on 10/25/2020 revealed right tongue base primary with ipsilateral level 2 nodal metastasis.  There were no findings of extracervical metastatic disease. There was focal hypermetabolism in the distal esophagus, with possible concurrent soft tissue density lesion.  There was vague right lower lobe low-level hypermetabolism and ground-glass nodularity, favored minimal infection or aspiration.  He received 6 weeks of cisplatin with concurrent radiation (11/20/2020 - 12/27/2020).  He completed radiation on 01/11/2021.  Chest, abdomen and pelvis CT on 01/19/2021 revealed extensive nearly occlusive pulmonary thromboembolic disease bilaterally with CT evidence of right heart strain consistent with at least a submassive PE.  There is no evidence of metastatic disease in the chest, abdomen and pelvis.  There was improvement in the distal esophageal wall thickening without apparent focal lesion.  Bilateral lower extremity duplex on 01/20/2021 revealed occlusive hypoechoic thrombus in the deep right femoral vein extending to the junction with the femoral vein.  The common femoral, femoral and popliteal veins remain patent.  Calf veins remain patent.  He has B12 deficiency and is on oral B12.  He has PTSDand clastrophobia.   Symptomatically ,he is doing well.  He denies any shortness of breath except with exertion.  He denies any chest pain or hemoptysis.  Plan:   1.   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  He will undergo outpatient PET scan on 04/10/2021.  Clinically, he has resolving neck adenopathy. 2.   Stage IB esophageal carcinoma  Imaging studies reveal improvement in the distal esophageal wall thickening.  Patient is scheduled to follow-up with Dr Keturah BarreAbigail Butts at [facility] 3.   Pancytopenia  Hematocrit 32.2.  Hemoglobin 11.2.  MCV 92.3.  WBC 3300.  Platelets 126,000 on 01/19/2021.  Hematocrit 27.7.  Hemoglobin   9.7.  MCV 93.0.  WBC 2200.  Platelets 115,000 on 01/20/2021.  Etiology secondary to chemotherapy.  Patient denies any bleeding.   Ferritin 424  with an iron saturation of 43% on 01/08/2021.  He is on oral B12 for B12 deficiency.  Continue to monitor.  4.   Bilateral pulmonary emboli and proximal RLE DVT  Clinically, he is doing well.  Etiology likely secondary to malignancy.   Limited hypercoagulable work-up will be sent.  He has been seen by vascular surgery.  Currently no plan for thrombectomy or thrombolysis.  Patient with a proximal RLE DVT.    Patient currently on heparin. 5.   Disposition  Anticipate ongoing hospitalization.  Follow-up with vascular surgery.  Anticipate discharge in next 1-2 days on Eliquis or Xarelto.  Thank you for allowing me to participate in Sean Garner. 's care.  I will follow him closely with you while hospitalized and after discharge in the outpatient department.   Lequita Asal, MD  01/20/2021, 2:23 PM

## 2021-01-20 NOTE — Progress Notes (Signed)
Initial Nutrition Assessment  DOCUMENTATION CODES:   Not applicable  INTERVENTION:  Ensure Enlive po BID, each supplement provides 350 kcal and 20 grams of protein (strawberry)   NUTRITION DIAGNOSIS:   Increased nutrient needs related to cancer and cancer related treatments as evidenced by estimated needs.    GOAL:   Patient will meet greater than or equal to 90% of their needs   MONITOR:   PO intake,Supplement acceptance,Labs,I & O's,Skin,Weight trends  REASON FOR ASSESSMENT:   Malnutrition Screening Tool    ASSESSMENT:  75 year old male admitted with bilateral pulmonary embolism found on outpatient CT chest abdomen/pelvis. 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  RD working remotely.  Pt completed radiation on 2/24 and last chemotherapy on 3/1  Spoke with pt via phone, says he is doing well, endorses good appetite and intake. States soreness in throat has improved, and eating much better. He recalls chicken, rice, carrots, pudding, tomato soup with crackers for lunch, says he ate almost all of it. He eats well at home and continues to drink 2 "high power" Ensure daily. Pt requesting strawberry flavored Ensure during admission. RD encouraged continued po intake of meals and will order Ensure BID.  Pt endorses wt loss during treatment, but have been stable 191-195 lbs over the last month. Currently he weighs 84.1 kg (185 lbs) Per chart, weights have trended down ~18 lbs (8.7%) in the last 2 months which is significant for time frame. Highly suspect degree of malnutrition, however unable to identify at this time. Will plan to complete exam at follow-up.  I/Os: -977.3 ml since admit UOP: 1850 ml x 24 hrs  Medications reviewed and include: SSI, Protonix, IV Heparin  IVF: LR @ 75 ml/hr  Labs: CBG 196, WBC 2.2 (L), Hgb 9.7 (L), HCT 27.7 (L) A1c  8.1 on 01/20/21 (H)  NUTRITION - FOCUSED PHYSICAL EXAM:  Unable to complete at this time  Diet Order:   Diet Order            Diet regular Room service appropriate? Yes; Fluid consistency: Thin  Diet effective now                 EDUCATION NEEDS:   Education needs have been addressed  Skin:  Skin Assessment: Reviewed RN Assessment  Last BM:  pta  Height:   Ht Readings from Last 1 Encounters:  01/19/21 5\' 10"  (1.778 m)    Weight:   Wt Readings from Last 1 Encounters:  01/20/21 84.1 kg    BMI:  Body mass index is 26.59 kg/m.  Estimated Nutritional Needs:   Kcal:  6979-4801  Protein:  125-135  Fluid:  2.3 L/day   Lajuan Lines, RD, LDN Clinical Nutrition After Hours/Weekend Pager # in [place]

## 2021-01-20 NOTE — Progress Notes (Signed)
ANTICOAGULATION CONSULT NOTE  Pharmacy Consult for Heparin drip Indication: pulmonary embolus  No Known Allergies  Patient Measurements: Height: 5\' 10"  (177.8 cm) Weight: 84.1 kg (185 lb 4.8 oz) IBW/kg (Calculated) : 73 Heparin Dosing Weight: 86  Vital Signs: Temp: 97.6 F (36.4 C) (03/05 0814) Temp Source: Oral (03/05 0814) BP: 133/73 (03/05 0814) Pulse Rate: 50 (03/05 0814)  Labs: Recent Labs    01/19/21 1459 01/19/21 1825 01/19/21 2348 01/20/21 0409 01/20/21 0947  HGB 11.2*  --   --  9.7*  --   HCT 32.2*  --   --  27.7*  --   PLT 126*  --   --  115*  --   APTT 26  --   --   --   --   LABPROT 14.1  --   --   --   --   INR 1.1  --   --   --   --   HEPARINUNFRC  --   --  1.15*  --  0.86*  CREATININE 0.81  --   --  0.74  --   TROPONINIHS 23* 21*  --   --   --     Estimated Creatinine Clearance: 83.6 mL/min (by C-G formula based on SCr of 0.74 mg/dL).   Medical History: Past Medical History:  Diagnosis Date  . Anxiety   . Cancer [facility])    Head/throat Cancer at the base of the tongue  . Cataract    lt eye repair; present in rt eye but not ripe yet.  . Claustrophobia   . Diabetes mellitus without complication ([place])   . GERD (gastroesophageal reflux disease)   . Glaucoma    using gtts for this.  . Hypercholesterolemia   . Hypertension     Medications:  Scheduled:  . brimonidine  1 drop Left Eye Daily   And  . timolol  1 drop Left Eye Daily  . citalopram  20 mg Oral Daily  . fluticasone  2 spray Each Nare Daily  . insulin aspart  0-9 Units Subcutaneous TID WC  . pantoprazole  40 mg Oral Daily  . simvastatin  40 mg Oral q1800   Infusions:  . heparin 1,250 Units/hr (01/20/21 0724)  . lactated ringers 75 mL/hr at 01/20/21 0413    Assessment: 75 yo M to start Heparin drip for PE. No anticoag PTA  per Med Rec Hgb 11.2  plt 126  INR 1.1  APTT 26  3/4:  HL @ 5885 = 1.15 Phlebotomist confirmed level was drawn from opposite arm that heparin was  infusing so this level is valid. Will hold heparin drip for 1 hr and restart @ 1250 units/hr.  Goal of Therapy:  Heparin level 0.3-0.7 units/ml Monitor platelets by anticoagulation protocol: Yes   Plan:  3/5:  HL @ 0947 = 0.86 (resulted at 1205), Supratherapeutic. Will decrease drip rate to 1100 units/hr. Will recheck HL 8 hrst.   Raniyah Curenton A 01/20/2021,12:17 PM

## 2021-01-21 DIAGNOSIS — I82409 Acute embolism and thrombosis of unspecified deep veins of unspecified lower extremity: Secondary | ICD-10-CM

## 2021-01-21 DIAGNOSIS — I2699 Other pulmonary embolism without acute cor pulmonale: Secondary | ICD-10-CM | POA: Diagnosis not present

## 2021-01-21 DIAGNOSIS — I2602 Saddle embolus of pulmonary artery with acute cor pulmonale: Principal | ICD-10-CM

## 2021-01-21 LAB — CBC
HCT: 28.8 % — ABNORMAL LOW (ref 39.0–52.0)
Hemoglobin: 9.7 g/dL — ABNORMAL LOW (ref 13.0–17.0)
MCH: 32 pg (ref 26.0–34.0)
MCHC: 33.7 g/dL (ref 30.0–36.0)
MCV: 95 fL (ref 80.0–100.0)
Platelets: 125 10*3/uL — ABNORMAL LOW (ref 150–400)
RBC: 3.03 MIL/uL — ABNORMAL LOW (ref 4.22–5.81)
RDW: 20.4 % — ABNORMAL HIGH (ref 11.5–15.5)
WBC: 2.4 10*3/uL — ABNORMAL LOW (ref 4.0–10.5)
nRBC: 0 % (ref 0.0–0.2)

## 2021-01-21 LAB — HEPARIN LEVEL (UNFRACTIONATED): Heparin Unfractionated: 0.58 IU/mL (ref 0.30–0.70)

## 2021-01-21 MED ORDER — CHLORHEXIDINE GLUCONATE CLOTH 2 % EX PADS: 6.0000 | MEDICATED_PAD | Freq: Every day | CUTANEOUS | Status: DC

## 2021-01-21 NOTE — Progress Notes (Signed)
ANTICOAGULATION CONSULT NOTE  Pharmacy Consult for Heparin drip Indication: pulmonary embolus  No Known Allergies  Patient Measurements: Height: 5\' 10"  (177.8 cm) Weight: 84 kg (185 lb 3.2 oz) IBW/kg (Calculated) : 73 Heparin Dosing Weight: 86  Vital Signs: Temp: 98 F (36.7 C) (03/06 0303) Temp Source: Oral (03/06 0303) BP: 116/60 (03/06 0303) Pulse Rate: 67 (03/06 0303)  Labs: Recent Labs    01/19/21 1459 01/19/21 1825 01/19/21 2348 01/20/21 0409 01/20/21 0947 01/20/21 2033 01/21/21 0441  HGB 11.2*  --   --  9.7*  --   --  9.7*  HCT 32.2*  --   --  27.7*  --   --  28.8*  PLT 126*  --   --  115*  --   --  125*  APTT 26  --   --   --   --   --   --   LABPROT 14.1  --   --   --   --   --   --   INR 1.1  --   --   --   --   --   --   HEPARINUNFRC  --   --    < >  --  0.86* 0.57 0.58  CREATININE 0.81  --   --  0.74  --   --   --   TROPONINIHS 23* 21*  --   --   --   --   --    < > = values in this interval not displayed.    Estimated Creatinine Clearance: 83.6 mL/min (by C-G formula based on SCr of 0.74 mg/dL).   Medical History: Past Medical History:  Diagnosis Date  . Anxiety   . Cancer [facility])    Head/throat Cancer at the base of the tongue  . Cataract    lt eye repair; present in rt eye but not ripe yet.  . Claustrophobia   . Diabetes mellitus without complication ([place])   . GERD (gastroesophageal reflux disease)   . Glaucoma    using gtts for this.  . Hypercholesterolemia   . Hypertension     Medications:  Scheduled:  . brimonidine  1 drop Left Eye Daily   And  . timolol  1 drop Left Eye Daily  . citalopram  20 mg Oral Daily  . feeding supplement  237 mL Oral BID BM  . fluticasone  2 spray Each Nare Daily  . insulin aspart  0-9 Units Subcutaneous TID WC  . pantoprazole  40 mg Oral Daily  . simvastatin  40 mg Oral q1800   Infusions:  . heparin 1,100 Units/hr (01/21/21 9629)  . lactated ringers 75 mL/hr at 01/20/21 1727    Assessment: 75 yo  M to start Heparin drip for PE. No anticoag PTA  per Med Rec Hgb 11.2>9.7  plt 126>115  INR 1.1  APTT 26  3/4:  HL @ 5284 = 1.15 Phlebotomist confirmed level was drawn from opposite arm that heparin was infusing so this level is valid. Will hold heparin drip for 1 hr and restart @ 1250 units/hr. 3/5:  HL @ 0947 = 0.86 (resulted at 1205), Supratherapeutic. Will decrease drip rate to 1100 units/hr. Will recheck HL 8 hrst.   Goal of Therapy:  Heparin level 0.3-0.7 units/ml Monitor platelets by anticoagulation protocol: Yes   Plan:  3/6:  HL @ 0441 = 0.58, therapeutic X 2  Will continue pt on current rate and recheck HL on 3/7 with AM  labs.   Orene Desanctis, PharmD 01/21/2021,6:14 AM

## 2021-01-21 NOTE — Evaluation (Signed)
Physical Therapy Evaluation Patient Details Name: Sean Garner. MRN: [id number] DOB: 04/14/1946 Today's Date: 01/21/2021   History of Present Illness  Sean Garner. is a 75 y.o. 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, distal esophageal carcinoma, hypertension, hyperlipidemia, type 2 diabetes mellitus, chronic hyponatremia with baseline serum sodium range of 1 25-1 32, who is admitted to [facility] on 01/19/2021 with acute bilateral pulmonary emboli after presenting from home to [facility] ED for further evaluation of acute bilateral pulmonary emboli that were incidentally found on outpatient CT chest abdomen/pelvis earlier in the day. The patient's case was discussed with the on-call vascular surgeon, Dr. Ronalee Belts, who recommended admission to the hospitalist service for further evaluation and management of presenting acute bilateral submassive pulmonary emboli, including initiation of heparin drip. Vascular surgery will consult and follow. Dr. Ronalee Belts did not feel that thrombectomy was indicated at this time, but will monitor closely for development of indications for such. DVT Rt femoral vein found on venous US on 3/5. Per vascular-we will need to ambulate patient to see if has severe functional problems then they will move forward with thrombectomy on Tuesday    Clinical Impression  Patient alert and oriented and able to provide detailed history. Wife present throughout session; daughter present for subjective exam. Both contributed information appropriately and with permission from patient. Patient lives in a one story home with a basement with his wife who is able to be there 24/7. He can live on the main floor and has one step to enter with handrail. Prior to hospitalization, patient was fully independent in all aspects of care and mobility with no assistive device, although wife states she has been driving him  most of the time since his cancer diagnosis. He denies any falls in the last 6 months. Upon PT evaluation, patient was independent with bed mobility and completed sit <> stand transfers with supervision and assistance with line management. He ambulated ~ 320 feet with supervision and no AD. Noted for left foot drop causing foot slap while wearing shoes and ambulating. Both patient and wife states they noticed this recently but neither know if it was present prior to hospitalization. Further assessment of left ankle demonstrate decreased strength compared to right. Quick screen of BUE strength, facial symmetry, and visual field appear WNL and reasonably symmetrical. Patient is able to toe walk bilaterally but not heel walk on the left only. Patient was able to ambulate with no tripping or dragging of left toe. Patient ascended/decended 6 steps with supervision and unilateral UE support on L railing with mix of step through and step to gait pattern. Overall, patient appears to have sufficient functional mobility and balance for discharge home but may have developed left foot drop. Patient would benefit from skilled physical therapy to address impairments and functional limitations (see PT Problem List below) to work towards stated goals and return to PLOF or maximal functional independence.       Follow Up Recommendations Home health PT    Equipment Recommendations  None recommended by PT    Recommendations for Other Services       Precautions / Restrictions Precautions Precautions: Fall Precaution Comments: B PEs and R femoral artery DVT Restrictions Weight Bearing Restrictions: No      Mobility  Bed Mobility Overal bed mobility: Independent             General bed mobility comments: completed supine <>  sit independently with bed flat    Transfers Overall transfer level: Needs assistance   Transfers: Sit to/from Stand Sit to Stand: Supervision         General transfer  comment: Patient completed sit <> stand at bed/chair with supervision and assistance with line management. Did not need cuing for safety.  Ambulation/Gait Ambulation/Gait assistance: Supervision Gait Distance (Feet): 320 Feet (over 300 feet) Assistive device: None   Gait velocity: WFL   General Gait Details: Patient ambulated ~ 320 feet with no AD around B pod and C pod nursing station and down the hall to the stairs and back with supervision and no loss of balance. PT managing IV pole. Noted for foot slap on L foot and patient was unable to heel walk on the left side. Discussed this and he said he had noticed it but could not remember if it was present prior to hospitalization. Wife stated she noticed too but also unsure if present prior to hospitalization. Gait otherwise WFL.  Stairs Stairs: Yes Stairs assistance: Supervision Stair Management: One rail Left Number of Stairs: 6 General stair comments: Patient ascended/descended 6 steps using unilateral UE support on left handrail. Alternated between step to and step through pattern. Was unable to move as fast as he wished due to waiting for the PT who needed additional time to manage IV pole up and down stairs safely.  Wheelchair Mobility    Modified Rankin (Stroke Patients Only)       Balance Overall balance assessment: Needs assistance   Sitting balance-Leahy Scale: Normal       Standing balance-Leahy Scale: Good Standing balance comment: Balance appears WNL for basic mobilty with no AD. Does demo left foot slap but does not catch left toe on floor during mobility.                             Pertinent Vitals/Pain Pain Assessment: No/denies pain    Home Living Family/patient expects to be discharged to:: Private residence Living Arrangements: Spouse/significant other Available Help at Discharge: Available 24 hours/day Type of Home: House Home Access: Stairs to enter Entrance Stairs-Rails: Left Entrance  Stairs-Number of Steps: 1 Home Layout: Two level;Able to live on main level with bedroom/bathroom (one level with basement) Home Equipment: Shower seat;Wheelchair - manual      Prior Function Level of Independence: Independent         Comments: Patient reports that prior to hospitalization he was I with all aspects of care. Wife has been driving him around more since his cancer diagnosis late last year.     Hand Dominance   Dominant Hand: Right    Extremity/Trunk Assessment   Upper Extremity Assessment Upper Extremity Assessment: Overall WFL for tasks assessed (B UE appear WFL. Screend of MMT shoulder flexion, abduction, ER, IR, elbow flexion, extension, finger abduction, thumb extension appear WFL and reasonably equal bilaterally.)    Lower Extremity Assessment Lower Extremity Assessment: Overall WFL for tasks assessed;LLE deficits/detail LLE Deficits / Details: Patient noted for L foot slap during ambulation. He is unable to heel walk on the left but is able to toe walk bilaterally. L dorsiflexion AROM is incomplete with 3+/5 MMT upon resistance, great toe extension is 3/5, eversion is 3+/5 and PF is 4+/5. B knee extension appears WFL.    Cervical / Trunk Assessment Cervical / Trunk Assessment: Normal  Communication   Communication: No difficulties  Cognition Arousal/Alertness: Awake/alert Behavior During Therapy: [facility]  for tasks assessed/performed Overall Cognitive Status: Within Functional Limits for tasks assessed                                        General Comments General comments (skin integrity, edema, etc.): quick screen of facial symmetry and visual field appeared WNL. Denies falls in the last 6 months.    Exercises Other Exercises Other Exercises: Educated patient and family about role of PT in acute care setting, findings of weakness on left ankle compared to right, discharge reccomendations.   Assessment/Plan    PT Assessment Patient  needs continued PT services  PT Problem List Decreased strength;Decreased activity tolerance;Decreased mobility;Decreased range of motion;Cardiopulmonary status limiting activity       PT Treatment Interventions DME instruction;Balance training;Gait training;Neuromuscular re-education;Stair training;Functional mobility training;Therapeutic activities;Therapeutic exercise    PT Goals (Current goals can be found in the Care Plan section)  Acute Rehab PT Goals Patient Stated Goal: go home PT Goal Formulation: With patient/family Time For Goal Achievement: 02/04/21 Potential to Achieve Goals: Good    Frequency Min 2X/week   Barriers to discharge        Co-evaluation               AM-PAC PT "6 Clicks" Mobility  Outcome Measure Help needed turning from your back to your side while in a flat bed without using bedrails?: None Help needed moving from lying on your back to sitting on the side of a flat bed without using bedrails?: None Help needed moving to and from a bed to a chair (including a wheelchair)?: None Help needed standing up from a chair using your arms (e.g., wheelchair or bedside chair)?: None Help needed to walk in hospital room?: None Help needed climbing 3-5 steps with a railing? : A Little 6 Click Score: 23    End of Session Equipment Utilized During Treatment: Gait belt Activity Tolerance: Patient tolerated treatment well Patient left: in chair;with call bell/phone within reach;with family/visitor present Nurse Communication: Mobility status PT Visit Diagnosis: Muscle weakness (generalized) (M62.81);Other abnormalities of gait and mobility (R26.89)    Time: 1791-5056 PT Time Calculation (min) (ACUTE ONLY): 28 min   Charges:   PT Evaluation $PT Eval Moderate Complexity: 1 Mod PT Treatments $Gait Training: 8-22 mins        Everlean Alstrom. Graylon Good, PT, DPT 01/21/21, 5:21 PM

## 2021-01-21 NOTE — Plan of Care (Signed)

## 2021-01-21 NOTE — Progress Notes (Signed)
PROGRESS NOTE    Sean Garner.  OZD:[id number] DOB: Mar 23, 1946 DOA: 01/19/2021 PCP: Derinda Late, MD    Brief Narrative:  Sean Garner. is a 75 y.o. 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 on 01/19/2021 with acute bilateral pulmonary emboli after presenting from home to [facility] ED for further evaluation of acute bilateral pulmonary emboli that were incidentally found on outpatient CT chest abdomen/pelvis earlier in the day.The patient reports that most recent chemotherapy occurred approximately 1 week ago.  He has also undergone a total of 35 rounds of radiation, which she reports represents the totality of the current treatment plan.  His outpatient oncologist is Dr. Mike Gip.   3/5- no overnight issues. Vascular surgery did not feel thrombectomy was indicated 3/6-no overnight issues DVT Rt found on venous US on 3/5    Consultants:   Vascular surgery , hematology  Procedures:   Antimicrobials:       Subjective: No sob, cp, dizziness  Objective: Vitals:   01/20/21 1938 01/21/21 0259 01/21/21 0303 01/21/21 0744  BP: 120/63  116/60 (!) 134/58  Pulse: 70  67 (!) 58  Resp: 18  20 16   Temp: 98.4 F (36.9 C)  98 F (36.7 C) 97.8 F (36.6 C)  TempSrc: Oral  Oral Oral  SpO2: 93%  94% 93%  Weight:  84 kg    Height:        Intake/Output Summary (Last 24 hours) at 01/21/2021 0818 Last data filed at 01/21/2021 0255 Gross per 24 hour  Intake 240 ml  Output 1650 ml  Net -1410 ml   Filed Weights   01/19/21 1427 01/20/21 0415 01/21/21 0259  Weight: 85.8 kg 84.1 kg 84 kg    Examination: Nad, calm cta no w/r/r Regular s1/s2 no gallop Soft benign +bs Mild Rt LE, no cyanosis aaxox3 grossly intact Mood  an affect appropriate in current setting   Data Reviewed: I have personally reviewed following labs and imaging studies  CBC: Recent Labs  Lab 01/16/21 0805 01/19/21 1459 01/20/21 0409 01/21/21 0441  WBC 2.4* 3.3* 2.2* 2.4*  NEUTROABS 1.8 2.4  --   --   HGB 10.6* 11.2* 9.7* 9.7*  HCT 32.3* 32.2* 27.7* 28.8*  MCV 92.3 92.3 93.0 95.0  PLT 102* 126* 115* 259*   Basic Metabolic Panel: Recent Labs  Lab 01/16/21 0805 01/19/21 1459 01/19/21 1825 01/20/21 0409  NA 135 131*  --  136  K 3.4* 3.6  --  3.7  CL 96* 96*  --  101  CO2 28 28  --  27  GLUCOSE 157* 178*  --  159*  BUN 29* 28*  --  16  CREATININE 0.90 0.81  --  0.74  CALCIUM 9.3 9.0  --  8.5*  MG 1.6*  --  2.0 1.9  PHOS  --   --   --  3.2   GFR: Estimated Creatinine Clearance: 83.6 mL/min (by C-G formula based on SCr of 0.74 mg/dL). Liver Function Tests: Recent Labs  Lab 01/19/21 1459  AST 18  ALT 25  ALKPHOS 46  BILITOT 1.1  PROT 6.4*  ALBUMIN 4.1   No results for input(s): LIPASE, AMYLASE in the last 168 hours. No results for input(s): AMMONIA in the last 168 hours. Coagulation Profile: Recent  Labs  Lab 01/19/21 1459  INR 1.1   Cardiac Enzymes: No results for input(s): CKTOTAL, CKMB, CKMBINDEX, TROPONINI in the last 168 hours. BNP (last 3 results) No results for input(s): PROBNP in the last 8760 hours. HbA1C: Recent Labs    01/20/21 0409  HGBA1C 8.1*   CBG: Recent Labs  Lab 01/19/21 1827 01/19/21 2120  GLUCAP 209* 196*   Lipid Profile: No results for input(s): CHOL, HDL, LDLCALC, TRIG, CHOLHDL, LDLDIRECT in the last 72 hours. Thyroid Function Tests: No results for input(s): TSH, T4TOTAL, FREET4, T3FREE, THYROIDAB in the last 72 hours. Anemia Panel: No results for input(s): VITAMINB12, FOLATE, FERRITIN, TIBC, IRON, RETICCTPCT in the last 72 hours. Sepsis Labs: Recent Labs  Lab 01/19/21 1459  LATICACIDVEN 1.2    Recent Results (from the past 240 hour(s))  Resp Panel by RT-PCR (Flu  A&B, Covid) Nasopharyngeal Swab     Status: None   Collection Time: 01/19/21  2:59 PM   Specimen: Nasopharyngeal Swab; Nasopharyngeal(NP) swabs in vial transport medium  Result Value Ref Range Status   SARS Coronavirus 2 by RT PCR NEGATIVE NEGATIVE Final    Comment: (NOTE) SARS-CoV-2 target nucleic acids are NOT DETECTED.  The SARS-CoV-2 RNA is generally detectable in upper respiratory specimens during the acute phase of infection. The lowest concentration of SARS-CoV-2 viral copies this assay can detect is 138 copies/mL. A negative result does not preclude SARS-Cov-2 infection and should not be used as the sole basis for treatment or other patient management decisions. A negative result may occur with  improper specimen collection/handling, submission of specimen other than nasopharyngeal swab, presence of viral mutation(s) within the areas targeted by this assay, and inadequate number of viral copies(<138 copies/mL). A negative result must be combined with clinical observations, patient history, and epidemiological information. The expected result is Negative.  Fact Sheet for Patients:  EntrepreneurPulse.com.au  Fact Sheet for Healthcare Providers:  IncredibleEmployment.be  This test is no t yet approved or cleared by the [place] FDA and  has been authorized for detection and/or diagnosis of SARS-CoV-2 by FDA under an Emergency Use Authorization (EUA). This EUA will remain  in effect (meaning this test can be used) for the duration of the COVID-19 declaration under Section 564(b)(1) of the Act, 21 U.S.C.section 360bbb-3(b)(1), unless the authorization is terminated  or revoked sooner.       Influenza A by PCR NEGATIVE NEGATIVE Final   Influenza B by PCR NEGATIVE NEGATIVE Final    Comment: (NOTE) The Xpert Xpress SARS-CoV-2/FLU/RSV plus assay is intended as an aid in the diagnosis of influenza from Nasopharyngeal swab specimens  and should not be used as a sole basis for treatment. Nasal washings and aspirates are unacceptable for Xpert Xpress SARS-CoV-2/FLU/RSV testing.  Fact Sheet for Patients: EntrepreneurPulse.com.au  Fact Sheet for Healthcare Providers: IncredibleEmployment.be  This test is not yet approved or cleared by the [place] FDA and has been authorized for detection and/or diagnosis of SARS-CoV-2 by FDA under an Emergency Use Authorization (EUA). This EUA will remain in effect (meaning this test can be used) for the duration of the COVID-19 declaration under Section 564(b)(1) of the Act, 21 U.S.C. section 360bbb-3(b)(1), unless the authorization is terminated or revoked.  Performed at [facility], [address], [place], [place] [address]          Radiology Studies: CT CHEST ABDOMEN PELVIS W CONTRAST  Result Date: 01/19/2021 CLINICAL DATA:  Restaging post treatment for base of tongue carcinoma. Distal esophageal carcinoma subsequently  diagnosed. Chemotherapy and radiation therapy in January. Shortness of breath on exertion. EXAM: CT CHEST, ABDOMEN, AND PELVIS WITH CONTRAST TECHNIQUE: Multidetector CT imaging of the chest, abdomen and pelvis was performed following the standard protocol during bolus administration of intravenous contrast. CONTRAST:  111mL OMNIPAQUE IOHEXOL 300 MG/ML  SOLN COMPARISON:  PET-CT 10/25/2020.  Abdominopelvic CT 09/24/2013. FINDINGS: CT CHEST FINDINGS Cardiovascular: The pulmonary arteries are well opacified with contrast. There is extensive nearly occlusive pulmonary thromboembolic disease bilaterally with involvement of the main and lobar right and left pulmonary arteries. Thrombus in the left pulmonary artery measures up to 4.4 cm on image 30/2. There is evidence of right ventricular strain with the right ventricular lumen measuring 4.7 cm and the left ventricular lumen measuring 3.7 cm. Mild atherosclerosis of the  aorta, great vessels and coronary arteries. Right IJ Port-A-Cath extends to the superior cavoatrial junction. Overall heart size is normal, and there is no pericardial fluid. Mediastinum/Nodes: There are no enlarged mediastinal, hilar or axillary lymph nodes. Small hiatal hernia with mild distal esophageal wall thickening, but no apparent focal mass lesion. The trachea and thyroid gland appear unremarkable. Lungs/Pleura: No pleural effusion or pneumothorax. Mild centrilobular emphysema. No evidence of pulmonary infarct, infiltrate or suspicious nodule. Musculoskeletal/Chest wall: No chest wall mass or suspicious osseous findings. CT ABDOMEN AND PELVIS FINDINGS Hepatobiliary: 4 mm low-density lesion in the left lobe on image 57/2 is similar to remote abdominal CT and appears benign. No new or enlarging hepatic lesions. No significant biliary dilatation post cholecystectomy. Stable pneumobilia attributed to prior sphincterotomy. Pancreas: Unremarkable. No pancreatic ductal dilatation or surrounding inflammatory changes. Spleen: Normal in size without focal abnormality. Adrenals/Urinary Tract: Both adrenal glands appear normal. The kidneys appear normal without evidence of urinary tract calculus, suspicious lesion or hydronephrosis. No bladder abnormalities are seen. Stomach/Bowel: As above, distal esophageal wall thickening without apparent focal mass lesion. There is a small hiatal hernia. The proximal stomach is incompletely distended. The small bowel, appendix and colon demonstrate no significant findings. There are mild sigmoid colon diverticular changes. Vascular/Lymphatic: There are no enlarged abdominal or pelvic lymph nodes. Mild aortic and branch vessel atherosclerosis without acute vascular findings. The portal, superior mesenteric and splenic veins are patent. Reproductive: Mild enlargement of the prostate gland. Other: Stable asymmetric prominent fat in the right inguinal canal. Postsurgical changes in the  anterior abdominal wall with stable areas of laxity containing fat. No ascites or peritoneal nodularity. Musculoskeletal: No acute or significant osseous findings. IMPRESSION: 1. Extensive nearly occlusive pulmonary thromboembolic disease bilaterally with CT evidence of right heart strain consistent with at least submassive (intermediate risk) PE. The presence of right heart strain has been associated with an increased risk of morbidity and mortality. Please refer to the "PE Focused" order set in EPIC. 2. No evidence of metastatic disease within the chest, abdomen or pelvis. Improvement in previously demonstrated distal esophageal wall thickening without apparent focal mass lesion. 3. Aortic Atherosclerosis (ICD10-I70.0) and Emphysema (ICD10-J43.9). 4. Critical Value/emergent results were called by telephone at the time of interpretation on 01/19/2021 at 2:08 pm to provider [facility] , who verbally acknowledged these results. Electronically Signed   By: Richardean Sale M.D.   On: 01/19/2021 14:19   US Venous Img Lower Bilateral (DVT)  Result Date: 01/20/2021 CLINICAL DATA:  Pulmonary emboli. EXAM: BILATERAL LOWER EXTREMITY VENOUS DOPPLER ULTRASOUND TECHNIQUE: Gray-scale sonography with compression, as well as color and duplex ultrasound, were performed to evaluate the deep venous system(s) from the level of the common femoral vein  through the popliteal and proximal calf veins. COMPARISON:  None. FINDINGS: VENOUS On the left, normal compressibility of the common femoral, superficial femoral, and popliteal veins, as well as the visualized calf veins. Visualized portions of profunda femoral vein and great saphenous vein unremarkable. No filling defects to suggest DVT on grayscale or color Doppler imaging. Doppler waveforms show normal direction of venous flow, normal respiratory phasicity and response to augmentation. On the right, occlusive hypoechoic thrombus in the deep femoral vein extending to the junction  with the femoral vein. The common femoral, femoral, and popliteal veins remain patent. Visualized calf veins are patent. OTHER None. Limitations: none IMPRESSION: 1. POSITIVE for occlusive DVT in the right deep femoral vein. Electronically Signed   By: Lucrezia Europe M.D.   On: 01/20/2021 11:46        Scheduled Meds: . brimonidine  1 drop Left Eye Daily   And  . timolol  1 drop Left Eye Daily  . citalopram  20 mg Oral Daily  . feeding supplement  237 mL Oral BID BM  . fluticasone  2 spray Each Nare Daily  . insulin aspart  0-9 Units Subcutaneous TID WC  . pantoprazole  40 mg Oral Daily  . simvastatin  40 mg Oral q1800   Continuous Infusions: . heparin 1,100 Units/hr (01/21/21 3235)  . lactated ringers 75 mL/hr at 01/20/21 1727    Assessment & Plan:   Principal Problem:   Pulmonary embolism, bilateral (HCC) Active Problems:   Benign essential hypertension   GERD (gastroesophageal reflux disease)   Type 2 diabetes mellitus without complications (HCC)   Anxiety   Hyponatremia   SOB (shortness of breath)   Elevated troponin   Pancytopenia (HCC)   Sean Garner. is a 75 y.o. 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   #) Acute bilateral pulmonary emboli:   No evidence of associated hypotension to warrant consideration for TPA administration at this time. Appears to be patient's first PE/DVT.  Appears to be provoked in the setting of the presence of hypercoagulable state on the  basis of squamous cell cancer of the tongue followed by distal esophageal carcinoma, without additional DVT/PE risk factors identified at this time. 3/5-venous US b/l -POSITIVE for occlusive DVT in the right deep femoral vein Spoke to pt's hematologist Dr. Mike Gip 3/6-etiology likely secondary to malignancy Limited hypercoagulable work up sent per hematology continue with iv heparin gtt Per vascular-we will need to ambulate patient to see if has severe functional problems then they will move forward with thrombectomy on Tuesday -for now no plan for thrombectomy and as above Dc ivf Monitor hymodynamics Will eventually transition to Eliquis when timing ok by vascular     #) Elevated troponin:mildly elevated.  Likely 2/2 demand ischamia. Cp free Likely demand ischemia. Chest pain free Trended down Ck echo      #) Acute hypoxic respiratory distress: in the context of no known baseline supplemental oxygen requirements, presenting O2 sat noted to be 88% on room air, which is improved to 100% on 2 L nasal cannula 3/6-likely 2/2  PE Keep 02 sat >92% Currently on 2L sat 95%, will wean as tolerated especially with ambulation    #) Pancytopenia: In the context of recent chemotherapy Monitor cbc while on heparin gtt Hematology following No bleed     #) Essential hypertension: On losartan as outpatient Hold as pt's bp stable     #) GERD:  Continue PPI      #) Type 2 diabetes mellitus: None insulin-dependent as an outpatient.  Rather, the patient is on Metformin and glipizide  F/u A1c Ck FS RISS     #) Generalized anxiety disorder:  Continue celexa     #) Allergic rhinitis: On Flonase as an outpatient.      #)hyponatremia: Associated with baseline serum sodium range of 125-132 More hypovolemic Improved with ivf, Na 131>>136     DVT prophylaxis: Heparin drip Code Status: Full Family Communication: none at bedside  Status  is: Inpatient  Remains inpatient appropriate because:IV treatments appropriate due to intensity of illness or inability to take PO   Dispo: The patient is from: Home              Anticipated d/c is to: Home              Patient currently is not medically stable to d/c.   Difficult to place patient No               Anticipation of discharge :2-3  Days. Needs to be evaluate for ? Thrombectomy if becomes unstable. PT consulted.           LOS: 2 days   Time spent: 35 minutes with more than 50% on [place], MD [facility] Pager [phone]  If 7PM-7AM, please contact night-coverage 01/21/2021, 8:18 AM

## 2021-01-21 NOTE — Progress Notes (Signed)
[facility] Hematology/Oncology Progress Note  Date of admission: 01/19/2021  Hospital day:  01/21/2021  Chief Complaint: Sean Garner. is a 75 y.o. 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   Subjective:  Feels good.  Walking around ward.  Off oxygen.  Social History: The patient is alone today.  Allergies: No Known Allergies  Scheduled Medications: . brimonidine  1 drop Left Eye Daily   And  . timolol  1 drop Left Eye Daily  . citalopram  20 mg Oral Daily  . feeding supplement  237 mL Oral BID BM  . fluticasone  2 spray Each Nare Daily  . insulin aspart  0-9 Units Subcutaneous TID WC  . pantoprazole  40 mg Oral Daily  . simvastatin  40 mg Oral q1800    Review of Systems: GENERAL:  Feels good. No fevers or sweats. PERFORMANCE STATUS (ECOG): 1 HEENT:  Nearly resolved sore throat s/p radiation.  No visual changes, runny nose, mouth sores or tenderness. Lungs: Mild shortness of breath.  No cough.  No hemoptysis. Cardiac:  No chest pain, palpitations, orthopnea, or PND. GI:  No nausea, vomiting, diarrhea, constipation, melena or hematochezia. GU:  No urgency, frequency, dysuria, or hematuria. Musculoskeletal:  No back pain.  No joint pain.  No muscle tenderness. Extremities:  Slight right lower extremity edema.  No pain. Skin:  No rashes or skin changes. Neuro:  No headache, numbness or weakness, balance or coordination issues. Endocrine:  No diabetes, thyroid issues, hot flashes or night sweats. Psych:  On citalopram and Xanax for anxiety.  No mood changes. Pain:  No focal pain. Review of systems:  All other systems reviewed and found to be negative.  Physical Exam: Blood pressure (!) 134/58, pulse (!) 58, temperature 97.8 F (36.6 C), temperature source Oral, resp. rate 16, height 5\' 10"  (1.778 m), weight 185 lb 3.2 oz (84 kg),  SpO2 93 %.  GENERAL:  Well developed, well nourished, gentlemman sitting comfortably on the medical unit in no acute distress. MENTAL STATUS:  Alert and oriented to person, place and time. HEAD:  Pearline Cables hair.  Normocephalic, atraumatic, face symmetric, no Cushingoid features. EYES:  Pupils equal round and reactive to light and accomodation.  No conjunctivitis or scleral icterus. ENT:  Oropharynx clear without lesion.  Tongue normal. Mucous membranes moist.  RESPIRATORY:  Clear to auscultation without rales, wheezes or rhonchi. CARDIOVASCULAR:  Regular rate and rhythm without murmur, rub or gallop. ABDOMEN:  Soft, non-tender, with active bowel sounds, and no hepatosplenomegaly.  No masses. SKIN:  No rashes, ulcers or lesions. EXTREMITIES: Trace - 1+ right lower extremity edema.  No skin discoloration or tenderness.  No palpable cords. NEUROLOGICAL: Unremarkable. PSYCH:  Appropriate.   Results for orders placed or performed during the hospital encounter of 01/19/21 (from the past 48 hour(s))  CBC with Differential     Status: Abnormal   Collection Time: 01/19/21  2:59 PM  Result Value Ref Range   WBC 3.3 (L) 4.0 - 10.5 K/uL   RBC 3.49 (L) 4.22 - 5.81 MIL/uL   Hemoglobin 11.2 (L) 13.0 - 17.0 g/dL   HCT 32.2 (L) 39.0 - 52.0 %   MCV 92.3 80.0 - 100.0 fL   MCH 32.1 26.0 - 34.0 pg   MCHC 34.8 30.0 - 36.0 g/dL   RDW 20.0 (H) 11.5 - 15.5 %  Platelets 126 (L) 150 - 400 K/uL    Comment: Immature Platelet Fraction may be clinically indicated, consider ordering this additional test [id number]    nRBC 0.0 0.0 - 0.2 %   Neutrophils Relative % 75 %   Neutro Abs 2.4 1.7 - 7.7 K/uL   Lymphocytes Relative 11 %   Lymphs Abs 0.4 (L) 0.7 - 4.0 K/uL   Monocytes Relative 9 %   Monocytes Absolute 0.3 0.1 - 1.0 K/uL   Eosinophils Relative 0 %   Eosinophils Absolute 0.0 0.0 - 0.5 K/uL   Basophils Relative 0 %   Basophils Absolute 0.0 0.0 - 0.1 K/uL   Immature Granulocytes 5 %   Abs Immature  Granulocytes 0.15 (H) 0.00 - 0.07 K/uL    Comment: Performed at [facility], [address] [place], [place] [address]  Comprehensive metabolic panel     Status: Abnormal   Collection Time: 01/19/21  2:59 PM  Result Value Ref Range   Sodium 131 (L) 135 - 145 mmol/L   Potassium 3.6 3.5 - 5.1 mmol/L   Chloride 96 (L) 98 - 111 mmol/L   CO2 28 22 - 32 mmol/L   Glucose, Bld 178 (H) 70 - 99 mg/dL    Comment: Glucose reference range applies only to samples taken after fasting for at least 8 hours.   BUN 28 (H) 8 - 23 mg/dL   Creatinine, Ser 0.81 0.61 - 1.24 mg/dL   Calcium 9.0 8.9 - 10.3 mg/dL   Total Protein 6.4 (L) 6.5 - 8.1 g/dL   Albumin 4.1 3.5 - 5.0 g/dL   AST 18 15 - 41 U/L   ALT 25 0 - 44 U/L   Alkaline Phosphatase 46 38 - 126 U/L   Total Bilirubin 1.1 0.3 - 1.2 mg/dL   GFR, Estimated >60 >60 mL/min    Comment: (NOTE) Calculated using the CKD-EPI Creatinine Equation (2021)    Anion gap 7 5 - 15    Comment: Performed at [facility], [place], [place] [address]  Troponin I (High Sensitivity)     Status: Abnormal   Collection Time: 01/19/21  2:59 PM  Result Value Ref Range   Troponin I (High Sensitivity) 23 (H) <18 ng/L    Comment: (NOTE) Elevated high sensitivity troponin I (hsTnI) values and significant  changes across serial measurements may suggest ACS but many other  chronic and acute conditions are known to elevate hsTnI results.  Refer to the "Links" section for chest pain algorithms and additional  guidance. Performed at [facility], [address] [place], [place] [address]   Lactic acid, plasma     Status: None   Collection Time: 01/19/21  2:59 PM  Result Value Ref Range   Lactic Acid, Venous 1.2 0.5 - 1.9 mmol/L    Comment: Performed at [facility], [address] [place], [place] [address]  Protime-INR     Status: None   Collection Time: 01/19/21  2:59 PM  Result Value Ref Range   Prothrombin Time  14.1 11.4 - 15.2 seconds   INR 1.1 0.8 - 1.2    Comment: (NOTE) INR goal varies based on device and disease states. Performed at [facility], [address] [place], [place] [address]   APTT     Status: None   Collection Time: 01/19/21  2:59 PM  Result Value Ref Range   aPTT 26 24 - 36 seconds    Comment: Performed at [facility], [place], [place] [address]  Brain natriuretic peptide     Status: Abnormal   Collection Time: 01/19/21  2:59 PM  Result Value Ref Range   B Natriuretic Peptide 221.1 (H) 0.0 - 100.0 pg/mL    Comment: Performed at [facility], [address] [place], [place] [address]  Resp Panel by RT-PCR (Flu A&B, Covid) Nasopharyngeal Swab     Status: None   Collection Time: 01/19/21  2:59 PM   Specimen: Nasopharyngeal Swab; Nasopharyngeal(NP) swabs in vial transport medium  Result Value Ref Range   SARS Coronavirus 2 by RT PCR NEGATIVE NEGATIVE    Comment: (NOTE) SARS-CoV-2 target nucleic acids are NOT DETECTED.  The SARS-CoV-2 RNA is generally detectable in upper respiratory specimens during the acute phase of infection. The lowest concentration of SARS-CoV-2 viral copies this assay can detect is 138 copies/mL. A negative result does not preclude SARS-Cov-2 infection and should not be used as the sole basis for treatment or other patient management decisions. A negative result may occur with  improper specimen collection/handling, submission of specimen other than nasopharyngeal swab, presence of viral mutation(s) within the areas targeted by this assay, and inadequate number of viral copies(<138 copies/mL). A negative result must be combined with clinical observations, patient history, and epidemiological information. The expected result is Negative.  Fact Sheet for Patients:  EntrepreneurPulse.com.au  Fact Sheet for Healthcare Providers:  IncredibleEmployment.be  This test  is no t yet approved or cleared by the [place] FDA and  has been authorized for detection and/or diagnosis of SARS-CoV-2 by FDA under an Emergency Use Authorization (EUA). This EUA will remain  in effect (meaning this test can be used) for the duration of the COVID-19 declaration under Section 564(b)(1) of the Act, 21 U.S.C.section 360bbb-3(b)(1), unless the authorization is terminated  or revoked sooner.       Influenza A by PCR NEGATIVE NEGATIVE   Influenza B by PCR NEGATIVE NEGATIVE    Comment: (NOTE) The Xpert Xpress SARS-CoV-2/FLU/RSV plus assay is intended as an aid in the diagnosis of influenza from Nasopharyngeal swab specimens and should not be used as a sole basis for treatment. Nasal washings and aspirates are unacceptable for Xpert Xpress SARS-CoV-2/FLU/RSV testing.  Fact Sheet for Patients: EntrepreneurPulse.com.au  Fact Sheet for Healthcare Providers: IncredibleEmployment.be  This test is not yet approved or cleared by the [place] FDA and has been authorized for detection and/or diagnosis of SARS-CoV-2 by FDA under an Emergency Use Authorization (EUA). This EUA will remain in effect (meaning this test can be used) for the duration of the COVID-19 declaration under Section 564(b)(1) of the Act, 21 U.S.C. section 360bbb-3(b)(1), unless the authorization is terminated or revoked.  Performed at [facility], [place], [place] [address]   Troponin I (High Sensitivity)     Status: Abnormal   Collection Time: 01/19/21  6:25 PM  Result Value Ref Range   Troponin I (High Sensitivity) 21 (H) <18 ng/L    Comment: (NOTE) Elevated high sensitivity troponin I (hsTnI) values and significant  changes across serial measurements may suggest ACS but many other  chronic and acute conditions are known to elevate hsTnI results.  Refer to the "Links" section for chest pain algorithms and additional   guidance. Performed at [facility], [address], [place], [place] [address]   Magnesium     Status: None   Collection Time: 01/19/21  6:25 PM  Result Value Ref Range  Magnesium 2.0 1.7 - 2.4 mg/dL    Comment: Performed at [facility], [address] [place], [place] [address]  CBG monitoring, ED     Status: Abnormal   Collection Time: 01/19/21  6:27 PM  Result Value Ref Range   Glucose-Capillary 209 (H) 70 - 99 mg/dL    Comment: Glucose reference range applies only to samples taken after fasting for at least 8 hours.  Sodium, urine, random     Status: None   Collection Time: 01/19/21  7:33 PM  Result Value Ref Range   Sodium, Ur 99 mmol/L    Comment: Performed at [facility], [address] [place], [place] [address]  Osmolality, urine     Status: None   Collection Time: 01/19/21  7:33 PM  Result Value Ref Range   Osmolality, Ur 572 300 - 900 mOsm/kg    Comment: Performed at [facility], [address] [place], [place] [address]  Osmolality     Status: None   Collection Time: 01/19/21  7:34 PM  Result Value Ref Range   Osmolality 291 275 - 295 mOsm/kg    Comment: Performed at [facility], [address] [place], [place] [address]  CBG monitoring, ED     Status: Abnormal   Collection Time: 01/19/21  9:20 PM  Result Value Ref Range   Glucose-Capillary 196 (H) 70 - 99 mg/dL    Comment: Glucose reference range applies only to samples taken after fasting for at least 8 hours.  Heparin level (unfractionated)     Status: Abnormal   Collection Time: 01/19/21 11:48 PM  Result Value Ref Range   Heparin Unfractionated 1.15 (H) 0.30 - 0.70 IU/mL    Comment: (NOTE) If heparin results are below expected values, and patient dosage has  been confirmed, suggest follow up testing of antithrombin III levels. Performed at [facility], [address] [place], [place] [address]   Hemoglobin A1c     Status: Abnormal    Collection Time: 01/20/21  4:09 AM  Result Value Ref Range   Hgb A1c MFr Bld 8.1 (H) 4.8 - 5.6 %    Comment: (NOTE) Pre diabetes:          5.7%-6.4%  Diabetes:              >6.4%  Glycemic control for   <7.0% adults with diabetes    Mean Plasma Glucose 185.77 mg/dL    Comment: Performed at [facility] [address] [place], [place] [address]  Phosphorus     Status: None   Collection Time: 01/20/21  4:09 AM  Result Value Ref Range   Phosphorus 3.2 2.5 - 4.6 mg/dL    Comment: Performed at [facility], [address] [place], [place] [address]  Magnesium     Status: None   Collection Time: 01/20/21  4:09 AM  Result Value Ref Range   Magnesium 1.9 1.7 - 2.4 mg/dL    Comment: Performed at [facility], [address] [place], [place] [address]  Basic metabolic panel     Status: Abnormal   Collection Time: 01/20/21  4:09 AM  Result Value Ref Range   Sodium 136 135 - 145 mmol/L   Potassium 3.7 3.5 - 5.1 mmol/L   Chloride 101 98 - 111 mmol/L   CO2 27 22 - 32 mmol/L   Glucose, Bld 159 (H) 70 - 99 mg/dL    Comment: Glucose reference range applies only  to samples taken after fasting for at least 8 hours.   BUN 16 8 - 23 mg/dL   Creatinine, Ser 0.74 0.61 - 1.24 mg/dL   Calcium 8.5 (L) 8.9 - 10.3 mg/dL   GFR, Estimated >60 >60 mL/min    Comment: (NOTE) Calculated using the CKD-EPI Creatinine Equation (2021)    Anion gap 8 5 - 15    Comment: Performed at [facility], [address] [place], [place] [address]  CBC     Status: Abnormal   Collection Time: 01/20/21  4:09 AM  Result Value Ref Range   WBC 2.2 (L) 4.0 - 10.5 K/uL   RBC 2.98 (L) 4.22 - 5.81 MIL/uL   Hemoglobin 9.7 (L) 13.0 - 17.0 g/dL   HCT 27.7 (L) 39.0 - 52.0 %   MCV 93.0 80.0 - 100.0 fL   MCH 32.6 26.0 - 34.0 pg   MCHC 35.0 30.0 - 36.0 g/dL   RDW 20.4 (H) 11.5 - 15.5 %   Platelets 115 (L) 150 - 400 K/uL    Comment: Immature Platelet Fraction may be clinically indicated,  consider ordering this additional test [id number]    nRBC 0.9 (H) 0.0 - 0.2 %    Comment: Performed at [facility], [place], [place] [address]  Heparin level (unfractionated)     Status: Abnormal   Collection Time: 01/20/21  9:47 AM  Result Value Ref Range   Heparin Unfractionated 0.86 (H) 0.30 - 0.70 IU/mL    Comment: (NOTE) If heparin results are below expected values, and patient dosage has  been confirmed, suggest follow up testing of antithrombin III levels. Performed at [facility], [place], [place] [address]   Heparin level (unfractionated)     Status: None   Collection Time: 01/20/21  8:33 PM  Result Value Ref Range   Heparin Unfractionated 0.57 0.30 - 0.70 IU/mL    Comment: (NOTE) If heparin results are below expected values, and patient dosage has  been confirmed, suggest follow up testing of antithrombin III levels. Performed at [facility], [address] [place], [place] [address]   CBC     Status: Abnormal   Collection Time: 01/21/21  4:41 AM  Result Value Ref Range   WBC 2.4 (L) 4.0 - 10.5 K/uL   RBC 3.03 (L) 4.22 - 5.81 MIL/uL   Hemoglobin 9.7 (L) 13.0 - 17.0 g/dL   HCT 28.8 (L) 39.0 - 52.0 %   MCV 95.0 80.0 - 100.0 fL   MCH 32.0 26.0 - 34.0 pg   MCHC 33.7 30.0 - 36.0 g/dL   RDW 20.4 (H) 11.5 - 15.5 %   Platelets 125 (L) 150 - 400 K/uL    Comment: Immature Platelet Fraction may be clinically indicated, consider ordering this additional test [id number]    nRBC 0.0 0.0 - 0.2 %    Comment: Performed at [facility], [place], [place] [address]  Heparin level (unfractionated)     Status: None   Collection Time: 01/21/21  4:41 AM  Result Value Ref Range   Heparin Unfractionated 0.58 0.30 - 0.70 IU/mL    Comment: (NOTE) If heparin results are below expected values, and patient dosage has  been confirmed, suggest follow up testing of antithrombin III levels. Performed at  [facility], [address] [place], [place] [address]    CT CHEST ABDOMEN PELVIS W CONTRAST  Result Date: 01/19/2021 CLINICAL DATA:  Restaging post treatment  for base of tongue carcinoma. Distal esophageal carcinoma subsequently diagnosed. Chemotherapy and radiation therapy in January. Shortness of breath on exertion. EXAM: CT CHEST, ABDOMEN, AND PELVIS WITH CONTRAST TECHNIQUE: Multidetector CT imaging of the chest, abdomen and pelvis was performed following the standard protocol during bolus administration of intravenous contrast. CONTRAST:  160mL OMNIPAQUE IOHEXOL 300 MG/ML  SOLN COMPARISON:  PET-CT 10/25/2020.  Abdominopelvic CT 09/24/2013. FINDINGS: CT CHEST FINDINGS Cardiovascular: The pulmonary arteries are well opacified with contrast. There is extensive nearly occlusive pulmonary thromboembolic disease bilaterally with involvement of the main and lobar right and left pulmonary arteries. Thrombus in the left pulmonary artery measures up to 4.4 cm on image 30/2. There is evidence of right ventricular strain with the right ventricular lumen measuring 4.7 cm and the left ventricular lumen measuring 3.7 cm. Mild atherosclerosis of the aorta, great vessels and coronary arteries. Right IJ Port-A-Cath extends to the superior cavoatrial junction. Overall heart size is normal, and there is no pericardial fluid. Mediastinum/Nodes: There are no enlarged mediastinal, hilar or axillary lymph nodes. Small hiatal hernia with mild distal esophageal wall thickening, but no apparent focal mass lesion. The trachea and thyroid gland appear unremarkable. Lungs/Pleura: No pleural effusion or pneumothorax. Mild centrilobular emphysema. No evidence of pulmonary infarct, infiltrate or suspicious nodule. Musculoskeletal/Chest wall: No chest wall mass or suspicious osseous findings. CT ABDOMEN AND PELVIS FINDINGS Hepatobiliary: 4 mm low-density lesion in the left lobe on image 57/2 is similar to remote abdominal CT  and appears benign. No new or enlarging hepatic lesions. No significant biliary dilatation post cholecystectomy. Stable pneumobilia attributed to prior sphincterotomy. Pancreas: Unremarkable. No pancreatic ductal dilatation or surrounding inflammatory changes. Spleen: Normal in size without focal abnormality. Adrenals/Urinary Tract: Both adrenal glands appear normal. The kidneys appear normal without evidence of urinary tract calculus, suspicious lesion or hydronephrosis. No bladder abnormalities are seen. Stomach/Bowel: As above, distal esophageal wall thickening without apparent focal mass lesion. There is a small hiatal hernia. The proximal stomach is incompletely distended. The small bowel, appendix and colon demonstrate no significant findings. There are mild sigmoid colon diverticular changes. Vascular/Lymphatic: There are no enlarged abdominal or pelvic lymph nodes. Mild aortic and branch vessel atherosclerosis without acute vascular findings. The portal, superior mesenteric and splenic veins are patent. Reproductive: Mild enlargement of the prostate gland. Other: Stable asymmetric prominent fat in the right inguinal canal. Postsurgical changes in the anterior abdominal wall with stable areas of laxity containing fat. No ascites or peritoneal nodularity. Musculoskeletal: No acute or significant osseous findings. IMPRESSION: 1. Extensive nearly occlusive pulmonary thromboembolic disease bilaterally with CT evidence of right heart strain consistent with at least submassive (intermediate risk) PE. The presence of right heart strain has been associated with an increased risk of morbidity and mortality. Please refer to the "PE Focused" order set in EPIC. 2. No evidence of metastatic disease within the chest, abdomen or pelvis. Improvement in previously demonstrated distal esophageal wall thickening without apparent focal mass lesion. 3. Aortic Atherosclerosis (ICD10-I70.0) and Emphysema (ICD10-J43.9). 4. Critical  Value/emergent results were called by telephone at the time of interpretation on 01/19/2021 at 2:08 pm to provider [facility] , who verbally acknowledged these results. Electronically Signed   By: Richardean Sale M.D.   On: 01/19/2021 14:19   US Venous Img Lower Bilateral (DVT)  Result Date: 01/20/2021 CLINICAL DATA:  Pulmonary emboli. EXAM: BILATERAL LOWER EXTREMITY VENOUS DOPPLER ULTRASOUND TECHNIQUE: Gray-scale sonography with compression, as well as color and duplex ultrasound, were performed to evaluate the deep venous  system(s) from the level of the common femoral vein through the popliteal and proximal calf veins. COMPARISON:  None. FINDINGS: VENOUS On the left, normal compressibility of the common femoral, superficial femoral, and popliteal veins, as well as the visualized calf veins. Visualized portions of profunda femoral vein and great saphenous vein unremarkable. No filling defects to suggest DVT on grayscale or color Doppler imaging. Doppler waveforms show normal direction of venous flow, normal respiratory phasicity and response to augmentation. On the right, occlusive hypoechoic thrombus in the deep femoral vein extending to the junction with the femoral vein. The common femoral, femoral, and popliteal veins remain patent. Visualized calf veins are patent. OTHER None. Limitations: none IMPRESSION: 1. POSITIVE for occlusive DVT in the right deep femoral vein. Electronically Signed   By: Lucrezia Europe M.D.   On: 01/20/2021 11:46    Assessment:  Sean Garner. is a 75 y.o. 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   PET scanon 10/25/2020 revealed right tongue base primary with ipsilateral level 2 nodal metastasis. There were no findings of extracervical metastatic disease. There was focal hypermetabolismin the distal  esophagus, with possible concurrent soft tissue density lesion.  There was vague right lower lobe low-level hypermetabolism and ground-glass nodularity, favored minimal infection or aspiration.  Hereceived 6 weeks ofcisplatinwithconcurrent radiation(11/20/2020 - 12/27/2020). He completed radiation on 01/11/2021.  Chest, abdomen and pelvis CT on 01/19/2021 revealed extensive nearly occlusive pulmonary thromboembolic disease bilaterally with CT evidence of right heart strain consistent with at least a submassive PE.  There is no evidence of metastatic disease in the chest, abdomen and pelvis.  There was improvement in the distal esophageal wall thickening without apparent focal lesion.  Bilateral lower extremity duplex on 01/20/2021 revealed occlusive hypoechoic thrombus in the deep right femoral vein extending to the junction with the femoral vein.  The common femoral, femoral and popliteal veins remain patent.  Calf veins remain patent.  He has B12 deficiency and is on oral B12.  He has PTSDand clastrophobia.   Symptomatically, he is doing well. He denies any shortness of breath except with exertion.  He denies any chest pain or hemoptysis.  He is off oxygen.  Exam reveals subtle right lower extremity edema.  Plan:   1.   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             He will undergo outpatient PET scan on 04/10/2021.             Clinically, his neck adenopathy has nearly resolved. 2.   Stage IB esophageal carcinoma             Imaging studies reveal improvement in the distal esophageal wall thickening.             Patient is scheduled to follow-up with Dr [facility]ur[facility]bigail [facility]ts [facility].  Contact Dawayne Patricia, nurse navigator, tomorrow regaurding scheduling. 3.   Pancytopenia             Hematocrit 32.2.  Hemoglobin 11.2.  MCV 92.3.  WBC 3300.  Platelets 126,000 on 01/19/2021.             Hematocrit 27.7.  Hemoglobin   9.7.  MCV 93.0.  WBC  2200.  Platelets 115,000  on 01/20/2021.  Hematocrit 28.8.  Hemoglobin   9.7.  MCV 95.0.  WBC 2400.  Platelets 25,000 on 01/21/2021.             Etiology secondary to chemotherapy.             Patient denies any bleeding.                         Ferritin 424 with an iron saturation of 43% on 01/08/2021.             He is on oral B12 for B12 deficiency.             Continue to monitor daily while an inpatient..    4.   Bilateral pulmonary emboli and proximal RLE DVT             Clinically, he continues to do well             Etiology likely secondary to malignancy.                         Limited hypercoagulable work-up sent today.             Patient currently on heparin.             As patient doing well anticipate no need for thrombectomy or thrombolysis.             Patient with a proximal RLE DVT.               Follow-up with vascular surgery regarding timing of conversion to Eliquis or Xarelto. 5.   Disposition             Follow-up with vascular surgery.             Anticipate discharge on Eliquis or Xarelto today or tomorrow per vascular surgery.   Lequita Asal, MD  01/21/2021, 10:34 AM

## 2021-01-22 ENCOUNTER — Telehealth: Payer: Self-pay

## 2021-01-22 DIAGNOSIS — I2602 Saddle embolus of pulmonary artery with acute cor pulmonale: Secondary | ICD-10-CM

## 2021-01-22 DIAGNOSIS — I2699 Other pulmonary embolism without acute cor pulmonale: Secondary | ICD-10-CM | POA: Diagnosis not present

## 2021-01-22 DIAGNOSIS — R0600 Dyspnea, unspecified: Secondary | ICD-10-CM

## 2021-01-22 DIAGNOSIS — I82409 Acute embolism and thrombosis of unspecified deep veins of unspecified lower extremity: Secondary | ICD-10-CM | POA: Diagnosis not present

## 2021-01-22 LAB — GLUCOSE, CAPILLARY
Glucose-Capillary: 138 mg/dL — ABNORMAL HIGH (ref 70–99)
Glucose-Capillary: 223 mg/dL — ABNORMAL HIGH (ref 70–99)
Glucose-Capillary: 160 mg/dL — ABNORMAL HIGH (ref 70–99)
Glucose-Capillary: 282 mg/dL — ABNORMAL HIGH (ref 70–99)
Glucose-Capillary: 174 mg/dL — ABNORMAL HIGH (ref 70–99)
Glucose-Capillary: 170 mg/dL — ABNORMAL HIGH (ref 70–99)
Glucose-Capillary: 207 mg/dL — ABNORMAL HIGH (ref 70–99)
Glucose-Capillary: 172 mg/dL — ABNORMAL HIGH (ref 70–99)
Glucose-Capillary: 177 mg/dL — ABNORMAL HIGH (ref 70–99)
Glucose-Capillary: 260 mg/dL — ABNORMAL HIGH (ref 70–99)
Glucose-Capillary: 214 mg/dL — ABNORMAL HIGH (ref 70–99)
Glucose-Capillary: 220 mg/dL — ABNORMAL HIGH (ref 70–99)
Glucose-Capillary: 205 mg/dL — ABNORMAL HIGH (ref 70–99)

## 2021-01-22 LAB — HEPARIN LEVEL (UNFRACTIONATED): Heparin Unfractionated: 0.44 IU/mL (ref 0.30–0.70)

## 2021-01-22 MED ORDER — APIXABAN (ELIQUIS) VTE STARTER PACK (10MG AND 5MG): ORAL_TABLET | ORAL | 0 refills | Status: DC

## 2021-01-22 MED ORDER — APIXABAN 5 MG PO TABS: 5.0000 mg | ORAL_TABLET | Freq: Two times a day (BID) | ORAL | Status: DC

## 2021-01-22 MED ORDER — APIXABAN 5 MG PO TABS
10.0000 mg | ORAL_TABLET | Freq: Two times a day (BID) | ORAL | Status: DC
  Administered 2021-01-22: 10 mg via ORAL
  Filled 2021-01-22: qty 2

## 2021-01-22 NOTE — Discharge Instructions (Signed)
WHY WAS ELIQUIS PRESCRIBED FOR YOU? Eliquis was prescribed to treat blood clots that may have been found in the veins of your legs (deep vein thrombosis) or in your lungs (pulmonary embolism) and to reduce the risk of them occurring again. WHAT DO YOU NEED TO KNOW ABOUT ELIQUIS ? The starting dose is 10 mg (two 5 mg tablets) taken TWICE daily for the FIRST SEVEN (7) DAYS, then on (enter date) __3/14/2022__the dose is reduced to ONE 5 mg tablet taken TWICE daily. Eliquis may be taken with or without food. Try to take the dose about the same time in the morning and in the evening. If you have difficulty swallowing the tablet whole please discuss with your pharmacist how to take the medication safely. Take Eliquis exactly as prescribed and DO NOT stop taking Eliquis without talking to the doctor who prescribed the medication. Stopping may increase your risk of developing a new blood clot. Refill your prescription before you run out. After discharge, you should have regular check-up appointments with your healthcare provider that is prescribing your Eliquis. WHAT DO YOU DO IF YOU MISS A DOSE? If a dose of ELIQUIS is not taken at the scheduled time, take it as soon as possible on the same day and twice-daily administration should be resumed. The dose should not be doubled to make up for a missed dose. IMPORTANT SAFETY INFORMATION A possible side effect of Eliquis is bleeding. You should call your healthcare provider right away if you experience any of the following: ? Bleeding from an injury or your nose that does not stop. ? Unusual colored urine (red or dark brown) or unusual colored stools (red or black). ? Unusual bruising for unknown reasons. ? A serious fall or if you hit your head (even if there is no bleeding). Some medicines may interact with Eliquis and might increase your risk of bleeding or clotting while on Eliquis. To help avoid this, consult your healthcare provider  or pharmacist prior to using any new prescription or non-prescription medications, including herbals, vitamins, non-steroidal anti-inflammatory drugs (NSAIDs) and supplements. This website has more information on Eliquis (apixaban): [email or web address].no.

## 2021-01-22 NOTE — Progress Notes (Signed)
Patient IV removed. Site clean dry and in tact. Telemetry d/c and CCMD notified. Vitals updated. See flowsheet for VSS. Patient stable at this time. MD notified via secure chat. Patient and spouse educated on new medication regimen and instructed on when to take next dose of eliquis. Reviewed discharge instructions and medications w patient. Patient escorted by volunteer services via wheelchair. Patient denies further questions at this time. All personal belongings with patient and spouse.

## 2021-01-22 NOTE — TOC Initial Note (Signed)
Transition of Care (TOC) - Initial/Assessment Note    Patient Details  Name: Sean Garner. MRN: [id number] Date of Birth: [birth year]  Transition of Care [facility]) CM/SW Contact:    Eileen Stanford, LCSW Phone Number: 01/22/2021, 10:27 AM  Clinical Narrative:   Pt alert and oriented. Pt states he lives with his spouse. Pt states he is agreeable to [facility] however [place] and would like for them to call him Monday to get it arranged. Pt did not have a agency preference. Pt has VA coverage. [facility] accepts [facility] coverage. Referral provided to [facility] --pt aware and agreeable. Pt states he doesn't need any equipment at home. Pt states he is very appreciative of help. Pt states his wife takes him to his appointments. Pt states he didn't have any in home services prior to admission. [facility] [address] in [place] with no difficulties getting his meds. Pt states he anticipates a dc today.                Expected Discharge Plan: [place] Barriers to Discharge: Continued Medical Work up   Patient Goals and CMS Choice Patient states their goals for this hospitalization and ongoing recovery are:: to go home   Choice offered to / list presented to : Patient  Expected Discharge Plan and Services Expected Discharge Plan: [place] In-house Referral: NA   Post Acute Care Choice: [place] arrangements for the past 2 months: [place]: PT HH Agency: [place] ([place]) Date HH Agency Contacted: 01/22/21 Time HH Agency Contacted: 38 Representative spoke with at [facility]  Prior Living Arrangements/Services Living arrangements for the past 2 months: [place] with:: Spouse Patient language and need for interpreter reviewed:: Yes Do you feel safe going back to the place where you live?: Yes      Need for  Family Participation in Patient Care: Yes (Comment) Care giver support system in place?: Yes (comment)   Criminal Activity/Legal Involvement Pertinent to Current Situation/Hospitalization: No - Comment as needed  Activities of Daily Living Home Assistive Devices/Equipment: Blood pressure cuff,Built-in shower seat,CBG Meter,Eyeglasses,Grab bars in shower ADL Screening (condition at time of admission) Patient's cognitive ability adequate to safely complete daily activities?: Yes Is the patient deaf or have difficulty hearing?: No Does the patient have difficulty seeing, even when wearing glasses/contacts?: No Does the patient have difficulty concentrating, remembering, or making decisions?: No Patient able to express need for assistance with ADLs?: Yes Does the patient have difficulty dressing or bathing?: No Independently performs ADLs?: Yes (appropriate for developmental age) Does the patient have difficulty walking or climbing stairs?: No Weakness of Legs: None Weakness of Arms/Hands: None  Permission Sought/Granted Permission sought to share information with : Family Supports    Share Information with NAME: Enid Derry  Permission granted to share info w AGENCY: [facility]  Permission granted to share info w Relationship: spouse     Emotional Assessment Appearance:: Appears stated age Attitude/Demeanor/Rapport: Engaged Affect (typically observed): Accepting,Appropriate Orientation: : Oriented to Self,Oriented to Place,Oriented to  Time,Oriented to Situation Alcohol / Substance Use: Not Applicable Psych Involvement: No (comment)  Admission diagnosis:  Pulmonary embolism, bilateral (HCC) [I26.99] Acute saddle pulmonary embolism with acute  cor pulmonale ([place]) [I26.02] Patient Active Problem List   Diagnosis Date Noted  . Pulmonary embolism, bilateral ([place]) 01/19/2021  . SOB (shortness of breath) 01/19/2021  . Elevated troponin 01/19/2021  . Pancytopenia ([place]) 01/19/2021  . Goals  of care, counseling/discussion 01/18/2021  . B12 deficiency 01/10/2021  . Bradycardia 01/10/2021  . Dehydration 01/08/2021  . Glaucoma 01/01/2021  . Exposure to Agent Surgisite [place] 01/01/2021  . Diabetes mellitus ([place]) 01/01/2021  . High risk medication use 01/01/2021  . GAD (generalized anxiety disorder) 01/01/2021  . 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 Hypocalcemia 12/15/2020  . Hypomagnesemia 12/13/2020  . Hyponatremia 11/28/2020  . Anxiety 11/27/2020  . Encounter for antineoplastic chemotherapy 11/19/2020  . Esophageal cancer, stage IB ([place]) 11/16/2020  . Chronic post-traumatic stress disorder (PTSD) 10/24/2020  . Carcinoma of base of tongue ([place]) 10/17/2020  . Benign essential hypertension 08/08/2014  . GERD (gastroesophageal reflux disease) 08/08/2014  . Other and unspecified hyperlipidemia 08/08/2014  . Type 2 diabetes mellitus without complications ([place]) 50/07/3817  . Knee pain 09/09/2013  . Osteoarthritis of right knee 09/09/2013   PCP:  Derinda Late, MD Pharmacy:   [id number] [address], [place] [address] Phone: [phone] Fax: [phone]     Social Determinants of Health (SDOH) Interventions    Readmission Risk Interventions Readmission Risk Prevention Plan 01/22/2021  Transportation Screening Complete  PCP or Specialist Appt within 3-5 Days Complete  HRI or [place] Complete  Social Work Consult for [place] Planning/Counseling Complete  Palliative Care Screening Not Applicable  Medication Review Press photographer) Complete  Some recent data might be hidden

## 2021-01-22 NOTE — Progress Notes (Signed)
[facility]  Daily Progress Note   Subjective  -   Patient feels well.  Wants to go home.  No shortness of breath.  Has been walking in the halls without difficulty.  No hypoxia.  Objective Vitals:   01/21/21 1920 01/22/21 0100 01/22/21 0426 01/22/21 1108  BP: 108/77  (!) 119/54 (!) 107/58  Pulse: 66  61 67  Resp: 19  18 18   Temp: 97.8 F (36.6 C)  98.2 F (36.8 C) 98.1 F (36.7 C)  TempSrc: Oral     SpO2: 100%  98% 94%  Weight:  85.6 kg    Height:        Intake/Output Summary (Last 24 hours) at 01/22/2021 1244 Last data filed at 01/22/2021 1206 Gross per 24 hour  Intake 720 ml  Output 2050 ml  Net -1330 ml    PULM  CTAB CV  RRR   Laboratory CBC    Component Value Date/Time   WBC 2.4 (L) 01/21/2021 0441   HGB 9.7 (L) 01/21/2021 0441   HGB 12.3 (L) 10/01/2013 0508   HCT 28.8 (L) 01/21/2021 0441   HCT 35.7 (L) 10/01/2013 0508   PLT 125 (L) 01/21/2021 0441   PLT 302 10/01/2013 0508    BMET    Component Value Date/Time   NA 136 01/20/2021 0409   NA 135 (L) 09/29/2013 0534   K 3.7 01/20/2021 0409   K 3.8 09/29/2013 0534   CL 101 01/20/2021 0409   CL 101 09/29/2013 0534   CO2 27 01/20/2021 0409   CO2 29 09/29/2013 0534   GLUCOSE 159 (H) 01/20/2021 0409   GLUCOSE 258 (H) 09/29/2013 0534   BUN 16 01/20/2021 0409   BUN 12 09/29/2013 0534   CREATININE 0.74 01/20/2021 0409   CREATININE 1.15 09/29/2013 0534   CALCIUM 8.5 (L) 01/20/2021 0409   CALCIUM 8.7 09/29/2013 0534   GFRNONAA >60 01/20/2021 0409   GFRNONAA >60 09/29/2013 0534   GFRAA >60 09/29/2013 0534    Assessment/Planning:    Pulmonary embolus.  Doing well with anticoagulation.  Not hypoxic or short of breath and has good exercise tolerance.  No role for throat ectomy at this time.  I am okay to switch him over to oral anticoagulation such as Eliquis and he can be discharged home on Eliquis from my standpoint.  I am happy to see him in the office in a few weeks to discuss  duration of anticoagulation if desired.  Tongue cancer.  Follows with oncology.  Had a port placed in the past couple of months and it can be used for IV access.    Leotis Pain  01/22/2021, 12:44 PM

## 2021-01-22 NOTE — Telephone Encounter (Addendum)
Secure chat received from Dr. Gwynneth Albright and Dr. Fletcher Anon.  Dr. Gwynneth Albright "Dr. Fletcher Anon, this pt had b/l PE, RV dysfunction on ct. tp mildly elevated, demand ischemia. I ordered echo pending. However he is stable to be discharged home. Can I have him follow-up with you as outpatient for reevaluation of his RV and get the echo. A??"  Dr. Fletcher Anon "Lattie Haw, can you schedule this patient for an echo in our office for dyspnea and PE?"  Order placed for echo and msg fwd to scheduling to contact the patient to schedule the echo and f/u after (new pt)

## 2021-01-22 NOTE — Progress Notes (Signed)
ANTICOAGULATION CONSULT NOTE  Pharmacy Consult for Heparin drip Indication: pulmonary embolus  No Known Allergies  Patient Measurements: Height: 5\' 10"  (177.8 cm) Weight: 85.6 kg (188 lb 12.8 oz) IBW/kg (Calculated) : 73 Heparin Dosing Weight: 86  Vital Signs: Temp: 98.2 F (36.8 C) (03/07 0426) Temp Source: Oral (03/06 1920) BP: 119/54 (03/07 0426) Pulse Rate: 61 (03/07 0426)  Labs: Recent Labs    01/19/21 1459 01/19/21 1825 01/19/21 2348 01/20/21 0409 01/20/21 0947 01/20/21 2033 01/21/21 0441 01/22/21 0409  HGB 11.2*  --   --  9.7*  --   --  9.7*  --   HCT 32.2*  --   --  27.7*  --   --  28.8*  --   PLT 126*  --   --  115*  --   --  125*  --   APTT 26  --   --   --   --   --   --   --   LABPROT 14.1  --   --   --   --   --   --   --   INR 1.1  --   --   --   --   --   --   --   HEPARINUNFRC  --   --    < >  --    < > 0.57 0.58 0.44  CREATININE 0.81  --   --  0.74  --   --   --   --   TROPONINIHS 23* 21*  --   --   --   --   --   --    < > = values in this interval not displayed.    Estimated Creatinine Clearance: 83.6 mL/min (by C-G formula based on SCr of 0.74 mg/dL).   Medical History: Past Medical History:  Diagnosis Date  . Anxiety   . 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 Cataract    lt eye repair; present in rt eye but not ripe yet.  . Claustrophobia   . Diabetes mellitus without complication ([place])   . GERD (gastroesophageal reflux disease)   . Glaucoma    using gtts for this.  . Hypercholesterolemia   . Hypertension     Medications:  Scheduled:  . brimonidine  1 drop Left Eye Daily   And  . timolol  1 drop Left Eye Daily  . Chlorhexidine Gluconate Cloth  6 each Topical Daily  . citalopram  20 mg Oral Daily  . feeding supplement  237 mL Oral BID BM  . fluticasone  2 spray Each Nare Daily  . insulin aspart  0-9 Units Subcutaneous TID WC  . pantoprazole  40 mg Oral Daily  . simvastatin  40 mg Oral q1800    Infusions:  . heparin 1,100 Units/hr (01/22/21 0513)    Assessment: 75 yo M to start Heparin drip for PE. No anticoag PTA  per Med Rec Hgb 11.2>9.7  plt 126>115  INR 1.1  APTT 26  3/4:  HL @ 8144 = 1.15 Phlebotomist confirmed level was drawn from opposite arm that heparin was infusing so this level is valid. Will hold heparin drip for 1 hr and restart @ 1250 units/hr. 3/5:  HL @ 0947 = 0.86 (resulted at 1205), Supratherapeutic. Will decrease drip rate to 1100 units/hr. Will recheck HL 8 hrst.   Goal of Therapy:  Heparin level 0.3-0.7 units/ml Monitor platelets by  anticoagulation protocol: Yes   Plan:  3/7:  HL @ 0409 = 0.44 Will continue pt on current rate and recheck HL on 3/8 with AM labs.   Robbins,Jason D, PharmD 01/22/2021,6:10 AM

## 2021-01-22 NOTE — Telephone Encounter (Signed)
pt wife called front desk. they sent me a message to call patient wife.

## 2021-01-22 NOTE — Discharge Summary (Signed)
Sean Garner. WJX:[id number] DOB: 1945/11/23 DOA: 01/19/2021  PCP: Sean Late, MD  Admit date: 01/19/2021 Discharge date: 01/22/2021  Admitted From: Home  Disposition: Home  Recommendations for Outpatient Follow-up:  1. Follow up with PCP in 1 week 2. Please obtain BMP/CBC in one week 3. Follow-up with Dr. Mike Garner on 3/10 at 11:15 AM 4. F/u with Cardiology in few days for echocardiogram  Home Health: Yes   Discharge Condition:Stable CODE STATUS: Full Diet recommendation: Heart Healthy / Carb Modified  Brief/Interim Summary: Per Sean Garner. is a 75 y.o. 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, distal esophageal carcinoma, hypertension, hyperlipidemia, type 2 diabetes mellitus, chronic hyponatremia with baseline serum sodium range of 1 25-1 32, who is admitted to [facility] on 01/19/2021 with acute bilateral pulmonary emboli after presenting from home to [facility] ED for further evaluation of acute bilateral pulmonary emboli that were incidentally found on outpatient CT chest abdomen/pelvis earlier in the dayThe patient carries a diagnosis of squamous cell cancer of the tongue status post chemotherapy as well as 35 rounds of radiation, with ensuing diagnosis of distal esophageal carcinoma.  Outpatient CT chest, abdomen, pelvis was ordered for the purpose of restaging patient's distal esophageal carcinoma.  The CT scan was performed earlier on the day of admission and demonstrated extensive bilateral pulmonary emboli with CT evidence of right heart strain consistent with submassive pulmonary emboli, but showed no evidence of metastatic disease in the chest, abdomen, pelvis, as well as interval improvement in prior demonstrated distal esophageal wall thickening without apparent focal mass lesion.  Subsequently, the patient was contacted by his oncologist with the above results, and associated  instructions to present to the local emergency department for further evaluation and management of new diagnosis of bilateral pulmonary emboli.  Hematology and vascular surgery were consulted.  Vascular surgery did not feel patient needed thrombectomy.   #)Acute bilateral pulmonary emboli: No evidence of associated hypotension to warrant consideration for TPA administrationat this time. Appears to be patient's first PE/DVT.Appears to be provoked in the setting of the presence of hypercoagulable state on the basis of squamous cell cancer of the tongue followed by distal esophageal carcinoma,without additional DVT/PE risk factors identified at this time. venous US b/l -POSITIVE for occlusive DVT in the right deep femoral vein Hematology and vascular surgery following Per hematology likely secondary to malignancy Limited hypercoagulable work up sent per hematology Switch IV heparin drip to p.o. Eliquis today Hemodynamically stable Ambulated with PT and with family remains asymptomatic Per vascular surgery no indication for thrombectomy at this time.  Okay to be switched to p.o. Eliquis and discharged home.     #) Elevated troponin:mildly elevated.   Troponin 21. Likely 2/2 demand ischamia. Cp free Echo do be done as outpatient. Will setup to f/u with  cardiology for evaluation of RV/echo.      #) Acute hypoxic respiratory distress: in the context of no known baseline supplemental oxygen requirements, presenting O2 satnoted to be 88% on room air, which is improved to 100% on 2 L nasal cannula Improved .  On RA now 97%  #) Pancytopenia:In the context of recent chemotherapy Cbc monitored while in the hospital remained stable. Will need to follow-up with hematology as mentioned above as outpatient     #) Essential hypertension: On losartan as outpatient We will hold as patient's blood pressure have been normotensive to low.    #) GERD:  Continue  PPI      #) Type 2 diabetes mellitus: A1c 8.1 Continue outpatient meds    #) Generalized anxiety disorder:  Continue celexa     #) Allergic rhinitis: On Flonase as an outpatient.      #)hyponatremia:  Secondary to hypovolemia. Resolved  Discharge Diagnoses:  Principal Problem:   Pulmonary embolism, bilateral ([place]) Active Problems:   Benign essential hypertension   GERD (gastroesophageal reflux disease)   Type 2 diabetes mellitus without complications (HCC)   Anxiety   Hyponatremia   SOB (shortness of breath)   Elevated troponin   Pancytopenia (HCC)    Discharge Instructions  Discharge Instructions    Diet - low sodium heart healthy   Complete by: As directed    Discharge instructions   Complete by: As directed    DO not take your blood pressure medication until your blood pressure is higher. Check with your pcp or oncologist. Take your decadron as before instructed by your oncologist Taking Eliquis your blood thinner every 12 hrs. DO NOT climb ladders , machinery, due to risk of falls  Stop aspirin, do not take ibuprofen , NSAIDS.   Increase activity slowly   Complete by: As directed      Allergies as of 01/22/2021   No Known Allergies     Medication List    STOP taking these medications   losartan 100 MG tablet Commonly known as: COZAAR   prochlorperazine 10 MG tablet Commonly known as: COMPAZINE     TAKE these medications   ALPRAZolam 0.25 MG tablet Commonly known as: XANAX Take 1 tablet (0.25 mg total) by mouth at bedtime as needed for anxiety.   Apixaban Starter Pack (10mg  and 5mg ) Commonly known as: ELIQUIS STARTER PACK Take as directed on package: start with two-5mg  tablets twice daily for 7 days. On day 8 (01/29/2021), switch to one-5mg  tablet twice daily.   calcium carbonate 1500 (600 Ca) MG Tabs tablet Commonly known as: OSCAL Take by mouth 2 (two) times daily with a meal.   citalopram 20 MG tablet Commonly  known as: CELEXA Take 1.5 tablets (30 mg total) by mouth daily with breakfast.   Combigan 0.2-0.5 % ophthalmic solution Generic drug: brimonidine-timolol Place 1 drop into the left eye daily.   dexamethasone 4 MG tablet Commonly known as: DECADRON 2 TABS BY MOUTH DAILY. TAKE DAILY X 3 DAYS STARTING THE DAY AFTER CHEMOTHERAPY. TAKE WITH FOOD. What changed: Another medication with the same name was removed. Continue taking this medication, and follow the directions you see here.   esomeprazole 20 MG capsule Commonly known as: NEXIUM Take 40 mg by mouth daily at 12 noon.   fluticasone 50 MCG/ACT nasal spray Commonly known as: FLONASE Place into both nostrils.   glipiZIDE 5 MG 24 hr tablet Commonly known as: GLUCOTROL XL Take 5 mg by mouth 2 (two) times daily.   glucose blood test strip   lidocaine-prilocaine cream Commonly known as: EMLA Apply to affected area once   magic mouthwash w/lidocaine Soln Take 5 mLs by mouth.   metFORMIN 500 MG 24 hr tablet Commonly known as: GLUCOPHAGE-XR Take 500 mg by mouth 2 (two) times daily. Takes 2 tablets and 1 tablet in the pm   ondansetron 8 MG tablet Commonly known as: Zofran Take 1 tablet (8 mg total) by mouth 2 (two) times daily as needed. Start on the third day after cisplatin chemotherapy.   simvastatin 40 MG tablet Commonly known as: ZOCOR Take 40 mg by  mouth daily at 6 PM.   sucralfate 1 g tablet Commonly known as: Carafate Take 0.5 tablets (0.5 g total) by mouth 3 (three) times daily. Dissolve in warm water, swish and swallow   traZODone 50 MG tablet Commonly known as: DESYREL Take 0.5-1 tablets (25-50 mg total) by mouth at bedtime as needed for sleep.   VITAMIN D PO Take 800 Units by mouth daily.       Follow-up Information    Lequita Asal, MD Follow up in 1 week(s).   Specialty: Hematology and Oncology Why: on 3/10 @ 11:15am Contact information: [place]  [address] [phone]        Sean Late, MD Follow up in 1 week(s).   Specialty: Family Medicine Contact information: [address] [facility] [place] [address] [phone]        Wellington Hampshire, MD Follow up in 1 week(s).   Specialty: Cardiology Why: needs echo. Contact information: [place] [address] [phone]              No Known Allergies  Consultations:  Vascular surgery, hematology   Procedures/Studies: CT CHEST ABDOMEN PELVIS W CONTRAST  Result Date: 01/19/2021 CLINICAL DATA:  Restaging post treatment for base of tongue carcinoma. Distal esophageal carcinoma subsequently diagnosed. Chemotherapy and radiation therapy in January. Shortness of breath on exertion. EXAM: CT CHEST, ABDOMEN, AND PELVIS WITH CONTRAST TECHNIQUE: Multidetector CT imaging of the chest, abdomen and pelvis was performed following the standard protocol during bolus administration of intravenous contrast. CONTRAST:  166mL OMNIPAQUE IOHEXOL 300 MG/ML  SOLN COMPARISON:  PET-CT 10/25/2020.  Abdominopelvic CT 09/24/2013. FINDINGS: CT CHEST FINDINGS Cardiovascular: The pulmonary arteries are well opacified with contrast. There is extensive nearly occlusive pulmonary thromboembolic disease bilaterally with involvement of the main and lobar right and left pulmonary arteries. Thrombus in the left pulmonary artery measures up to 4.4 cm on image 30/2. There is evidence of right ventricular strain with the right ventricular lumen measuring 4.7 cm and the left ventricular lumen measuring 3.7 cm. Mild atherosclerosis of the aorta, great vessels and coronary arteries. Right IJ Port-A-Cath extends to the superior cavoatrial junction. Overall heart size is normal, and there is no pericardial fluid. Mediastinum/Nodes: There are no enlarged mediastinal, hilar or axillary lymph nodes. Small hiatal hernia with mild distal esophageal  wall thickening, but no apparent focal mass lesion. The trachea and thyroid gland appear unremarkable. Lungs/Pleura: No pleural effusion or pneumothorax. Mild centrilobular emphysema. No evidence of pulmonary infarct, infiltrate or suspicious nodule. Musculoskeletal/Chest wall: No chest wall mass or suspicious osseous findings. CT ABDOMEN AND PELVIS FINDINGS Hepatobiliary: 4 mm low-density lesion in the left lobe on image 57/2 is similar to remote abdominal CT and appears benign. No new or enlarging hepatic lesions. No significant biliary dilatation post cholecystectomy. Stable pneumobilia attributed to prior sphincterotomy. Pancreas: Unremarkable. No pancreatic ductal dilatation or surrounding inflammatory changes. Spleen: Normal in size without focal abnormality. Adrenals/Urinary Tract: Both adrenal glands appear normal. The kidneys appear normal without evidence of urinary tract calculus, suspicious lesion or hydronephrosis. No bladder abnormalities are seen. Stomach/Bowel: As above, distal esophageal wall thickening without apparent focal mass lesion. There is a small hiatal hernia. The proximal stomach is incompletely distended. The small bowel, appendix and colon demonstrate no significant findings. There are mild sigmoid colon diverticular changes. Vascular/Lymphatic: There are no enlarged abdominal or pelvic lymph nodes. Mild aortic and branch vessel atherosclerosis  without acute vascular findings. The portal, superior mesenteric and splenic veins are patent. Reproductive: Mild enlargement of the prostate gland. Other: Stable asymmetric prominent fat in the right inguinal canal. Postsurgical changes in the anterior abdominal wall with stable areas of laxity containing fat. No ascites or peritoneal nodularity. Musculoskeletal: No acute or significant osseous findings. IMPRESSION: 1. Extensive nearly occlusive pulmonary thromboembolic disease bilaterally with CT evidence of right heart strain consistent with  at least submassive (intermediate risk) PE. The presence of right heart strain has been associated with an increased risk of morbidity and mortality. Please refer to the "PE Focused" order set in EPIC. 2. No evidence of metastatic disease within the chest, abdomen or pelvis. Improvement in previously demonstrated distal esophageal wall thickening without apparent focal mass lesion. 3. Aortic Atherosclerosis (ICD10-I70.0) and Emphysema (ICD10-J43.9). 4. Critical Value/emergent results were called by telephone at the time of interpretation on 01/19/2021 at 2:08 pm to provider [facility] , who verbally acknowledged these results. Electronically Signed   By: Richardean Sale M.D.   On: 01/19/2021 14:19   US Venous Img Lower Bilateral (DVT)  Result Date: 01/20/2021 CLINICAL DATA:  Pulmonary emboli. EXAM: BILATERAL LOWER EXTREMITY VENOUS DOPPLER ULTRASOUND TECHNIQUE: Gray-scale sonography with compression, as well as color and duplex ultrasound, were performed to evaluate the deep venous system(s) from the level of the common femoral vein through the popliteal and proximal calf veins. COMPARISON:  None. FINDINGS: VENOUS On the left, normal compressibility of the common femoral, superficial femoral, and popliteal veins, as well as the visualized calf veins. Visualized portions of profunda femoral vein and great saphenous vein unremarkable. No filling defects to suggest DVT on grayscale or color Doppler imaging. Doppler waveforms show normal direction of venous flow, normal respiratory phasicity and response to augmentation. On the right, occlusive hypoechoic thrombus in the deep femoral vein extending to the junction with the femoral vein. The common femoral, femoral, and popliteal veins remain patent. Visualized calf veins are patent. OTHER None. Limitations: none IMPRESSION: 1. POSITIVE for occlusive DVT in the right deep femoral vein. Electronically Signed   By: Lucrezia Europe M.D.   On: 01/20/2021 11:46       Subjective: No cp , sob with ambulation  Discharge Exam: Vitals:   01/22/21 1108 01/22/21 1517  BP: (!) 107/58 124/88  Pulse: 67 63  Resp: 18 18  Temp: 98.1 F (36.7 C) 98 F (36.7 C)  SpO2: 94% 97%   Vitals:   01/22/21 0100 01/22/21 0426 01/22/21 1108 01/22/21 1517  BP:  (!) 119/54 (!) 107/58 124/88  Pulse:  61 67 63  Resp:  18 18 18   Temp:  98.2 F (36.8 C) 98.1 F (36.7 C) 98 F (36.7 C)  TempSrc:   Oral Oral  SpO2:  98% 94% 97%  Weight: 85.6 kg     Height:        General: Pt is alert, awake, not in acute distress Cardiovascular: RRR, S1/S2 +, no rubs, no gallops Respiratory: CTA bilaterally, no wheezing, no rhonchi Abdominal: Soft, NT, ND, bowel sounds + Extremities: trace edema RLE    The results of significant diagnostics from this hospitalization (including imaging, microbiology, ancillary and laboratory) are listed below for reference.     Microbiology: Recent Results (from the past 240 hour(s))  Resp Panel by RT-PCR (Flu A&B, Covid) Nasopharyngeal Swab     Status: None   Collection Time: 01/19/21  2:59 PM   Specimen: Nasopharyngeal Swab; Nasopharyngeal(NP) swabs in vial transport medium  Result  Value Ref Range Status   SARS Coronavirus 2 by RT PCR NEGATIVE NEGATIVE Final    Comment: (NOTE) SARS-CoV-2 target nucleic acids are NOT DETECTED.  The SARS-CoV-2 RNA is generally detectable in upper respiratory specimens during the acute phase of infection. The lowest concentration of SARS-CoV-2 viral copies this assay can detect is 138 copies/mL. A negative result does not preclude SARS-Cov-2 infection and should not be used as the sole basis for treatment or other patient management decisions. A negative result may occur with  improper specimen collection/handling, submission of specimen other than nasopharyngeal swab, presence of viral mutation(s) within the areas targeted by this assay, and inadequate number of viral copies(<138 copies/mL). A  negative result must be combined with clinical observations, patient history, and epidemiological information. The expected result is Negative.  Fact Sheet for Patients:  EntrepreneurPulse.com.au  Fact Sheet for Healthcare Providers:  IncredibleEmployment.be  This test is no t yet approved or cleared by the [place] FDA and  has been authorized for detection and/or diagnosis of SARS-CoV-2 by FDA under an Emergency Use Authorization (EUA). This EUA will remain  in effect (meaning this test can be used) for the duration of the COVID-19 declaration under Section 564(b)(1) of the Act, 21 U.S.C.section 360bbb-3(b)(1), unless the authorization is terminated  or revoked sooner.       Influenza A by PCR NEGATIVE NEGATIVE Final   Influenza B by PCR NEGATIVE NEGATIVE Final    Comment: (NOTE) The Xpert Xpress SARS-CoV-2/FLU/RSV plus assay is intended as an aid in the diagnosis of influenza from Nasopharyngeal swab specimens and should not be used as a sole basis for treatment. Nasal washings and aspirates are unacceptable for Xpert Xpress SARS-CoV-2/FLU/RSV testing.  Fact Sheet for Patients: EntrepreneurPulse.com.au  Fact Sheet for Healthcare Providers: IncredibleEmployment.be  This test is not yet approved or cleared by the [place] FDA and has been authorized for detection and/or diagnosis of SARS-CoV-2 by FDA under an Emergency Use Authorization (EUA). This EUA will remain in effect (meaning this test can be used) for the duration of the COVID-19 declaration under Section 564(b)(1) of the Act, 21 U.S.C. section 360bbb-3(b)(1), unless the authorization is terminated or revoked.  Performed at [facility], [address] [place], [place] [address]      Labs: BNP (last 3 results) Recent Labs    01/19/21 1459  BNP 144.8*   Basic Metabolic Panel: Recent Labs  Lab 01/16/21 0805  01/19/21 1459 01/19/21 1825 01/20/21 0409  NA 135 131*  --  136  K 3.4* 3.6  --  3.7  CL 96* 96*  --  101  CO2 28 28  --  27  GLUCOSE 157* 178*  --  159*  BUN 29* 28*  --  16  CREATININE 0.90 0.81  --  0.74  CALCIUM 9.3 9.0  --  8.5*  MG 1.6*  --  2.0 1.9  PHOS  --   --   --  3.2   Liver Function Tests: Recent Labs  Lab 01/19/21 1459  AST 18  ALT 25  ALKPHOS 46  BILITOT 1.1  PROT 6.4*  ALBUMIN 4.1   No results for input(s): LIPASE, AMYLASE in the last 168 hours. No results for input(s): AMMONIA in the last 168 hours. CBC: Recent Labs  Lab 01/16/21 0805 01/19/21 1459 01/20/21 0409 01/21/21 0441  WBC 2.4* 3.3* 2.2* 2.4*  NEUTROABS 1.8 2.4  --   --   HGB 10.6* 11.2* 9.7* 9.7*  HCT 32.3* 32.2*  27.7* 28.8*  MCV 92.3 92.3 93.0 95.0  PLT 102* 126* 115* 125*   Cardiac Enzymes: No results for input(s): CKTOTAL, CKMB, CKMBINDEX, TROPONINI in the last 168 hours. BNP: Invalid input(s): POCBNP CBG: Recent Labs  Lab 01/22/21 0800 01/22/21 0946 01/22/21 1107 01/22/21 1308 01/22/21 1516  GLUCAP 177* 260* 214* 220* 205*   D-Dimer No results for input(s): DDIMER in the last 72 hours. Hgb A1c Recent Labs    01/20/21 0409  HGBA1C 8.1*   Lipid Profile No results for input(s): CHOL, HDL, LDLCALC, TRIG, CHOLHDL, LDLDIRECT in the last 72 hours. Thyroid function studies No results for input(s): TSH, T4TOTAL, T3FREE, THYROIDAB in the last 72 hours.  Invalid input(s): FREET3 Anemia work up No results for input(s): VITAMINB12, FOLATE, FERRITIN, TIBC, IRON, RETICCTPCT in the last 72 hours. Urinalysis    Component Value Date/Time   COLORURINE YELLOW 11/27/2020 1036   APPEARANCEUR CLEAR 11/27/2020 1036   APPEARANCEUR Clear 09/24/2013 0955   LABSPEC 1.020 11/27/2020 1036   LABSPEC 1.026 09/24/2013 0955   PHURINE 6.0 11/27/2020 1036   GLUCOSEU 500 (A) 11/27/2020 1036   GLUCOSEU >=500 09/24/2013 0955   HGBUR NEGATIVE 11/27/2020 1036   BILIRUBINUR NEGATIVE 11/27/2020  1036   BILIRUBINUR Negative 09/24/2013 0955   KETONESUR NEGATIVE 11/27/2020 1036   PROTEINUR NEGATIVE 11/27/2020 1036   NITRITE NEGATIVE 11/27/2020 1036   LEUKOCYTESUR NEGATIVE 11/27/2020 1036   LEUKOCYTESUR Negative 09/24/2013 0955   Sepsis Labs Invalid input(s): PROCALCITONIN,  WBC,  LACTICIDVEN Microbiology Recent Results (from the past 240 hour(s))  Resp Panel by RT-PCR (Flu A&B, Covid) Nasopharyngeal Swab     Status: None   Collection Time: 01/19/21  2:59 PM   Specimen: Nasopharyngeal Swab; Nasopharyngeal(NP) swabs in vial transport medium  Result Value Ref Range Status   SARS Coronavirus 2 by RT PCR NEGATIVE NEGATIVE Final    Comment: (NOTE) SARS-CoV-2 target nucleic acids are NOT DETECTED.  The SARS-CoV-2 RNA is generally detectable in upper respiratory specimens during the acute phase of infection. The lowest concentration of SARS-CoV-2 viral copies this assay can detect is 138 copies/mL. A negative result does not preclude SARS-Cov-2 infection and should not be used as the sole basis for treatment or other patient management decisions. A negative result may occur with  improper specimen collection/handling, submission of specimen other than nasopharyngeal swab, presence of viral mutation(s) within the areas targeted by this assay, and inadequate number of viral copies(<138 copies/mL). A negative result must be combined with clinical observations, patient history, and epidemiological information. The expected result is Negative.  Fact Sheet for Patients:  EntrepreneurPulse.com.au  Fact Sheet for Healthcare Providers:  IncredibleEmployment.be  This test is no t yet approved or cleared by the [place] FDA and  has been authorized for detection and/or diagnosis of SARS-CoV-2 by FDA under an Emergency Use Authorization (EUA). This EUA will remain  in effect (meaning this test can be used) for the duration of the COVID-19  declaration under Section 564(b)(1) of the Act, 21 U.S.C.section 360bbb-3(b)(1), unless the authorization is terminated  or revoked sooner.       Influenza A by PCR NEGATIVE NEGATIVE Final   Influenza B by PCR NEGATIVE NEGATIVE Final    Comment: (NOTE) The Xpert Xpress SARS-CoV-2/FLU/RSV plus assay is intended as an aid in the diagnosis of influenza from Nasopharyngeal swab specimens and should not be used as a sole basis for treatment. Nasal washings and aspirates are unacceptable for Xpert Xpress SARS-CoV-2/FLU/RSV testing.  Fact Sheet for Patients: EntrepreneurPulse.com.au  Fact Sheet for Healthcare Providers: IncredibleEmployment.be  This test is not yet approved or cleared by the [place] FDA and has been authorized for detection and/or diagnosis of SARS-CoV-2 by FDA under an Emergency Use Authorization (EUA). This EUA will remain in effect (meaning this test can be used) for the duration of the COVID-19 declaration under Section 564(b)(1) of the Act, 21 U.S.C. section 360bbb-3(b)(1), unless the authorization is terminated or revoked.  Performed at [facility], [address] [place], [place] [address]      Time coordinating discharge: Over 30 minutes  SIGNED:   Nolberto Hanlon, MD  [facility] 01/22/2021, 4:44 PM Pager   If 7PM-7AM, please contact night-coverage [email or web address] Password [email or web address]

## 2021-01-22 NOTE — Telephone Encounter (Signed)
called pt wife she stated that on March 4th they had an appt that they kept having call/ video dropped. she wanted to let you know that for some reason video would not stay connected. She stated that on Friday mr. Mohl had to go to get a CT scan done and they found he had blood clots in his lungs. She stated that from there he went to er and was admitted. He suppose to be discharged today.  She just wanted to let you know what was going on.

## 2021-01-22 NOTE — Telephone Encounter (Signed)
Thank you :)

## 2021-01-23 LAB — CARDIOLIPIN ANTIBODIES, IGG, IGM, IGA
Anticardiolipin IgA: <9 APL U/mL (ref 0–11)
Anticardiolipin IgG: <9 GPL U/mL (ref 0–14)
Anticardiolipin IgM: <9 MPL U/mL (ref 0–12)

## 2021-01-24 ENCOUNTER — Other Ambulatory Visit: Payer: Self-pay | Admitting: Psychiatry

## 2021-01-24 ENCOUNTER — Telehealth: Payer: Self-pay

## 2021-01-24 DIAGNOSIS — F4312 Post-traumatic stress disorder, chronic: Secondary | ICD-10-CM

## 2021-01-24 DIAGNOSIS — F411 Generalized anxiety disorder: Secondary | ICD-10-CM

## 2021-01-24 LAB — BETA-2-GLYCOPROTEIN I ABS, IGG/M/A
Beta-2 Glyco I IgG: <9 GPI IgG units (ref 0–20)
Beta-2-Glycoprotein I IgA: <9 GPI IgA units (ref 0–25)
Beta-2-Glycoprotein I IgM: <9 GPI IgM units (ref 0–32)

## 2021-01-24 NOTE — Progress Notes (Signed)
[facility]  [address] [place], [place] [address] Phone: [phone]  Fax: [phone]   Clinic Day:  01/25/2021  Referring physician: Derinda Late, MD  Chief Complaint: Sean Garner. is a 75 y.o. 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  HPI:  The patient was last seen in the medical oncology clinic on 01/16/2021. At that time, he was doing well. He was recovering from radiation. He denied any nausea or vomiting. He was gaining weight. Exam revealed a 1 cm right neck node. Hematocrit was 32.3, hemoglobin 10.6, MCV 92.3, platelets 102,000, WBC 2,400 (ANC 1,800). Potassium was 3.4. Magnesium was 1.6. We discussed plans for assessment of his esophageal cancer.  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 The presence of right heart strain has been associated with an increased risk of morbidity and mortality. There was no evidence of metastatic disease within the chest, abdomen or pelvis. Improvement in previously demonstrated distal esophageal wall thickening without apparent focal mass lesion. There was aortic atherosclerosis and emphysema.  The patient was admitted to [facility] from 01/19/2021 - 01/22/2021 for bilateral pulmonary emboli. Bilateral lower extremity venous duplex was positive for occlusive DVT in the right deep femoral vein. Vascular surgery was consulted and did not feel that the patient needed thrombectomy. He was placed on an IV heparin drip then was switched to p.o. Eliquis on discharge.  Hypercoagulable work-up revealed normal anti-cardiolipin antibodies and beta-2 glycoprotein antibodies.  Prothrombin gene mutation and  factor 5 leiden are pending.  CBC on 01/21/2021 revealed a hematocrit of 28.8, hemoglobin 9.7, MCV 95, platelets are 25,000, WBC 2400.  Creatinine was 0.74 on 01/20/2021.  During the interim, he has been "pretty good." He is taking Eliquis and denies any bruising or bleeding. His sore throat continues to improve. He has not been short of breath lately.  Home health is doing physical therapy with him at home. He reports that he has left drop foot, which started before he went to the hospital. He denies numbness, weakness, balance, and coordination problems.  He is taking vitamin B12.   Past Medical History:  Diagnosis Date  . Anxiety   . Cancer [facility])    Head/throat Cancer at the base of the tongue  . Cataract    lt eye repair; present in rt eye but not ripe yet.  . Claustrophobia   . Diabetes mellitus without complication ([place])   . GERD (gastroesophageal reflux disease)   . Glaucoma    using gtts for this.  . Hypercholesterolemia   . Hypertension     Past Surgical History:  Procedure Laterality Date  . CATARACT EXTRACTION W/PHACO Left 12/03/2017   Procedure: CATARACT EXTRACTION PHACO AND INTRAOCULAR LENS PLACEMENT ([place]) COMPLICATED DIABETIC LEFT;  Surgeon: Leandrew Koyanagi, MD;  Location: [facility];  Service: Ophthalmology;  Laterality: Left;  Diabetic - oral meds  . CHOLECYSTECTOMY    . COLONOSCOPY  2016  . PORTA CATH INSERTION N/A 11/13/2020   Procedure: PORTA CATH INSERTION;  Surgeon: Algernon Huxley, MD;  Location: [facility];  Service: Cardiovascular;  Laterality: N/A;    Family History  Problem Relation Age of Onset  . Cancer Mother   . Colon cancer Neg Hx   .  Colon polyps Neg Hx   . Esophageal cancer Neg Hx   . Rectal cancer Neg Hx   . Stomach cancer Neg Hx     Social History:  reports that he quit smoking about 36 years ago. He has never used smokeless tobacco. He reports previous alcohol use. He reports that he does not use drugs. He quit  smoking cold [place] in 1986. He smoked 2 packs per day for 20 years. He drinks a glass of wine or a Margarita occasionally. 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 He was exposed to [employer]. He worked with a Geologist, engineering for 34 years. The patient is accompanied by his wife today.  Allergies: No Known Allergies  Current Medications: Current Outpatient Medications  Medication Sig Dispense Refill  . ALPRAZolam (XANAX) 0.25 MG tablet Take 1 tablet (0.25 mg total) by mouth at bedtime as needed for anxiety. 60 tablet 0  . APIXABAN (ELIQUIS) VTE STARTER PACK (10MG  AND 5MG ) Take as directed on package: start with two-5mg  tablets twice daily for 7 days. On day 8 (01/29/2021), switch to one-5mg  tablet twice daily. 1 tablet 0  . brimonidine-timolol (COMBIGAN) 0.2-0.5 % ophthalmic solution Place 1 drop into the left eye daily.    . calcium carbonate (OSCAL) 1500 (600 Ca) MG TABS tablet Take by mouth 2 (two) times daily with a meal.    . citalopram (CELEXA) 20 MG tablet TAKE 1.5 TABLETS (30 MG TOTAL) BY MOUTH DAILY WITH BREAKFAST. 45 tablet 0  . dexamethasone (DECADRON) 4 MG tablet 2 TABS BY MOUTH DAILY. TAKE DAILY X 3 DAYS STARTING THE DAY AFTER CHEMOTHERAPY. TAKE WITH FOOD. 30 tablet 1  . esomeprazole (NEXIUM) 20 MG capsule Take 40 mg by mouth daily at 12 noon.    . fluticasone (FLONASE) 50 MCG/ACT nasal spray Place into both nostrils.    Marland Kitchen glipiZIDE (GLUCOTROL XL) 5 MG 24 hr tablet Take 5 mg by mouth 2 (two) times daily.  1  . glucose blood test strip     . lidocaine-prilocaine (EMLA) cream Apply to affected area once 30 g 3  . magic mouthwash w/lidocaine SOLN Take 5 mLs by mouth.    . metFORMIN (GLUCOPHAGE-XR) 500 MG 24 hr tablet Take 500 mg by mouth 2 (two) times daily. Takes 2 tablets and 1 tablet in the pm    . simvastatin (ZOCOR) 40 MG tablet Take 40 mg by mouth daily at 6 PM.    . traZODone (DESYREL) 50 MG tablet Take 0.5-1 tablets (25-50 mg total) by mouth at bedtime as needed  for sleep. 30 tablet 2  . VITAMIN D PO Take 800 Units by mouth daily.    . ondansetron (ZOFRAN) 8 MG tablet Take 1 tablet (8 mg total) by mouth 2 (two) times daily as needed. Start on the third day after cisplatin chemotherapy. (Patient not taking: No sig reported) 30 tablet 1  . sucralfate (CARAFATE) 1 g tablet Take 0.5 tablets (0.5 g total) by mouth 3 (three) times daily. Dissolve in warm water, swish and swallow (Patient not taking: No sig reported) 60 tablet 1   Current Facility-Administered Medications  Medication Dose Route Frequency Provider Last Rate Last Admin  . 0.9 %  sodium chloride infusion  500 mL Intravenous Once Pyrtle, Lajuan Lines, MD       Facility-Administered Medications Ordered in Other Visits  Medication Dose Route Frequency Provider Last Rate Last Admin  . 0.9 % NaCl with KCl 20 mEq/ L  infusion  Intravenous Once Lequita Asal, MD      . CISplatin (PLATINOL) 87 mg in sodium chloride 0.9 % 250 mL chemo infusion  40 mg/m2 (Order-Specific) Intravenous Once Lequita Asal, MD      . dexamethasone (DECADRON) 10 mg in sodium chloride 0.9 % 50 mL IVPB  10 mg Intravenous Once Trumaine Wimer C, MD      . fosaprepitant (EMEND) 150 mg in sodium chloride 0.9 % 145 mL IVPB  150 mg Intravenous Once Tristin Gladman C, MD      . magnesium sulfate IVPB 2 g 50 mL  2 g Intravenous Once Lequita Asal, MD        Review of Systems  Constitutional: Positive for weight loss (5 lbs). Negative for chills, diaphoresis, fever and malaise/fatigue.       Feels "pretty good".  HENT: Positive for hearing loss. Negative for congestion, ear discharge, ear pain, nosebleeds, sinus pain, sore throat (continues to improve) and tinnitus.   Eyes: Negative for blurred vision.  Respiratory: Negative for cough, hemoptysis, sputum production and shortness of breath.   Cardiovascular: Negative for chest pain, palpitations and leg swelling.  Gastrointestinal: Negative for abdominal pain, blood in  stool, constipation, diarrhea, heartburn, melena, nausea and vomiting.  Genitourinary: Negative for dysuria, frequency, hematuria and urgency.  Musculoskeletal: Negative for back pain, joint pain, myalgias and neck pain.  Skin: Negative for itching and rash.  Neurological: Negative for dizziness, tingling, sensory change, weakness and headaches.       Left drop foot.  Endo/Heme/Allergies: Does not bruise/bleed easily.  Psychiatric/Behavioral: Negative for depression and memory loss. The patient is not nervous/anxious (on citalopram and Xanax) and does not have insomnia.   All other systems reviewed and are negative.  Performance status (ECOG): 1  Vitals Blood pressure 120/66, temperature (!) 96.9 F (36.1 C), temperature source Tympanic, weight 185 lb 15.3 oz (84.4 kg), SpO2 97 %.   Physical Exam Vitals and nursing note reviewed.  Constitutional:      General: He is not in acute distress.    Appearance: He is not diaphoretic.  HENT:     Head: Normocephalic and atraumatic.     Comments: Gray hair.    Mouth/Throat:     Mouth: Mucous membranes are moist.     Pharynx: Oropharynx is clear.  Eyes:     General: No scleral icterus.    Extraocular Movements: Extraocular movements intact.     Conjunctiva/sclera: Conjunctivae normal.     Pupils: Pupils are equal, round, and reactive to light.     Comments: Blue eyes.  Cardiovascular:     Rate and Rhythm: Normal rate and regular rhythm.     Heart sounds: Normal heart sounds. No murmur heard.   Pulmonary:     Effort: Pulmonary effort is normal. No respiratory distress.     Breath sounds: Normal breath sounds. No wheezing or rales.  Chest:     Chest wall: No tenderness.  Breasts:     Right: No axillary adenopathy or supraclavicular adenopathy.     Left: No axillary adenopathy or supraclavicular adenopathy.    Abdominal:     General: Bowel sounds are normal. There is no distension.     Palpations: Abdomen is soft. There is no mass.      Tenderness: There is no abdominal tenderness. There is no guarding or rebound.  Musculoskeletal:        General: No swelling or tenderness. Normal range of motion.     Cervical  back: Normal range of motion and neck supple.  Lymphadenopathy:     Head:     Right side of head: No preauricular, posterior auricular or occipital adenopathy.     Left side of head: No preauricular, posterior auricular or occipital adenopathy.     Upper Body:     Right upper body: No supraclavicular or axillary adenopathy.     Left upper body: No supraclavicular or axillary adenopathy.     Lower Body: No right inguinal adenopathy. No left inguinal adenopathy.  Skin:    General: Skin is warm and dry.  Neurological:     Mental Status: He is alert and oriented to person, place, and time.     Comments: Left drop foot.  Cranial nerves II-XII intact. Sensation intact. Upper and lower extremity strength are normal. RAM are normal. Finger to nose test is normal with both hands. Normal patellar reflexes.  Psychiatric:        Behavior: Behavior normal.        Thought Content: Thought content normal.        Judgment: Judgment normal.    No visits with results within 3 Day(s) from this visit.  Latest known visit with results is:  Admission on 01/19/2021, Discharged on 01/22/2021  Component Date Value Ref Range Status  . WBC 01/19/2021 3.3* 4.0 - 10.5 K/uL Final  . RBC 01/19/2021 3.49* 4.22 - 5.81 MIL/uL Final  . Hemoglobin 01/19/2021 11.2* 13.0 - 17.0 g/dL Final  . HCT 01/19/2021 32.2* 39.0 - 52.0 % Final  . MCV 01/19/2021 92.3  80.0 - 100.0 fL Final  . MCH 01/19/2021 32.1  26.0 - 34.0 pg Final  . MCHC 01/19/2021 34.8  30.0 - 36.0 g/dL Final  . RDW 01/19/2021 20.0* 11.5 - 15.5 % Final  . Platelets 01/19/2021 126* 150 - 400 K/uL Final   Comment: Immature Platelet Fraction may be clinically indicated, consider ordering this additional test [id number]   . nRBC 01/19/2021 0.0  0.0 - 0.2 % Final  . Neutrophils  Relative % 01/19/2021 75  % Final  . Neutro Abs 01/19/2021 2.4  1.7 - 7.7 K/uL Final  . Lymphocytes Relative 01/19/2021 11  % Final  . Lymphs Abs 01/19/2021 0.4* 0.7 - 4.0 K/uL Final  . Monocytes Relative 01/19/2021 9  % Final  . Monocytes Absolute 01/19/2021 0.3  0.1 - 1.0 K/uL Final  . Eosinophils Relative 01/19/2021 0  % Final  . Eosinophils Absolute 01/19/2021 0.0  0.0 - 0.5 K/uL Final  . Basophils Relative 01/19/2021 0  % Final  . Basophils Absolute 01/19/2021 0.0  0.0 - 0.1 K/uL Final  . Immature Granulocytes 01/19/2021 5  % Final  . Abs Immature Granulocytes 01/19/2021 0.15* 0.00 - 0.07 K/uL Final   Performed at [facility], [address] [place], [place] [address]  . Sodium 01/19/2021 131* 135 - 145 mmol/L Final  . Potassium 01/19/2021 3.6  3.5 - 5.1 mmol/L Final  . Chloride 01/19/2021 96* 98 - 111 mmol/L Final  . CO2 01/19/2021 28  22 - 32 mmol/L Final  . Glucose, Bld 01/19/2021 178* 70 - 99 mg/dL Final   Glucose reference range applies only to samples taken after fasting for at least 8 hours.  . BUN 01/19/2021 28* 8 - 23 mg/dL Final  . Creatinine, Ser 01/19/2021 0.81  0.61 - 1.24 mg/dL Final  . Calcium 01/19/2021 9.0  8.9 - 10.3 mg/dL Final  . Total Protein 01/19/2021 6.4* 6.5 - 8.1 g/dL Final  .  Albumin 01/19/2021 4.1  3.5 - 5.0 g/dL Final  . AST 01/19/2021 18  15 - 41 U/L Final  . ALT 01/19/2021 25  0 - 44 U/L Final  . Alkaline Phosphatase 01/19/2021 46  38 - 126 U/L Final  . Total Bilirubin 01/19/2021 1.1  0.3 - 1.2 mg/dL Final  . GFR, Estimated 01/19/2021 >60  >60 mL/min Final   Comment: (NOTE) Calculated using the CKD-EPI Creatinine Equation (2021)   . Anion gap 01/19/2021 7  5 - 15 Final   Performed at [facility], [address] [place], [place] [address]  . Troponin I (High Sensitivity) 01/19/2021 23* <18 ng/L Final   Comment: (NOTE) Elevated high sensitivity troponin I (hsTnI) values and significant  changes across serial measurements  may suggest ACS but many other  chronic and acute conditions are known to elevate hsTnI results.  Refer to the "Links" section for chest pain algorithms and additional  guidance. Performed at [facility], [address], [place], [place] [address]   . Lactic Acid, Venous 01/19/2021 1.2  0.5 - 1.9 mmol/L Final   Performed at [facility], [address] [place], [place] [address]  . Prothrombin Time 01/19/2021 14.1  11.4 - 15.2 seconds Final  . INR 01/19/2021 1.1  0.8 - 1.2 Final   Comment: (NOTE) INR goal varies based on device and disease states. Performed at [facility], [address], [place], [place] [address]   . aPTT 01/19/2021 26  24 - 36 seconds Final   Performed at [facility], [address] [place], [place] [address]  . B Natriuretic Peptide 01/19/2021 221.1* 0.0 - 100.0 pg/mL Final   Performed at [facility], [address] [place], [place] [address]  . SARS Coronavirus 2 by RT PCR 01/19/2021 NEGATIVE  NEGATIVE Final   Comment: (NOTE) SARS-CoV-2 target nucleic acids are NOT DETECTED.  The SARS-CoV-2 RNA is generally detectable in upper respiratory specimens during the acute phase of infection. The lowest concentration of SARS-CoV-2 viral copies this assay can detect is 138 copies/mL. A negative result does not preclude SARS-Cov-2 infection and should not be used as the sole basis for treatment or other patient management decisions. A negative result may occur with  improper specimen collection/handling, submission of specimen other than nasopharyngeal swab, presence of viral mutation(s) within the areas targeted by this assay, and inadequate number of viral copies(<138 copies/mL). A negative result must be combined with clinical observations, patient history, and epidemiological information. The expected result is Negative.  Fact Sheet for Patients:  EntrepreneurPulse.com.au  Fact Sheet for  Healthcare Providers:  IncredibleEmployment.be  This test is no                          t yet approved or cleared by the [place] FDA and  has been authorized for detection and/or diagnosis of SARS-CoV-2 by FDA under an Emergency Use Authorization (EUA). This EUA will remain  in effect (meaning this test can be used) for the duration of the COVID-19 declaration under Section 564(b)(1) of the Act, 21 U.S.C.section 360bbb-3(b)(1), unless the authorization is terminated  or revoked sooner.      . Influenza A by PCR 01/19/2021 NEGATIVE  NEGATIVE Final  . Influenza B by PCR 01/19/2021 NEGATIVE  NEGATIVE Final   Comment: (NOTE) The Xpert Xpress SARS-CoV-2/FLU/RSV plus assay is intended as an aid in the diagnosis of influenza from Nasopharyngeal swab specimens and should  not be used as a sole basis for treatment. Nasal washings and aspirates are unacceptable for Xpert Xpress SARS-CoV-2/FLU/RSV testing.  Fact Sheet for Patients: EntrepreneurPulse.com.au  Fact Sheet for Healthcare Providers: IncredibleEmployment.be  This test is not yet approved or cleared by the [place] FDA and has been authorized for detection and/or diagnosis of SARS-CoV-2 by FDA under an Emergency Use Authorization (EUA). This EUA will remain in effect (meaning this test can be used) for the duration of the COVID-19 declaration under Section 564(b)(1) of the Act, 21 U.S.C. section 360bbb-3(b)(1), unless the authorization is terminated or revoked.  Performed at [facility], [address], [place], [place] [address]   . Troponin I (High Sensitivity) 01/19/2021 21* <18 ng/L Final   Comment: (NOTE) Elevated high sensitivity troponin I (hsTnI) values and significant  changes across serial measurements may suggest ACS but many other  chronic and acute conditions are known to elevate hsTnI results.  Refer to the "Links" section for chest  pain algorithms and additional  guidance. Performed at [facility], [address], [place], [place] [address]   . Heparin Unfractionated 01/19/2021 1.15* 0.30 - 0.70 IU/mL Final   Comment: (NOTE) If heparin results are below expected values, and patient dosage has  been confirmed, suggest follow up testing of antithrombin III levels. Performed at [facility], [address], [place], [place] [address]   . Magnesium 01/19/2021 2.0  1.7 - 2.4 mg/dL Final   Performed at [facility], [address] [place], [place] [address]  . Hgb A1c MFr Bld 01/20/2021 8.1* 4.8 - 5.6 % Final   Comment: (NOTE) Pre diabetes:          5.7%-6.4%  Diabetes:              >6.4%  Glycemic control for   <7.0% adults with diabetes   . Mean Plasma Glucose 01/20/2021 185.77  mg/dL Final   Performed at [facility] [address], [place], [place] [address]  . Sodium, Ur 01/19/2021 99  mmol/L Final   Performed at [facility], [address] [place], [place] [address]  . Osmolality 01/19/2021 291  275 - 295 mOsm/kg Final   Performed at [facility], [address] [place], [place] [address]  . Osmolality, Ur 01/19/2021 572  300 - 900 mOsm/kg Final   Performed at [facility], [address] [place], [place] [address]  . Phosphorus 01/20/2021 3.2  2.5 - 4.6 mg/dL Final   Performed at [facility], [address] [place], [place] [address]  . Magnesium 01/20/2021 1.9  1.7 - 2.4 mg/dL Final   Performed at [facility], [address] [place], [place] [address]  . Sodium 01/20/2021 136  135 - 145 mmol/L Final  . Potassium 01/20/2021 3.7  3.5 - 5.1 mmol/L Final  . Chloride 01/20/2021 101  98 - 111 mmol/L Final  . CO2 01/20/2021 27  22 - 32 mmol/L Final  . Glucose, Bld 01/20/2021 159* 70 - 99 mg/dL Final   Glucose reference range applies only to samples taken after fasting for at least 8 hours.  . BUN 01/20/2021 16  8  - 23 mg/dL Final  . Creatinine, Ser 01/20/2021 0.74  0.61 - 1.24 mg/dL Final  . Calcium 01/20/2021 8.5* 8.9 - 10.3 mg/dL Final  . GFR, Estimated 01/20/2021 >60  >60 mL/min Final   Comment: (NOTE) Calculated using the CKD-EPI Creatinine Equation (2021)   . Anion  gap 01/20/2021 8  5 - 15 Final   Performed at [facility], [address] [place], [place] [address]  . WBC 01/20/2021 2.2* 4.0 - 10.5 K/uL Final  . RBC 01/20/2021 2.98* 4.22 - 5.81 MIL/uL Final  . Hemoglobin 01/20/2021 9.7* 13.0 - 17.0 g/dL Final  . HCT 01/20/2021 27.7* 39.0 - 52.0 % Final  . MCV 01/20/2021 93.0  80.0 - 100.0 fL Final  . MCH 01/20/2021 32.6  26.0 - 34.0 pg Final  . MCHC 01/20/2021 35.0  30.0 - 36.0 g/dL Final  . RDW 01/20/2021 20.4* 11.5 - 15.5 % Final  . Platelets 01/20/2021 115* 150 - 400 K/uL Final   Comment: Immature Platelet Fraction may be clinically indicated, consider ordering this additional test [id number]   . nRBC 01/20/2021 0.9* 0.0 - 0.2 % Final   Performed at [facility], [address] [place], [place] [address]  . Glucose-Capillary 01/19/2021 209* 70 - 99 mg/dL Final   Glucose reference range applies only to samples taken after fasting for at least 8 hours.  . Glucose-Capillary 01/19/2021 196* 70 - 99 mg/dL Final   Glucose reference range applies only to samples taken after fasting for at least 8 hours.  . Heparin Unfractionated 01/20/2021 0.86* 0.30 - 0.70 IU/mL Final   Comment: (NOTE) If heparin results are below expected values, and patient dosage has  been confirmed, suggest follow up testing of antithrombin III levels. Performed at [facility], [address] [place], [place] [address]   . Heparin Unfractionated 01/20/2021 0.57  0.30 - 0.70 IU/mL Final   Comment: (NOTE) If heparin results are below expected values, and patient dosage has  been confirmed, suggest follow up testing of antithrombin III levels. Performed at [facility], [address] [place], [place] [address]   . WBC 01/21/2021 2.4* 4.0 - 10.5 K/uL Final  . RBC 01/21/2021 3.03* 4.22 - 5.81 MIL/uL Final  . Hemoglobin 01/21/2021 9.7* 13.0 - 17.0 g/dL Final  . HCT 01/21/2021 28.8* 39.0 - 52.0 % Final  . MCV 01/21/2021 95.0  80.0 - 100.0 fL Final  . MCH 01/21/2021 32.0  26.0 - 34.0 pg Final  . MCHC 01/21/2021 33.7  30.0 - 36.0 g/dL Final  . RDW 01/21/2021 20.4* 11.5 - 15.5 % Final  . Platelets 01/21/2021 125* 150 - 400 K/uL Final   Comment: Immature Platelet Fraction may be clinically indicated, consider ordering this additional test [id number]   . nRBC 01/21/2021 0.0  0.0 - 0.2 % Final   Performed at [facility], [address] [place], [place] [address]  . Anticardiolipin IgG 01/21/2021 <9  0 - 14 GPL U/mL Final   Comment: (NOTE)                          Negative:              <15                          Indeterminate:     15 - 20                          Low-Med Positive: >20 - 80                          High Positive:         >  80   . Anticardiolipin IgM 01/21/2021 <9  0 - 12 MPL U/mL Final   Comment: (NOTE)                          Negative:              <13                          Indeterminate:     13 - 20                          Low-Med Positive: >20 - 80                          High Positive:         >80   . Anticardiolipin IgA 01/21/2021 <9  0 - 11 APL U/mL Final   Comment: (NOTE)                          Negative:              <12                          Indeterminate:     12 - 20                          Low-Med Positive: >20 - 80                          High Positive:         >80 Performed At: [facility] [address] [place], [place] [id number] Rush Farmer MD [id number]   . Beta-2 Glyco I IgG 01/21/2021 <9  0 - 20 GPI IgG units Final   Comment: (NOTE) The reference interval reflects a 3SD or 99th percentile interval, which is thought to represent a potentially clinically significant result in  accordance with the International Consensus Statement on the classification criteria for definitive antiphospholipid syndrome (APS). J Thromb Haem 2006;4:295-306.   . Beta-2-Glycoprotein I IgM 01/21/2021 <9  0 - 32 GPI IgM units Final   Comment: (NOTE) The reference interval reflects a 3SD or 99th percentile interval, which is thought to represent a potentially clinically significant result in accordance with the International Consensus Statement on the classification criteria for definitive antiphospholipid syndrome (APS). J Thromb Haem 2006;4:295-306. Performed At: [facility] [place], [place] [id number] Rush Farmer MD OE:[id number]   . Beta-2-Glycoprotein I IgA 01/21/2021 <9  0 - 25 GPI IgA units Final   Comment: (NOTE) The reference interval reflects a 3SD or 99th percentile interval, which is thought to represent a potentially clinically significant result in accordance with the International Consensus Statement on the classification criteria for definitive antiphospholipid syndrome (APS). J Thromb Haem 2006;4:295-306.   Marland Kitchen Heparin Unfractionated 01/21/2021 0.58  0.30 - 0.70 IU/mL Final   Comment: (NOTE) If heparin results are below expected values, and patient dosage has  been confirmed, suggest follow up testing of antithrombin III levels. Performed at [facility], [address] [place], [place] [address]   . Heparin Unfractionated 01/22/2021 0.44  0.30 - 0.70 IU/mL Final   Comment: (NOTE) If heparin results are below  expected values, and patient dosage has  been confirmed, suggest follow up testing of antithrombin III levels. Performed at [facility], [address], [place], [place] [address]   . Glucose-Capillary 01/22/2021 214* 70 - 99 mg/dL Final   Glucose reference range applies only to samples taken after fasting for at least 8 hours.  . Glucose-Capillary 01/22/2021 177* 70 - 99 mg/dL Final   Glucose reference  range applies only to samples taken after fasting for at least 8 hours.  . Glucose-Capillary 01/22/2021 260* 70 - 99 mg/dL Final   Glucose reference range applies only to samples taken after fasting for at least 8 hours.  . Glucose-Capillary 01/21/2021 174* 70 - 99 mg/dL Final   Glucose reference range applies only to samples taken after fasting for at least 8 hours.  . Glucose-Capillary 01/21/2021 170* 70 - 99 mg/dL Final   Glucose reference range applies only to samples taken after fasting for at least 8 hours.  . Glucose-Capillary 01/21/2021 207* 70 - 99 mg/dL Final   Glucose reference range applies only to samples taken after fasting for at least 8 hours.  . Glucose-Capillary 01/21/2021 172* 70 - 99 mg/dL Final   Glucose reference range applies only to samples taken after fasting for at least 8 hours.  . Glucose-Capillary 01/20/2021 138* 70 - 99 mg/dL Final   Glucose reference range applies only to samples taken after fasting for at least 8 hours.  . Glucose-Capillary 01/20/2021 223* 70 - 99 mg/dL Final   Glucose reference range applies only to samples taken after fasting for at least 8 hours.  . Glucose-Capillary 01/20/2021 160* 70 - 99 mg/dL Final   Glucose reference range applies only to samples taken after fasting for at least 8 hours.  . Glucose-Capillary 01/20/2021 282* 70 - 99 mg/dL Final   Glucose reference range applies only to samples taken after fasting for at least 8 hours.  . Glucose-Capillary 01/22/2021 205* 70 - 99 mg/dL Final   Glucose reference range applies only to samples taken after fasting for at least 8 hours.  . Glucose-Capillary 01/22/2021 220* 70 - 99 mg/dL Final   Glucose reference range applies only to samples taken after fasting for at least 8 hours.    Assessment:  Sean Garner. is a 75 y.o. 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  He presented with right neck adenopathy.    Neck soft tissue CT on 09/28/2020 revealed a 3.5 x 3.0 x 4.8 cm right neck mass most c/w enlarged lymph node. There was a 14 mm enhancing mass in the right base of tongue.   PET scan on 10/25/2020 revealed right tongue base primary with ipsilateral level 2 nodal metastasis. There were no findings of extracervical metastatic disease. There was focal hypermetabolism in the distal esophagus, with possible concurrent soft tissue density lesion. Recommended correlation with endoscopy. There was vague right lower lobe low-level hypermetabolism and ground-glass nodularity, favored minimal infection or aspiration. Incidental findings included aortic atherosclerosis, coronary artery atherosclerosis, emphysema, a tiny hiatal hernia, and prostatomegaly.  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  Speech discrimination scores were 100% in each ear.  He received 6 weeks of cisplatin with concurrent radiation (11/20/2020 - 12/27/2020).  He completed radiation on 01/11/2021.  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 There was no evidence of metastatic disease within the chest, abdomen or pelvis.  Improvement in previously demonstrated distal esophageal wall thickening without apparent focal mass lesion. There was aortic atherosclerosis and emphysema.  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  Upper endoscopy on 11/03/2020 revealed a malignant esophageal tumor at the gastroesophageal junction. There were esophageal mucosal changes c/w short-segment Barrett's esophagus. There was a 2 cm hiatal hernia. There was gastritis.  Pathology in the esophagus at 36 cm  revealed intramucosal adenocarcinoma arising in a background of high grade dysplasia and intestinal metaplasia; pathology at 38 cm revealed adenocarcinoma.  EUS on 11/15/2020 revealed early stage adenocarcinoma arising from Barrett's esophagus.  Lesion was T1bN0 and non-obstructing.  He was diagnosed with bilateral pulmonary emboli on 01/19/2021.  Bilateral lower extremity duplex on 01/20/2021 revealed occlusive DVT in the right deep femoral vein.  revealed normal anti-cardiolipin antibodies and beta-2 glycoprotein antibodies.  Prothrombin gene mutation and factor 5 leiden are pending.   He has B12 deficiency.  B12 was 180 on 01/08/2021.  He is on oral B12.  He has PTSD and clastrophobia.  Anxiety was relieved by alprazolam prior to radiation.  He is on citalopram 30 mg a day and trazodone at night.  He was admitted to [facility] from 01/19/2021 - 01/22/2021 with bilateral pulmonary emboli. Bilateral lower extremity venous duplex was positive for occlusive DVT in the right deep femoral vein. He was placed on heparin drip and discharged on Eliquis.  Symptomatically, he is doing well. He denies any bleeding on Eliquis.  He has a new left foot drop.  Plan: 1.   Clinical stage I right base of tongue carcinoma Clinical stage I. 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 PET scan on 10/25/2020 revealed a right base of tongue primary with ipsilateral level 2 nodal metastasis. No evidence of distant metastasis.             He completed concurrent radiation and cisplatin on 01/11/2021.  Clinically, he is doing well.  Exam reveals a tiny right node.  He denies any symptoms of mucositis.  Radiation changes are resolving.  Anticipate 9-month follow-up PET scan after completion of radiation (04/10/2021).   2. 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 EUS on 11/15/2020 revealed a T1bN0 lesion.  Chest, abdomen and pelvis CT on 01/19/2021 was personally reviewed.   Distal esophageal wall thickening has improved.  He may be a candidate for endoscopic resection followed by ablation or esophagectomy.   Anticipate follow-up CT scan and upper endoscopy with ultrasound for restaging.  Reschedule appointment with Dr. Keturah BarreAbigail Butts at [facility]. 3.   Mucositis  Radiation-induced mucositis has resolved.  He notes very little throat tenderness.  Diet is good.  He is eating well. 4. Bilateral pulmonary emboli and RLE DVT  Diagnosis discovered with reimaging studies for esophageal cancer.  Patient on Eliquis and tolerating well.  Echo on 02/14/2021. 5.   Left foot drop  Patient notes new onset of left foot drop.  Patient denies any back pain or other neurologic symptoms.  No apparent injury to peroneal nerve.  Consider head MRI to r/o CNS event (will discuss with Dr Manuella Ghazi).  Discuss neurology  consultation with possible nerve conduction studies.  Consult neurology (Dr Manuella Ghazi). 6.   B12 deficiency  B12 was 180 on 01/08/2021.  He has been on oral B12.  Check B12 level in 1 month.  B12 goal 400.  Anticipate B12 injections if B12 not at goal in 1 month. 7.   RN:  Needs appt at [place] for [place] through Francee Nodal, nurse navigator at the [facility], [phone]. 8.   Referral neurology. 9.   RTC in 2 weeks for MD assessment and labs (CBC with diff, CMP, Mg, B12).  I discussed the assessment and treatment plan with the patient.  The patient was provided an opportunity to ask questions and all were answered.  The patient agreed with the plan and demonstrated an understanding of the instructions.  The patient was advised to call back if the symptoms worsen or if the condition fails to improve as anticipated.  I provided  18 minutes of face-to-face time during this this encounter and > 50% was spent counseling as documented under my assessment and plan.  An additional 10+ minutes were spent reviewing his chart (Epic and Care Everywhere) including notes, labs, and imaging studies.    Gaylon Bentz C. Mike Gip, MD, PhD    01/25/2021, 11:49 AM   I, Mirian Mo Tufford, am acting as Education administrator for [employer]. Mike Gip, MD, PhD.  I, Nigil Braman C. Mike Gip, MD, have reviewed the above documentation for accuracy and completeness, and I agree with the above.

## 2021-01-24 NOTE — Telephone Encounter (Signed)
Received a voicemail stating that the authorization for [name] for surgical oncology is no longer valid because the patient had to start on blood thinners and he cant be seen by surgery. He will need a new one. Call back number is [phone]

## 2021-01-24 NOTE — Telephone Encounter (Signed)
His last 4 of SSN is [id number] (you will need if you call that number back)

## 2021-01-25 ENCOUNTER — Telehealth: Payer: Self-pay

## 2021-01-25 ENCOUNTER — Inpatient Hospital Stay (HOSPITAL_BASED_OUTPATIENT_CLINIC_OR_DEPARTMENT_OTHER): Payer: Medicare Other | Admitting: Hematology and Oncology

## 2021-01-25 ENCOUNTER — Encounter: Payer: Self-pay | Admitting: Hematology and Oncology

## 2021-01-25 ENCOUNTER — Other Ambulatory Visit: Payer: Self-pay

## 2021-01-25 VITALS — BP 120/66 | Temp 96.9°F | Wt 186.0 lb

## 2021-01-25 DIAGNOSIS — I82411 Acute embolism and thrombosis of right femoral vein: Secondary | ICD-10-CM | POA: Diagnosis not present

## 2021-01-25 DIAGNOSIS — C01 Malignant neoplasm of base of tongue: Secondary | ICD-10-CM

## 2021-01-25 DIAGNOSIS — C159 Malignant neoplasm of esophagus, unspecified: Secondary | ICD-10-CM | POA: Diagnosis not present

## 2021-01-25 DIAGNOSIS — I2699 Other pulmonary embolism without acute cor pulmonale: Secondary | ICD-10-CM

## 2021-01-25 DIAGNOSIS — E538 Deficiency of other specified B group vitamins: Secondary | ICD-10-CM

## 2021-01-25 DIAGNOSIS — M21372 Foot drop, left foot: Secondary | ICD-10-CM

## 2021-01-25 NOTE — Telephone Encounter (Signed)
  Please follow-up on Monday with her if you do not hear from her before then.  M

## 2021-01-26 ENCOUNTER — Telehealth: Payer: Self-pay | Admitting: *Deleted

## 2021-01-26 DIAGNOSIS — M21372 Foot drop, left foot: Secondary | ICD-10-CM | POA: Insufficient documentation

## 2021-01-26 NOTE — Telephone Encounter (Signed)
[facility] called asking for orders for this patient 2wk2, 1wk6 for endurance, balance and strengthening. Please advise if you approve orders

## 2021-01-29 ENCOUNTER — Telehealth: Payer: Self-pay | Admitting: *Deleted

## 2021-01-29 ENCOUNTER — Inpatient Hospital Stay: Payer: No Typology Code available for payment source | Admitting: Hematology and Oncology

## 2021-01-29 LAB — PROTHROMBIN GENE MUTATION

## 2021-01-29 LAB — FACTOR 5 LEIDEN

## 2021-01-29 NOTE — Telephone Encounter (Signed)
Looks like we have authorization for procedure, should we be good from the [place] standpoint?

## 2021-01-29 NOTE — Telephone Encounter (Signed)
Patient called regarding the referral to Neurology and needing to go through [place], I explained to him that we had gotten a call from [place] and have already sent the referral to [place] at the [place]. He then mentioned that he is to have an Echocardiogram and is asking if that also needs to be going through the [place] as well

## 2021-01-29 NOTE — Telephone Encounter (Signed)
Request for referral has been faxed to [place].

## 2021-01-29 NOTE — Telephone Encounter (Signed)
I see that he is also scheduled to see Dr Fletcher Anon

## 2021-01-29 NOTE — Telephone Encounter (Signed)
Sean Garner with the [facility] called stating that proper channels were not followed for a neurology referral for this patient. She states that our office MUST fax the request for referral to Neurology Stating choice of physician and state why being referred as well as faxing the Office Note to stating same to [phone] and their doctors will go over it and make the referral

## 2021-01-30 NOTE — Telephone Encounter (Signed)
Yes, his [place] auth covers anything relevant to his cancer treatment.

## 2021-01-31 ENCOUNTER — Telehealth: Payer: Self-pay | Admitting: *Deleted

## 2021-01-31 NOTE — Telephone Encounter (Signed)
Tiffany with [facility] called asking for orders approval for SN 1wk1, 2wk2, 1 wk 6. Please return her call

## 2021-01-31 NOTE — Telephone Encounter (Signed)
Ok per Dr. Kimberlee Nearing in PT aware.

## 2021-01-31 NOTE — Telephone Encounter (Signed)
  I think that this was ordered.  M

## 2021-02-01 NOTE — Telephone Encounter (Signed)
PT and SN have been approved at this time. Thanks.

## 2021-02-05 ENCOUNTER — Telehealth: Payer: Self-pay

## 2021-02-05 ENCOUNTER — Inpatient Hospital Stay: Payer: No Typology Code available for payment source | Attending: Hematology and Oncology

## 2021-02-05 NOTE — Telephone Encounter (Signed)
Nutrition Follow-up:  Patient with right base of the tongue cancer stage 1 b and adenocarcinoma of GE junction.  Patient has completed radiation (2/24) and chemotherapy.  Noted hospital admission (3/4-3/7) with bilateral PEs.    Spoke with wife and then RD call patient as not at home with wife.  Wife concerned and feels patient is not eating enough  Patient reports that his appetite is a little bit better. This am ate bacon, egg and toast and drank 30 g protein shake.  Ate pimento cheese sandwich for lunch.  Has been drinking 2-3 oral nutrition supplements per day, water and gatorade.  Denies trouble swallowing, taste is slowly coming back. Patient reports that he is receiving PT for foot drop.  Awaiting appointment with neurology.     Medications: reviewed  Labs: reviewed  Anthropometrics:   Weight 185 lb 15.3 oz on 3/10  196 lb on 2/23  196 lb on 2/21 191 lb on 2/16    NUTRITION DIAGNOSIS: Inadequate oral intake improved but weight declining, recent hospital visit   INTERVENTION:  Encouraged patient to set schedule to eat.   Discussed putting complete meal together and eating.   Encouraged oral nutrition supplements between meals, 3 if able to increase weight.     MONITORING, EVALUATION, GOAL: weight trends, intake   NEXT VISIT: Tuesday, April 5 phone call  Analeia Ismael B. Zenia Resides, [place], [place] Registered Dietitian [phone] (mobile)

## 2021-02-05 NOTE — Telephone Encounter (Signed)
Nutrition  Called patient for nutrition follow-up.  Left message on voicemail with call back number.  Zian Delair B. Zenia Resides, [place], [place] Registered Dietitian [phone] (mobile)

## 2021-02-07 NOTE — Progress Notes (Signed)
[facility]  [address], [address] [place], [place] [address] Phone: [phone]  Fax: [phone]   Clinic Day:  02/08/2021  Referring physician: Derinda Late, MD  Chief Complaint: Sean Garner. is a 75 y.o. 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  HPI:  The patient was last seen in the medical oncology clinic on 01/25/2021. At that time, he was doing well. He denied any bleeding on Eliquis. He had a new left foot drop. He was referred to neurology. He continued oral B12.  The patient and his wife spoke to Jennet Maduro, RD on 02/05/2021. The patient's wife was concerned that the patient was not eating enough. The patient was eating more and drinking 2-3 oral nutrition supplements per day. He was encouraged the patient to schedule meals and continue supplementation with shakes.  Echocardiogram is scheduled for 02/14/2021.  During the interim, he has been "better the last few days." His left foot drop has improved with physical therapy. He is able to invert and evert his foot.  He has not had any shortness of breath on exertion. His energy is better but not completely back to normal. He is eating better. He is drinking more Glucerna shakes. His throat is not sore anymore. He denies bruising or bleeding of any kind. He is taking Eliquis and vitamin B12.  He takes oral magnesium.   Past Medical History:  Diagnosis Date  . Anxiety   . Cancer [facility])    Head/throat Cancer at the base of the tongue  . Cataract    lt eye repair; present in rt eye but not ripe yet.  . Claustrophobia   . Diabetes mellitus without complication ([place])   . GERD (gastroesophageal reflux disease)   . Glaucoma    using gtts for this.  . Hypercholesterolemia   . Hypertension     Past Surgical History:  Procedure Laterality Date  . CATARACT EXTRACTION W/PHACO Left  12/03/2017   Procedure: CATARACT EXTRACTION PHACO AND INTRAOCULAR LENS PLACEMENT ([place]) COMPLICATED DIABETIC LEFT;  Surgeon: Leandrew Koyanagi, MD;  Location: [facility];  Service: Ophthalmology;  Laterality: Left;  Diabetic - oral meds  . CHOLECYSTECTOMY    . COLONOSCOPY  2016  . PORTA CATH INSERTION N/A 11/13/2020   Procedure: PORTA CATH INSERTION;  Surgeon: Algernon Huxley, MD;  Location: [facility];  Service: Cardiovascular;  Laterality: N/A;    Family History  Problem Relation Age of Onset  . Cancer Mother   . Colon cancer Neg Hx   . Colon polyps Neg Hx   . Esophageal cancer Neg Hx   . Rectal cancer Neg Hx   . Stomach cancer Neg Hx     Social History:  reports that he quit smoking about 36 years ago. He has never used smokeless tobacco. He reports previous alcohol use. He reports that he does not use drugs. He quit smoking cold [place] in 1986. He smoked 2 packs per day for 20 years. He drinks a glass of wine or a Margarita occasionally. 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 He was exposed to [employer]. He worked with a Geologist, engineering for 34 years. The patient is accompanied by his wife today.  Allergies: No Known Allergies  Current Medications: Current Outpatient Medications  Medication Sig Dispense Refill  . APIXABAN (ELIQUIS) VTE STARTER PACK (10MG  AND  5MG ) Take as directed on package: start with two-5mg  tablets twice daily for 7 days. On day 8 (01/29/2021), switch to one-5mg  tablet twice daily. 1 tablet 0  . brimonidine-timolol (COMBIGAN) 0.2-0.5 % ophthalmic solution Place 1 drop into the left eye daily.    . calcium carbonate (OSCAL) 1500 (600 Ca) MG TABS tablet Take by mouth 2 (two) times daily with a meal.    . citalopram (CELEXA) 20 MG tablet TAKE 1.5 TABLETS (30 MG TOTAL) BY MOUTH DAILY WITH BREAKFAST. 45 tablet 0  . esomeprazole (NEXIUM) 20 MG capsule Take 40 mg by mouth daily at 12 noon.    . fluticasone (FLONASE) 50 MCG/ACT nasal  spray Place into both nostrils.    Marland Kitchen glipiZIDE (GLUCOTROL XL) 5 MG 24 hr tablet Take 5 mg by mouth 2 (two) times daily.  1  . glucose blood test strip     . lidocaine-prilocaine (EMLA) cream Apply to affected area once 30 g 3  . metFORMIN (GLUCOPHAGE-XR) 500 MG 24 hr tablet Take 500 mg by mouth 2 (two) times daily. Takes 2 tablets and 1 tablet in the pm    . simvastatin (ZOCOR) 40 MG tablet Take 40 mg by mouth daily at 6 PM.    . traZODone (DESYREL) 50 MG tablet Take 0.5-1 tablets (25-50 mg total) by mouth at bedtime as needed for sleep. 30 tablet 2  . VITAMIN D PO Take 800 Units by mouth daily.    Marland Kitchen ALPRAZolam (XANAX) 0.25 MG tablet Take 1 tablet (0.25 mg total) by mouth at bedtime as needed for anxiety. (Patient not taking: Reported on 02/08/2021) 60 tablet 0  . dexamethasone (DECADRON) 4 MG tablet 2 TABS BY MOUTH DAILY. TAKE DAILY X 3 DAYS STARTING THE DAY AFTER CHEMOTHERAPY. TAKE WITH FOOD. (Patient not taking: Reported on 02/08/2021) 30 tablet 1  . magic mouthwash w/lidocaine SOLN Take 5 mLs by mouth. (Patient not taking: Reported on 02/08/2021)    . ondansetron (ZOFRAN) 8 MG tablet Take 1 tablet (8 mg total) by mouth 2 (two) times daily as needed. Start on the third day after cisplatin chemotherapy. (Patient not taking: No sig reported) 30 tablet 1  . sucralfate (CARAFATE) 1 g tablet Take 0.5 tablets (0.5 g total) by mouth 3 (three) times daily. Dissolve in warm water, swish and swallow (Patient not taking: No sig reported) 60 tablet 1   Current Facility-Administered Medications  Medication Dose Route Frequency Provider Last Rate Last Admin  . 0.9 %  sodium chloride infusion  500 mL Intravenous Once Pyrtle, Lajuan Lines, MD       Facility-Administered Medications Ordered in Other Visits  Medication Dose Route Frequency Provider Last Rate Last Admin  . 0.9 % NaCl with KCl 20 mEq/ L  infusion   Intravenous Once Boruch Manuele C, MD      . CISplatin (PLATINOL) 87 mg in sodium chloride 0.9 % 250 mL  chemo infusion  40 mg/m2 (Order-Specific) Intravenous Once Nolon Stalls C, MD      . dexamethasone (DECADRON) 10 mg in sodium chloride 0.9 % 50 mL IVPB  10 mg Intravenous Once Kerry Chisolm C, MD      . fosaprepitant (EMEND) 150 mg in sodium chloride 0.9 % 145 mL IVPB  150 mg Intravenous Once Candee Hoon C, MD      . magnesium sulfate IVPB 2 g 50 mL  2 g Intravenous Once Lequita Asal, MD        Review of Systems  Constitutional: Negative for  chills, diaphoresis, fever, malaise/fatigue and weight loss (stable).       Feels "better."  HENT: Positive for hearing loss. Negative for congestion, ear discharge, ear pain, nosebleeds, sinus pain, sore throat and tinnitus.   Eyes: Negative for blurred vision.  Respiratory: Negative for cough, hemoptysis, sputum production and shortness of breath.   Cardiovascular: Negative for chest pain, palpitations and leg swelling.  Gastrointestinal: Negative for abdominal pain, blood in stool, constipation, diarrhea, heartburn, melena, nausea and vomiting.       Eating better.  Genitourinary: Negative for dysuria, frequency, hematuria and urgency.  Musculoskeletal: Negative for back pain, joint pain, myalgias and neck pain.  Skin: Negative for itching and rash.  Neurological: Negative for dizziness, tingling, sensory change, weakness and headaches.       Left drop foot.  Endo/Heme/Allergies: Does not bruise/bleed easily.  Psychiatric/Behavioral: Negative for depression and memory loss. The patient is not nervous/anxious (on citalopram and Xanax) and does not have insomnia.   All other systems reviewed and are negative.  Performance status (ECOG): 1  Vitals Blood pressure 134/68, pulse 78, temperature (!) 96.5 F (35.8 C), temperature source Tympanic, resp. rate 18, weight 185 lb 8.3 oz (84.1 kg), SpO2 99 %.   Physical Exam Vitals and nursing note reviewed.  Constitutional:      General: He is not in acute distress.    Appearance: He  is not diaphoretic.  HENT:     Head: Normocephalic and atraumatic.     Comments: Gray hair.    Mouth/Throat:     Mouth: Mucous membranes are moist.     Pharynx: Oropharynx is clear.  Eyes:     General: No scleral icterus.    Extraocular Movements: Extraocular movements intact.     Conjunctiva/sclera: Conjunctivae normal.     Pupils: Pupils are equal, round, and reactive to light.     Comments: Blue eyes.  Cardiovascular:     Rate and Rhythm: Normal rate and regular rhythm.     Heart sounds: Normal heart sounds. No murmur heard.   Pulmonary:     Effort: Pulmonary effort is normal. No respiratory distress.     Breath sounds: Normal breath sounds. No wheezing or rales.  Chest:     Chest wall: No tenderness.  Breasts:     Right: No axillary adenopathy or supraclavicular adenopathy.     Left: No axillary adenopathy or supraclavicular adenopathy.    Abdominal:     General: Bowel sounds are normal. There is no distension.     Palpations: Abdomen is soft. There is no mass.     Tenderness: There is no abdominal tenderness. There is no guarding or rebound.  Musculoskeletal:        General: No swelling or tenderness. Normal range of motion.     Cervical back: Normal range of motion and neck supple.  Lymphadenopathy:     Head:     Right side of head: No preauricular, posterior auricular or occipital adenopathy.     Left side of head: No preauricular, posterior auricular or occipital adenopathy.     Upper Body:     Right upper body: No supraclavicular or axillary adenopathy.     Left upper body: No supraclavicular or axillary adenopathy.     Lower Body: No right inguinal adenopathy. No left inguinal adenopathy.  Skin:    General: Skin is warm and dry.     Comments: Tan around neck s/p radiation. Scar on right side of neck, fingertip sized.  Neurological:  Mental Status: He is alert and oriented to person, place, and time.     Comments: Left drop foot, improved.  Psychiatric:         Behavior: Behavior normal.        Thought Content: Thought content normal.        Judgment: Judgment normal.    Appointment on 02/08/2021  Component Date Value Ref Range Status  . Magnesium 02/08/2021 1.3* 1.7 - 2.4 mg/dL Final   Performed at [facility], [address], [place], [place] [address]  . Sodium 02/08/2021 135  135 - 145 mmol/L Final  . Potassium 02/08/2021 3.7  3.5 - 5.1 mmol/L Final  . Chloride 02/08/2021 102  98 - 111 mmol/L Final  . CO2 02/08/2021 26  22 - 32 mmol/L Final  . Glucose, Bld 02/08/2021 174* 70 - 99 mg/dL Final   Glucose reference range applies only to samples taken after fasting for at least 8 hours.  . BUN 02/08/2021 16  8 - 23 mg/dL Final  . Creatinine, Ser 02/08/2021 0.79  0.61 - 1.24 mg/dL Final  . Calcium 02/08/2021 9.7  8.9 - 10.3 mg/dL Final  . Total Protein 02/08/2021 7.2  6.5 - 8.1 g/dL Final  . Albumin 02/08/2021 4.1  3.5 - 5.0 g/dL Final  . AST 02/08/2021 24  15 - 41 U/L Final  . ALT 02/08/2021 18  0 - 44 U/L Final  . Alkaline Phosphatase 02/08/2021 50  38 - 126 U/L Final  . Total Bilirubin 02/08/2021 0.8  0.3 - 1.2 mg/dL Final  . GFR, Estimated 02/08/2021 >60  >60 mL/min Final   Comment: (NOTE) Calculated using the CKD-EPI Creatinine Equation (2021)   . Anion gap 02/08/2021 7  5 - 15 Final   Performed at [facility], [address] [place], [place] [address]  . WBC 02/08/2021 5.7  4.0 - 10.5 K/uL Final  . RBC 02/08/2021 3.50* 4.22 - 5.81 MIL/uL Final  . Hemoglobin 02/08/2021 11.3* 13.0 - 17.0 g/dL Final  . HCT 02/08/2021 33.5* 39.0 - 52.0 % Final  . MCV 02/08/2021 95.7  80.0 - 100.0 fL Final  . MCH 02/08/2021 32.3  26.0 - 34.0 pg Final  . MCHC 02/08/2021 33.7  30.0 - 36.0 g/dL Final  . RDW 02/08/2021 17.6* 11.5 - 15.5 % Final  . Platelets 02/08/2021 254  150 - 400 K/uL Final  . nRBC 02/08/2021 0.0  0.0 - 0.2 % Final  . Neutrophils Relative % 02/08/2021 79  % Final  . Neutro Abs 02/08/2021 4.5  1.7 -  7.7 K/uL Final  . Lymphocytes Relative 02/08/2021 10  % Final  . Lymphs Abs 02/08/2021 0.6* 0.7 - 4.0 K/uL Final  . Monocytes Relative 02/08/2021 7  % Final  . Monocytes Absolute 02/08/2021 0.4  0.1 - 1.0 K/uL Final  . Eosinophils Relative 02/08/2021 0  % Final  . Eosinophils Absolute 02/08/2021 0.0  0.0 - 0.5 K/uL Final  . Basophils Relative 02/08/2021 1  % Final  . Basophils Absolute 02/08/2021 0.0  0.0 - 0.1 K/uL Final  . Immature Granulocytes 02/08/2021 3  % Final  . Abs Immature Granulocytes 02/08/2021 0.16* 0.00 - 0.07 K/uL Final   Performed at [facility], [address], [place], [place] [address]    Assessment:  Sean Garner. is a 75 y.o. 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  He presented with right neck adenopathy.    Neck soft tissue CT on 09/28/2020 revealed a 3.5 x 3.0 x 4.8 cm right neck mass most c/w enlarged lymph node. There was a 14 mm enhancing mass in the right base of tongue.   PET scan on 10/25/2020 revealed right tongue base primary with ipsilateral level 2 nodal metastasis. There were no findings of extracervical metastatic disease. There was focal hypermetabolism in the distal esophagus, with possible concurrent soft tissue density lesion. Recommended correlation with endoscopy. There was vague right lower lobe low-level hypermetabolism and ground-glass nodularity, favored minimal infection or aspiration. Incidental findings included aortic atherosclerosis, coronary artery atherosclerosis, emphysema, a tiny hiatal hernia, and prostatomegaly.  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  Speech discrimination scores were 100% in each ear.  He received 6 weeks of cisplatin with concurrent radiation  (11/20/2020 - 12/27/2020).  He completed radiation on 01/11/2021.  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 There was no evidence of metastatic disease within the chest, abdomen or pelvis.  Improvement in previously demonstrated distal esophageal wall thickening without apparent focal mass lesion. There was aortic atherosclerosis and emphysema.  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  Upper endoscopy on 11/03/2020 revealed a malignant esophageal tumor at the gastroesophageal junction. There were esophageal mucosal changes c/w short-segment Barrett's esophagus. There was a 2 cm hiatal hernia. There was gastritis.  Pathology in the esophagus at 36 cm revealed intramucosal adenocarcinoma arising in a background of high grade dysplasia and intestinal metaplasia; pathology at 38 cm revealed adenocarcinoma.  EUS on 11/15/2020 revealed early stage adenocarcinoma arising from Barrett's esophagus.  Lesion was T1bN0 and non-obstructing.  He was diagnosed with bilateral pulmonary emboli on 01/19/2021.  Bilateral lower extremity duplex on 01/20/2021 revealed occlusive DVT in the right deep femoral vein.  revealed normal anti-cardiolipin antibodies and beta-2 glycoprotein antibodies.  Prothrombin gene mutation and factor 5 leiden are pending.   He has B12 deficiency.  B12 was 180 on 01/08/2021.  He is on oral B12.  He has PTSD and clastrophobia.  Anxiety was relieved by alprazolam prior to radiation.  He is on citalopram 30 mg a day and trazodone at night.  He was admitted to [facility] from 01/19/2021 - 01/22/2021 with bilateral pulmonary emboli. Bilateral lower extremity venous duplex was positive for occlusive DVT in the right deep femoral vein. He was placed on heparin drip and discharged on Eliquis.  Symptomatically, his  energy level has improved but is not back to normal.  He is eating better.  Throat pain has resolved.  His left foot drop has improved with physical therapy.   Plan: 1.   Labs today: CBC with diff, CMP, Mg, B12. 2.   Clinical stage I right base of tongue carcinoma Clinical stage I. 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 PET scan on 10/25/2020 revealed a right base of tongue primary with ipsilateral level 2 nodal metastasis. No evidence of distant metastasis.             He completed concurrent radiation and cisplatin on 01/11/2021.  Clinically, he is doing well.  Exam reveals a tiny right neck node s/p XRT.  Anticipate 55-month follow-up PET scan after completion of radiation (04/10/2021).  3. 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 EUS on 11/15/2020 revealed a T1bN0 lesion.  Chest, abdomen and pelvis CT on 01/19/2021 reveal improvement in the distal esophageal wall thickening.  He may be a candidate for endoscopic resection followed by ablation or esophagectomy.   Anticipate follow-up CT scan and upper endoscopy with ultrasound for restaging.  Follow-up with Mariea Clonts, RN regarding appointment with Dr. Keturah BarreAbigail Butts at [facility]. 4.   Bilateral pulmonary emboli and RLE DVT  Diagnosis discovered with reimaging studies for esophageal cancer.  Patient on Eliquis without bruising or bleeding.  Echo on 02/14/2021. 5.   Left foot drop  Foot drop is improving with physical therapy.  Contact nurse navigator, Francee Nodal at the [place] [phone]), regarding neurology consultation clearance. 6.   B12 deficiency  B12 was 180 on 01/08/2021 and 480 today.  B12 goal 400.  Continue oral B12. 7.    Hypomagnesemia  Magnesium 1.3.  He is currently taking magnesium oxide 400 mg a day  Magnesium 2 gm IV today.  Increase magnesium to 400 mg p.o. twice daily.  Etiology likely secondary to prior carboplatin. 8.   Flush port every 6-12 weeks. 9.   RTC in 1 week for labs (Mg) and +/- IV magnesium. 10.   RTC in 1 month for MD assessment, labs (CBC with diff, BMP, Mg) and +/- IV Mg.  I discussed the assessment and treatment plan with the patient.  The patient was provided an opportunity to ask questions and all were answered.  The patient agreed with the plan and demonstrated an understanding of the instructions.  The patient was advised to call back if the symptoms worsen or if the condition fails to improve as anticipated.  I provided 18 minutes of face-to-face time during this this encounter and > 50% was spent counseling as documented under my assessment and plan.  An additional 8 minutes were spent reviewing his chart (Epic and Care Everywhere) including notes, labs, and imaging studies.    Ervin Rothbauer C. Mike Gip, MD, PhD    02/08/2021, 10:50 AM   I, Mirian Mo Tufford, am acting as Education administrator for [employer]. Mike Gip, MD, PhD.  I, Sovereign Ramiro C. Mike Gip, MD, have reviewed the above documentation for accuracy and completeness, and I agree with the above.

## 2021-02-08 ENCOUNTER — Telehealth: Payer: Self-pay | Admitting: *Deleted

## 2021-02-08 ENCOUNTER — Inpatient Hospital Stay: Payer: Medicare Other

## 2021-02-08 ENCOUNTER — Other Ambulatory Visit: Payer: Self-pay

## 2021-02-08 ENCOUNTER — Encounter: Payer: Self-pay | Admitting: Hematology and Oncology

## 2021-02-08 ENCOUNTER — Inpatient Hospital Stay (HOSPITAL_BASED_OUTPATIENT_CLINIC_OR_DEPARTMENT_OTHER): Payer: Medicare Other | Admitting: Hematology and Oncology

## 2021-02-08 VITALS — BP 134/68 | HR 78 | Temp 96.5°F | Resp 18 | Wt 185.5 lb

## 2021-02-08 DIAGNOSIS — C01 Malignant neoplasm of base of tongue: Secondary | ICD-10-CM | POA: Diagnosis not present

## 2021-02-08 DIAGNOSIS — I82411 Acute embolism and thrombosis of right femoral vein: Secondary | ICD-10-CM | POA: Diagnosis not present

## 2021-02-08 DIAGNOSIS — I2699 Other pulmonary embolism without acute cor pulmonale: Secondary | ICD-10-CM

## 2021-02-08 DIAGNOSIS — E538 Deficiency of other specified B group vitamins: Secondary | ICD-10-CM

## 2021-02-08 DIAGNOSIS — C159 Malignant neoplasm of esophagus, unspecified: Secondary | ICD-10-CM

## 2021-02-08 DIAGNOSIS — M21372 Foot drop, left foot: Secondary | ICD-10-CM

## 2021-02-08 DIAGNOSIS — Z7189 Other specified counseling: Secondary | ICD-10-CM

## 2021-02-08 LAB — COMPREHENSIVE METABOLIC PANEL
ALT: 18 U/L (ref 0–44)
AST: 24 U/L (ref 15–41)
Albumin: 4.1 g/dL (ref 3.5–5.0)
Alkaline Phosphatase: 50 U/L (ref 38–126)
Anion gap: 7 (ref 5–15)
BUN: 16 mg/dL (ref 8–23)
CO2: 26 mmol/L (ref 22–32)
Calcium: 9.7 mg/dL (ref 8.9–10.3)
Chloride: 102 mmol/L (ref 98–111)
Creatinine, Ser: 0.79 mg/dL (ref 0.61–1.24)
GFR, Estimated: >60 mL/min (ref >60)
Glucose, Bld: 174 mg/dL — ABNORMAL HIGH (ref 70–99)
Potassium: 3.7 mmol/L (ref 3.5–5.1)
Sodium: 135 mmol/L (ref 135–145)
Total Bilirubin: 0.8 mg/dL (ref 0.3–1.2)
Total Protein: 7.2 g/dL (ref 6.5–8.1)

## 2021-02-08 LAB — CBC WITH DIFFERENTIAL/PLATELET
Abs Immature Granulocytes: 0.16 10*3/uL — ABNORMAL HIGH (ref 0.00–0.07)
Basophils Absolute: 0 10*3/uL (ref 0.0–0.1)
Basophils Relative: 1 %
Eosinophils Absolute: 0 10*3/uL (ref 0.0–0.5)
Eosinophils Relative: 0 %
HCT: 33.5 % — ABNORMAL LOW (ref 39.0–52.0)
Hemoglobin: 11.3 g/dL — ABNORMAL LOW (ref 13.0–17.0)
Immature Granulocytes: 3 %
Lymphocytes Relative: 10 %
Lymphs Abs: 0.6 10*3/uL — ABNORMAL LOW (ref 0.7–4.0)
MCH: 32.3 pg (ref 26.0–34.0)
MCHC: 33.7 g/dL (ref 30.0–36.0)
MCV: 95.7 fL (ref 80.0–100.0)
Monocytes Absolute: 0.4 10*3/uL (ref 0.1–1.0)
Monocytes Relative: 7 %
Neutro Abs: 4.5 10*3/uL (ref 1.7–7.7)
Neutrophils Relative %: 79 %
Platelets: 254 10*3/uL (ref 150–400)
RBC: 3.5 MIL/uL — ABNORMAL LOW (ref 4.22–5.81)
RDW: 17.6 % — ABNORMAL HIGH (ref 11.5–15.5)
WBC: 5.7 10*3/uL (ref 4.0–10.5)
nRBC: 0 % (ref 0.0–0.2)

## 2021-02-08 LAB — VITAMIN B12: Vitamin B-12: 480 pg/mL (ref 180–914)

## 2021-02-08 LAB — MAGNESIUM: Magnesium: 1.3 mg/dL — ABNORMAL LOW (ref 1.7–2.4)

## 2021-02-08 MED ORDER — SODIUM CHLORIDE 0.9% FLUSH
10.0000 mL | Freq: Once | INTRAVENOUS | Status: AC | PRN
  Administered 2021-02-08: 10 mL
  Filled 2021-02-08: qty 10

## 2021-02-08 MED ORDER — HEPARIN SOD (PORK) LOCK FLUSH 100 UNIT/ML IV SOLN
INTRAVENOUS | Status: AC
  Filled 2021-02-08: qty 5

## 2021-02-08 MED ORDER — SODIUM CHLORIDE 0.9 % IV SOLN
Freq: Once | INTRAVENOUS | Status: AC
  Filled 2021-02-08: qty 250

## 2021-02-08 MED ORDER — HEPARIN SOD (PORK) LOCK FLUSH 100 UNIT/ML IV SOLN
500.0000 [IU] | Freq: Once | INTRAVENOUS | Status: AC | PRN
  Administered 2021-02-08: 500 [IU]
  Filled 2021-02-08: qty 5

## 2021-02-08 MED ORDER — MAGNESIUM SULFATE 2 GM/50ML IV SOLN
2.0000 g | Freq: Once | INTRAVENOUS | Status: AC
Start: 2021-02-08 — End: 2021-02-08
  Administered 2021-02-08: 2 g via INTRAVENOUS
  Filled 2021-02-08: qty 50

## 2021-02-08 NOTE — Progress Notes (Signed)
Pt received 2g of mg of clinic today. Tolerated well.

## 2021-02-08 NOTE — Patient Instructions (Signed)
  Increase magnesium to 1 pill twice a day tomorrow.

## 2021-02-08 NOTE — Telephone Encounter (Signed)
Called kim at [phone] and got her voice mail. Left message that on 3/10 Dr. Mike Gip made a referral to neurology at [facility] in [place]. Pt all the sudden the day before the pt. Came to clinic he suddenly had left foot drop. He was referred to Dr. Manuella Ghazi, [facility], .  I left the last 4 diigits of social  # [phone]. Asked if she could call me back to get auth to send him to neurology. Left her my cell number

## 2021-02-08 NOTE — Progress Notes (Signed)
Patient here for oncology follow-up appointment, patient states he feels better

## 2021-02-09 ENCOUNTER — Telehealth: Payer: Self-pay

## 2021-02-09 ENCOUNTER — Telehealth (INDEPENDENT_AMBULATORY_CARE_PROVIDER_SITE_OTHER): Payer: Medicare Other | Admitting: Psychiatry

## 2021-02-09 ENCOUNTER — Other Ambulatory Visit: Payer: Self-pay

## 2021-02-09 ENCOUNTER — Encounter: Payer: Self-pay | Admitting: Psychiatry

## 2021-02-09 DIAGNOSIS — F4312 Post-traumatic stress disorder, chronic: Secondary | ICD-10-CM | POA: Diagnosis not present

## 2021-02-09 DIAGNOSIS — Z79899 Other long term (current) drug therapy: Secondary | ICD-10-CM

## 2021-02-09 DIAGNOSIS — F411 Generalized anxiety disorder: Secondary | ICD-10-CM | POA: Diagnosis not present

## 2021-02-09 MED ORDER — CITALOPRAM HYDROBROMIDE 20 MG PO TABS: 20.0000 mg | ORAL_TABLET | Freq: Every day | ORAL | 0 refills | Status: DC

## 2021-02-09 NOTE — Progress Notes (Signed)
Virtual Visit via Telephone Note  I connected with Sean Garner. on 02/09/21 at  9:30 AM EDT by telephone and verified that I am speaking with the correct person using two identifiers.  Location Provider Location : [facility] Patient Location : Home  Participants: Patient ,Spouse, Provider   I discussed the limitations, risks, security and privacy concerns of performing an evaluation and management service by telephone and the availability of in person appointments. I also discussed with the patient that there may be a patient responsible charge related to this service. The patient expressed understanding and agreed to proceed.    I discussed the assessment and treatment plan with the patient. The patient was provided an opportunity to ask questions and all were answered. The patient agreed with the plan and demonstrated an understanding of the instructions.   The patient was advised to call back or seek an in-person evaluation if the symptoms worsen or if the condition fails to improve as anticipated.   [place] MD OP Progress Note  02/09/2021 10:00 AM [place].  MRN:  [id number]  Chief Complaint:  Chief Complaint    Follow-up; Depression     HPI: Sean Wion. 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  Patient today reports he is currently making progress.  Reports his mood symptoms as improving.  He is often anxious about his health however he is coping okay.  He reports he tried the higher dosage of Celexa however it made him very drowsy.  He already struggles with fatigue during the day and did not want to feel more tired.  He hence went back to the Celexa 20 mg.  Patient reports sleep is overall okay.  Patient reports he had one appointment with his  therapist Ms. McKenzie and he currently does not believe he needs therapy sessions since he has multiple doctors appointments going on.  He reports he had an appointment with his oncologist recently and was told he is making progress.  He reports he developed a left foot drop recently and is currently awaiting follow-up on that.  Patient is currently in physical therapy.  Patient denies any suicidality, homicidality or perceptual disturbances.  Patient denies any other concerns today.  Visit Diagnosis:    ICD-10-CM   1. GAD (generalized anxiety disorder)  F41.1 citalopram (CELEXA) 20 MG tablet  2. Chronic post-traumatic stress disorder (PTSD)  F43.12 citalopram (CELEXA) 20 MG tablet  3. High risk medication use  Z79.899     Past Psychiatric History: I have reviewed past psychiatric history from my progress note on 01/01/2021  Past Medical History:  Past Medical History:  Diagnosis Date  . Anxiety   . Cancer [facility])    Head/throat Cancer at the base of the tongue  . Cataract    lt eye repair; present in rt eye but not ripe yet.  . Claustrophobia   . Diabetes mellitus without complication ([place])   . GERD (gastroesophageal reflux disease)   . Glaucoma    using gtts for this.  . Hypercholesterolemia   . Hypertension     Past Surgical History:  Procedure Laterality Date  . CATARACT EXTRACTION W/PHACO Left 12/03/2017   Procedure: CATARACT EXTRACTION PHACO AND INTRAOCULAR LENS PLACEMENT ([place]) COMPLICATED DIABETIC LEFT;  Surgeon: Leandrew Koyanagi, MD;  Location: [facility];  Service: Ophthalmology;  Laterality: Left;  Diabetic - oral meds  . CHOLECYSTECTOMY    . COLONOSCOPY  2016  . PORTA CATH INSERTION N/A 11/13/2020   Procedure: PORTA CATH INSERTION;  Surgeon: Algernon Huxley, MD;  Location: [place] CV [facility];  Service: Cardiovascular;  Laterality: N/A;    Family Psychiatric History: Reviewed family psychiatric history from my progress note on 01/01/2021  Family History:   Family History  Problem Relation Age of Onset  . Cancer Mother   . Colon cancer Neg Hx   . Colon polyps Neg Hx   . Esophageal cancer Neg Hx   . Rectal cancer Neg Hx   . Stomach cancer Neg Hx     Social History: Reviewed social history from my progress note on 01/01/2021 Social History   Socioeconomic History  . Marital status: Married    Spouse name: Not on file  . Number of children: Not on file  . Years of education: Not on file  . Highest education level: Not on file  Occupational History  . Not on file  Tobacco Use  . Smoking status: Former Smoker    Quit date: 1986    Years since quitting: 36.2  . Smokeless tobacco: Never Used  Vaping Use  . Vaping Use: Never used  Substance and Sexual Activity  . Alcohol use: Not Currently    Comment: occasional - 1 glass wine/month  . Drug use: Never  . Sexual activity: Not on file  Other Topics Concern  . Not on file  Social History Narrative  . Not on file   Social Determinants of Health   Financial Resource Strain: Not on file  Food Insecurity: Not on file  Transportation Needs: Not on file  Physical Activity: Not on file  Stress: Not on file  Social Connections: Not on file    Allergies: No Known Allergies  Metabolic Disorder Labs: Lab Results  Component Value Date   HGBA1C 8.1 (H) 01/20/2021   MPG 185.77 01/20/2021   No results found for: PROLACTIN No results found for: CHOL, TRIG, HDL, CHOLHDL, VLDL, LDLCALC Lab Results  Component Value Date   TSH 0.577 01/08/2021   TSH 2.077 11/27/2020    Therapeutic Level Labs: No results found for: LITHIUM No results found for: VALPROATE No components found for:  CBMZ  Current Medications: Current Outpatient Medications  Medication Sig Dispense Refill  . ALPRAZolam (XANAX) 0.25 MG tablet Take 1 tablet (0.25 mg total) by mouth at bedtime as needed for anxiety. (Patient not taking: Reported on 02/08/2021) 60 tablet 0  . APIXABAN (ELIQUIS) VTE STARTER PACK (10MG   AND 5MG ) Take as directed on package: start with two-5mg  tablets twice daily for 7 days. On day 8 (01/29/2021), switch to one-5mg  tablet twice daily. 1 tablet 0  . brimonidine-timolol (COMBIGAN) 0.2-0.5 % ophthalmic solution Place 1 drop into the left eye daily.    . calcium carbonate (OSCAL) 1500 (600 Ca) MG TABS tablet Take by mouth 2 (two) times daily with a meal.    . citalopram (CELEXA) 20 MG tablet Take 1 tablet (20 mg total) by mouth daily with breakfast. 90 tablet 0  . dexamethasone (DECADRON) 4 MG tablet 2 TABS BY MOUTH DAILY. TAKE DAILY X 3 DAYS STARTING THE DAY AFTER CHEMOTHERAPY. TAKE WITH FOOD. (Patient not taking: Reported on 02/08/2021) 30 tablet 1  . esomeprazole (NEXIUM) 20 MG capsule Take 40 mg by mouth daily at 12 noon.    . fluticasone (FLONASE) 50 MCG/ACT nasal spray Place  into both nostrils.    Marland Kitchen glipiZIDE (GLUCOTROL XL) 5 MG 24 hr tablet Take 5 mg by mouth 2 (two) times daily.  1  . glucose blood test strip     . lidocaine-prilocaine (EMLA) cream Apply to affected area once 30 g 3  . magic mouthwash w/lidocaine SOLN Take 5 mLs by mouth. (Patient not taking: Reported on 02/08/2021)    . metFORMIN (GLUCOPHAGE-XR) 500 MG 24 hr tablet Take 500 mg by mouth 2 (two) times daily. Takes 2 tablets and 1 tablet in the pm    . ondansetron (ZOFRAN) 8 MG tablet Take 1 tablet (8 mg total) by mouth 2 (two) times daily as needed. Start on the third day after cisplatin chemotherapy. (Patient not taking: No sig reported) 30 tablet 1  . simvastatin (ZOCOR) 40 MG tablet Take 40 mg by mouth daily at 6 PM.    . sucralfate (CARAFATE) 1 g tablet Take 0.5 tablets (0.5 g total) by mouth 3 (three) times daily. Dissolve in warm water, swish and swallow (Patient not taking: No sig reported) 60 tablet 1  . traZODone (DESYREL) 50 MG tablet Take 0.5-1 tablets (25-50 mg total) by mouth at bedtime as needed for sleep. 30 tablet 2  . VITAMIN D PO Take 800 Units by mouth daily.     Current Facility-Administered  Medications  Medication Dose Route Frequency Provider Last Rate Last Admin  . 0.9 %  sodium chloride infusion  500 mL Intravenous Once Pyrtle, Lajuan Lines, MD       Facility-Administered Medications Ordered in Other Visits  Medication Dose Route Frequency Provider Last Rate Last Admin  . 0.9 % NaCl with KCl 20 mEq/ L  infusion   Intravenous Once Corcoran, Melissa C, MD      . CISplatin (PLATINOL) 87 mg in sodium chloride 0.9 % 250 mL chemo infusion  40 mg/m2 (Order-Specific) Intravenous Once Nolon Stalls C, MD      . dexamethasone (DECADRON) 10 mg in sodium chloride 0.9 % 50 mL IVPB  10 mg Intravenous Once Corcoran, Melissa C, MD      . fosaprepitant (EMEND) 150 mg in sodium chloride 0.9 % 145 mL IVPB  150 mg Intravenous Once Corcoran, Melissa C, MD      . magnesium sulfate IVPB 2 g 50 mL  2 g Intravenous Once Lequita Asal, MD         Musculoskeletal: Strength & Muscle Tone: [place]: UTA Patient leans: N/A  Psychiatric Specialty Exam: Review of Systems  Constitutional: Positive for fatigue (Improving).  Psychiatric/Behavioral: The patient is nervous/anxious.   All other systems reviewed and are negative.   There were no vitals taken for this visit.There is no height or weight on file to calculate BMI.  General Appearance: UTA  Eye Contact:  UTA  Speech:  Clear and Coherent  Volume:  Normal  Mood:  Anxious coping well  Affect:  UTA  Thought Process:  Goal Directed and Descriptions of Associations: Intact  Orientation:  Full (Time, Place, and Person)  Thought Content: Logical   Suicidal Thoughts:  No  Homicidal Thoughts:  No  Memory:  Immediate;   Fair Recent;   Fair Remote;   Fair  Judgement:  Fair  Insight:  Fair  Psychomotor Activity:  UTA  Concentration:  Concentration: Fair and Attention Span: Fair  Recall:  [employer] of Knowledge: Fair  Language: Fair  Akathisia:  No  Handed:  Right  AIMS (if indicated): UTA  Assets:  Communication  Skills Desire for Improvement Housing Social Support Talents/Skills  ADL's:  Intact  Cognition: WNL  Sleep:  Fair   Screenings: GAD-7   Flowsheet Row Video Visit from 01/01/2021 in [place]  Total GAD-7 Score 12    PHQ2-9   Flowsheet Row Video Visit from 02/09/2021 in [place] Counselor from 01/09/2021 in [place] Video Visit from 01/01/2021 in [place] Nutrition from 07/02/2018 in [place]  PHQ-2 Total Score 0 3 3 1   PHQ-9 Total Score -- 9 9 --    Flowsheet Row ED to Hosp-Admission (Discharged) from 01/19/2021 in [place] [facility] Counselor from 01/09/2021 in [place] Video Visit from 01/01/2021 in [place]  C-SSRS RISK CATEGORY No Risk No Risk No Risk       Assessment and Plan: Rahmir Beever. 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  Patient is currently making progress.  Plan as noted below.  Plan GAD-improving Celexa, reduced dosage of 20 mg p.o. daily. Patient continues to be on Xanax which she takes only a few times a week. Patient continues to limit use Patient is noncompliant with CBT and reports he is not interested at this time.  Chronic PTSD-stable Trazodone 50 mg p.o. nightly as needed  High risk medication use-reviewed EKG dated 01/19/2021-sinus rhythm, borderline prolonged PR interval.  QTc 445.  RSR-in V1 or V2 probably normal variant.  Patient has upcoming echocardiogram scheduled.  Collateral information obtained from spouse who reports patient is making progress.  Follow-up in clinic in 6 to 8 weeks in  person.   I have spent at least 20 minutes non face to face with patient today which includes the time spent for preparing to see the patient  (e.g., review of test, records), ordering medications and test, psychoeducation and supportive psychotherapy, care coordination as well as documenting clinical information in electronic health record.     This note was generated in part or whole with voice recognition software. Voice recognition is usually quite accurate but there are transcription errors that can and very often do occur. I apologize for any typographical errors that were not detected and corrected.       Ursula Alert, MD 02/09/2021, 10:00 AM

## 2021-02-09 NOTE — Telephone Encounter (Signed)
Voicemail and email sent to navigator at [facility], Francee Nodal, to inquire if referral is still in place for rescheduling with thoracic surgery.

## 2021-02-12 ENCOUNTER — Telehealth: Payer: Self-pay

## 2021-02-12 NOTE — Telephone Encounter (Signed)
Spoke with Francee Nodal, navigator at the [facility]. Josem Kaufmann [id number], for Dr. Elenor Quinones is still approved. Message sent to [name] to reschedule appointment. Will follow up regarding appointment and notify Ms. Beryl Meager of appointment date.

## 2021-02-14 ENCOUNTER — Telehealth: Payer: Self-pay | Admitting: *Deleted

## 2021-02-14 ENCOUNTER — Other Ambulatory Visit: Payer: Self-pay

## 2021-02-14 ENCOUNTER — Ambulatory Visit (INDEPENDENT_AMBULATORY_CARE_PROVIDER_SITE_OTHER): Payer: No Typology Code available for payment source

## 2021-02-14 DIAGNOSIS — R0600 Dyspnea, unspecified: Secondary | ICD-10-CM | POA: Diagnosis not present

## 2021-02-14 DIAGNOSIS — I2602 Saddle embolus of pulmonary artery with acute cor pulmonale: Secondary | ICD-10-CM

## 2021-02-14 NOTE — Telephone Encounter (Signed)
Kim called back from [place] and said that she sent message to Dr. Garald Balding and he says that the pt. Can be seen here at [facility] for foot drop and he can send pt to neurology at [facility]. I called and spoke to the wife and she says that she understands above but his foot drop is better. He has some PT and doing exercises at home. They have app for PCP [facility] 4/14 and they will talk to that md and if things get worse they will contact [place]. I have spoke to Maudie Mercury to give her the update above.

## 2021-02-15 ENCOUNTER — Telehealth: Payer: Self-pay

## 2021-02-15 LAB — ECHOCARDIOGRAM COMPLETE
AR max vel: 2.37 cm2
AV Area VTI: 2.31 cm2
AV Area mean vel: 2.18 cm2
AV Mean grad: 6 mmHg
AV Peak grad: 11.3 mmHg
Ao pk vel: 1.68 m/s
Area-P 1/2: 4.06 cm2
S' Lateral: 3.3 cm
Single Plane A4C EF: 52.6 %

## 2021-02-15 NOTE — Telephone Encounter (Addendum)
Called and spoke with Dr. Florentina Jenny office. They will reach out to Mr. Quezada to arrange appointment. Will continue to follow.

## 2021-02-16 ENCOUNTER — Ambulatory Visit
Admission: RE | Admit: 2021-02-16 | Discharge: 2021-02-16 | Disposition: A | Discharge status: Home / Self Care | Payer: Medicare Other | Source: Ambulatory Visit | Attending: Radiation Oncology | Admitting: Radiation Oncology

## 2021-02-16 ENCOUNTER — Ambulatory Visit (INDEPENDENT_AMBULATORY_CARE_PROVIDER_SITE_OTHER): Payer: No Typology Code available for payment source | Admitting: Cardiovascular Disease

## 2021-02-16 ENCOUNTER — Other Ambulatory Visit: Payer: Self-pay

## 2021-02-16 ENCOUNTER — Encounter: Payer: Self-pay | Admitting: Radiation Oncology

## 2021-02-16 ENCOUNTER — Encounter: Payer: Self-pay | Admitting: Cardiovascular Disease

## 2021-02-16 VITALS — BP 136/58 | HR 65 | Ht 70.0 in | Wt 190.0 lb

## 2021-02-16 VITALS — BP 148/63 | HR 60 | Temp 95.7°F | Wt 189.9 lb

## 2021-02-16 DIAGNOSIS — Z9221 Personal history of antineoplastic chemotherapy: Secondary | ICD-10-CM | POA: Insufficient documentation

## 2021-02-16 DIAGNOSIS — Z923 Personal history of irradiation: Secondary | ICD-10-CM | POA: Diagnosis not present

## 2021-02-16 DIAGNOSIS — C01 Malignant neoplasm of base of tongue: Secondary | ICD-10-CM | POA: Insufficient documentation

## 2021-02-16 DIAGNOSIS — R0600 Dyspnea, unspecified: Secondary | ICD-10-CM

## 2021-02-16 DIAGNOSIS — I2694 Multiple subsegmental pulmonary emboli without acute cor pulmonale: Secondary | ICD-10-CM

## 2021-02-16 DIAGNOSIS — C160 Malignant neoplasm of cardia: Secondary | ICD-10-CM | POA: Diagnosis not present

## 2021-02-16 NOTE — Progress Notes (Signed)
Cardiology Office Note   Date:  02/16/2021   ID:  Sean Mccorkel., DOB September 09, 1946, MRN [id number]  PCP:  Derinda Late, MD  Cardiologist:   Kathlyn Sacramento, MD   No chief complaint on file.     History of Present Illness: Sean Cuffe. is a 75 y.o. male who was referred by Dr. Kurtis Bushman and the [facility] for cardiac evaluation given recent submassive pulmonary embolism and concern for right ventricular strain.  He has known history of stage I right base of tongue carcinoma and stage Ib adenocarcinoma of GE junction, anxiety, diabetes mellitus, GERD, hyperlipidemia and hypertension. He went for a routine CT scan of the chest and abdomen last month and was found to have incidental finding of  pulmonary embolism  with nearly occlusive pulmonary thromboembolic disease bilaterally with CT evidence of RV strain.  The patient was treated with anticoagulation.  His symptoms included shortness of breath that subsequently resolved.  He reports feeling back to baseline and has been able to do his yard work without any significant limitations.  He has history of remote tobacco use and quit more than 35 years ago.  He has been diabetic for 4 years. He had an echocardiogram done recently which showed an EF of 60 to 32%, grade 2 diastolic dysfunction, normal RV systolic function and mild pulmonary hypertension with peak systolic pressure of 37 mmHg.  He denies chest pain, palpitations or dizziness at this time.  He is tolerating anticoagulation with Eliquis with no side effects.  Past Medical History:  Diagnosis Date  . Anxiety   . Cancer [facility])    Head/throat Cancer at the base of the tongue  . Cataract    lt eye repair; present in rt eye but not ripe yet.  . Claustrophobia   . Diabetes mellitus without complication ([place])   . GERD (gastroesophageal reflux disease)   . Glaucoma    using gtts for this.  . Hypercholesterolemia   . Hypertension     Past Surgical History:  Procedure Laterality Date   . CATARACT EXTRACTION W/PHACO Left 12/03/2017   Procedure: CATARACT EXTRACTION PHACO AND INTRAOCULAR LENS PLACEMENT ([place]) COMPLICATED DIABETIC LEFT;  Surgeon: Leandrew Koyanagi, MD;  Location: [place];  Service: Ophthalmology;  Laterality: Left;  Diabetic - oral meds  . CHOLECYSTECTOMY    . COLONOSCOPY  2016  . PORTA CATH INSERTION N/A 11/13/2020   Procedure: PORTA CATH INSERTION;  Surgeon: Algernon Huxley, MD;  Location: [place] CV LAB;  Service: Cardiovascular;  Laterality: N/A;     Current Outpatient Medications  Medication Sig Dispense Refill  . ALPRAZolam (XANAX) 0.25 MG tablet Take 1 tablet (0.25 mg total) by mouth at bedtime as needed for anxiety. 60 tablet 0  . APIXABAN (ELIQUIS) VTE STARTER PACK (10MG  AND 5MG ) Take as directed on package: start with two-5mg  tablets twice daily for 7 days. On day 8 (01/29/2021), switch to one-5mg  tablet twice daily. 1 tablet 0  . brimonidine-timolol (COMBIGAN) 0.2-0.5 % ophthalmic solution Place 1 drop into the left eye daily.    . calcium carbonate (OSCAL) 1500 (600 Ca) MG TABS tablet Take by mouth 2 (two) times daily with a meal.    . citalopram (CELEXA) 20 MG tablet Take 1 tablet (20 mg total) by mouth daily with breakfast. 90 tablet 0  . dexamethasone (DECADRON) 4 MG tablet 2 TABS BY MOUTH DAILY. TAKE DAILY X 3 DAYS STARTING THE DAY AFTER CHEMOTHERAPY. TAKE WITH FOOD. 30 tablet 1  .  esomeprazole (NEXIUM) 20 MG capsule Take 40 mg by mouth daily at 12 noon.    . fluticasone (FLONASE) 50 MCG/ACT nasal spray Place into both nostrils.    Marland Kitchen glipiZIDE (GLUCOTROL XL) 5 MG 24 hr tablet Take 5 mg by mouth 2 (two) times daily.  1  . glucose blood test strip     . lidocaine-prilocaine (EMLA) cream Apply to affected area once 30 g 3  . magic mouthwash w/lidocaine SOLN Take 5 mLs by mouth.    . Magnesium Oxide (MAG-OXIDE) 200 MG TABS Take 400 mg by mouth daily.    . metFORMIN (GLUCOPHAGE-XR) 500 MG 24 hr tablet Take 500 mg by mouth 2 (two)  times daily. Takes 2 tablets and 1 tablet in the pm    . simvastatin (ZOCOR) 40 MG tablet Take 40 mg by mouth daily at 6 PM.    . sucralfate (CARAFATE) 1 g tablet Take 0.5 tablets (0.5 g total) by mouth 3 (three) times daily. Dissolve in warm water, swish and swallow 60 tablet 1  . traZODone (DESYREL) 50 MG tablet Take 0.5-1 tablets (25-50 mg total) by mouth at bedtime as needed for sleep. 30 tablet 2  . vitamin B-12 (CYANOCOBALAMIN) 1000 MCG tablet Take 1,000 mcg by mouth daily.    Marland Kitchen VITAMIN D PO Take 800 Units by mouth daily.     Current Facility-Administered Medications  Medication Dose Route Frequency Provider Last Rate Last Admin  . 0.9 %  sodium chloride infusion  500 mL Intravenous Once Pyrtle, Lajuan Lines, MD       Facility-Administered Medications Ordered in Other Visits  Medication Dose Route Frequency Provider Last Rate Last Admin  . 0.9 % NaCl with KCl 20 mEq/ L  infusion   Intravenous Once Corcoran, Melissa C, MD      . CISplatin (PLATINOL) 87 mg in sodium chloride 0.9 % 250 mL chemo infusion  40 mg/m2 (Order-Specific) Intravenous Once Nolon Stalls C, MD      . dexamethasone (DECADRON) 10 mg in sodium chloride 0.9 % 50 mL IVPB  10 mg Intravenous Once Corcoran, Melissa C, MD      . fosaprepitant (EMEND) 150 mg in sodium chloride 0.9 % 145 mL IVPB  150 mg Intravenous Once Corcoran, Melissa C, MD      . magnesium sulfate IVPB 2 g 50 mL  2 g Intravenous Once Lequita Asal, MD        Allergies:   Patient has no known allergies.    Social History:  The patient  reports that he quit smoking about 36 years ago. He has never used smokeless tobacco. He reports previous alcohol use. He reports that he does not use drugs.   Family History:  The patient's family history includes Cancer in his mother.    ROS:  Please see the history of present illness.   Otherwise, review of systems are positive for none.   All other systems are reviewed and negative.    PHYSICAL EXAM: VS:  BP (!)  136/58 (BP Location: Right Arm, Patient Position: Sitting, Cuff Size: Normal)   Pulse 65   Ht 5\' 10"  (1.778 m)   Wt 190 lb (86.2 kg)   SpO2 98%   BMI 27.26 kg/m  , BMI Body mass index is 27.26 kg/m. GEN: Well nourished, well developed, in no acute distress  HEENT: normal  Neck: no JVD, carotid bruits, or masses Cardiac: RRR; no murmurs, rubs, or gallops,no edema  Respiratory:  clear to auscultation bilaterally,  normal work of breathing GI: soft, nontender, nondistended, + BS MS: no deformity or atrophy  Skin: warm and dry, no rash Neuro:  Strength and sensation are intact Psych: euthymic mood, full affect   EKG:  EKG is ordered today. The ekg ordered today demonstrates normal sinus rhythm with no significant ST or T wave changes.   Recent Labs: 01/08/2021: TSH 0.577 01/19/2021: B Natriuretic Peptide 221.1 02/08/2021: ALT 18; BUN 16; Creatinine, Ser 0.79; Hemoglobin 11.3; Magnesium 1.3; Platelets 254; Potassium 3.7; Sodium 135    Lipid Panel No results found for: CHOL, TRIG, HDL, CHOLHDL, VLDL, LDLCALC, LDLDIRECT    Wt Readings from Last 3 Encounters:  02/16/21 190 lb (86.2 kg)  02/08/21 185 lb 8.3 oz (84.1 kg)  01/25/21 185 lb 15.3 oz (84.4 kg)     No flowsheet data found.    ASSESSMENT AND PLAN:  1.  Bilateral pulmonary embolism with right ventricular strain: The patient has done extremely well with anticoagulation.  Recent echocardiogram showed normal RV function and size and only mild pulmonary hypertension.  That indicates significant RV function recovery. Currently with no symptoms of heart failure or angina.  He is doing well with anticoagulation. No further cardiac evaluation is recommended. Suspect that his pulmonary embolism was in the setting of hypercoagulable state related to underlying cancer.  Length of anticoagulation is likely at least 6 months but lifelong anticoagulation might need to be considered given large burden of thrombus.  2.  Tongue cancer and  GE cancer: Managed by oncology.    Disposition:   FU with ms as needed.  Signed,  Kathlyn Sacramento, MD  02/16/2021 10:09 AM    [facility]

## 2021-02-16 NOTE — Patient Instructions (Signed)
Medication Instructions:  Your physician recommends that you continue on your current medications as directed. Please refer to the Current Medication list given to you today.  *If you need a refill on your cardiac medications before your next appointment, please call your pharmacy*   Lab Work: None ordered If you have labs (blood work) drawn today and your tests are completely normal, you will receive your results only by: . MyChart Message (if you have MyChart) OR . A paper copy in the mail If you have any lab test that is abnormal or we need to change your treatment, we will call you to review the results.   Testing/Procedures: None ordered   Follow-Up: At [facility], you and your health needs are our priority.  As part of our continuing mission to provide you with exceptional heart care, we have created designated Provider Care Teams.  These Care Teams include your primary Cardiologist (physician) and Advanced Practice Providers (APPs -  Physician Assistants and Nurse Practitioners) who all work together to provide you with the care you need, when you need it.  We recommend signing up for the patient portal called "MyChart".  Sign up information is provided on this After Visit Summary.  MyChart is used to connect with patients for Virtual Visits (Telemedicine).  Patients are able to view lab/test results, encounter notes, upcoming appointments, etc.  Non-urgent messages can be sent to your provider as well.   To learn more about what you can do with MyChart, go to [email or web address].    Your next appointment:   As needed   The format for your next appointment:   In Person  Provider:   You may see Muhammad Arida, MD or one of the following Advanced Practice Providers on your designated Care Team:    Christopher Berge, NP  Ryan Dunn, PA-C  Jacquelyn Visser, PA-C  Cadence Furth, PA-C  Caitlin Walker, NP    Other Instructions N/A  

## 2021-02-16 NOTE — Progress Notes (Signed)
Radiation Oncology Follow up Note  Name: Sean Garner.   Date:   02/16/2021 MRN:  [id number] DOB: [birth year]    This 75 y.o. 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  Patient also has a stage Ib adenocarcinoma the GE junction which seems to have responded to chemotherapy it is being followed at [facility] for possible endoscopic resection  COMPLICATIONS OF TREATMENT: none  FOLLOW UP COMPLIANCE: keeps appointments   PHYSICAL EXAM:  BP (!) 148/63   Pulse 60   Temp (!) 95.7 F (35.4 C) (Tympanic)   Wt 189 lb 14.4 oz (86.1 kg)   BMI 27.25 kg/m  No evidence of cervical adenopathy is identified.  Well-developed well-nourished patient in NAD. HEENT reveals PERLA, EOMI, discs not visualized.  Oral cavity is clear. No oral mucosal lesions are identified. Neck is clear without evidence of cervical or supraclavicular adenopathy. Lungs are clear to A&P. Cardiac examination is essentially unremarkable with regular rate and rhythm without murmur rub or thrill. Abdomen is benign with no organomegaly or masses noted. Motor sensory and DTR levels are equal and symmetric in the upper and lower extremities. Cranial nerves II through XII are grossly intact. Proprioception is intact. No peripheral adenopathy or edema is identified. No motor or sensory levels are noted. Crude visual fields are within normal range.  RADIOLOGY RESULTS: No current films for  review  PLAN: Present time patient is doing well recovering nicely from his concurrent chemoradiation.  Of asked him to make a follow-up appoint with Dr. Tami Ribas for continuation of care.  He also will be seeing [name] for evaluation and continued monitoring of his GE junction adenocarcinoma.  I have asked to see him back in 4 to 5 months for follow-up.  Expect a repeat PET CT scan in the near future which I will review when available.  Patient knows to call with any concerns.  I would like to take this opportunity to thank you for allowing me to participate in the care of your patient.Noreene Filbert, MD

## 2021-02-19 ENCOUNTER — Inpatient Hospital Stay: Payer: Medicare Other

## 2021-02-19 ENCOUNTER — Inpatient Hospital Stay: Payer: Medicare Other | Attending: Hematology and Oncology

## 2021-02-19 ENCOUNTER — Other Ambulatory Visit: Payer: Self-pay

## 2021-02-19 ENCOUNTER — Other Ambulatory Visit: Payer: No Typology Code available for payment source

## 2021-02-19 ENCOUNTER — Ambulatory Visit: Payer: No Typology Code available for payment source | Admitting: Hematology and Oncology

## 2021-02-19 DIAGNOSIS — C160 Malignant neoplasm of cardia: Secondary | ICD-10-CM | POA: Insufficient documentation

## 2021-02-19 DIAGNOSIS — C775 Secondary and unspecified malignant neoplasm of intrapelvic lymph nodes: Secondary | ICD-10-CM | POA: Insufficient documentation

## 2021-02-19 DIAGNOSIS — C01 Malignant neoplasm of base of tongue: Secondary | ICD-10-CM | POA: Diagnosis present

## 2021-02-19 DIAGNOSIS — M7989 Other specified soft tissue disorders: Secondary | ICD-10-CM | POA: Diagnosis not present

## 2021-02-19 DIAGNOSIS — C159 Malignant neoplasm of esophagus, unspecified: Secondary | ICD-10-CM

## 2021-02-19 DIAGNOSIS — E538 Deficiency of other specified B group vitamins: Secondary | ICD-10-CM | POA: Insufficient documentation

## 2021-02-19 DIAGNOSIS — I2699 Other pulmonary embolism without acute cor pulmonale: Secondary | ICD-10-CM | POA: Diagnosis not present

## 2021-02-19 DIAGNOSIS — I82411 Acute embolism and thrombosis of right femoral vein: Secondary | ICD-10-CM | POA: Insufficient documentation

## 2021-02-19 DIAGNOSIS — Z87891 Personal history of nicotine dependence: Secondary | ICD-10-CM | POA: Diagnosis not present

## 2021-02-19 DIAGNOSIS — Z7901 Long term (current) use of anticoagulants: Secondary | ICD-10-CM | POA: Diagnosis not present

## 2021-02-19 DIAGNOSIS — M21372 Foot drop, left foot: Secondary | ICD-10-CM | POA: Insufficient documentation

## 2021-02-19 LAB — MAGNESIUM: Magnesium: 2 mg/dL (ref 1.7–2.4)

## 2021-02-19 NOTE — Progress Notes (Signed)
Magnesium 2.0 today.  No infusion per ordered parameters

## 2021-02-20 ENCOUNTER — Inpatient Hospital Stay: Payer: Medicare Other

## 2021-02-20 ENCOUNTER — Telehealth: Payer: Self-pay

## 2021-02-20 NOTE — Telephone Encounter (Signed)
Mr. Cutshaw has been scheduled to see Dr. Elenor Quinones 03/13/21 at 1000. Notified Francee Nodal, navigator at the [facility] for her follow up.

## 2021-02-20 NOTE — Progress Notes (Signed)
Nutrition Follow-up:  Patient with right base of tongue cancer stage 1 b and adenocarcinoma of GE junction.  Patient has completed radiation (2/24) and chemotherapy.    Spoke with patient via phone for nutrition follow-up.  Patient reports that throat is not sore anymore.  Taste buds are still not back yet and has cotton mouth which effects eating.  Reports ate 2 helpings of spaghetti last night, salad and bread. This am ate 2 egg omelette with 4 pieces of bacon, toast and coffee.  Working with home PT. Waiting on appointment with [facility] surgeon.  Drinking less ensure/boost shakes thinks they may have been causing some diarrhea.      Medications: reviewed  Labs: reviewed  Anthropometrics:   Weight 190 lb on 4/1 increased  185 lb 15.3 oz on 3/10 196 lb on 2/23 196 lb on 2/21 191 lb on 2/16   NUTRITION DIAGNOSIS: Inadequate oral intake improved   INTERVENTION:  Patient to continue eating good sources of protein and well balanced diet to maintain weight.  Contact information provided to patient. Patient will contact RD if needed in the future    NEXT VISIT: no follow-up planned RD available as needed  Remona Boom B. Zenia Resides, [place], [place] Registered Dietitian [phone] (mobile)

## 2021-03-08 NOTE — Progress Notes (Signed)
[facility]  [address] [place], [place] [address] Phone: [phone]  Fax: [phone]   Clinic Day:  03/08/2021  Referring physician: Derinda Late, MD  Chief Complaint: Sean Garner. is a 75 y.o. 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  HPI:  The patient was last seen in the medical oncology clinic on 02/08/2021. At that time, his energy level has improved but is not back to normal.  He is eating better.  Throat pain has resolved.  His left foot drop has improved with physical therapy. Hematocrit was 33.5, hemoglobin 11.3, MCV 95.7, platelets 254,000, WBC 5,700. CMP was normal except for a glucose of 174. Vitamin B12 was 480. Magnesium was 1.3. He continued Eliquis and vitamin B12. He was to increase magnesium to 400 mg BID. He received 2 gm IV magnesium.  The patient spoke to Jennet Maduro, RD on 02/20/2021. His sore throat had resolved but his taste was not quite back to normal. He was eating well. He was to follow-up prn.  Magnesium was 2.0 on 02/19/2021.  During the interim, Mr. Liford has done fair.  Admits to continued metallic taste in his mouth.  Sore throat has resolved.  Appetite is stable.  Left foot drop has improved since PT.  Developed right sided leg swelling and tenderness to right ankle.  Denies any injury.  Denies overuse.  Was at the beach last week and did do a fair amount of walking.  Stable on magnesium supplements denies any diarrhea.  Denies weight loss.  He was seen by ENT last week and given a good report. Plan is still be evaluated    Past Medical History:  Diagnosis Date  . Anxiety   . Cancer [facility])    Head/throat Cancer at the base of the tongue  . Cataract    lt eye repair; present in rt eye but not ripe yet.  . Claustrophobia   . Diabetes mellitus without complication ([place])   . GERD (gastroesophageal  reflux disease)   . Glaucoma    using gtts for this.  . Hypercholesterolemia   . Hypertension     Past Surgical History:  Procedure Laterality Date  . CATARACT EXTRACTION W/PHACO Left 12/03/2017   Procedure: CATARACT EXTRACTION PHACO AND INTRAOCULAR LENS PLACEMENT ([place]) COMPLICATED DIABETIC LEFT;  Surgeon: Leandrew Koyanagi, MD;  Location: [place];  Service: Ophthalmology;  Laterality: Left;  Diabetic - oral meds  . CHOLECYSTECTOMY    . COLONOSCOPY  2016  . PORTA CATH INSERTION N/A 11/13/2020   Procedure: PORTA CATH INSERTION;  Surgeon: Algernon Huxley, MD;  Location: [place] CV LAB;  Service: Cardiovascular;  Laterality: N/A;    Family History  Problem Relation Age of Onset  . Cancer Mother   . Colon cancer Neg Hx   . Colon polyps Neg Hx   . Esophageal cancer Neg Hx   . Rectal cancer Neg Hx   . Stomach cancer Neg Hx     Social History:  reports that he quit smoking about 36 years ago. He has never used smokeless tobacco. He reports previous alcohol use. He reports that he does not use drugs. He quit smoking cold [place] in 1986. He smoked 2 packs per day for 20 years. He drinks a glass of wine or a Margarita occasionally. 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 He was  exposed to [employer]. He worked with a Geologist, engineering for 34 years. The patient is accompanied by his wife today.  Allergies: No Known Allergies  Current Medications: Current Outpatient Medications  Medication Sig Dispense Refill  . ALPRAZolam (XANAX) 0.25 MG tablet Take 1 tablet (0.25 mg total) by mouth at bedtime as needed for anxiety. 60 tablet 0  . APIXABAN (ELIQUIS) VTE STARTER PACK (10MG  AND 5MG ) Take as directed on package: start with two-5mg  tablets twice daily for 7 days. On day 8 (01/29/2021), switch to one-5mg  tablet twice daily. 1 tablet 0  . brimonidine-timolol (COMBIGAN) 0.2-0.5 % ophthalmic solution Place 1 drop into the left eye daily.    . calcium carbonate (OSCAL)  1500 (600 Ca) MG TABS tablet Take by mouth 2 (two) times daily with a meal.    . citalopram (CELEXA) 20 MG tablet Take 1 tablet (20 mg total) by mouth daily with breakfast. 90 tablet 0  . dexamethasone (DECADRON) 4 MG tablet 2 TABS BY MOUTH DAILY. TAKE DAILY X 3 DAYS STARTING THE DAY AFTER CHEMOTHERAPY. TAKE WITH FOOD. 30 tablet 1  . esomeprazole (NEXIUM) 20 MG capsule Take 40 mg by mouth daily at 12 noon.    . fluticasone (FLONASE) 50 MCG/ACT nasal spray Place into both nostrils.    Marland Kitchen glipiZIDE (GLUCOTROL XL) 5 MG 24 hr tablet Take 5 mg by mouth 2 (two) times daily.  1  . glucose blood test strip     . lidocaine-prilocaine (EMLA) cream Apply to affected area once 30 g 3  . magic mouthwash w/lidocaine SOLN Take 5 mLs by mouth.    . Magnesium Oxide (MAG-OXIDE) 200 MG TABS Take 400 mg by mouth daily.    . metFORMIN (GLUCOPHAGE-XR) 500 MG 24 hr tablet Take 500 mg by mouth 2 (two) times daily. Takes 2 tablets and 1 tablet in the pm    . simvastatin (ZOCOR) 40 MG tablet Take 40 mg by mouth daily at 6 PM.    . sucralfate (CARAFATE) 1 g tablet Take 0.5 tablets (0.5 g total) by mouth 3 (three) times daily. Dissolve in warm water, swish and swallow 60 tablet 1  . traZODone (DESYREL) 50 MG tablet Take 0.5-1 tablets (25-50 mg total) by mouth at bedtime as needed for sleep. 30 tablet 2  . vitamin B-12 (CYANOCOBALAMIN) 1000 MCG tablet Take 1,000 mcg by mouth daily.    Marland Kitchen VITAMIN D PO Take 800 Units by mouth daily.     Current Facility-Administered Medications  Medication Dose Route Frequency Provider Last Rate Last Admin  . 0.9 %  sodium chloride infusion  500 mL Intravenous Once Pyrtle, Lajuan Lines, MD       Facility-Administered Medications Ordered in Other Visits  Medication Dose Route Frequency Provider Last Rate Last Admin  . 0.9 % NaCl with KCl 20 mEq/ L  infusion   Intravenous Once Corcoran, Melissa C, MD      . CISplatin (PLATINOL) 87 mg in sodium chloride 0.9 % 250 mL chemo infusion  40 mg/m2  (Order-Specific) Intravenous Once Lequita Asal, MD      . dexamethasone (DECADRON) 10 mg in sodium chloride 0.9 % 50 mL IVPB  10 mg Intravenous Once Corcoran, Melissa C, MD      . fosaprepitant (EMEND) 150 mg in sodium chloride 0.9 % 145 mL IVPB  150 mg Intravenous Once Corcoran, Melissa C, MD      . magnesium sulfate IVPB 2 g 50 mL  2 g Intravenous Once Corcoran,  Drue Second, MD        Review of Systems  Constitutional: Negative.  Negative for chills, fever, malaise/fatigue and weight loss.  HENT: Negative for congestion, ear pain and tinnitus.   Eyes: Negative.  Negative for blurred vision and double vision.  Respiratory: Negative.  Negative for cough, sputum production and shortness of breath.   Cardiovascular: Positive for leg swelling (Right leg ). Negative for chest pain and palpitations.  Gastrointestinal: Negative.  Negative for abdominal pain, constipation, diarrhea, nausea and vomiting.  Genitourinary: Negative for dysuria, frequency and urgency.  Musculoskeletal: Negative for back pain and falls.  Skin: Negative.  Negative for rash.  Neurological: Negative.  Negative for weakness and headaches.  Endo/Heme/Allergies: Negative.  Does not bruise/bleed easily.  Psychiatric/Behavioral: Negative.  Negative for depression. The patient is not nervous/anxious and does not have insomnia.    Performance status (ECOG): 1  Vitals There were no vitals taken for this visit.   Physical Exam Vitals and nursing note reviewed.  Constitutional:      General: He is not in acute distress.    Appearance: He is not diaphoretic.  HENT:     Head: Normocephalic and atraumatic.     Comments: Gray hair.    Mouth/Throat:     Mouth: Mucous membranes are moist.     Pharynx: Oropharynx is clear.  Eyes:     General: No scleral icterus.    Extraocular Movements: Extraocular movements intact.     Conjunctiva/sclera: Conjunctivae normal.     Pupils: Pupils are equal, round, and reactive to light.      Comments: Blue eyes.  Cardiovascular:     Rate and Rhythm: Normal rate and regular rhythm.     Heart sounds: Normal heart sounds. No murmur heard.   Pulmonary:     Effort: Pulmonary effort is normal. No respiratory distress.     Breath sounds: Normal breath sounds. No wheezing or rales.  Chest:     Chest wall: No tenderness.  Breasts:     Right: No axillary adenopathy or supraclavicular adenopathy.     Left: No axillary adenopathy or supraclavicular adenopathy.    Abdominal:     General: Bowel sounds are normal. There is no distension.     Palpations: Abdomen is soft. There is no mass.     Tenderness: There is no abdominal tenderness. There is no guarding or rebound.  Musculoskeletal:        General: No swelling or tenderness. Normal range of motion.     Cervical back: Normal range of motion and neck supple.     Right lower leg: Swelling present.     Right ankle: Swelling present.  Lymphadenopathy:     Head:     Right side of head: No preauricular, posterior auricular or occipital adenopathy.     Left side of head: No preauricular, posterior auricular or occipital adenopathy.     Upper Body:     Right upper body: No supraclavicular or axillary adenopathy.     Left upper body: No supraclavicular or axillary adenopathy.     Lower Body: No right inguinal adenopathy. No left inguinal adenopathy.  Skin:    General: Skin is warm and dry.     Comments: Tan around neck s/p radiation. Scar on right side of neck, fingertip sized.  Neurological:     Mental Status: He is alert and oriented to person, place, and time.     Comments: Left drop foot, improved.  Psychiatric:  Behavior: Behavior normal.        Thought Content: Thought content normal.        Judgment: Judgment normal.    No visits with results within 3 Day(s) from this visit.  Latest known visit with results is:  Appointment on 02/19/2021  Component Date Value Ref Range Status  . Magnesium 02/19/2021 2.0  1.7  - 2.4 mg/dL Final   Performed at [facility], [address] [place], [place] [address]    Assessment:  Sierra Bissonette. is a 75 y.o. 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  He presented with right neck adenopathy.    Neck soft tissue CT on 09/28/2020 revealed a 3.5 x 3.0 x 4.8 cm right neck mass most c/w enlarged lymph node. There was a 14 mm enhancing mass in the right base of tongue.   PET scan on 10/25/2020 revealed right tongue base primary with ipsilateral level 2 nodal metastasis. There were no findings of extracervical metastatic disease. There was focal hypermetabolism in the distal esophagus, with possible concurrent soft tissue density lesion. Recommended correlation with endoscopy. There was vague right lower lobe low-level hypermetabolism and ground-glass nodularity, favored minimal infection or aspiration. Incidental findings included aortic atherosclerosis, coronary artery atherosclerosis, emphysema, a tiny hiatal hernia, and prostatomegaly.  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  Speech discrimination scores were 100% in each ear.  He received 6 weeks of cisplatin with concurrent radiation (11/20/2020 - 12/27/2020).  He completed radiation on 01/11/2021.  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 There was no evidence of metastatic disease within the chest, abdomen or pelvis.  Improvement in previously demonstrated distal esophageal wall thickening without apparent focal mass lesion. There was aortic atherosclerosis and  emphysema.  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  Upper endoscopy on 11/03/2020 revealed a malignant esophageal tumor at the gastroesophageal junction. There were esophageal mucosal changes c/w short-segment Barrett's esophagus. There was a 2 cm hiatal hernia. There was gastritis.  Pathology in the esophagus at 36 cm revealed intramucosal adenocarcinoma arising in a background of high grade dysplasia and intestinal metaplasia; pathology at 38 cm revealed adenocarcinoma.  EUS on 11/15/2020 revealed early stage adenocarcinoma arising from Barrett's esophagus.  Lesion was T1bN0 and non-obstructing.  He was diagnosed with bilateral pulmonary emboli on 01/19/2021.  Bilateral lower extremity duplex on 01/20/2021 revealed occlusive DVT in the right deep femoral vein.  revealed normal anti-cardiolipin antibodies and beta-2 glycoprotein antibodies.  Prothrombin gene mutation and factor 5 leiden are pending.   He has B12 deficiency.  B12 was 180 on 01/08/2021.  He is on oral B12.  He has PTSD and clastrophobia.  Anxiety was relieved by alprazolam prior to radiation.  He is on citalopram 30 mg a day and trazodone at night.  He was admitted to [facility] from 01/19/2021 - 01/22/2021 with bilateral pulmonary emboli. Bilateral lower extremity venous duplex was positive for occlusive DVT in the right deep femoral vein. He was placed on heparin drip and discharged on Eliquis.  Symptomatically,   Plan: 1.   Labs today: CBC with diff, BMP, Mg  Labs  from 03/12/2021 show hemoglobin 11.7, MCV 95, hematocrit 33.9.  Metabolic panel essentially negative. 2.   Clinical stage I right base of tongue carcinoma Clinical stage I. 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 PET scan on 10/25/2020 revealed a right base of tongue primary with ipsilateral level 2 nodal metastasis. No evidence of distant metastasis.             He completed concurrent radiation and cisplatin on  01/11/2021.  Clinically, he is doing well.   Schedule PET scan for next month or so (approximately 3 months from treatment).   3. 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 EUS on 11/15/2020 revealed a T1bN0 lesion.  Chest, abdomen and pelvis CT on 01/19/2021 reveal improvement in the distal esophageal wall thickening.  He may be a candidate for endoscopic resection followed by ablation or esophagectomy.  Follow-up with Mariea Clonts, RN regarding appointment with Dr. Keturah [facility]reAbigail Butts at [facility]. 4.   Bilateral pulmonary emboli and RLE DVT  Diagnosis discovered with reimaging studies for esophageal cancer.  Patient on Eliquis without bruising or bleeding.  Echo on 02/14/2021. 5.   Left foot drop  Foot drop is improving with physical therapy.  Contact nurse navigator, Francee Nodal at the [place] [phone]), regarding neurology consultation clearance. 6.   B12 deficiency  B12 was 480 on 02/08/21.  B12 goal 400.  Continue oral B12. 7.   Hypomagnesemia  Magnesium 1.6.  He is currently taking magnesium oxide 400 mg a day  No IV magnesium needed today.  Continue oral magnesium 400 mg p.o. twice daily.  Etiology likely secondary to prior carboplatin. 8. Right leg swelling  Prior history of DVT in right leg  Has been compliant with Eliquis.  Denies injury.  Given history will get right lower extremity ultrasound to rule out blood clot.  Results of imaging were negative for recurrent blood clot.  Continue Eliquis. 9.   PET scan in 1 month. 10.  RTC after PET scan to review results with labs (CBC with differential, BMP, magnesium).  I discussed the assessment and treatment plan with the patient.  The patient was provided an opportunity to ask questions and all  were answered.  The patient agreed with the plan and demonstrated an understanding of the instructions.  The patient was advised to call back if the symptoms worsen or if the condition fails to improve as anticipated.  Greater than 50% was spent in counseling and coordination of care with this patient including but not limited to discussion of the relevant topics above (See A&P) including, but not limited to diagnosis and management of acute and chronic medical conditions.   Faythe Casa, NP 03/12/2021 8:13 PM   03/08/2021, 10:53 AM

## 2021-03-09 ENCOUNTER — Telehealth: Payer: Self-pay | Admitting: *Deleted

## 2021-03-09 NOTE — Telephone Encounter (Signed)
Erroneous entry

## 2021-03-11 ENCOUNTER — Other Ambulatory Visit: Payer: Self-pay | Admitting: Psychiatry

## 2021-03-11 DIAGNOSIS — F411 Generalized anxiety disorder: Secondary | ICD-10-CM

## 2021-03-11 DIAGNOSIS — F4312 Post-traumatic stress disorder, chronic: Secondary | ICD-10-CM

## 2021-03-12 ENCOUNTER — Other Ambulatory Visit: Payer: Self-pay

## 2021-03-12 ENCOUNTER — Telehealth: Payer: Self-pay | Admitting: *Deleted

## 2021-03-12 ENCOUNTER — Inpatient Hospital Stay (HOSPITAL_BASED_OUTPATIENT_CLINIC_OR_DEPARTMENT_OTHER): Payer: Medicare Other | Admitting: Oncology

## 2021-03-12 ENCOUNTER — Inpatient Hospital Stay: Payer: Medicare Other

## 2021-03-12 ENCOUNTER — Ambulatory Visit
Admission: RE | Admit: 2021-03-12 | Discharge: 2021-03-12 | Disposition: A | Discharge status: Home / Self Care | Payer: No Typology Code available for payment source | Source: Ambulatory Visit | Attending: Oncology | Admitting: Oncology

## 2021-03-12 ENCOUNTER — Ambulatory Visit: Payer: No Typology Code available for payment source

## 2021-03-12 ENCOUNTER — Other Ambulatory Visit: Payer: No Typology Code available for payment source

## 2021-03-12 ENCOUNTER — Encounter: Payer: Self-pay | Admitting: Oncology

## 2021-03-12 VITALS — BP 132/72 | HR 60 | Temp 97.2°F | Resp 18 | Wt 184.5 lb

## 2021-03-12 DIAGNOSIS — I82411 Acute embolism and thrombosis of right femoral vein: Secondary | ICD-10-CM | POA: Diagnosis not present

## 2021-03-12 DIAGNOSIS — C01 Malignant neoplasm of base of tongue: Secondary | ICD-10-CM | POA: Diagnosis not present

## 2021-03-12 DIAGNOSIS — C159 Malignant neoplasm of esophagus, unspecified: Secondary | ICD-10-CM

## 2021-03-12 LAB — CBC WITH DIFFERENTIAL/PLATELET
Abs Immature Granulocytes: 0.02 10*3/uL (ref 0.00–0.07)
Basophils Absolute: 0 10*3/uL (ref 0.0–0.1)
Basophils Relative: 1 %
Eosinophils Absolute: 0 10*3/uL (ref 0.0–0.5)
Eosinophils Relative: 1 %
HCT: 33.9 % — ABNORMAL LOW (ref 39.0–52.0)
Hemoglobin: 11.7 g/dL — ABNORMAL LOW (ref 13.0–17.0)
Immature Granulocytes: 0 %
Lymphocytes Relative: 7 %
Lymphs Abs: 0.4 10*3/uL — ABNORMAL LOW (ref 0.7–4.0)
MCH: 32.8 pg (ref 26.0–34.0)
MCHC: 34.5 g/dL (ref 30.0–36.0)
MCV: 95 fL (ref 80.0–100.0)
Monocytes Absolute: 0.4 10*3/uL (ref 0.1–1.0)
Monocytes Relative: 7 %
Neutro Abs: 5 10*3/uL (ref 1.7–7.7)
Neutrophils Relative %: 84 %
Platelets: 182 10*3/uL (ref 150–400)
RBC: 3.57 MIL/uL — ABNORMAL LOW (ref 4.22–5.81)
RDW: 12.4 % (ref 11.5–15.5)
WBC: 5.9 10*3/uL (ref 4.0–10.5)
nRBC: 0 % (ref 0.0–0.2)

## 2021-03-12 LAB — BASIC METABOLIC PANEL
Anion gap: 6 (ref 5–15)
BUN: 13 mg/dL (ref 8–23)
CO2: 29 mmol/L (ref 22–32)
Calcium: 9.7 mg/dL (ref 8.9–10.3)
Chloride: 100 mmol/L (ref 98–111)
Creatinine, Ser: 1.01 mg/dL (ref 0.61–1.24)
GFR, Estimated: >60 mL/min (ref >60)
Glucose, Bld: 126 mg/dL — ABNORMAL HIGH (ref 70–99)
Potassium: 3.6 mmol/L (ref 3.5–5.1)
Sodium: 135 mmol/L (ref 135–145)

## 2021-03-12 LAB — MAGNESIUM: Magnesium: 1.6 mg/dL — ABNORMAL LOW (ref 1.7–2.4)

## 2021-03-12 MED ORDER — HEPARIN SOD (PORK) LOCK FLUSH 100 UNIT/ML IV SOLN
500.0000 [IU] | Freq: Once | INTRAVENOUS | Status: AC
  Administered 2021-03-12: 500 [IU] via INTRAVENOUS
  Filled 2021-03-12: qty 5

## 2021-03-12 NOTE — Progress Notes (Signed)
Patient here for oncology follow-up appointment, expresses complaints of right leg swelling

## 2021-03-12 NOTE — Telephone Encounter (Signed)
Wife called asking for results  IMPRESSION: 1. Interval resolution of right deep femoral DVT. No evidence of interval acute or chronic DVT.   Electronically Signed   By: Lucrezia Europe M.D.   On: 03/12/2021 12:27

## 2021-03-12 NOTE — Telephone Encounter (Signed)
Called/informed wife ultrasound is - per Sonia Baller, NP, educated wife patient should elevate extremity and apply ice as needed, f/u with apts as scheduled. Wife verbalizes understanding, no questions at this time.

## 2021-03-13 ENCOUNTER — Inpatient Hospital Stay: Payer: No Typology Code available for payment source

## 2021-03-13 DIAGNOSIS — Z86718 Personal history of other venous thrombosis and embolism: Secondary | ICD-10-CM | POA: Insufficient documentation

## 2021-03-14 ENCOUNTER — Other Ambulatory Visit: Payer: Self-pay

## 2021-03-14 ENCOUNTER — Telehealth: Payer: Self-pay | Admitting: *Deleted

## 2021-03-14 ENCOUNTER — Emergency Department
Admission: EM | Admit: 2021-03-14 | Discharge: 2021-03-14 | Disposition: A | Discharge status: Home / Self Care | Payer: No Typology Code available for payment source | Attending: Emergency Medicine | Admitting: Emergency Medicine

## 2021-03-14 ENCOUNTER — Emergency Department: Payer: No Typology Code available for payment source

## 2021-03-14 DIAGNOSIS — I10 Essential (primary) hypertension: Secondary | ICD-10-CM | POA: Diagnosis not present

## 2021-03-14 DIAGNOSIS — Z7984 Long term (current) use of oral hypoglycemic drugs: Secondary | ICD-10-CM | POA: Insufficient documentation

## 2021-03-14 DIAGNOSIS — Z8501 Personal history of malignant neoplasm of esophagus: Secondary | ICD-10-CM | POA: Diagnosis not present

## 2021-03-14 DIAGNOSIS — L03115 Cellulitis of right lower limb: Secondary | ICD-10-CM | POA: Diagnosis not present

## 2021-03-14 DIAGNOSIS — Z87891 Personal history of nicotine dependence: Secondary | ICD-10-CM | POA: Insufficient documentation

## 2021-03-14 DIAGNOSIS — Z7901 Long term (current) use of anticoagulants: Secondary | ICD-10-CM | POA: Insufficient documentation

## 2021-03-14 DIAGNOSIS — E119 Type 2 diabetes mellitus without complications: Secondary | ICD-10-CM | POA: Insufficient documentation

## 2021-03-14 DIAGNOSIS — R2241 Localized swelling, mass and lump, right lower limb: Secondary | ICD-10-CM | POA: Diagnosis present

## 2021-03-14 LAB — COMPREHENSIVE METABOLIC PANEL
ALT: 11 U/L (ref 0–44)
AST: 17 U/L (ref 15–41)
Albumin: 4 g/dL (ref 3.5–5.0)
Alkaline Phosphatase: 38 U/L (ref 38–126)
Anion gap: 8 (ref 5–15)
BUN: 12 mg/dL (ref 8–23)
CO2: 27 mmol/L (ref 22–32)
Calcium: 9.3 mg/dL (ref 8.9–10.3)
Chloride: 102 mmol/L (ref 98–111)
Creatinine, Ser: 0.76 mg/dL (ref 0.61–1.24)
GFR, Estimated: >60 mL/min (ref >60)
Glucose, Bld: 117 mg/dL — ABNORMAL HIGH (ref 70–99)
Potassium: 3.6 mmol/L (ref 3.5–5.1)
Sodium: 137 mmol/L (ref 135–145)
Total Bilirubin: 0.8 mg/dL (ref 0.3–1.2)
Total Protein: 6.4 g/dL — ABNORMAL LOW (ref 6.5–8.1)

## 2021-03-14 LAB — CBC WITH DIFFERENTIAL/PLATELET
Abs Immature Granulocytes: 0.02 10*3/uL (ref 0.00–0.07)
Basophils Absolute: 0 10*3/uL (ref 0.0–0.1)
Basophils Relative: 0 %
Eosinophils Absolute: 0 10*3/uL (ref 0.0–0.5)
Eosinophils Relative: 1 %
HCT: 32.8 % — ABNORMAL LOW (ref 39.0–52.0)
Hemoglobin: 11.1 g/dL — ABNORMAL LOW (ref 13.0–17.0)
Immature Granulocytes: 0 %
Lymphocytes Relative: 12 %
Lymphs Abs: 0.7 10*3/uL (ref 0.7–4.0)
MCH: 32.1 pg (ref 26.0–34.0)
MCHC: 33.8 g/dL (ref 30.0–36.0)
MCV: 94.8 fL (ref 80.0–100.0)
Monocytes Absolute: 0.4 10*3/uL (ref 0.1–1.0)
Monocytes Relative: 8 %
Neutro Abs: 4.5 10*3/uL (ref 1.7–7.7)
Neutrophils Relative %: 79 %
Platelets: 179 10*3/uL (ref 150–400)
RBC: 3.46 MIL/uL — ABNORMAL LOW (ref 4.22–5.81)
RDW: 12.2 % (ref 11.5–15.5)
WBC: 5.7 10*3/uL (ref 4.0–10.5)
nRBC: 0 % (ref 0.0–0.2)

## 2021-03-14 LAB — URIC ACID: Uric Acid, Serum: 4.8 mg/dL (ref 3.7–8.6)

## 2021-03-14 MED ORDER — CEPHALEXIN 500 MG PO CAPS: 500.0000 mg | ORAL_CAPSULE | Freq: Three times a day (TID) | ORAL | 0 refills | Status: DC

## 2021-03-14 NOTE — Telephone Encounter (Signed)
Spoke with [name] advised if she could send a picture via MyChart in order to access red spot on pts ankle. Ms. Sean Garner said she can not use MyChart so I provided my work e-mail in order to receive picture. Says its very painful, if able to be treated medication will be provided. If not, a referral to vascular will be done.

## 2021-03-14 NOTE — Discharge Instructions (Addendum)
Elevate the right ankle to decrease amount of swelling.  Take the antibiotic as prescribed.  If the redness and swelling are worsening please return emergency department.  Due to your ultrasound for DVT being -2 days ago, if the redness and swelling move up the leg and you are becoming worse she will need a repeat ultrasound. Return if worsening

## 2021-03-14 NOTE — Telephone Encounter (Signed)
Call returned to [facility], informed of NP response. She stated that it is hard to contain patient and that when he does keep leg elevated, the swelling does improve. She reports that a 5 mm red area has appeared at his ankle bone this morning. It does not streak, but it is very painful. She also did not know that he was to wear TED hose, but will go buy some support stockings for him. She is in agreement to see vascular when decided to refer.

## 2021-03-14 NOTE — ED Provider Notes (Signed)
[facility] Emergency Department Provider Note  ____________________________________________   Event Date/Time   First MD Initiated Contact with Patient 03/14/21 1614     (approximate)  I have reviewed the triage vital signs and the nursing notes.   HISTORY  Chief Complaint Leg Swelling    HPI Sean Angstadt. is a 75 y.o. male presents emergency department complaining of right ankle swelling for approximately 2 weeks.  Was seen at [facility] 2 days ago and had a ultrasound of the right lower extremity which was negative for a DVT.  Patient states that he is on Eliquis.  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  Patient states the right ankle is very painful.  He is unsure if he has a infection.  Also has a family history of gout but he himself has never had gout before.    Past Medical History:  Diagnosis Date  . Anxiety   . 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 Cataract    lt eye repair; present in rt eye but not ripe yet.  . Claustrophobia   . Diabetes mellitus without complication ([place])   . GERD (gastroesophageal reflux disease)   . Glaucoma    using gtts for this.  . Hypercholesterolemia   . Hypertension     Patient Active Problem List   Diagnosis Date Noted  . Foot drop, left 01/26/2021  . Acute deep vein thrombosis (DVT) of femoral vein of right lower extremity ([place]) 01/25/2021  . Pulmonary embolism, bilateral ([place]) 01/19/2021  . SOB (shortness of breath) 01/19/2021  . Elevated troponin 01/19/2021  . Pancytopenia ([place]) 01/19/2021  . Goals of care, counseling/discussion 01/18/2021  . B12 deficiency 01/10/2021  . Bradycardia 01/10/2021  . Dehydration 01/08/2021  . Glaucoma 01/01/2021  . Exposure to Agent [facility] 01/01/2021  . Diabetes mellitus ([place]) 01/01/2021  . High risk medication use 01/01/2021  . GAD (generalized anxiety disorder) 01/01/2021  . Mucositis due to radiation  therapy 12/20/2020  . Hypocalcemia 12/15/2020  . Hypomagnesemia 12/13/2020  . Hyponatremia 11/28/2020  . Anxiety 11/27/2020  . Encounter for antineoplastic chemotherapy 11/19/2020  . 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 Chronic post-traumatic stress disorder (PTSD) 10/24/2020  . Carcinoma of base of tongue ([place]) 10/17/2020  . Benign essential hypertension 08/08/2014  . GERD (gastroesophageal reflux disease) 08/08/2014  . Other and unspecified hyperlipidemia 08/08/2014  . Type 2 diabetes mellitus without complications ([place]) [id number]  . Knee pain 09/09/2013  . Osteoarthritis of right knee 09/09/2013    Past Surgical History:  Procedure Laterality Date  . CATARACT EXTRACTION W/PHACO Left 12/03/2017   Procedure: CATARACT EXTRACTION PHACO AND INTRAOCULAR LENS PLACEMENT ([place]) COMPLICATED DIABETIC LEFT;  Surgeon: Leandrew Koyanagi, MD;  Location: [facility];  Service: Ophthalmology;  Laterality: Left;  Diabetic - oral meds  . CHOLECYSTECTOMY    . COLONOSCOPY  2016  . PORTA CATH INSERTION N/A 11/13/2020   Procedure: PORTA CATH INSERTION;  Surgeon: Algernon Huxley, MD;  Location: [place] CV LAB;  Service: Cardiovascular;  Laterality: N/A;    Prior to Admission medications   Medication Sig Start Date End Date Taking? Authorizing Provider  cephALEXin (KEFLEX) 500 MG capsule Take 1 capsule (500 mg total) by mouth 3 (three) times daily. 03/14/21  Yes Kanai Hilger, Linden Dolin, PA-C  ALPRAZolam Duanne Moron) 0.25 MG tablet Take 1 tablet (0.25 mg total) by mouth at bedtime as needed for anxiety. Patient not  taking: Reported on 03/12/2021 01/12/21   Borders, Kirt Boys, NP  APIXABAN Arne [place]) VTE STARTER PACK (10MG  AND 5MG ) Take as directed on package: start with two-5mg  tablets twice daily for 7 days. On day 8 (01/29/2021), switch to one-5mg  tablet twice daily. 01/22/21   Nolberto Hanlon, MD  brimonidine-timolol (COMBIGAN) 0.2-0.5 % ophthalmic solution Place 1 drop into the left eye daily. 06/09/19    [provider]  calcium carbonate (OSCAL) 1500 (600 Ca) MG TABS tablet Take by mouth 2 (two) times daily with a meal.    [provider]  citalopram (CELEXA) 20 MG tablet Take 1 tablet (20 mg total) by mouth daily. 03/12/21   Ursula Alert, MD  dexamethasone (DECADRON) 4 MG tablet 2 TABS BY MOUTH DAILY. TAKE DAILY X 3 DAYS STARTING THE DAY AFTER CHEMOTHERAPY. TAKE WITH FOOD. Patient not taking: Reported on 03/12/2021 01/01/21   Lequita Asal, MD  esomeprazole (NEXIUM) 20 MG capsule Take 40 mg by mouth daily at 12 noon.    [provider]  fluticasone (FLONASE) 50 MCG/ACT nasal spray Place into both nostrils. 12/11/20   [provider]  glipiZIDE (GLUCOTROL XL) 5 MG 24 hr tablet Take 5 mg by mouth 2 (two) times daily. 05/26/18   [provider]  glucose blood test strip  05/06/17   [provider]  lidocaine-prilocaine (EMLA) cream Apply to affected area once 10/24/20   Lequita Asal, MD  magic mouthwash w/lidocaine SOLN Take 5 mLs by mouth.    [provider]  Magnesium Oxide (MAG-OXIDE) 200 MG TABS Take 400 mg by mouth daily.    [provider]  metFORMIN (GLUCOPHAGE-XR) 500 MG 24 hr tablet Take 500 mg by mouth 2 (two) times daily. Takes 2 tablets and 1 tablet in the pm    [provider]  simvastatin (ZOCOR) 40 MG tablet Take 40 mg by mouth daily at 6 PM.    [provider]  sucralfate (CARAFATE) 1 g tablet Take 0.5 tablets (0.5 g total) by mouth 3 (three) times daily. Dissolve in warm water, swish and swallow Patient not taking: Reported on 03/12/2021 12/11/20   Noreene Filbert, MD  traZODone (DESYREL) 50 MG tablet Take 0.5-1 tablets (25-50 mg total) by mouth at bedtime as needed for sleep. 01/12/21   Borders, Kirt Boys, NP  vitamin B-12 (CYANOCOBALAMIN) 1000 MCG tablet Take 1,000 mcg by mouth daily.    [provider]  VITAMIN D PO Take 800 Units by mouth daily.    [provider]     Allergies Patient has no known allergies.  Family History  Problem Relation Age of Onset  . Cancer Mother   . Colon cancer Neg Hx   . Colon polyps Neg Hx   . Esophageal cancer Neg Hx   . Rectal cancer Neg Hx   . Stomach cancer Neg Hx     Social History Social History   Tobacco Use  . Smoking status: Former Smoker    Quit date: 1986    Years since quitting: 36.3  . Smokeless tobacco: Never Used  Vaping Use  . Vaping Use: Never used  Substance Use Topics  . Alcohol use: Not Currently    Comment: occasional - 1 glass wine/month  . Drug use: Never    Review of Systems  Constitutional: No fever/chills Eyes: No visual changes. ENT: No sore throat. Respiratory: Denies cough Cardiovascular: Denies chest pain Gastrointestinal: Denies abdominal pain Genitourinary: Negative for dysuria. Musculoskeletal: Negative for back pain.  Positive right ankle pain Skin: Negative for rash. Psychiatric: no mood changes,     ____________________________________________   PHYSICAL EXAM:  VITAL SIGNS: ED Triage Vitals  Enc Vitals Group     BP 03/14/21 1616 (!) 175/82     Pulse Rate 03/14/21 1616 63     Resp 03/14/21 1616 18     Temp 03/14/21 1616 98.6 F (37 C)     Temp Source 03/14/21 1616 Oral     SpO2 03/14/21 1616 98 %     Weight 03/14/21 1617 185 lb 3 oz (84 kg)     Height 03/14/21 1617 5\' 10"  (1.778 m)     Head Circumference --      Peak Flow --      Pain Score 03/14/21 1617 0     Pain Loc --      Pain Edu? --      Excl. in [place]? --     Constitutional: Alert and oriented. Well appearing and in no acute distress. Eyes: Conjunctivae are normal.  Head: Atraumatic. Nose: No congestion/rhinnorhea. Mouth/Throat: Mucous membranes are moist.   Neck:  supple no lymphadenopathy noted Cardiovascular: Normal rate, regular rhythm. Heart sounds are normal Respiratory: Normal respiratory effort.  No retractions, lungs c t a  GU: deferred Musculoskeletal: FROM all  extremities, warm and well perfused, right ankle is tender red and swollen on the lateral malleolus, area is warm to touch, calf is not tender, neurovascular is intact Neurologic:  Normal speech and language.  Skin:  Skin is warm, dry and intact. No rash noted. Psychiatric: Mood and affect are normal. Speech and behavior are normal.  ____________________________________________   LABS (all labs ordered are listed, but only abnormal results are displayed)  Labs Reviewed  CBC WITH DIFFERENTIAL/PLATELET - Abnormal; Notable for the following components:      Result Value   RBC 3.46 (*)    Hemoglobin 11.1 (*)    HCT 32.8 (*)    All other components within normal limits  COMPREHENSIVE METABOLIC PANEL - Abnormal; Notable for the following components:   Glucose, Bld 117 (*)    Total Protein 6.4 (*)    All other components within normal limits  URIC ACID   ____________________________________________   ____________________________________________  RADIOLOGY  X-ray of the right ankle  ____________________________________________   PROCEDURES  Procedure(s) performed: No  Procedures    ____________________________________________   INITIAL IMPRESSION / ASSESSMENT AND PLAN / ED COURSE  Pertinent labs & imaging results that were available during my care of the patient were reviewed by me and considered in my medical decision making (see chart for details).   Patient is a 75 year old male presents right ankle pain.  See HPI.  Physical exam shows patient appears stable.  DDx: Cellulitis, gout, DVT, osteomyelitis  Do not feel that this is a DVT as patient just had a right lower extremity ultrasound for DVT that was negative.  CBC, metabolic panel and uric acid X-ray of the right ankle   X-ray of the right ankle reviewed by me and confirmed by radiology to be negative  Labs are reassuring, CBC, metabolic panel and uric acid are negative  I did explain findings to the  patient.  Due to him having a negative ultrasound 2 days ago for DVT I instructed him to have a repeat ultrasound in 1 week if he continues to have pain and swelling.  Do feel this is more of a cellulitis in his is warm to touch and is on  the lateral malleolus and not in his calf.  He was given a prescription for Keflex.  He is to follow-up with his regular doctor in 3 days if not improving.  Return if worsening.  Discharged in stable condition.  Sean Garner. was evaluated in Emergency Department on 03/14/2021 for the symptoms described in the history of present illness. He was evaluated in the context of the global COVID-19 pandemic, which necessitated consideration that the patient might be at risk for infection with the SARS-CoV-2 virus that causes COVID-19. Institutional protocols and algorithms that pertain to the evaluation of patients at risk for COVID-19 are in a state of rapid change based on information released by regulatory bodies including the CDC and federal and state organizations. These policies and algorithms were followed during the patient's care in the ED.    As part of my medical decision making, I reviewed the following data within the [place] notes reviewed and incorporated, Labs reviewed , Old chart reviewed, Radiograph reviewed , Notes from prior ED visits and [place] Controlled Substance Database  ____________________________________________   FINAL CLINICAL IMPRESSION(S) / ED DIAGNOSES  Final diagnoses:  Cellulitis of right ankle      NEW MEDICATIONS STARTED DURING THIS VISIT:  New Prescriptions   CEPHALEXIN (KEFLEX) 500 MG CAPSULE    Take 1 capsule (500 mg total) by mouth 3 (three) times daily.     Note:  This document was prepared using Dragon voice recognition software and may include unintentional dictation errors.    Versie Starks, PA-C 03/14/21 1736    Lucrezia Starch, MD 03/15/21 [phone]

## 2021-03-14 NOTE — Telephone Encounter (Signed)
Wife called asking who they need to see about patient leg which is swollen and painful and the [place] is negative. Please advise

## 2021-03-14 NOTE — ED Triage Notes (Signed)
Pt to ER from [facility].   Pt with R ankle swelling x2 weeks. Pt had a CT scan on Monday at [facility] and was told it was negative. Pt had bilateral pulmonary emboli and DVT in R foot approx 1 month/6 weeks ago. Pt denies pain. Reports he takes his Eliquis as prescribed. Pt reports finishing radiation and chemo 2 months ago for squamous cell carcinoma.

## 2021-04-06 ENCOUNTER — Encounter: Payer: Self-pay | Admitting: *Deleted

## 2021-04-10 ENCOUNTER — Ambulatory Visit: Payer: Medicare Other | Admitting: Psychiatry

## 2021-04-11 ENCOUNTER — Telehealth: Payer: Self-pay | Admitting: Oncology

## 2021-04-11 NOTE — Telephone Encounter (Signed)
Thank you! He is seeing Dr. Tasia Catchings tomorrow.   Sonia Baller

## 2021-04-11 NOTE — Telephone Encounter (Signed)
Patient's wife called and wanted an update regarding treatment plan for the patient. She stated that Dr. Elenor Quinones (surgeon) has attempted to contact the office. She has requested a call back from [facility]. She is concerned that patient "doesn't have an oncologist".

## 2021-04-11 NOTE — Telephone Encounter (Signed)
Great! Thanks  [employer]

## 2021-04-12 ENCOUNTER — Encounter: Payer: Self-pay | Admitting: Oncology

## 2021-04-12 ENCOUNTER — Inpatient Hospital Stay: Payer: Medicare Other | Attending: Oncology | Admitting: Oncology

## 2021-04-12 ENCOUNTER — Other Ambulatory Visit: Payer: Self-pay

## 2021-04-12 VITALS — BP 138/65 | HR 60 | Temp 98.1°F | Resp 18 | Wt 177.2 lb

## 2021-04-12 DIAGNOSIS — C01 Malignant neoplasm of base of tongue: Secondary | ICD-10-CM | POA: Diagnosis present

## 2021-04-12 DIAGNOSIS — C775 Secondary and unspecified malignant neoplasm of intrapelvic lymph nodes: Secondary | ICD-10-CM | POA: Diagnosis not present

## 2021-04-12 DIAGNOSIS — Z87891 Personal history of nicotine dependence: Secondary | ICD-10-CM | POA: Insufficient documentation

## 2021-04-12 DIAGNOSIS — C159 Malignant neoplasm of esophagus, unspecified: Secondary | ICD-10-CM

## 2021-04-12 DIAGNOSIS — I2699 Other pulmonary embolism without acute cor pulmonale: Secondary | ICD-10-CM | POA: Diagnosis not present

## 2021-04-12 DIAGNOSIS — I82411 Acute embolism and thrombosis of right femoral vein: Secondary | ICD-10-CM

## 2021-04-12 DIAGNOSIS — C160 Malignant neoplasm of cardia: Secondary | ICD-10-CM | POA: Insufficient documentation

## 2021-04-12 DIAGNOSIS — Z7901 Long term (current) use of anticoagulants: Secondary | ICD-10-CM | POA: Diagnosis not present

## 2021-04-12 DIAGNOSIS — M21372 Foot drop, left foot: Secondary | ICD-10-CM

## 2021-04-12 NOTE — Progress Notes (Signed)
[facility]  [address] [place], [place] [address] Phone: [phone]  Fax: [phone]   Clinic Day:  04/12/2021  Referring physician: Derinda Late, MD  Chief Complaint: Sean Garner. is a 75 y.o. 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  PERTINENT ONCOLOGY HISTORY Patient previously followed up by Dr.Corcoran, patient switched care to me on 04/13/21 Extensive medical record review was performed by me  Clinical stage I (T1N1Mx) right base of tongue carcinoma s/p ultrasound guided right cervical lymph node biopsy on 10/11/2020.  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    09/28/2020 Neck soft tissue CT on  revealed a 3.5 x 3.0 x 4.8 cm right neck mass most c/w enlarged lymph node. There was a 14 mm enhancing mass in the right base of tongue.   PET scan on 10/25/2020 revealed right tongue base primary with ipsilateral level 2 nodal metastasis. There were no findings of extracervical metastatic disease. There was focal hypermetabolism in the distal esophagus, with possible concurrent soft tissue density lesion. Recommended correlation with endoscopy. There was vague right lower lobe low-level hypermetabolism and ground-glass nodularity, favored minimal infection or aspiration. Incidental findings included aortic atherosclerosis, coronary artery atherosclerosis, emphysema, a tiny hiatal hernia, and prostatomegaly. 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  Speech discrimination scores were 100% in each ear.  11/03/2020 Upper endoscopy on  revealed a malignant esophageal tumor at the gastroesophageal junction. There were esophageal mucosal changes c/w short-segment Barrett's esophagus. There was a 2 cm hiatal hernia. There was gastritis.  Pathology in the  esophagus at 36 cm revealed intramucosal adenocarcinoma arising in a background of high grade dysplasia and intestinal metaplasia; pathology at 38 cm revealed adenocarcinoma.  11/15/2020 EUS on  revealed early stage adenocarcinoma arising from Barrett's esophagus.  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   11/20/2020 - 12/27/2020  6 weeks of cisplatin with concurrent radiation  completed radiation on 01/11/2021.  01/19/2021 Chest abdomen and pelvis CT with contrast on  revealed extensive nearly occlusive pulmonary thromboembolic disease bilaterally with CT evidence of right heart strain c/w at least submassive (intermediate risk) PE. There was no evidence of metastatic disease within the chest, abdomen or pelvis.  Improvement in previously demonstrated distal esophageal wall thickening without apparent focal mass lesion. There was aortic atherosclerosis and emphysema. 01/20/2021 Bilateral lower extremity duplex on  revealed occlusive DVT in the right deep femoral vein. Started on anticoagulation with heparin drip and discharged off Eliquis. Prothrombin gene mutation and factor 5 leiden were both negative  He has PTSD and clastrophobia.  Anxiety was relieved by alprazolam prior to radiation.  He is on citalopram 30 mg a day and trazodone at night.  He was admitted to [facility] from 01/19/2021 - 01/22/2021 with bilateral pulmonary emboli. Bilateral lower extremity venous duplex was positive for occlusive DVT in the right deep femoral vein. He was placed on  and discharged on Eliquis.   INTERVAL HISTORY Sean Garner. is a 75 y.o. 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  Patient canceled her PET scan that was ordered for assessment of treatment response for head and neck cancer.  04/09/2021, patient was  seen by Dr. Elenor Quinones and underwent EGD. Polypoid tumor was seen at the GE junction, consistent with the location of the previously visualized tumor.  With no significant difference compared to photos from other endoscopies.  The proximal esophagus is involved.  Esophagectomy will be applicable. Biopsy was taken at 38 cm.  Dr. Elenor Quinones has reached out radiation oncology Dr. Baruch Gouty and recommend neoadjuvant chemo and radiation. Patient presents to discuss above plan.  This is my first time meeting [name] and his wife. Patient and wife have questions about why EGD was done without resection and wife felt that procedure was redundant.  No Complaints today.  Past Medical History:  Diagnosis Date  . Anxiety   . Cancer [facility])    Head/throat Cancer at the base of the tongue  . Cataract    lt eye repair; present in rt eye but not ripe yet.  . Claustrophobia   . Diabetes mellitus without complication ([place])   . GERD (gastroesophageal reflux disease)   . Glaucoma    using gtts for this.  . Hypercholesterolemia   . Hypertension     Past Surgical History:  Procedure Laterality Date  . CATARACT EXTRACTION W/PHACO Left 12/03/2017   Procedure: CATARACT EXTRACTION PHACO AND INTRAOCULAR LENS PLACEMENT ([place]) COMPLICATED DIABETIC LEFT;  Surgeon: Leandrew Koyanagi, MD;  Location: [place];  Service: Ophthalmology;  Laterality: Left;  Diabetic - oral meds  . CHOLECYSTECTOMY    . COLONOSCOPY  2016  . PORTA CATH INSERTION N/A 11/13/2020   Procedure: PORTA CATH INSERTION;  Surgeon: Algernon Huxley, MD;  Location: [facility];  Service: Cardiovascular;  Laterality: N/A;    Family History  Problem Relation Age of Onset  . Cancer Mother   . Colon cancer Neg Hx   . Colon polyps Neg Hx   . Esophageal cancer Neg Hx   . Rectal cancer Neg Hx   . Stomach cancer Neg Hx     Social History:  reports that he quit smoking about 36 years ago. He has never used smokeless tobacco. He reports  previous alcohol use. He reports that he does not use drugs. He quit smoking cold [place] in 1986. He smoked 2 packs per day for 20 years. He drinks a glass of wine or a Margarita occasionally. 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 He was exposed to [employer]. He worked with a Geologist, engineering for 34 years. The patient is accompanied by his wife today.  Allergies: No Known Allergies  Current Medications: Current Outpatient Medications  Medication Sig Dispense Refill  . APIXABAN (ELIQUIS) VTE STARTER PACK (10MG  AND 5MG ) Take as directed on package: start with two-5mg  tablets twice daily for 7 days. On day 8 (01/29/2021), switch to one-5mg  tablet twice daily. 1 tablet 0  . brimonidine-timolol (COMBIGAN) 0.2-0.5 % ophthalmic solution Place 1 drop into the left eye daily.    . calcium carbonate (OSCAL) 1500 (600 Ca) MG TABS tablet Take by mouth 2 (two) times daily with a meal.    . citalopram (CELEXA) 20 MG tablet Take 1 tablet (20 mg total) by mouth daily. 30 tablet 1  . fluticasone (FLONASE) 50 MCG/ACT nasal spray Place into both nostrils.    Marland Kitchen glipiZIDE (GLUCOTROL XL) 5 MG 24 hr tablet Take 5 mg by mouth 2 (two) times daily.  1  . glucose blood test strip     . Magnesium Oxide (MAG-OXIDE) 200 MG TABS Take 400 mg by  mouth daily.    . metFORMIN (GLUCOPHAGE-XR) 500 MG 24 hr tablet Take 500 mg by mouth 2 (two) times daily. Takes 2 tablets and 1 tablet in the pm    . pantoprazole (PROTONIX) 40 MG tablet Take by mouth.    . simvastatin (ZOCOR) 40 MG tablet Take 40 mg by mouth daily at 6 PM.    . traZODone (DESYREL) 50 MG tablet Take 0.5-1 tablets (25-50 mg total) by mouth at bedtime as needed for sleep. 30 tablet 2  . vitamin B-12 (CYANOCOBALAMIN) 1000 MCG tablet Take 1,000 mcg by mouth daily.    Marland Kitchen VITAMIN D PO Take 800 Units by mouth daily.    Marland Kitchen ALPRAZolam (XANAX) 0.25 MG tablet Take 1 tablet (0.25 mg total) by mouth at bedtime as needed for anxiety. (Patient not taking: No sig  reported) 60 tablet 0  . calcium carbonate (OS-CAL) 600 MG TABS tablet Take by mouth. (Patient not taking: Reported on 04/12/2021)    . cephALEXin (KEFLEX) 500 MG capsule Take 1 capsule (500 mg total) by mouth 3 (three) times daily. (Patient not taking: Reported on 04/12/2021) 21 capsule 0  . dexamethasone (DECADRON) 4 MG tablet 2 TABS BY MOUTH DAILY. TAKE DAILY X 3 DAYS STARTING THE DAY AFTER CHEMOTHERAPY. TAKE WITH FOOD. (Patient not taking: No sig reported) 30 tablet 1  . esomeprazole (NEXIUM) 20 MG capsule Take 40 mg by mouth daily at 12 noon. (Patient not taking: Reported on 04/12/2021)    . lidocaine-prilocaine (EMLA) cream Apply to affected area once (Patient not taking: Reported on 04/12/2021) 30 g 3  . magic mouthwash w/lidocaine SOLN Take 5 mLs by mouth. (Patient not taking: Reported on 04/12/2021)    . sucralfate (CARAFATE) 1 g tablet Take 0.5 tablets (0.5 g total) by mouth 3 (three) times daily. Dissolve in warm water, swish and swallow (Patient not taking: No sig reported) 60 tablet 1   Current Facility-Administered Medications  Medication Dose Route Frequency Provider Last Rate Last Admin  . 0.9 %  sodium chloride infusion  500 mL Intravenous Once Pyrtle, Lajuan Lines, MD       Facility-Administered Medications Ordered in Other Visits  Medication Dose Route Frequency Provider Last Rate Last Admin  . 0.9 % NaCl with KCl 20 mEq/ L  infusion   Intravenous Once Corcoran, Melissa C, MD      . CISplatin (PLATINOL) 87 mg in sodium chloride 0.9 % 250 mL chemo infusion  40 mg/m2 (Order-Specific) Intravenous Once Nolon Stalls C, MD      . dexamethasone (DECADRON) 10 mg in sodium chloride 0.9 % 50 mL IVPB  10 mg Intravenous Once Corcoran, Melissa C, MD      . fosaprepitant (EMEND) 150 mg in sodium chloride 0.9 % 145 mL IVPB  150 mg Intravenous Once Corcoran, Melissa C, MD      . magnesium sulfate IVPB 2 g 50 mL  2 g Intravenous Once Lequita Asal, MD        Review of Systems  Constitutional:  Negative.  Negative for chills, fever, malaise/fatigue and weight loss.  HENT: Negative for congestion, ear pain and tinnitus.   Eyes: Negative.  Negative for blurred vision and double vision.  Respiratory: Negative.  Negative for cough, sputum production and shortness of breath.   Cardiovascular: Positive for leg swelling (Right leg ). Negative for chest pain and palpitations.  Gastrointestinal: Negative.  Negative for abdominal pain, constipation, diarrhea, nausea and vomiting.  Genitourinary: Negative for dysuria, frequency and urgency.  Musculoskeletal: Negative for back pain and falls.  Skin: Negative.  Negative for rash.  Neurological: Negative.  Negative for weakness and headaches.  Endo/Heme/Allergies: Negative.  Does not bruise/bleed easily.  Psychiatric/Behavioral: Negative.  Negative for depression. The patient is not nervous/anxious and does not have insomnia.    Performance status (ECOG): 1  Vitals Blood pressure 138/65, pulse 60, temperature 98.1 F (36.7 C), resp. rate 18, weight 177 lb 4 oz (80.4 kg), SpO2 97 %.   Physical Exam Vitals and nursing note reviewed.  Constitutional:      General: He is not in acute distress.    Appearance: He is not diaphoretic.  HENT:     Head: Normocephalic and atraumatic.     Comments: Gray hair.    Mouth/Throat:     Mouth: Mucous membranes are moist.     Pharynx: Oropharynx is clear.  Eyes:     General: No scleral icterus.    Extraocular Movements: Extraocular movements intact.     Conjunctiva/sclera: Conjunctivae normal.     Pupils: Pupils are equal, round, and reactive to light.     Comments: Blue eyes.  Cardiovascular:     Rate and Rhythm: Normal rate and regular rhythm.     Heart sounds: Normal heart sounds. No murmur heard.   Pulmonary:     Effort: Pulmonary effort is normal. No respiratory distress.     Breath sounds: Normal breath sounds. No wheezing or rales.  Chest:     Chest wall: No tenderness.  Breasts:      Right: No axillary adenopathy or supraclavicular adenopathy.     Left: No axillary adenopathy or supraclavicular adenopathy.    Abdominal:     General: Bowel sounds are normal. There is no distension.     Palpations: Abdomen is soft. There is no mass.     Tenderness: There is no abdominal tenderness. There is no guarding or rebound.  Musculoskeletal:        General: No swelling or tenderness. Normal range of motion.     Cervical back: Normal range of motion and neck supple.     Right lower leg: Swelling present.     Right ankle: Swelling present.  Lymphadenopathy:     Head:     Right side of head: No preauricular, posterior auricular or occipital adenopathy.     Left side of head: No preauricular, posterior auricular or occipital adenopathy.     Upper Body:     Right upper body: No supraclavicular or axillary adenopathy.     Left upper body: No supraclavicular or axillary adenopathy.     Lower Body: No right inguinal adenopathy. No left inguinal adenopathy.  Skin:    General: Skin is warm and dry.     Comments: Tan around neck s/p radiation. Scar on right side of neck, fingertip sized.  Neurological:     Mental Status: He is alert and oriented to person, place, and time.     Comments: Left drop foot, improved.  Psychiatric:        Behavior: Behavior normal.        Thought Content: Thought content normal.        Judgment: Judgment normal.    No visits with results within 3 Day(s) from this visit.  Latest known visit with results is:  Admission on 03/14/2021, Discharged on 03/14/2021  Component Date Value Ref Range Status  . WBC 03/14/2021 5.7  4.0 - 10.5 K/uL Final  . RBC 03/14/2021 3.46* 4.22 - 5.81  MIL/uL Final  . Hemoglobin 03/14/2021 11.1* 13.0 - 17.0 g/dL Final  . HCT 03/14/2021 32.8* 39.0 - 52.0 % Final  . MCV 03/14/2021 94.8  80.0 - 100.0 fL Final  . MCH 03/14/2021 32.1  26.0 - 34.0 pg Final  . MCHC 03/14/2021 33.8  30.0 - 36.0 g/dL Final  . RDW 03/14/2021 12.2  11.5  - 15.5 % Final  . Platelets 03/14/2021 179  150 - 400 K/uL Final  . nRBC 03/14/2021 0.0  0.0 - 0.2 % Final  . Neutrophils Relative % 03/14/2021 79  % Final  . Neutro Abs 03/14/2021 4.5  1.7 - 7.7 K/uL Final  . Lymphocytes Relative 03/14/2021 12  % Final  . Lymphs Abs 03/14/2021 0.7  0.7 - 4.0 K/uL Final  . Monocytes Relative 03/14/2021 8  % Final  . Monocytes Absolute 03/14/2021 0.4  0.1 - 1.0 K/uL Final  . Eosinophils Relative 03/14/2021 1  % Final  . Eosinophils Absolute 03/14/2021 0.0  0.0 - 0.5 K/uL Final  . Basophils Relative 03/14/2021 0  % Final  . Basophils Absolute 03/14/2021 0.0  0.0 - 0.1 K/uL Final  . Immature Granulocytes 03/14/2021 0  % Final  . Abs Immature Granulocytes 03/14/2021 0.02  0.00 - 0.07 K/uL Final   Performed at [facility], [address] [place], [place] [address]  . Sodium 03/14/2021 137  135 - 145 mmol/L Final  . Potassium 03/14/2021 3.6  3.5 - 5.1 mmol/L Final  . Chloride 03/14/2021 102  98 - 111 mmol/L Final  . CO2 03/14/2021 27  22 - 32 mmol/L Final  . Glucose, Bld 03/14/2021 117* 70 - 99 mg/dL Final   Glucose reference range applies only to samples taken after fasting for at least 8 hours.  . BUN 03/14/2021 12  8 - 23 mg/dL Final  . Creatinine, Ser 03/14/2021 0.76  0.61 - 1.24 mg/dL Final  . Calcium 03/14/2021 9.3  8.9 - 10.3 mg/dL Final  . Total Protein 03/14/2021 6.4* 6.5 - 8.1 g/dL Final  . Albumin 03/14/2021 4.0  3.5 - 5.0 g/dL Final  . AST 03/14/2021 17  15 - 41 U/L Final  . ALT 03/14/2021 11  0 - 44 U/L Final  . Alkaline Phosphatase 03/14/2021 38  38 - 126 U/L Final  . Total Bilirubin 03/14/2021 0.8  0.3 - 1.2 mg/dL Final  . GFR, Estimated 03/14/2021 >60  >60 mL/min Final   Comment: (NOTE) Calculated using the CKD-EPI Creatinine Equation (2021)   . Anion gap 03/14/2021 8  5 - 15 Final   Performed at [facility], [address] [place], [place] [address]  . Uric Acid, Serum 03/14/2021 4.8  3.7 - 8.6 mg/dL Final    Performed at [facility], [address] [place], [place] [address]    Assessment/Plan.  1. Carcinoma of base of tongue ([place])   2. Esophageal cancer, stage IB ([place])   3. Acute deep vein thrombosis (DVT) of femoral vein of right lower extremity ([facility])   4. Pulmonary embolism, bilateral ([place])     1. Clinical stage I right base of tongue carcinoma, Stage I, P16 positive.  He completed concurrent radiation and cisplatin on 01/11/2021. Patient declined PET scan and reluctant to reschedule.   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 EUS on 11/15/2020 revealed a T1bN0 lesion. 01/19/2021 Chest, abdomen and pelvis CT reveal improvement in the distal esophageal wall  thickening. Patient has been evaluated by Dr. Elenor Quinones and is status post EGD evaluation. I talked to Dr. Elenor Quinones today and he feels the lesion is not feasible for endoscopic resection. Dr. Elenor Quinones recommends neoadjuvant chemoradiation.  Dr. Cheron Schaumann updated. Plan concurrent carboplatin and Taxol weekly. We will schedule patient for chemotherapy class  3   Bilateral pulmonary emboli and RLE DVT Continue Eliquis 5 mg twice daily  I discussed the assessment and treatment plan with the patient.  The patient was provided an opportunity to ask questions and all were answered.  The patient agreed with the plan and demonstrated an understanding of the instructions.  The patient was advised to call back if the symptoms worsen or if the condition fails to improve as anticipated.  We spent sufficient time to discuss many aspect of care, questions were answered to patient's satisfaction. A total of 40 minutes was spent on this visit.  With 15 minutes spent reviewing image findings, pathology reports, 20 minutes counseling the patient on the diagnosis, goal of care, chemotherapy treatments, side effects of the treatment, management  of symptoms.  Additional 5 minutes was spent on answering patient's questions.  Earlie Server, MD, PhD Hematology Oncology [place] at [facility] 04/13/2021

## 2021-04-17 ENCOUNTER — Telehealth: Payer: Self-pay | Admitting: *Deleted

## 2021-04-17 ENCOUNTER — Telehealth: Payer: Self-pay

## 2021-04-17 NOTE — Telephone Encounter (Signed)
Called and spoke to Mrs. Eyerman with an update regarding esophageal cancer treatment plan. Dr. Tasia Catchings has spoken to Dr. Elenor Quinones and the best option is induction chemorads with  Dr. Tasia Catchings and Dr. Baruch Gouty. Once completed they will re-evaluate with PET and EGD biopsy. They are agreeable to this plan. She had several questions regarding scans and pre-admit testing that was arranged then cancelled at [facility]. These were likely scheduled prematurely since he will receive induction chemorads versus upfront surgery. There is a chest CT PE protocol and [place] lower extremity that was ordered and scheduled by [name]. She is asking if these are necessary. I have sent a message to [facility], [place], whom ordered these, to see if they are needed at this time. Awaiting response. Mrs. Tursi will scheduled with radiation once I call her back.

## 2021-04-17 NOTE — Telephone Encounter (Signed)
Wife called asking for a return call from Dr Tasia Catchings to let them know the plan for his cancer treatment. [phone]

## 2021-04-18 ENCOUNTER — Telehealth: Payer: Self-pay

## 2021-04-18 ENCOUNTER — Encounter: Payer: Self-pay | Admitting: *Deleted

## 2021-04-18 ENCOUNTER — Ambulatory Visit: Payer: No Typology Code available for payment source

## 2021-04-18 NOTE — Telephone Encounter (Signed)
Message received from [employer] [facility] at [facility] the the chest CT and lower extremity US were indeed in preparation for upfront surgery and these can be cancelled. I have notified Mrs. Quackenbush and she will need to call and cancel. I have provided phone number for CT department at [facility]. In arranging appointment to start chemorads they will be out of town until 05/07/21. We will arrange radiation appointment for simulation when he returns.

## 2021-04-21 ENCOUNTER — Encounter: Payer: Self-pay | Admitting: Oncology

## 2021-04-21 ENCOUNTER — Encounter: Payer: Self-pay | Admitting: Hematology and Oncology

## 2021-04-21 DIAGNOSIS — C159 Malignant neoplasm of esophagus, unspecified: Secondary | ICD-10-CM

## 2021-04-21 HISTORY — DX: Malignant neoplasm of esophagus, unspecified: C15.9

## 2021-04-21 NOTE — Progress Notes (Signed)
ON PATHWAY REGIMEN - Head and Neck  No Change  Continue With Treatment as Ordered.  Original Decision Date/Time: 10/24/2020 06:16     A cycle is every 7 days:     Cisplatin   **Always confirm dose/schedule in your pharmacy ordering system**  Patient Characteristics: Oropharynx, HPV Positive, Preoperative or Nonsurgical Candidate (Clinical Staging), cT0-4, cN1-3 or cT3-4, cN0 Disease Classification: Oropharynx HPV Status: Positive (+) Therapeutic Status: Preoperative or Nonsurgical Candidate (Clinical Staging) AJCC T Category: cT1 AJCC 8 Stage Grouping: I AJCC N Category: cN1 AJCC M Category: cM0 Intent of Therapy: Curative Intent, Discussed with Patient

## 2021-04-21 NOTE — Progress Notes (Signed)
START ON PATHWAY REGIMEN - Gastroesophageal     Administer weekly during RT:     Paclitaxel      Carboplatin   **Always confirm dose/schedule in your pharmacy ordering system**  Patient Characteristics: Esophageal & GE Junction, Adenocarcinoma, Preoperative or Nonsurgical Candidate (Clinical Staging), cT1, cN0, Unresectable/Nonsurgical Candidate Histology: Adenocarcinoma Disease Classification: GE Junction Therapeutic Status: Preoperative or Nonsurgical Candidate (Clinical Staging) AJCC Grade: GX AJCC 8 Stage Grouping: I AJCC T Category: cT1b AJCC N Category: cN0 AJCC M Category: cM0 Patient Characteristics: Unresectable/Nonsurgical Candidate Intent of Therapy: Curative Intent, Discussed with Patient

## 2021-04-30 ENCOUNTER — Ambulatory Visit: Payer: No Typology Code available for payment source

## 2021-05-08 ENCOUNTER — Encounter: Payer: Self-pay | Admitting: Radiation Oncology

## 2021-05-08 ENCOUNTER — Inpatient Hospital Stay: Payer: Medicare Other | Attending: Hematology and Oncology

## 2021-05-08 ENCOUNTER — Ambulatory Visit
Admission: RE | Admit: 2021-05-08 | Discharge: 2021-05-08 | Disposition: A | Discharge status: Home / Self Care | Payer: No Typology Code available for payment source | Source: Ambulatory Visit | Attending: Radiation Oncology | Admitting: Radiation Oncology

## 2021-05-08 DIAGNOSIS — C160 Malignant neoplasm of cardia: Secondary | ICD-10-CM | POA: Diagnosis not present

## 2021-05-08 DIAGNOSIS — C159 Malignant neoplasm of esophagus, unspecified: Secondary | ICD-10-CM

## 2021-05-08 DIAGNOSIS — C01 Malignant neoplasm of base of tongue: Secondary | ICD-10-CM | POA: Insufficient documentation

## 2021-05-08 DIAGNOSIS — Z923 Personal history of irradiation: Secondary | ICD-10-CM | POA: Diagnosis not present

## 2021-05-08 DIAGNOSIS — I82411 Acute embolism and thrombosis of right femoral vein: Secondary | ICD-10-CM | POA: Insufficient documentation

## 2021-05-08 DIAGNOSIS — Z7901 Long term (current) use of anticoagulants: Secondary | ICD-10-CM | POA: Insufficient documentation

## 2021-05-08 DIAGNOSIS — Z9221 Personal history of antineoplastic chemotherapy: Secondary | ICD-10-CM | POA: Diagnosis not present

## 2021-05-08 DIAGNOSIS — Z87891 Personal history of nicotine dependence: Secondary | ICD-10-CM | POA: Insufficient documentation

## 2021-05-08 DIAGNOSIS — C155 Malignant neoplasm of lower third of esophagus: Secondary | ICD-10-CM | POA: Insufficient documentation

## 2021-05-08 DIAGNOSIS — Z452 Encounter for adjustment and management of vascular access device: Secondary | ICD-10-CM | POA: Diagnosis not present

## 2021-05-08 DIAGNOSIS — R682 Dry mouth, unspecified: Secondary | ICD-10-CM | POA: Insufficient documentation

## 2021-05-08 DIAGNOSIS — I2699 Other pulmonary embolism without acute cor pulmonale: Secondary | ICD-10-CM | POA: Diagnosis not present

## 2021-05-08 MED ORDER — SODIUM CHLORIDE 0.9% FLUSH
10.0000 mL | Freq: Once | INTRAVENOUS | Status: AC
  Administered 2021-05-08: 10 mL via INTRAVENOUS
  Filled 2021-05-08: qty 10

## 2021-05-08 MED ORDER — HEPARIN SOD (PORK) LOCK FLUSH 100 UNIT/ML IV SOLN
INTRAVENOUS | Status: AC
  Filled 2021-05-08: qty 5

## 2021-05-08 MED ORDER — HEPARIN SOD (PORK) LOCK FLUSH 100 UNIT/ML IV SOLN
500.0000 [IU] | Freq: Once | INTRAVENOUS | Status: AC
  Administered 2021-05-08: 500 [IU] via INTRAVENOUS
  Filled 2021-05-08: qty 5

## 2021-05-08 NOTE — Progress Notes (Signed)
Radiation Oncology Follow up Note old patient new area esophagus  Name: Sean Garner.   Date:   05/08/2021 MRN:  [id number] DOB: 10-11-1946    This 75 y.o. male presents to the clinic today for evaluation of distal esophageal adenocarcinoma a T1b lesion and patient recently completing chemoradiation therapy for stage III (T2 N2 M0) squamous cell carcinoma base of tongue.  REFERRING PROVIDER: Derinda Late, MD  HPI: Patient is a 75 year old male well-known to our department having recently completed concurrent chemoradiation therapy for stage III squamous cell carcinoma tongue base.  He also was diagnosed by on PET scan with hypermetabolic activity in the distal esophagus.  Upper endoscopy revealed.  Intramucosal adenocarcinoma at least a T1 a moderately differentiated at the GE junction.  He was referred to [facility] with request to go ahead with concurrent chemoradiation therapy for stage Ib adenocarcinoma the GE junction.  He did have hypermetabolic activity in the's region of his esophagus on PET CT scan.  On upper endoscopy at [facility] he was noted to have a polypoid tumor seen at the GE junction consistent with location of the previous visualized tumor.  He is seen today for evaluation for concurrent chemoradiation.  He is doing fairly well still states he has somewhat of a dry mouth.  He is having no specific dysphagia.  COMPLICATIONS OF TREATMENT: none  FOLLOW UP COMPLIANCE: keeps appointments   PHYSICAL EXAM:  BP (!) (P) 162/86 (BP Location: Left Arm, Patient Position: Sitting)   Pulse (P) 68   Temp (P) 97.8 F (36.6 C) (Tympanic)   Resp (P) 18   Wt (P) 170 lb 12.8 oz (77.5 kg)   BMI (P) 24.51 kg/m  Well-developed well-nourished patient in NAD. HEENT reveals PERLA, EOMI, discs not visualized.  Oral cavity is clear. No oral mucosal lesions are identified. Neck is clear without evidence of cervical or supraclavicular adenopathy. Lungs are clear to A&P. Cardiac examination is essentially  unremarkable with regular rate and rhythm without murmur rub or thrill. Abdomen is benign with no organomegaly or masses noted. Motor sensory and DTR levels are equal and symmetric in the upper and lower extremities. Cranial nerves II through XII are grossly intact. Proprioception is intact. No peripheral adenopathy or edema is identified. No motor or sensory levels are noted. Crude visual fields are within normal range.  RADIOLOGY RESULTS: PET CT scans reviewed  PLAN: At this time elect to go ahead with IMRT radiation therapy to his esophagus along with concurrent chemotherapy.  I would go to a slightly higher dose than preoperative levels in hopes that we may avoid surgery down the road should he obtain a complete response on repeat endoscopy after concurrent chemoradiation.  Risks and benefits of treatment including increased dysphagia fatigue alteration of blood counts skin reaction all reviewed in detail with the patient.  I would choose IMRT to spare critical structures such as his heart spinal cord normal lung volume.  I have personally set up and ordered CT simulation for later this week.  Patient and wife both comprehend my treatment plan well.  I would like to take this opportunity to thank you for allowing me to participate in the care of your patient.Noreene Filbert, MD

## 2021-05-09 ENCOUNTER — Encounter: Payer: Self-pay | Admitting: Oncology

## 2021-05-09 ENCOUNTER — Encounter: Payer: Self-pay | Admitting: Hematology and Oncology

## 2021-05-10 ENCOUNTER — Ambulatory Visit
Admission: RE | Admit: 2021-05-10 | Discharge: 2021-05-10 | Disposition: A | Discharge status: Home / Self Care | Payer: No Typology Code available for payment source | Source: Ambulatory Visit | Attending: Radiation Oncology | Admitting: Radiation Oncology

## 2021-05-10 DIAGNOSIS — C01 Malignant neoplasm of base of tongue: Secondary | ICD-10-CM | POA: Insufficient documentation

## 2021-05-11 ENCOUNTER — Inpatient Hospital Stay: Payer: Medicare Other

## 2021-05-11 ENCOUNTER — Inpatient Hospital Stay (HOSPITAL_BASED_OUTPATIENT_CLINIC_OR_DEPARTMENT_OTHER): Payer: Medicare Other | Admitting: Oncology

## 2021-05-11 ENCOUNTER — Encounter: Payer: Self-pay | Admitting: Oncology

## 2021-05-11 ENCOUNTER — Other Ambulatory Visit: Payer: Self-pay

## 2021-05-11 DIAGNOSIS — C01 Malignant neoplasm of base of tongue: Secondary | ICD-10-CM | POA: Diagnosis not present

## 2021-05-11 DIAGNOSIS — C159 Malignant neoplasm of esophagus, unspecified: Secondary | ICD-10-CM

## 2021-05-11 LAB — COMPREHENSIVE METABOLIC PANEL
ALT: 16 U/L (ref 0–44)
AST: 20 U/L (ref 15–41)
Albumin: 4.6 g/dL (ref 3.5–5.0)
Alkaline Phosphatase: 47 U/L (ref 38–126)
Anion gap: 9 (ref 5–15)
BUN: 25 mg/dL — ABNORMAL HIGH (ref 8–23)
CO2: 28 mmol/L (ref 22–32)
Calcium: 10.2 mg/dL (ref 8.9–10.3)
Chloride: 99 mmol/L (ref 98–111)
Creatinine, Ser: 0.85 mg/dL (ref 0.61–1.24)
GFR, Estimated: >60 mL/min (ref >60)
Glucose, Bld: 108 mg/dL — ABNORMAL HIGH (ref 70–99)
Potassium: 4.2 mmol/L (ref 3.5–5.1)
Sodium: 136 mmol/L (ref 135–145)
Total Bilirubin: 0.7 mg/dL (ref 0.3–1.2)
Total Protein: 7.4 g/dL (ref 6.5–8.1)

## 2021-05-11 LAB — CBC WITH DIFFERENTIAL/PLATELET
Abs Immature Granulocytes: 0.04 10*3/uL (ref 0.00–0.07)
Basophils Absolute: 0 10*3/uL (ref 0.0–0.1)
Basophils Relative: 0 %
Eosinophils Absolute: 0.1 10*3/uL (ref 0.0–0.5)
Eosinophils Relative: 1 %
HCT: 42.8 % (ref 39.0–52.0)
Hemoglobin: 14.7 g/dL (ref 13.0–17.0)
Immature Granulocytes: 1 %
Lymphocytes Relative: 11 %
Lymphs Abs: 0.7 10*3/uL (ref 0.7–4.0)
MCH: 29.8 pg (ref 26.0–34.0)
MCHC: 34.3 g/dL (ref 30.0–36.0)
MCV: 86.8 fL (ref 80.0–100.0)
Monocytes Absolute: 0.4 10*3/uL (ref 0.1–1.0)
Monocytes Relative: 6 %
Neutro Abs: 5.7 10*3/uL (ref 1.7–7.7)
Neutrophils Relative %: 81 %
Platelets: 207 10*3/uL (ref 150–400)
RBC: 4.93 MIL/uL (ref 4.22–5.81)
RDW: 12.2 % (ref 11.5–15.5)
WBC: 7 10*3/uL (ref 4.0–10.5)
nRBC: 0 % (ref 0.0–0.2)

## 2021-05-11 NOTE — Progress Notes (Signed)
Pt here for follow up. No new concerns voiced.   

## 2021-05-11 NOTE — Progress Notes (Signed)
Hematology Oncology Progress Note   Clinic Day:  05/11/2021  Referring physician: Derinda Late, MD  Chief Complaint: Sean Garner. is a 75 y.o. 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  PERTINENT ONCOLOGY HISTORY Patient previously followed up by Dr.Corcoran, patient switched care to me on 05/11/21 Extensive medical record review was performed by me  Clinical stage I (T1N1Mx) right base of tongue carcinoma s/p ultrasound guided right cervical lymph node biopsy on 10/11/2020.  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    09/28/2020 Neck soft tissue CT on  revealed a 3.5 x 3.0 x 4.8 cm right neck mass most c/w enlarged lymph node. There was a 14 mm enhancing mass in the right base of tongue.   PET scan on 10/25/2020 revealed right tongue base primary with ipsilateral level 2 nodal metastasis. There were no findings of extracervical metastatic disease. There was focal hypermetabolism in the distal esophagus, with possible concurrent soft tissue density lesion. Recommended correlation with endoscopy. There was vague right lower lobe low-level hypermetabolism and ground-glass nodularity, favored minimal infection or aspiration. Incidental findings included aortic atherosclerosis, coronary artery atherosclerosis, emphysema, a tiny hiatal hernia, and prostatomegaly. 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  Speech discrimination scores were 100% in each ear.  11/03/2020 Upper endoscopy on  revealed a malignant esophageal tumor at the gastroesophageal junction. There were esophageal mucosal changes c/w short-segment Barrett's esophagus. There was a 2 cm hiatal hernia. There was gastritis.  Pathology in the esophagus at 36 cm revealed intramucosal adenocarcinoma arising in a background of high grade dysplasia  and intestinal metaplasia; pathology at 38 cm revealed adenocarcinoma.  11/15/2020 EUS on  revealed early stage adenocarcinoma arising from Barrett's esophagus.  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   11/20/2020 - 12/27/2020  6 weeks of cisplatin with concurrent radiation  completed radiation on 01/11/2021.  01/19/2021 Chest abdomen and pelvis CT with contrast on  revealed extensive nearly occlusive pulmonary thromboembolic disease bilaterally with CT evidence of right heart strain c/w at least submassive (intermediate risk) PE. There was no evidence of metastatic disease within the chest, abdomen or pelvis.  Improvement in previously demonstrated distal esophageal wall thickening without apparent focal mass lesion. There was aortic atherosclerosis and emphysema. 01/20/2021 Bilateral lower extremity duplex on  revealed occlusive DVT in the right deep femoral vein. Started on anticoagulation with heparin drip and discharged off Eliquis. Prothrombin gene mutation and factor 5 leiden were both negative  He has PTSD and clastrophobia.  Anxiety was relieved by alprazolam prior to radiation.  He is on citalopram 30 mg a day and trazodone at night.  He was admitted to [facility] from 01/19/2021 - 01/22/2021 with bilateral pulmonary emboli. Bilateral lower extremity venous duplex was positive for occlusive DVT in the right deep femoral vein. He was placed on  and discharged on Eliquis.   # Patient canceled her PET scan that was ordered for assessment of treatment response for head and neck cancer. 04/09/2021, patient was seen by Dr. Elenor Quinones and underwent EGD. Polypoid tumor was seen at the GE junction, consistent with the location of the previously visualized tumor.  With no significant difference compared to photos from other endoscopies.  The proximal esophagus is involved.  Esophagectomy will be applicable. Biopsy was taken at 38 cm.  Dr. Elenor Quinones has  reached out  radiation oncology Dr. Baruch Gouty and recommend neoadjuvant chemo and radiation.  INTERVAL HISTORY Sean Garner. is a 75 y.o. 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  Patient will start radiation on July 6th and he has some questions regarding his treatment plan.  Accompanied by wife.  He feels well, no dysphagia, unintentional weight loss.   Past Medical History:  Diagnosis Date   Anxiety    Cancer ([place])    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   Claustrophobia    Diabetes mellitus without complication ([place])    Esophageal adenocarcinoma ([place]) 04/21/2021   GERD (gastroesophageal reflux disease)    Glaucoma    using gtts for this.   Hypercholesterolemia    Hypertension     Past Surgical History:  Procedure Laterality Date   CATARACT EXTRACTION W/PHACO Left 12/03/2017   Procedure: CATARACT EXTRACTION PHACO AND INTRAOCULAR LENS PLACEMENT ([place]) COMPLICATED DIABETIC LEFT;  Surgeon: Leandrew Koyanagi, MD;  Location: [place];  Service: Ophthalmology;  Laterality: Left;  Diabetic - oral meds   CHOLECYSTECTOMY     COLONOSCOPY  2016   PORTA CATH INSERTION N/A 11/13/2020   Procedure: PORTA CATH INSERTION;  Surgeon: Algernon Huxley, MD;  Location: [place] [facility];  Service: Cardiovascular;  Laterality: N/A;    Family History  Problem Relation Age of Onset   Cancer Mother    Colon cancer Neg Hx    Colon polyps Neg Hx    Esophageal cancer Neg Hx    Rectal cancer Neg Hx    Stomach cancer Neg Hx     Social History:  reports that he quit smoking about 36 years ago. He has never used smokeless tobacco. He reports previous alcohol use. He reports that he does not use drugs. He quit smoking cold [place] in 1986. He smoked 2 packs per day for 20  years. He drinks a glass of wine or a Margarita occasionally. 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 He was exposed to [employer]. He worked with a Geologist, engineering for 34 years. The patient is accompanied by his wife today.  Allergies: No Known Allergies  Current Medications: Current Outpatient Medications  Medication Sig Dispense Refill   APIXABAN (ELIQUIS) VTE STARTER PACK (10MG  AND 5MG ) Take as directed on package: start with two-5mg  tablets twice daily for 7 days. On day 8 (01/29/2021), switch to one-5mg  tablet twice daily. 1 tablet 0   brimonidine-timolol (COMBIGAN) 0.2-0.5 % ophthalmic solution Place 1 drop into the left eye daily.     calcium carbonate (OS-CAL) 600 MG TABS tablet Take by mouth.     citalopram (CELEXA) 20 MG tablet Take 1 tablet (20 mg total) by mouth daily. 30 tablet 1   fluticasone (FLONASE) 50 MCG/ACT nasal spray Place into both nostrils.     glipiZIDE (GLUCOTROL XL) 5 MG 24 hr tablet Take 5 mg by mouth 2 (two) times daily.  1   glucose blood test strip      Magnesium Oxide (MAG-OXIDE) 200 MG TABS Take 400 mg by mouth daily.     metFORMIN (GLUCOPHAGE-XR) 500 MG 24 hr tablet Take 500 mg by mouth 2 (two) times daily. Takes 2 tablets and 1  tablet in the pm     pantoprazole (PROTONIX) 40 MG tablet Take by mouth.     simvastatin (ZOCOR) 40 MG tablet Take 40 mg by mouth daily at 6 PM.     traZODone (DESYREL) 50 MG tablet Take 0.5-1 tablets (25-50 mg total) by mouth at bedtime as needed for sleep. 30 tablet 2   vitamin B-12 (CYANOCOBALAMIN) 1000 MCG tablet Take 1,000 mcg by mouth daily.     VITAMIN D PO Take 800 Units by mouth daily.     ALPRAZolam (XANAX) 0.25 MG tablet Take 1 tablet (0.25 mg total) by mouth at bedtime as needed for anxiety. (Patient not taking: No sig reported) 60 tablet 0   magic mouthwash w/lidocaine SOLN Take 5 mLs by mouth. (Patient not taking: No sig reported)     sucralfate (CARAFATE) 1 g tablet Take 0.5 tablets (0.5 g total) by  mouth 3 (three) times daily. Dissolve in warm water, swish and swallow (Patient not taking: No sig reported) 60 tablet 1   Current Facility-Administered Medications  Medication Dose Route Frequency Provider Last Rate Last Admin   0.9 %  sodium chloride infusion  500 mL Intravenous Once Pyrtle, Lajuan Lines, MD       Facility-Administered Medications Ordered in Other Visits  Medication Dose Route Frequency Provider Last Rate Last Admin   0.9 % NaCl with KCl 20 mEq/ L  infusion   Intravenous Once Corcoran, Melissa C, MD       magnesium sulfate IVPB 2 g 50 mL  2 g Intravenous Once Lequita Asal, MD        Review of Systems  Constitutional: Negative.  Negative for chills, fever, malaise/fatigue and weight loss.  HENT:  Negative for congestion, ear pain and tinnitus.   Eyes: Negative.  Negative for blurred vision and double vision.  Respiratory: Negative.  Negative for cough, sputum production and shortness of breath.   Cardiovascular:  Positive for leg swelling (Right leg ). Negative for chest pain and palpitations.  Gastrointestinal: Negative.  Negative for abdominal pain, constipation, diarrhea, nausea and vomiting.  Genitourinary:  Negative for dysuria, frequency and urgency.  Musculoskeletal:  Negative for back pain and falls.  Skin: Negative.  Negative for rash.  Neurological: Negative.  Negative for weakness and headaches.  Endo/Heme/Allergies: Negative.  Does not bruise/bleed easily.  Psychiatric/Behavioral: Negative.  Negative for depression. The patient is not nervous/anxious and does not have insomnia.   Performance status (ECOG): 1  Vitals Blood pressure (!) 156/82, pulse (!) 59, temperature 97.6 F (36.4 C), resp. rate 18, weight 171 lb 6.4 oz (77.7 kg).   Physical Exam Vitals and nursing note reviewed.  Constitutional:      General: He is not in acute distress.    Appearance: He is not diaphoretic.  HENT:     Head: Normocephalic and atraumatic.     Comments: Gray hair.     Mouth/Throat:     Mouth: Mucous membranes are moist.     Pharynx: Oropharynx is clear.  Eyes:     General: No scleral icterus.    Extraocular Movements: Extraocular movements intact.     Conjunctiva/sclera: Conjunctivae normal.     Pupils: Pupils are equal, round, and reactive to light.     Comments: Blue eyes.  Cardiovascular:     Rate and Rhythm: Normal rate and regular rhythm.     Heart sounds: Normal heart sounds. No murmur heard. Pulmonary:     Effort: Pulmonary effort is normal. No respiratory distress.  Breath sounds: Normal breath sounds. No wheezing or rales.  Chest:     Chest wall: No tenderness.  Breasts:    Right: No axillary adenopathy or supraclavicular adenopathy.     Left: No axillary adenopathy or supraclavicular adenopathy.  Abdominal:     General: Bowel sounds are normal. There is no distension.     Palpations: Abdomen is soft. There is no mass.     Tenderness: There is no abdominal tenderness. There is no guarding or rebound.  Musculoskeletal:        General: No swelling or tenderness. Normal range of motion.     Cervical back: Normal range of motion and neck supple.     Right lower leg: Swelling present.     Right ankle: Swelling present.  Lymphadenopathy:     Head:     Right side of head: No preauricular, posterior auricular or occipital adenopathy.     Left side of head: No preauricular, posterior auricular or occipital adenopathy.     Upper Body:     Right upper body: No supraclavicular or axillary adenopathy.     Left upper body: No supraclavicular or axillary adenopathy.     Lower Body: No right inguinal adenopathy. No left inguinal adenopathy.  Skin:    General: Skin is warm and dry.     Comments: Tan around neck s/p radiation. Scar on right side of neck, fingertip sized.  Neurological:     Mental Status: He is alert and oriented to person, place, and time.     Comments: Left drop foot, improved.  Psychiatric:        Behavior: Behavior  normal.        Thought Content: Thought content normal.        Judgment: Judgment normal.   Appointment on 05/11/2021  Component Date Value Ref Range Status   Sodium 05/11/2021 136  135 - 145 mmol/L Final   Potassium 05/11/2021 4.2  3.5 - 5.1 mmol/L Final   Chloride 05/11/2021 99  98 - 111 mmol/L Final   CO2 05/11/2021 28  22 - 32 mmol/L Final   Glucose, Bld 05/11/2021 108 (A) 70 - 99 mg/dL Final   Glucose reference range applies only to samples taken after fasting for at least 8 hours.   BUN 05/11/2021 25 (A) 8 - 23 mg/dL Final   Creatinine, Ser 05/11/2021 0.85  0.61 - 1.24 mg/dL Final   Calcium 05/11/2021 10.2  8.9 - 10.3 mg/dL Final   Total Protein 05/11/2021 7.4  6.5 - 8.1 g/dL Final   Albumin 05/11/2021 4.6  3.5 - 5.0 g/dL Final   AST 05/11/2021 20  15 - 41 U/L Final   ALT 05/11/2021 16  0 - 44 U/L Final   Alkaline Phosphatase 05/11/2021 47  38 - 126 U/L Final   Total Bilirubin 05/11/2021 0.7  0.3 - 1.2 mg/dL Final   GFR, Estimated 05/11/2021 >60  >60 mL/min Final   Comment: (NOTE) Calculated using the CKD-EPI Creatinine Equation (2021)    Anion gap 05/11/2021 9  5 - 15 Final   Performed at [facility], [place], [place] [address]   WBC 05/11/2021 7.0  4.0 - 10.5 K/uL Final   RBC 05/11/2021 4.93  4.22 - 5.81 MIL/uL Final   Hemoglobin 05/11/2021 14.7  13.0 - 17.0 g/dL Final   HCT 05/11/2021 42.8  39.0 - 52.0 % Final   MCV 05/11/2021 86.8  80.0 - 100.0 fL Final   MCH 05/11/2021 29.8  26.0 - 34.0 pg Final   MCHC 05/11/2021 34.3  30.0 - 36.0 g/dL Final   RDW 05/11/2021 12.2  11.5 - 15.5 % Final   Platelets 05/11/2021 207  150 - 400 K/uL Final   nRBC 05/11/2021 0.0  0.0 - 0.2 % Final   Neutrophils Relative % 05/11/2021 81  % Final   Neutro Abs 05/11/2021 5.7  1.7 - 7.7 K/uL Final   Lymphocytes Relative 05/11/2021 11  % Final   Lymphs Abs 05/11/2021 0.7  0.7 - 4.0 K/uL Final   Monocytes Relative 05/11/2021 6  % Final   Monocytes Absolute 05/11/2021 0.4   0.1 - 1.0 K/uL Final   Eosinophils Relative 05/11/2021 1  % Final   Eosinophils Absolute 05/11/2021 0.1  0.0 - 0.5 K/uL Final   Basophils Relative 05/11/2021 0  % Final   Basophils Absolute 05/11/2021 0.0  0.0 - 0.1 K/uL Final   Immature Granulocytes 05/11/2021 1  % Final   Abs Immature Granulocytes 05/11/2021 0.04  0.00 - 0.07 K/uL Final   Performed at [facility], [address] [place], [place] [address]    Assessment/Plan.  1. Hypomagnesemia   2. Carcinoma of base of tongue ([place])   3. Esophageal cancer, stage IB ([place])     1. Clinical stage I right base of tongue carcinoma, Stage I, P16 positive.  He completed concurrent radiation and cisplatin on 01/11/2021. Patient declined PET scan and reluctant to reschedule.   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 EUS on 11/15/2020 revealed a T1bN0 lesion. 01/19/2021 Chest, abdomen and pelvis CT reveal improvement in the distal esophageal wall thickening. Patient has been evaluated by Dr. Elenor Quinones and s/p EGD,  he feels the lesion is not feasible for endoscopic resection. Dr. Elenor Quinones recommends neoadjuvant chemoradiation.   I explained to the patient the risks and benefits of chemotherapy including all but not limited to infusion reaction, hair loss, hearing loss, mouth sore, nausea, vomiting, low blood counts, bleeding, heart failure, kidney failure and risk of life threatening infection and even death, secondary malignancy etc.  Patient voices understanding and willing to proceed chemotherapy.    3   Bilateral pulmonary emboli and RLE DVT Continue Eliquis 5 mg twice daily  I discussed the assessment and treatment plan with the patient.  The patient was provided an opportunity to ask questions and all were answered.  The patient agreed with the plan and demonstrated an understanding of the instructions.  The patient was  advised to call back if the symptoms worsen or if the condition fails to improve as anticipated.  Earlie Server, MD, PhD Hematology Oncology [place] at [facility] 05/11/2021

## 2021-05-12 DIAGNOSIS — C01 Malignant neoplasm of base of tongue: Secondary | ICD-10-CM | POA: Diagnosis not present

## 2021-05-12 LAB — CEA: CEA: 0.9 ng/mL (ref 0.0–4.7)

## 2021-05-16 NOTE — Progress Notes (Signed)
Pharmacist Chemotherapy Monitoring - Initial Assessment    Anticipated start date: 05/23/21   The following has been reviewed per standard work regarding the patient's treatment regimen: The patient's diagnosis, treatment plan and drug doses, and organ/hematologic function Lab orders and baseline tests specific to treatment regimen  The treatment plan start date, drug sequencing, and pre-medications Prior authorization status  Patient's documented medication list, including drug-drug interaction screen and prescriptions for anti-emetics and supportive care specific to the treatment regimen The drug concentrations, fluid compatibility, administration routes, and timing of the medications to be used The patient's access for treatment and lifetime cumulative dose history, if applicable  The patient's medication allergies and previous infusion related reactions, if applicable   Changes made to treatment plan:  N/A  Follow up needed:  dose adjustment of   due to   Carbo dosing needs to be finalized  Adelina Mings, [place], 05/16/2021  12:55 PM

## 2021-05-17 ENCOUNTER — Ambulatory Visit: Payer: No Typology Code available for payment source

## 2021-05-22 ENCOUNTER — Ambulatory Visit: Admission: RE | Admit: 2021-05-22 | Payer: No Typology Code available for payment source | Source: Ambulatory Visit

## 2021-05-22 ENCOUNTER — Ambulatory Visit: Payer: No Typology Code available for payment source

## 2021-05-23 ENCOUNTER — Inpatient Hospital Stay: Payer: No Typology Code available for payment source

## 2021-05-23 ENCOUNTER — Ambulatory Visit
Admission: RE | Admit: 2021-05-23 | Discharge: 2021-05-23 | Disposition: A | Discharge status: Home / Self Care | Payer: No Typology Code available for payment source | Source: Ambulatory Visit | Attending: Radiation Oncology | Admitting: Radiation Oncology

## 2021-05-23 ENCOUNTER — Encounter: Payer: Self-pay | Admitting: Oncology

## 2021-05-23 ENCOUNTER — Inpatient Hospital Stay (HOSPITAL_BASED_OUTPATIENT_CLINIC_OR_DEPARTMENT_OTHER): Payer: No Typology Code available for payment source | Admitting: Oncology

## 2021-05-23 VITALS — BP 131/63 | HR 65 | Temp 96.1°F | Resp 16

## 2021-05-23 VITALS — BP 135/68 | HR 68 | Temp 96.1°F | Resp 18 | Wt 174.2 lb

## 2021-05-23 DIAGNOSIS — Z5111 Encounter for antineoplastic chemotherapy: Secondary | ICD-10-CM | POA: Insufficient documentation

## 2021-05-23 DIAGNOSIS — C01 Malignant neoplasm of base of tongue: Secondary | ICD-10-CM

## 2021-05-23 DIAGNOSIS — I2699 Other pulmonary embolism without acute cor pulmonale: Secondary | ICD-10-CM

## 2021-05-23 DIAGNOSIS — C159 Malignant neoplasm of esophagus, unspecified: Secondary | ICD-10-CM

## 2021-05-23 DIAGNOSIS — C160 Malignant neoplasm of cardia: Secondary | ICD-10-CM | POA: Insufficient documentation

## 2021-05-23 LAB — CBC WITH DIFFERENTIAL/PLATELET
Abs Immature Granulocytes: 0.04 10*3/uL (ref 0.00–0.07)
Basophils Absolute: 0 10*3/uL (ref 0.0–0.1)
Basophils Relative: 0 %
Eosinophils Absolute: 0.1 10*3/uL (ref 0.0–0.5)
Eosinophils Relative: 1 %
HCT: 38.4 % — ABNORMAL LOW (ref 39.0–52.0)
Hemoglobin: 13.2 g/dL (ref 13.0–17.0)
Immature Granulocytes: 1 %
Lymphocytes Relative: 10 %
Lymphs Abs: 0.7 10*3/uL (ref 0.7–4.0)
MCH: 30.2 pg (ref 26.0–34.0)
MCHC: 34.4 g/dL (ref 30.0–36.0)
MCV: 87.9 fL (ref 80.0–100.0)
Monocytes Absolute: 0.5 10*3/uL (ref 0.1–1.0)
Monocytes Relative: 7 %
Neutro Abs: 5.6 10*3/uL (ref 1.7–7.7)
Neutrophils Relative %: 81 %
Platelets: 204 10*3/uL (ref 150–400)
RBC: 4.37 MIL/uL (ref 4.22–5.81)
RDW: 12.5 % (ref 11.5–15.5)
WBC: 6.9 10*3/uL (ref 4.0–10.5)
nRBC: 0 % (ref 0.0–0.2)

## 2021-05-23 LAB — COMPREHENSIVE METABOLIC PANEL
ALT: 19 U/L (ref 0–44)
AST: 19 U/L (ref 15–41)
Albumin: 4 g/dL (ref 3.5–5.0)
Alkaline Phosphatase: 47 U/L (ref 38–126)
Anion gap: 6 (ref 5–15)
BUN: 23 mg/dL (ref 8–23)
CO2: 30 mmol/L (ref 22–32)
Calcium: 9.5 mg/dL (ref 8.9–10.3)
Chloride: 100 mmol/L (ref 98–111)
Creatinine, Ser: 0.88 mg/dL (ref 0.61–1.24)
GFR, Estimated: >60 mL/min (ref >60)
Glucose, Bld: 137 mg/dL — ABNORMAL HIGH (ref 70–99)
Potassium: 3.9 mmol/L (ref 3.5–5.1)
Sodium: 136 mmol/L (ref 135–145)
Total Bilirubin: 0.9 mg/dL (ref 0.3–1.2)
Total Protein: 6.6 g/dL (ref 6.5–8.1)

## 2021-05-23 LAB — MAGNESIUM: Magnesium: 1.7 mg/dL (ref 1.7–2.4)

## 2021-05-23 MED ORDER — SODIUM CHLORIDE 0.9 % IV SOLN
195.2000 mg | Freq: Once | INTRAVENOUS | Status: AC
  Administered 2021-05-23: 200 mg via INTRAVENOUS
  Filled 2021-05-23: qty 20

## 2021-05-23 MED ORDER — DIPHENHYDRAMINE HCL 50 MG/ML IJ SOLN
50.0000 mg | Freq: Once | INTRAMUSCULAR | Status: AC
  Administered 2021-05-23: 50 mg via INTRAVENOUS
  Filled 2021-05-23: qty 1

## 2021-05-23 MED ORDER — PALONOSETRON HCL INJECTION 0.25 MG/5ML
0.2500 mg | Freq: Once | INTRAVENOUS | Status: AC
  Administered 2021-05-23: 0.25 mg via INTRAVENOUS
  Filled 2021-05-23: qty 5

## 2021-05-23 MED ORDER — HEPARIN SOD (PORK) LOCK FLUSH 100 UNIT/ML IV SOLN
INTRAVENOUS | Status: AC
  Filled 2021-05-23: qty 5

## 2021-05-23 MED ORDER — SODIUM CHLORIDE 0.9 % IV SOLN
Freq: Once | INTRAVENOUS | Status: AC
  Filled 2021-05-23: qty 250

## 2021-05-23 MED ORDER — HEPARIN SOD (PORK) LOCK FLUSH 100 UNIT/ML IV SOLN
500.0000 [IU] | Freq: Once | INTRAVENOUS | Status: AC | PRN
  Administered 2021-05-23: 500 [IU]
  Filled 2021-05-23: qty 5

## 2021-05-23 MED ORDER — SODIUM CHLORIDE 0.9 % IV SOLN
50.0000 mg/m2 | Freq: Once | INTRAVENOUS | Status: AC
  Administered 2021-05-23: 102 mg via INTRAVENOUS
  Filled 2021-05-23: qty 17

## 2021-05-23 MED ORDER — DEXAMETHASONE SODIUM PHOSPHATE 100 MG/10ML IJ SOLN
20.0000 mg | Freq: Once | INTRAMUSCULAR | Status: AC
Start: 2021-05-23 — End: 2021-05-23
  Administered 2021-05-23: 20 mg via INTRAVENOUS
  Filled 2021-05-23: qty 20

## 2021-05-23 MED ORDER — FAMOTIDINE 20 MG IN NS 100 ML IVPB
20.0000 mg | Freq: Once | INTRAVENOUS | Status: AC
  Administered 2021-05-23: 20 mg via INTRAVENOUS
  Filled 2021-05-23: qty 20

## 2021-05-23 NOTE — Progress Notes (Signed)
Oncology follow up today. Due to start a new treatment today

## 2021-05-23 NOTE — Patient Instructions (Signed)
CANCER CENTER [place] REGIONAL MEDICAL ONCOLOGY  Discharge Instructions: Thank you for choosing [facility] to provide your oncology and hematology care.  If you have a lab appointment with the Cancer Center, please go directly to the Cancer Center and check in at the registration area.  Wear comfortable clothing and clothing appropriate for easy access to any Portacath or PICC line.   We strive to give you quality time with your provider. You may need to reschedule your appointment if you arrive late (15 or more minutes).  Arriving late affects you and other patients whose appointments are after yours.  Also, if you miss three or more appointments without notifying the office, you may be dismissed from the clinic at the provider's discretion.      For prescription refill requests, have your pharmacy contact our office and allow 72 hours for refills to be completed.    Today you received the following chemotherapy and/or immunotherapy agents Carboplatin & Taxol      To help prevent nausea and vomiting after your treatment, we encourage you to take your nausea medication as directed.  BELOW ARE SYMPTOMS THAT SHOULD BE REPORTED IMMEDIATELY: *FEVER GREATER THAN 100.4 F (38 C) OR HIGHER *CHILLS OR SWEATING *NAUSEA AND VOMITING THAT IS NOT CONTROLLED WITH YOUR NAUSEA MEDICATION *UNUSUAL SHORTNESS OF BREATH *UNUSUAL BRUISING OR BLEEDING *URINARY PROBLEMS (pain or burning when urinating, or frequent urination) *BOWEL PROBLEMS (unusual diarrhea, constipation, pain near the anus) TENDERNESS IN MOUTH AND THROAT WITH OR WITHOUT PRESENCE OF ULCERS (sore throat, sores in mouth, or a toothache) UNUSUAL RASH, SWELLING OR PAIN  UNUSUAL VAGINAL DISCHARGE OR ITCHING   Items with * indicate a potential emergency and should be followed up as soon as possible or go to the Emergency Department if any problems should occur.  Please show the CHEMOTHERAPY ALERT CARD or IMMUNOTHERAPY ALERT CARD at  check-in to the Emergency Department and triage nurse.  Should you have questions after your visit or need to cancel or reschedule your appointment, please contact CANCER CENTER  REGIONAL MEDICAL ONCOLOGY  [phone] and follow the prompts.  Office hours are 8:00 a.m. to 4:30 p.m. Monday - Friday. Please note that voicemails left after 4:00 p.m. may not be returned until the following business day.  We are closed weekends and major holidays. You have access to a nurse at all times for urgent questions. Please call the main number to the clinic [phone] and follow the prompts.  For any non-urgent questions, you may also contact your provider using MyChart. We now offer e-Visits for anyone 18 and older to request care online for non-urgent symptoms. For details visit [email or web address].   Also download the MyChart app! Go to the app store, search "MyChart", open the app, select [place] [facility], and log in with your MyChart username and password.  Due to Covid, a mask is required upon entering the hospital/clinic. If you do not have a mask, one will be given to you upon arrival. For doctor visits, patients may have 1 support person aged 18 or older with them. For treatment visits, patients cannot have anyone with them due to current Covid guidelines and our immunocompromised population.  

## 2021-05-23 NOTE — Progress Notes (Signed)
Patient tolerated first Carbo-Taxol treatment well. Patient and VSS. Discharged home.

## 2021-05-23 NOTE — Progress Notes (Signed)
Hematology Oncology Progress Note   Clinic Day:  05/23/2021  Referring physician: Derinda Late, MD  Chief Complaint: Sean Garner. is a 75 y.o. 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  PERTINENT ONCOLOGY HISTORY Patient previously followed up by Dr.Corcoran, patient switched care to me on 05/23/21 Extensive medical record review was performed by me  Clinical stage I (T1N1Mx) right base of tongue carcinoma s/p ultrasound guided right cervical lymph node biopsy on 10/11/2020.  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    09/28/2020 Neck soft tissue CT on  revealed a 3.5 x 3.0 x 4.8 cm right neck mass most c/w enlarged lymph node. There was a 14 mm enhancing mass in the right base of tongue.   PET scan on 10/25/2020 revealed right tongue base primary with ipsilateral level 2 nodal metastasis. There were no findings of extracervical metastatic disease. There was focal hypermetabolism in the distal esophagus, with possible concurrent soft tissue density lesion. Recommended correlation with endoscopy. There was vague right lower lobe low-level hypermetabolism and ground-glass nodularity, favored minimal infection or aspiration. Incidental findings included aortic atherosclerosis, coronary artery atherosclerosis, emphysema, a tiny hiatal hernia, and prostatomegaly. 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  Speech discrimination scores were 100% in each ear.  11/03/2020 Upper endoscopy on  revealed a malignant esophageal tumor at the gastroesophageal junction. There were esophageal mucosal changes c/w short-segment Barrett's esophagus. There was a 2 cm hiatal hernia. There was gastritis.  Pathology in the esophagus at 36 cm revealed intramucosal adenocarcinoma arising in a background of high grade dysplasia  and intestinal metaplasia; pathology at 38 cm revealed adenocarcinoma.  11/15/2020 EUS on  revealed early stage adenocarcinoma arising from Barrett's esophagus.  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   11/20/2020 - 12/27/2020  6 weeks of cisplatin with concurrent radiation  completed radiation on 01/11/2021.  01/19/2021 Chest abdomen and pelvis CT with contrast on  revealed extensive nearly occlusive pulmonary thromboembolic disease bilaterally with CT evidence of right heart strain c/w at least submassive (intermediate risk) PE. There was no evidence of metastatic disease within the chest, abdomen or pelvis.  Improvement in previously demonstrated distal esophageal wall thickening without apparent focal mass lesion. There was aortic atherosclerosis and emphysema. 01/20/2021 Bilateral lower extremity duplex on  revealed occlusive DVT in the right deep femoral vein. Started on anticoagulation with heparin drip and discharged off Eliquis. Prothrombin gene mutation and factor 5 leiden were both negative  He has PTSD and clastrophobia.  Anxiety was relieved by alprazolam prior to radiation.  He is on citalopram 30 mg a day and trazodone at night.  He was admitted to [facility] from 01/19/2021 - 01/22/2021 with bilateral pulmonary emboli. Bilateral lower extremity venous duplex was positive for occlusive DVT in the right deep femoral vein. He was placed on  and discharged on Eliquis.   # Patient canceled her PET scan that was ordered for assessment of treatment response for head and neck cancer. 04/09/2021, patient was seen by Dr. Elenor Quinones and underwent EGD. Polypoid tumor was seen at the GE junction, consistent with the location of the previously visualized tumor.  With no significant difference compared to photos from other endoscopies.  The proximal esophagus is involved.  Esophagectomy will be applicable. Biopsy was taken at 38 cm.  Dr. Elenor Quinones has  reached out  radiation oncology Dr. Baruch Gouty and recommend neoadjuvant chemo and radiation.  INTERVAL HISTORY Sean Garner. is a 75 y.o. 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  Reports feeling well, started RT today.  He feels well, no dysphagia, unintentional weight loss.   Past Medical History:  Diagnosis Date   Anxiety    Cancer ([place])    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   Claustrophobia    Diabetes mellitus without complication ([place])    Esophageal adenocarcinoma ([place]) 04/21/2021   GERD (gastroesophageal reflux disease)    Glaucoma    using gtts for this.   Hypercholesterolemia    Hypertension     Past Surgical History:  Procedure Laterality Date   CATARACT EXTRACTION W/PHACO Left 12/03/2017   Procedure: CATARACT EXTRACTION PHACO AND INTRAOCULAR LENS PLACEMENT ([place]) COMPLICATED DIABETIC LEFT;  Surgeon: Leandrew Koyanagi, MD;  Location: [facility];  Service: Ophthalmology;  Laterality: Left;  Diabetic - oral meds   CHOLECYSTECTOMY     COLONOSCOPY  2016   PORTA CATH INSERTION N/A 11/13/2020   Procedure: PORTA CATH INSERTION;  Surgeon: Algernon Huxley, MD;  Location: [facility];  Service: Cardiovascular;  Laterality: N/A;    Family History  Problem Relation Age of Onset   Cancer Mother    Colon cancer Neg Hx    Colon polyps Neg Hx    Esophageal cancer Neg Hx    Rectal cancer Neg Hx    Stomach cancer Neg Hx     Social History:  reports that he quit smoking about 36 years ago. He has never used smokeless tobacco. He reports previous alcohol use. He reports that he does not use drugs. He quit smoking cold [place] in 1986. He smoked 2 packs per day for 20 years. He drinks a glass of wine or a Margarita occasionally. 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 He was exposed to [employer]. He worked with a Geologist, engineering for 34 years. The patient is accompanied by his wife today.  Allergies: No Known Allergies  Current Medications: Current Outpatient Medications  Medication Sig Dispense Refill   APIXABAN (ELIQUIS) VTE STARTER PACK (10MG  AND 5MG ) Take as directed on package: start with two-5mg  tablets twice daily for 7 days. On day 8 (01/29/2021), switch to one-5mg  tablet twice daily. 1 tablet 0   brimonidine-timolol (COMBIGAN) 0.2-0.5 % ophthalmic solution Place 1 drop into the left eye daily.     calcium carbonate (OS-CAL) 600 MG TABS tablet Take by mouth.     citalopram (CELEXA) 20 MG tablet Take 1 tablet (20 mg total) by mouth daily. 30 tablet 1   fluticasone (FLONASE) 50 MCG/ACT nasal spray Place into both nostrils.     glipiZIDE (GLUCOTROL XL) 5 MG 24 hr tablet Take 5 mg by mouth 2 (two) times daily.  1   glucose blood test strip      Magnesium Oxide (MAG-OXIDE) 200 MG TABS Take 400 mg by mouth daily.     metFORMIN (GLUCOPHAGE-XR) 500 MG 24 hr tablet Take 500 mg by mouth 2 (two) times daily. Takes 2 tablets and 1 tablet in the pm     pantoprazole (PROTONIX) 40 MG tablet Take  by mouth.     simvastatin (ZOCOR) 40 MG tablet Take 40 mg by mouth daily at 6 PM.     vitamin B-12 (CYANOCOBALAMIN) 1000 MCG tablet Take 1,000 mcg by mouth daily.     VITAMIN D PO Take 800 Units by mouth daily.     ALPRAZolam (XANAX) 0.25 MG tablet Take 1 tablet (0.25 mg total) by mouth at bedtime as needed for anxiety. (Patient not taking: No sig reported) 60 tablet 0   magic mouthwash w/lidocaine SOLN Take 5 mLs by mouth. (Patient not taking: No sig reported)     sucralfate (CARAFATE) 1 g tablet Take 0.5 tablets (0.5 g total) by mouth 3 (three) times daily. Dissolve in warm water, swish and swallow (Patient not taking: No sig reported) 60 tablet 1   traZODone (DESYREL) 50 MG tablet Take 0.5-1 tablets (25-50 mg total) by mouth at bedtime  as needed for sleep. (Patient not taking: Reported on 05/23/2021) 30 tablet 2   Current Facility-Administered Medications  Medication Dose Route Frequency Provider Last Rate Last Admin   0.9 %  sodium chloride infusion  500 mL Intravenous Once Pyrtle, Lajuan Lines, MD       Facility-Administered Medications Ordered in Other Visits  Medication Dose Route Frequency Provider Last Rate Last Admin   0.9 % NaCl with KCl 20 mEq/ L  infusion   Intravenous Once Corcoran, Melissa C, MD       CARBOplatin (PARAPLATIN) 200 mg in sodium chloride 0.9 % 250 mL chemo infusion  200 mg Intravenous Once Earlie Server, MD       heparin lock flush 100 unit/mL  500 Units Intracatheter Once PRN Earlie Server, MD       magnesium sulfate IVPB 2 g 50 mL  2 g Intravenous Once Lequita Asal, MD       PACLitaxel (TAXOL) 102 mg in sodium chloride 0.9 % 250 mL chemo infusion (</= 80mg /m2)  50 mg/m2 (Treatment Plan Recorded) Intravenous Once Earlie Server, MD 267 mL/hr at 05/23/21 1252 102 mg at 05/23/21 1252    Review of Systems  Constitutional: Negative.  Negative for chills, fever, malaise/fatigue and weight loss.  HENT:  Negative for congestion, ear pain and tinnitus.   Eyes: Negative.  Negative for blurred vision and double vision.  Respiratory: Negative.  Negative for cough, sputum production and shortness of breath.   Cardiovascular:  Positive for leg swelling (Right leg ). Negative for chest pain and palpitations.  Gastrointestinal: Negative.  Negative for abdominal pain, constipation, diarrhea, nausea and vomiting.  Genitourinary:  Negative for dysuria, frequency and urgency.  Musculoskeletal:  Negative for back pain and falls.  Skin: Negative.  Negative for rash.  Neurological: Negative.  Negative for weakness and headaches.  Endo/Heme/Allergies: Negative.  Does not bruise/bleed easily.  Psychiatric/Behavioral: Negative.  Negative for depression. The patient is not nervous/anxious and does not have insomnia.   Performance status  (ECOG): 1  Vitals Blood pressure 135/68, pulse 68, temperature (!) 96.1 F (35.6 C), temperature source Tympanic, resp. rate 18, weight 174 lb 3.2 oz (79 kg), SpO2 99 %.   Physical Exam Vitals and nursing note reviewed.  Constitutional:      General: He is not in acute distress.    Appearance: He is not diaphoretic.  HENT:     Head: Normocephalic and atraumatic.     Comments: Gray hair.    Mouth/Throat:     Mouth: Mucous membranes are moist.     Pharynx: Oropharynx is clear.  Eyes:  General: No scleral icterus.    Extraocular Movements: Extraocular movements intact.     Conjunctiva/sclera: Conjunctivae normal.     Pupils: Pupils are equal, round, and reactive to light.     Comments: Blue eyes.  Cardiovascular:     Rate and Rhythm: Normal rate and regular rhythm.     Heart sounds: Normal heart sounds. No murmur heard. Pulmonary:     Effort: Pulmonary effort is normal. No respiratory distress.     Breath sounds: Normal breath sounds. No wheezing or rales.  Chest:     Chest wall: No tenderness.  Breasts:    Right: No axillary adenopathy or supraclavicular adenopathy.     Left: No axillary adenopathy or supraclavicular adenopathy.  Abdominal:     General: Bowel sounds are normal. There is no distension.     Palpations: Abdomen is soft. There is no mass.     Tenderness: There is no abdominal tenderness. There is no guarding or rebound.  Musculoskeletal:        General: No swelling or tenderness. Normal range of motion.     Cervical back: Normal range of motion and neck supple.     Right lower leg: Swelling present.     Right ankle: Swelling present.  Lymphadenopathy:     Head:     Right side of head: No preauricular, posterior auricular or occipital adenopathy.     Left side of head: No preauricular, posterior auricular or occipital adenopathy.     Upper Body:     Right upper body: No supraclavicular or axillary adenopathy.     Left upper body: No supraclavicular or  axillary adenopathy.     Lower Body: No right inguinal adenopathy. No left inguinal adenopathy.  Skin:    General: Skin is warm and dry.     Comments: Tan around neck s/p radiation. Scar on right side of neck, fingertip sized.  Neurological:     Mental Status: He is alert and oriented to person, place, and time.     Comments: Left drop foot, improved.  Psychiatric:        Behavior: Behavior normal.        Thought Content: Thought content normal.        Judgment: Judgment normal.   Infusion on 05/23/2021  Component Date Value Ref Range Status   Magnesium 05/23/2021 1.7  1.7 - 2.4 mg/dL Final   Performed at [facility], [place], [place] [address]   Sodium 05/23/2021 136  135 - 145 mmol/L Final   Potassium 05/23/2021 3.9  3.5 - 5.1 mmol/L Final   Chloride 05/23/2021 100  98 - 111 mmol/L Final   CO2 05/23/2021 30  22 - 32 mmol/L Final   Glucose, Bld 05/23/2021 137 (A) 70 - 99 mg/dL Final   Glucose reference range applies only to samples taken after fasting for at least 8 hours.   BUN 05/23/2021 23  8 - 23 mg/dL Final   Creatinine, Ser 05/23/2021 0.88  0.61 - 1.24 mg/dL Final   Calcium 05/23/2021 9.5  8.9 - 10.3 mg/dL Final   Total Protein 05/23/2021 6.6  6.5 - 8.1 g/dL Final   Albumin 05/23/2021 4.0  3.5 - 5.0 g/dL Final   AST 05/23/2021 19  15 - 41 U/L Final   ALT 05/23/2021 19  0 - 44 U/L Final   Alkaline Phosphatase 05/23/2021 47  38 - 126 U/L Final   Total Bilirubin 05/23/2021 0.9  0.3 - 1.2 mg/dL Final  GFR, Estimated 05/23/2021 >60  >60 mL/min Final   Comment: (NOTE) Calculated using the CKD-EPI Creatinine Equation (2021)    Anion gap 05/23/2021 6  5 - 15 Final   Performed at [facility], [address] [place], [place] [address]   WBC 05/23/2021 6.9  4.0 - 10.5 K/uL Final   RBC 05/23/2021 4.37  4.22 - 5.81 MIL/uL Final   Hemoglobin 05/23/2021 13.2  13.0 - 17.0 g/dL Final   HCT 05/23/2021 38.4 (A) 39.0 - 52.0 % Final   MCV 05/23/2021 87.9   80.0 - 100.0 fL Final   MCH 05/23/2021 30.2  26.0 - 34.0 pg Final   MCHC 05/23/2021 34.4  30.0 - 36.0 g/dL Final   RDW 05/23/2021 12.5  11.5 - 15.5 % Final   Platelets 05/23/2021 204  150 - 400 K/uL Final   nRBC 05/23/2021 0.0  0.0 - 0.2 % Final   Neutrophils Relative % 05/23/2021 81  % Final   Neutro Abs 05/23/2021 5.6  1.7 - 7.7 K/uL Final   Lymphocytes Relative 05/23/2021 10  % Final   Lymphs Abs 05/23/2021 0.7  0.7 - 4.0 K/uL Final   Monocytes Relative 05/23/2021 7  % Final   Monocytes Absolute 05/23/2021 0.5  0.1 - 1.0 K/uL Final   Eosinophils Relative 05/23/2021 1  % Final   Eosinophils Absolute 05/23/2021 0.1  0.0 - 0.5 K/uL Final   Basophils Relative 05/23/2021 0  % Final   Basophils Absolute 05/23/2021 0.0  0.0 - 0.1 K/uL Final   Immature Granulocytes 05/23/2021 1  % Final   Abs Immature Granulocytes 05/23/2021 0.04  0.00 - 0.07 K/uL Final   Performed at [facility], [address], [place], [place] [address]    Assessment/Plan.  1. Esophageal cancer, stage IB (H[facility])   2. Carcinoma of base of tongue ([place])   3. Pulmonary embolism, bilateral ([place])   4. Hypomagnesemia   5. Encounter for antineoplastic chemotherapy     1. Clinical stage I right base of tongue carcinoma, Stage I, P16 positive.  He completed concurrent radiation and cisplatin on 01/11/2021. Patient declined PET scan and is reluctant to reschedule. 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 Proceed with weekly dose of carboplatin and Taxol today.  3   Bilateral pulmonary emboli and RLE DVT Continue Eliquis 5 mg twice daily  I discussed the assessment and treatment plan with the patient.  The patient was provided an opportunity to ask questions and all were answered.  The patient agreed with the plan and demonstrated an understanding of the instructions.  The patient was advised to  call back if the symptoms worsen or if the condition fails to improve as anticipated.  Follow up in 1 week for lab md carboplatin and taxol.    Earlie Server, MD, PhD Hematology Oncology [place] at [facility] 05/23/2021

## 2021-05-24 ENCOUNTER — Ambulatory Visit
Admission: RE | Admit: 2021-05-24 | Discharge: 2021-05-24 | Disposition: A | Discharge status: Home / Self Care | Payer: No Typology Code available for payment source | Source: Ambulatory Visit | Attending: Radiation Oncology | Admitting: Radiation Oncology

## 2021-05-24 DIAGNOSIS — C01 Malignant neoplasm of base of tongue: Secondary | ICD-10-CM | POA: Diagnosis not present

## 2021-05-25 ENCOUNTER — Ambulatory Visit
Admission: RE | Admit: 2021-05-25 | Discharge: 2021-05-25 | Disposition: A | Discharge status: Home / Self Care | Payer: No Typology Code available for payment source | Source: Ambulatory Visit | Attending: Radiation Oncology | Admitting: Radiation Oncology

## 2021-05-25 DIAGNOSIS — C01 Malignant neoplasm of base of tongue: Secondary | ICD-10-CM | POA: Diagnosis not present

## 2021-05-28 ENCOUNTER — Ambulatory Visit
Admission: RE | Admit: 2021-05-28 | Discharge: 2021-05-28 | Disposition: A | Discharge status: Home / Self Care | Payer: No Typology Code available for payment source | Source: Ambulatory Visit | Attending: Radiation Oncology | Admitting: Radiation Oncology

## 2021-05-28 DIAGNOSIS — C01 Malignant neoplasm of base of tongue: Secondary | ICD-10-CM | POA: Diagnosis not present

## 2021-05-29 ENCOUNTER — Ambulatory Visit
Admission: RE | Admit: 2021-05-29 | Discharge: 2021-05-29 | Disposition: A | Discharge status: Home / Self Care | Payer: No Typology Code available for payment source | Source: Ambulatory Visit | Attending: Radiation Oncology | Admitting: Radiation Oncology

## 2021-05-29 DIAGNOSIS — C01 Malignant neoplasm of base of tongue: Secondary | ICD-10-CM | POA: Diagnosis not present

## 2021-05-30 ENCOUNTER — Inpatient Hospital Stay: Payer: No Typology Code available for payment source

## 2021-05-30 ENCOUNTER — Other Ambulatory Visit: Payer: Self-pay

## 2021-05-30 ENCOUNTER — Inpatient Hospital Stay (HOSPITAL_BASED_OUTPATIENT_CLINIC_OR_DEPARTMENT_OTHER): Payer: No Typology Code available for payment source | Admitting: Oncology

## 2021-05-30 ENCOUNTER — Encounter: Payer: Self-pay | Admitting: Oncology

## 2021-05-30 ENCOUNTER — Ambulatory Visit
Admission: RE | Admit: 2021-05-30 | Discharge: 2021-05-30 | Disposition: A | Discharge status: Home / Self Care | Payer: No Typology Code available for payment source | Source: Ambulatory Visit | Attending: Radiation Oncology | Admitting: Radiation Oncology

## 2021-05-30 VITALS — BP 139/76 | HR 70 | Temp 97.4°F | Wt 174.0 lb

## 2021-05-30 DIAGNOSIS — C159 Malignant neoplasm of esophagus, unspecified: Secondary | ICD-10-CM

## 2021-05-30 DIAGNOSIS — C01 Malignant neoplasm of base of tongue: Secondary | ICD-10-CM

## 2021-05-30 DIAGNOSIS — Z5111 Encounter for antineoplastic chemotherapy: Secondary | ICD-10-CM

## 2021-05-30 DIAGNOSIS — I2699 Other pulmonary embolism without acute cor pulmonale: Secondary | ICD-10-CM

## 2021-05-30 DIAGNOSIS — Z86711 Personal history of pulmonary embolism: Secondary | ICD-10-CM | POA: Insufficient documentation

## 2021-05-30 LAB — COMPREHENSIVE METABOLIC PANEL
ALT: 20 U/L (ref 0–44)
AST: 21 U/L (ref 15–41)
Albumin: 4.2 g/dL (ref 3.5–5.0)
Alkaline Phosphatase: 47 U/L (ref 38–126)
Anion gap: 10 (ref 5–15)
BUN: 20 mg/dL (ref 8–23)
CO2: 25 mmol/L (ref 22–32)
Calcium: 9.3 mg/dL (ref 8.9–10.3)
Chloride: 99 mmol/L (ref 98–111)
Creatinine, Ser: 0.89 mg/dL (ref 0.61–1.24)
GFR, Estimated: >60 mL/min (ref >60)
Glucose, Bld: 258 mg/dL — ABNORMAL HIGH (ref 70–99)
Potassium: 4 mmol/L (ref 3.5–5.1)
Sodium: 134 mmol/L — ABNORMAL LOW (ref 135–145)
Total Bilirubin: 0.6 mg/dL (ref 0.3–1.2)
Total Protein: 6.8 g/dL (ref 6.5–8.1)

## 2021-05-30 LAB — CBC WITH DIFFERENTIAL/PLATELET
Abs Immature Granulocytes: 0.06 10*3/uL (ref 0.00–0.07)
Basophils Absolute: 0 10*3/uL (ref 0.0–0.1)
Basophils Relative: 0 %
Eosinophils Absolute: 0 10*3/uL (ref 0.0–0.5)
Eosinophils Relative: 1 %
HCT: 36.6 % — ABNORMAL LOW (ref 39.0–52.0)
Hemoglobin: 12.8 g/dL — ABNORMAL LOW (ref 13.0–17.0)
Immature Granulocytes: 1 %
Lymphocytes Relative: 8 %
Lymphs Abs: 0.4 10*3/uL — ABNORMAL LOW (ref 0.7–4.0)
MCH: 30.5 pg (ref 26.0–34.0)
MCHC: 35 g/dL (ref 30.0–36.0)
MCV: 87.4 fL (ref 80.0–100.0)
Monocytes Absolute: 0.2 10*3/uL (ref 0.1–1.0)
Monocytes Relative: 4 %
Neutro Abs: 4.8 10*3/uL (ref 1.7–7.7)
Neutrophils Relative %: 86 %
Platelets: 201 10*3/uL (ref 150–400)
RBC: 4.19 MIL/uL — ABNORMAL LOW (ref 4.22–5.81)
RDW: 12.3 % (ref 11.5–15.5)
WBC: 5.6 10*3/uL (ref 4.0–10.5)
nRBC: 0 % (ref 0.0–0.2)

## 2021-05-30 MED ORDER — SODIUM CHLORIDE 0.9% FLUSH
10.0000 mL | Freq: Once | INTRAVENOUS | Status: AC
  Administered 2021-05-30: 10 mL via INTRAVENOUS
  Filled 2021-05-30: qty 10

## 2021-05-30 MED ORDER — HEPARIN SOD (PORK) LOCK FLUSH 100 UNIT/ML IV SOLN
500.0000 [IU] | Freq: Once | INTRAVENOUS | Status: AC | PRN
  Administered 2021-05-30: 500 [IU]
  Filled 2021-05-30: qty 5

## 2021-05-30 MED ORDER — SODIUM CHLORIDE 0.9 % IV SOLN
50.0000 mg/m2 | Freq: Once | INTRAVENOUS | Status: AC
  Administered 2021-05-30: 102 mg via INTRAVENOUS
  Filled 2021-05-30: qty 17

## 2021-05-30 MED ORDER — SODIUM CHLORIDE 0.9 % IV SOLN
10.0000 mg | Freq: Once | INTRAVENOUS | Status: AC
  Administered 2021-05-30: 10 mg via INTRAVENOUS
  Filled 2021-05-30: qty 10

## 2021-05-30 MED ORDER — FAMOTIDINE 20 MG IN NS 100 ML IVPB
20.0000 mg | Freq: Once | INTRAVENOUS | Status: AC
  Administered 2021-05-30: 20 mg via INTRAVENOUS
  Filled 2021-05-30: qty 20

## 2021-05-30 MED ORDER — SODIUM CHLORIDE 0.9 % IV SOLN
Freq: Once | INTRAVENOUS | Status: AC
Start: 2021-05-30 — End: 2021-05-30
  Filled 2021-05-30: qty 250

## 2021-05-30 MED ORDER — HEPARIN SOD (PORK) LOCK FLUSH 100 UNIT/ML IV SOLN
INTRAVENOUS | Status: AC
  Filled 2021-05-30: qty 5

## 2021-05-30 MED ORDER — SODIUM CHLORIDE 0.9 % IV SOLN
195.2000 mg | Freq: Once | INTRAVENOUS | Status: AC
  Administered 2021-05-30: 200 mg via INTRAVENOUS
  Filled 2021-05-30: qty 20

## 2021-05-30 MED ORDER — DIPHENHYDRAMINE HCL 50 MG/ML IJ SOLN
50.0000 mg | Freq: Once | INTRAMUSCULAR | Status: AC
  Administered 2021-05-30: 50 mg via INTRAVENOUS
  Filled 2021-05-30: qty 1

## 2021-05-30 MED ORDER — PALONOSETRON HCL INJECTION 0.25 MG/5ML
0.2500 mg | Freq: Once | INTRAVENOUS | Status: AC
  Administered 2021-05-30: 0.25 mg via INTRAVENOUS
  Filled 2021-05-30: qty 5

## 2021-05-30 NOTE — Progress Notes (Signed)
Patient here for oncology follow-up appointment, expresses concerns of sleeping and mouth discomfort

## 2021-05-30 NOTE — Progress Notes (Signed)
Hematology Oncology Progress Note   Clinic Day:  05/30/2021  Referring physician: Derinda Late, MD  Chief Complaint: Sean Garner. is a 75 y.o. 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  PERTINENT ONCOLOGY HISTORY Patient previously followed up by Dr.Corcoran, patient switched care to me on 05/30/21 Extensive medical record review was performed by me  Clinical stage I (T1N1Mx) right base of tongue carcinoma s/p ultrasound guided right cervical lymph node biopsy on 10/11/2020.  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    09/28/2020 Neck soft tissue CT on  revealed a 3.5 x 3.0 x 4.8 cm right neck mass most c/w enlarged lymph node. There was a 14 mm enhancing mass in the right base of tongue.   PET scan on 10/25/2020 revealed right tongue base primary with ipsilateral level 2 nodal metastasis. There were no findings of extracervical metastatic disease. There was focal hypermetabolism in the distal esophagus, with possible concurrent soft tissue density lesion. Recommended correlation with endoscopy. There was vague right lower lobe low-level hypermetabolism and ground-glass nodularity, favored minimal infection or aspiration. Incidental findings included aortic atherosclerosis, coronary artery atherosclerosis, emphysema, a tiny hiatal hernia, and prostatomegaly. 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  Speech discrimination scores were 100% in each ear.  11/03/2020 Upper endoscopy on  revealed a malignant esophageal tumor at the gastroesophageal junction. There were esophageal mucosal changes c/w short-segment Barrett's esophagus. There was a 2 cm hiatal hernia. There was gastritis.  Pathology in the esophagus at 36 cm revealed intramucosal adenocarcinoma arising in a background of high grade dysplasia  and intestinal metaplasia; pathology at 38 cm revealed adenocarcinoma.  11/15/2020 EUS on  revealed early stage adenocarcinoma arising from Barrett's esophagus.  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   11/20/2020 - 12/27/2020  6 weeks of cisplatin with concurrent radiation  completed radiation on 01/11/2021.  01/19/2021 Chest abdomen and pelvis CT with contrast on  revealed extensive nearly occlusive pulmonary thromboembolic disease bilaterally with CT evidence of right heart strain c/w at least submassive (intermediate risk) PE. There was no evidence of metastatic disease within the chest, abdomen or pelvis.  Improvement in previously demonstrated distal esophageal wall thickening without apparent focal mass lesion. There was aortic atherosclerosis and emphysema. 01/20/2021 Bilateral lower extremity duplex on  revealed occlusive DVT in the right deep femoral vein. Started on anticoagulation with heparin drip and discharged off Eliquis. Prothrombin gene mutation and factor 5 leiden were both negative  He has PTSD and clastrophobia.  Anxiety was relieved by alprazolam prior to radiation.  He is on citalopram 30 mg a day and trazodone at night.  He was admitted to [facility] from 01/19/2021 - 01/22/2021 with bilateral pulmonary emboli. Bilateral lower extremity venous duplex was positive for occlusive DVT in the right deep femoral vein. He was placed on  and discharged on Eliquis.   # Patient canceled her PET scan that was ordered for assessment of treatment response for head and neck cancer. 04/09/2021, patient was seen by Dr. Elenor Quinones and underwent EGD. Polypoid tumor was seen at the GE junction, consistent with the location of the previously visualized tumor.  With no significant difference compared to photos from other endoscopies.  The proximal esophagus is involved.  Esophagectomy will be applicable. Biopsy was taken at 38 cm.  Dr. Elenor Quinones has  reached out  radiation oncology Dr. Baruch Gouty and recommend neoadjuvant chemo and radiation.  INTERVAL HISTORY Sean Garner. is a 75 y.o. 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 Currently on concurrent chemotherapy radiation for esophageal cancer treatments.  Today he reports feeling well. He is here by himself.  no dysphagia, unintentional weight loss.   Past Medical History:  Diagnosis Date   Anxiety    Cancer ([place])    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   Claustrophobia    Diabetes mellitus without complication ([place])    Esophageal adenocarcinoma ([place]) 04/21/2021   GERD (gastroesophageal reflux disease)    Glaucoma    using gtts for this.   Hypercholesterolemia    Hypertension     Past Surgical History:  Procedure Laterality Date   CATARACT EXTRACTION W/PHACO Left 12/03/2017   Procedure: CATARACT EXTRACTION PHACO AND INTRAOCULAR LENS PLACEMENT ([place]) COMPLICATED DIABETIC LEFT;  Surgeon: Leandrew Koyanagi, MD;  Location: [place];  Service: Ophthalmology;  Laterality: Left;  Diabetic - oral meds   CHOLECYSTECTOMY     COLONOSCOPY  2016   PORTA CATH INSERTION N/A 11/13/2020   Procedure: PORTA CATH INSERTION;  Surgeon: Algernon Huxley, MD;  Location: [facility];  Service: Cardiovascular;  Laterality: N/A;    Family History  Problem Relation Age of Onset   Cancer Mother    Colon cancer Neg Hx    Colon polyps Neg Hx    Esophageal cancer Neg Hx    Rectal cancer Neg Hx    Stomach cancer Neg Hx     Social History:  reports that he quit smoking about 36 years ago. He has never used smokeless tobacco. He reports previous alcohol use. He reports that he does not use drugs. He quit smoking cold [place] in 1986. He smoked 2 packs per day for 20  years. He drinks a glass of wine or a Margarita occasionally. 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 He was exposed to [employer]. He worked with a Geologist, engineering for 34 years. The patient is accompanied by his wife today.  Allergies: No Known Allergies  Current Medications: Current Outpatient Medications  Medication Sig Dispense Refill   APIXABAN (ELIQUIS) VTE STARTER PACK (10MG  AND 5MG ) Take as directed on package: start with two-5mg  tablets twice daily for 7 days. On day 8 (01/29/2021), switch to one-5mg  tablet twice daily. 1 tablet 0   brimonidine-timolol (COMBIGAN) 0.2-0.5 % ophthalmic solution Place 1 drop into the left eye daily.     calcium carbonate (OS-CAL) 600 MG TABS tablet Take by mouth.     citalopram (CELEXA) 20 MG tablet Take 1 tablet (20 mg total) by mouth daily. 30 tablet 1   diphenhydrAMINE HCl (BENADRYL ALLERGY PO) Take by mouth.     fluticasone (FLONASE) 50 MCG/ACT nasal spray Place into both nostrils.     glipiZIDE (GLUCOTROL XL) 5 MG 24 hr tablet Take 5 mg by mouth 2 (two) times daily.  1   glucose blood test strip      Magnesium Oxide (MAG-OXIDE) 200 MG TABS Take 400 mg by mouth daily.     Melatonin 3 MG CAPS Take 5 mg by mouth.  metFORMIN (GLUCOPHAGE-XR) 500 MG 24 hr tablet Take 500 mg by mouth 2 (two) times daily. Takes 2 tablets and 1 tablet in the pm     pantoprazole (PROTONIX) 40 MG tablet Take by mouth.     simvastatin (ZOCOR) 40 MG tablet Take 40 mg by mouth daily at 6 PM.     vitamin B-12 (CYANOCOBALAMIN) 1000 MCG tablet Take 1,000 mcg by mouth daily.     VITAMIN D PO Take 800 Units by mouth daily.     ALPRAZolam (XANAX) 0.25 MG tablet Take 1 tablet (0.25 mg total) by mouth at bedtime as needed for anxiety. (Patient not taking: No sig reported) 60 tablet 0   magic mouthwash w/lidocaine SOLN Take 5 mLs by mouth. (Patient not taking: No sig reported)     sucralfate (CARAFATE) 1 g tablet Take 0.5 tablets (0.5 g total) by mouth 3 (three) times  daily. Dissolve in warm water, swish and swallow (Patient not taking: No sig reported) 60 tablet 1   traZODone (DESYREL) 50 MG tablet Take 0.5-1 tablets (25-50 mg total) by mouth at bedtime as needed for sleep. (Patient not taking: No sig reported) 30 tablet 2   Current Facility-Administered Medications  Medication Dose Route Frequency Provider Last Rate Last Admin   0.9 %  sodium chloride infusion  500 mL Intravenous Once Pyrtle, Lajuan Lines, MD       Facility-Administered Medications Ordered in Other Visits  Medication Dose Route Frequency Provider Last Rate Last Admin   0.9 % NaCl with KCl 20 mEq/ L  infusion   Intravenous Once Corcoran, Melissa C, MD       magnesium sulfate IVPB 2 g 50 mL  2 g Intravenous Once Lequita Asal, MD        Review of Systems  Constitutional: Negative.  Negative for chills, fever, malaise/fatigue and weight loss.  HENT:  Negative for congestion, ear pain and tinnitus.   Eyes: Negative.  Negative for blurred vision and double vision.  Respiratory: Negative.  Negative for cough, sputum production and shortness of breath.   Cardiovascular:  Positive for leg swelling (Right leg ). Negative for chest pain and palpitations.  Gastrointestinal: Negative.  Negative for abdominal pain, constipation, diarrhea, nausea and vomiting.  Genitourinary:  Negative for dysuria, frequency and urgency.  Musculoskeletal:  Negative for back pain and falls.  Skin: Negative.  Negative for rash.  Neurological: Negative.  Negative for weakness and headaches.  Endo/Heme/Allergies: Negative.  Does not bruise/bleed easily.  Psychiatric/Behavioral: Negative.  Negative for depression. The patient is not nervous/anxious and does not have insomnia.   Performance status (ECOG): 1  Vitals Blood pressure 139/76, pulse 70, temperature (!) 97.4 F (36.3 C), temperature source Tympanic, weight 174 lb (78.9 kg), SpO2 100 %.   Physical Exam Vitals and nursing note reviewed.  Constitutional:       General: He is not in acute distress.    Appearance: He is not diaphoretic.  HENT:     Head: Normocephalic and atraumatic.     Mouth/Throat:     Mouth: Mucous membranes are moist.     Pharynx: Oropharynx is clear.  Eyes:     General: No scleral icterus.    Extraocular Movements: Extraocular movements intact.     Conjunctiva/sclera: Conjunctivae normal.     Pupils: Pupils are equal, round, and reactive to light.  Cardiovascular:     Rate and Rhythm: Normal rate and regular rhythm.     Heart sounds: Normal heart sounds. No murmur  heard. Pulmonary:     Effort: Pulmonary effort is normal. No respiratory distress.     Breath sounds: Normal breath sounds. No wheezing or rales.  Chest:     Chest wall: No tenderness.  Breasts:    Right: No axillary adenopathy or supraclavicular adenopathy.     Left: No axillary adenopathy or supraclavicular adenopathy.  Abdominal:     General: Bowel sounds are normal. There is no distension.     Palpations: Abdomen is soft. There is no mass.     Tenderness: There is no abdominal tenderness. There is no guarding or rebound.  Musculoskeletal:        General: No swelling or tenderness. Normal range of motion.     Cervical back: Normal range of motion and neck supple.     Right lower leg: Swelling present.     Right ankle: Swelling present.  Lymphadenopathy:     Head:     Right side of head: No preauricular, posterior auricular or occipital adenopathy.     Left side of head: No preauricular, posterior auricular or occipital adenopathy.     Upper Body:     Right upper body: No supraclavicular or axillary adenopathy.     Left upper body: No supraclavicular or axillary adenopathy.     Lower Body: No right inguinal adenopathy. No left inguinal adenopathy.  Skin:    General: Skin is warm and dry.     Comments: Tan around neck s/p radiation. Scar on right side of neck, fingertip sized.  Neurological:     Mental Status: He is alert and oriented to person,  place, and time.     Comments: Left drop foot, improved.  Psychiatric:        Mood and Affect: Mood normal.   Infusion on 05/30/2021  Component Date Value Ref Range Status   Sodium 05/30/2021 134 (A) 135 - 145 mmol/L Final   Potassium 05/30/2021 4.0  3.5 - 5.1 mmol/L Final   Chloride 05/30/2021 99  98 - 111 mmol/L Final   CO2 05/30/2021 25  22 - 32 mmol/L Final   Glucose, Bld 05/30/2021 258 (A) 70 - 99 mg/dL Final   Glucose reference range applies only to samples taken after fasting for at least 8 hours.   BUN 05/30/2021 20  8 - 23 mg/dL Final   Creatinine, Ser 05/30/2021 0.89  0.61 - 1.24 mg/dL Final   Calcium 05/30/2021 9.3  8.9 - 10.3 mg/dL Final   Total Protein 05/30/2021 6.8  6.5 - 8.1 g/dL Final   Albumin 05/30/2021 4.2  3.5 - 5.0 g/dL Final   AST 05/30/2021 21  15 - 41 U/L Final   ALT 05/30/2021 20  0 - 44 U/L Final   Alkaline Phosphatase 05/30/2021 47  38 - 126 U/L Final   Total Bilirubin 05/30/2021 0.6  0.3 - 1.2 mg/dL Final   GFR, Estimated 05/30/2021 >60  >60 mL/min Final   Comment: (NOTE) Calculated using the CKD-EPI Creatinine Equation (2021)    Anion gap 05/30/2021 10  5 - 15 Final   Performed at [facility], [address] [place], [place] [address]   WBC 05/30/2021 5.6  4.0 - 10.5 K/uL Final   RBC 05/30/2021 4.19 (A) 4.22 - 5.81 MIL/uL Final   Hemoglobin 05/30/2021 12.8 (A) 13.0 - 17.0 g/dL Final   HCT 05/30/2021 36.6 (A) 39.0 - 52.0 % Final   MCV 05/30/2021 87.4  80.0 - 100.0 fL Final   MCH 05/30/2021 30.5  26.0 -  34.0 pg Final   MCHC 05/30/2021 35.0  30.0 - 36.0 g/dL Final   RDW 05/30/2021 12.3  11.5 - 15.5 % Final   Platelets 05/30/2021 201  150 - 400 K/uL Final   nRBC 05/30/2021 0.0  0.0 - 0.2 % Final   Neutrophils Relative % 05/30/2021 86  % Final   Neutro Abs 05/30/2021 4.8  1.7 - 7.7 K/uL Final   Lymphocytes Relative 05/30/2021 8  % Final   Lymphs Abs 05/30/2021 0.4 (A) 0.7 - 4.0 K/uL Final   Monocytes Relative 05/30/2021 4  % Final    Monocytes Absolute 05/30/2021 0.2  0.1 - 1.0 K/uL Final   Eosinophils Relative 05/30/2021 1  % Final   Eosinophils Absolute 05/30/2021 0.0  0.0 - 0.5 K/uL Final   Basophils Relative 05/30/2021 0  % Final   Basophils Absolute 05/30/2021 0.0  0.0 - 0.1 K/uL Final   Immature Granulocytes 05/30/2021 1  % Final   Abs Immature Granulocytes 05/30/2021 0.06  0.00 - 0.07 K/uL Final   Performed at [facility], [address], [place], [place] [address]    Assessment/Plan.  1. Esophageal adenocarcinoma ([place])   2. Carcinoma of base of tongue ([place])   3. Pulmonary embolism, bilateral ([place])   4. Encounter for antineoplastic chemotherapy     1. Clinical stage I right base of tongue carcinoma, Stage I, P16 positive.  He completed concurrent radiation and cisplatin on 01/11/2021. Patient declined PET scan and is reluctant to reschedule. 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 Proceed with weekly dose of carboplatin and Taxol today.  3   Bilateral pulmonary emboli and RLE DVT - 01/2021 Continue Eliquis 5 mg twice daily  I discussed the assessment and treatment plan with the patient.  The patient was provided an opportunity to ask questions and all were answered.  The patient agreed with the plan and demonstrated an understanding of the instructions.  The patient was advised to call back if the symptoms worsen or if the condition fails to improve as anticipated.  Follow up in 1 week for lab md carboplatin and taxol.    Earlie Server, MD, PhD Hematology Oncology [place] at [facility] 05/30/2021

## 2021-05-30 NOTE — Patient Instructions (Signed)
[facility] [place] [facility]  Discharge Instructions: Thank you for choosing [facility] to provide your oncology and hematology care.  If you have a lab appointment with the Cancer Center, please go directly to the Cancer Center and check in at the registration area.  Wear comfortable clothing and clothing appropriate for easy access to any Portacath or PICC line.   We strive to give you quality time with your provider. You may need to reschedule your appointment if you arrive late (15 or more minutes).  Arriving late affects you and other patients whose appointments are after yours.  Also, if you miss three or more appointments without notifying the office, you may be dismissed from the clinic at the provider's discretion.      For prescription refill requests, have your pharmacy contact our office and allow 72 hours for refills to be completed.    Today you received the following chemotherapy and/or immunotherapy agents Taxol & Carboplatin      To help prevent nausea and vomiting after your treatment, we encourage you to take your nausea medication as directed.  BELOW ARE SYMPTOMS THAT SHOULD BE REPORTED IMMEDIATELY: *FEVER GREATER THAN 100.4 F (38 C) OR HIGHER *CHILLS OR SWEATING *NAUSEA AND VOMITING THAT IS NOT CONTROLLED WITH YOUR NAUSEA MEDICATION *UNUSUAL SHORTNESS OF BREATH *UNUSUAL BRUISING OR BLEEDING *URINARY PROBLEMS (pain or burning when urinating, or frequent urination) *BOWEL PROBLEMS (unusual diarrhea, constipation, pain near the anus) TENDERNESS IN MOUTH AND THROAT WITH OR WITHOUT PRESENCE OF ULCERS (sore throat, sores in mouth, or a toothache) UNUSUAL RASH, SWELLING OR PAIN  UNUSUAL VAGINAL DISCHARGE OR ITCHING   Items with * indicate a potential emergency and should be followed up as soon as possible or go to the Emergency Department if any problems should occur.  Please show the CHEMOTHERAPY ALERT CARD or IMMUNOTHERAPY ALERT CARD at  check-in to the Emergency Department and triage nurse.  Should you have questions after your visit or need to cancel or reschedule your appointment, please contact [facility]  [phone] and follow the prompts.  Office hours are 8:00 a.m. to 4:30 p.m. Monday - Friday. Please note that voicemails left after 4:00 p.m. may not be returned until the following business day.  We are closed weekends and major holidays. You have access to a nurse at all times for urgent questions. Please call the main number to the clinic [phone] and follow the prompts.  For any non-urgent questions, you may also contact your provider using MyChart. We now offer e-Visits for anyone 18 and older to request care online for non-urgent symptoms. For details visit [email or web address].   Also download the MyChart app! Go to the app store, search "MyChart", open the app, select [facility], and log in with your MyChart username and password.  Due to Covid, a mask is required upon entering the hospital/clinic. If you do not have a mask, one will be given to you upon arrival. For doctor visits, patients may have 1 support person aged 18 or older with them. For treatment visits, patients cannot have anyone with them due to current Covid guidelines and our immunocompromised population.  

## 2021-05-31 ENCOUNTER — Ambulatory Visit
Admission: RE | Admit: 2021-05-31 | Discharge: 2021-05-31 | Disposition: A | Discharge status: Home / Self Care | Payer: No Typology Code available for payment source | Source: Ambulatory Visit | Attending: Radiation Oncology | Admitting: Radiation Oncology

## 2021-05-31 ENCOUNTER — Ambulatory Visit: Payer: No Typology Code available for payment source | Admitting: Radiation Oncology

## 2021-05-31 DIAGNOSIS — C01 Malignant neoplasm of base of tongue: Secondary | ICD-10-CM | POA: Diagnosis not present

## 2021-06-01 ENCOUNTER — Ambulatory Visit
Admission: RE | Admit: 2021-06-01 | Discharge: 2021-06-01 | Disposition: A | Discharge status: Home / Self Care | Payer: No Typology Code available for payment source | Source: Ambulatory Visit | Attending: Radiation Oncology | Admitting: Radiation Oncology

## 2021-06-01 DIAGNOSIS — C01 Malignant neoplasm of base of tongue: Secondary | ICD-10-CM | POA: Diagnosis not present

## 2021-06-04 ENCOUNTER — Ambulatory Visit
Admission: RE | Admit: 2021-06-04 | Discharge: 2021-06-04 | Disposition: A | Discharge status: Home / Self Care | Payer: No Typology Code available for payment source | Source: Ambulatory Visit | Attending: Radiation Oncology | Admitting: Radiation Oncology

## 2021-06-04 DIAGNOSIS — C01 Malignant neoplasm of base of tongue: Secondary | ICD-10-CM | POA: Diagnosis not present

## 2021-06-05 ENCOUNTER — Ambulatory Visit
Admission: RE | Admit: 2021-06-05 | Discharge: 2021-06-05 | Disposition: A | Discharge status: Home / Self Care | Payer: No Typology Code available for payment source | Source: Ambulatory Visit | Attending: Radiation Oncology | Admitting: Radiation Oncology

## 2021-06-05 DIAGNOSIS — C01 Malignant neoplasm of base of tongue: Secondary | ICD-10-CM | POA: Diagnosis not present

## 2021-06-06 ENCOUNTER — Inpatient Hospital Stay: Payer: No Typology Code available for payment source

## 2021-06-06 ENCOUNTER — Inpatient Hospital Stay (HOSPITAL_BASED_OUTPATIENT_CLINIC_OR_DEPARTMENT_OTHER): Payer: No Typology Code available for payment source | Admitting: Oncology

## 2021-06-06 ENCOUNTER — Ambulatory Visit
Admission: RE | Admit: 2021-06-06 | Discharge: 2021-06-06 | Disposition: A | Discharge status: Home / Self Care | Payer: No Typology Code available for payment source | Source: Ambulatory Visit | Attending: Radiation Oncology | Admitting: Radiation Oncology

## 2021-06-06 ENCOUNTER — Encounter: Payer: Self-pay | Admitting: Oncology

## 2021-06-06 VITALS — BP 116/68 | HR 68 | Temp 97.0°F | Resp 18 | Wt 172.4 lb

## 2021-06-06 DIAGNOSIS — I2699 Other pulmonary embolism without acute cor pulmonale: Secondary | ICD-10-CM | POA: Diagnosis not present

## 2021-06-06 DIAGNOSIS — C01 Malignant neoplasm of base of tongue: Secondary | ICD-10-CM | POA: Diagnosis not present

## 2021-06-06 DIAGNOSIS — C159 Malignant neoplasm of esophagus, unspecified: Secondary | ICD-10-CM

## 2021-06-06 DIAGNOSIS — Z5111 Encounter for antineoplastic chemotherapy: Secondary | ICD-10-CM

## 2021-06-06 LAB — CBC WITH DIFFERENTIAL/PLATELET
Abs Immature Granulocytes: 0.04 10*3/uL (ref 0.00–0.07)
Basophils Absolute: 0 10*3/uL (ref 0.0–0.1)
Basophils Relative: 0 %
Eosinophils Absolute: 0 10*3/uL (ref 0.0–0.5)
Eosinophils Relative: 1 %
HCT: 34.9 % — ABNORMAL LOW (ref 39.0–52.0)
Hemoglobin: 11.9 g/dL — ABNORMAL LOW (ref 13.0–17.0)
Immature Granulocytes: 1 %
Lymphocytes Relative: 9 %
Lymphs Abs: 0.3 10*3/uL — ABNORMAL LOW (ref 0.7–4.0)
MCH: 29.8 pg (ref 26.0–34.0)
MCHC: 34.1 g/dL (ref 30.0–36.0)
MCV: 87.5 fL (ref 80.0–100.0)
Monocytes Absolute: 0.2 10*3/uL (ref 0.1–1.0)
Monocytes Relative: 6 %
Neutro Abs: 2.9 10*3/uL (ref 1.7–7.7)
Neutrophils Relative %: 83 %
Platelets: 199 10*3/uL (ref 150–400)
RBC: 3.99 MIL/uL — ABNORMAL LOW (ref 4.22–5.81)
RDW: 12.7 % (ref 11.5–15.5)
WBC: 3.5 10*3/uL — ABNORMAL LOW (ref 4.0–10.5)
nRBC: 0 % (ref 0.0–0.2)

## 2021-06-06 LAB — COMPREHENSIVE METABOLIC PANEL
ALT: 17 U/L (ref 0–44)
AST: 19 U/L (ref 15–41)
Albumin: 3.9 g/dL (ref 3.5–5.0)
Alkaline Phosphatase: 38 U/L (ref 38–126)
Anion gap: 7 (ref 5–15)
BUN: 19 mg/dL (ref 8–23)
CO2: 26 mmol/L (ref 22–32)
Calcium: 8.9 mg/dL (ref 8.9–10.3)
Chloride: 100 mmol/L (ref 98–111)
Creatinine, Ser: 0.81 mg/dL (ref 0.61–1.24)
GFR, Estimated: >60 mL/min (ref >60)
Glucose, Bld: 269 mg/dL — ABNORMAL HIGH (ref 70–99)
Potassium: 4 mmol/L (ref 3.5–5.1)
Sodium: 133 mmol/L — ABNORMAL LOW (ref 135–145)
Total Bilirubin: 0.7 mg/dL (ref 0.3–1.2)
Total Protein: 6.4 g/dL — ABNORMAL LOW (ref 6.5–8.1)

## 2021-06-06 MED ORDER — DIPHENHYDRAMINE HCL 50 MG/ML IJ SOLN
50.0000 mg | Freq: Once | INTRAMUSCULAR | Status: AC
  Administered 2021-06-06: 50 mg via INTRAVENOUS
  Filled 2021-06-06: qty 1

## 2021-06-06 MED ORDER — SODIUM CHLORIDE 0.9 % IV SOLN
195.2000 mg | Freq: Once | INTRAVENOUS | Status: AC
  Administered 2021-06-06: 200 mg via INTRAVENOUS
  Filled 2021-06-06: qty 20

## 2021-06-06 MED ORDER — SODIUM CHLORIDE 0.9 % IV SOLN
Freq: Once | INTRAVENOUS | Status: AC
  Filled 2021-06-06: qty 250

## 2021-06-06 MED ORDER — SODIUM CHLORIDE 0.9 % IV SOLN
10.0000 mg | Freq: Once | INTRAVENOUS | Status: AC
  Administered 2021-06-06: 10 mg via INTRAVENOUS
  Filled 2021-06-06: qty 10

## 2021-06-06 MED ORDER — HEPARIN SOD (PORK) LOCK FLUSH 100 UNIT/ML IV SOLN
INTRAVENOUS | Status: AC
  Filled 2021-06-06: qty 5

## 2021-06-06 MED ORDER — SODIUM CHLORIDE 0.9% FLUSH
10.0000 mL | INTRAVENOUS | Status: DC | PRN
  Filled 2021-06-06: qty 10

## 2021-06-06 MED ORDER — SODIUM CHLORIDE 0.9 % IV SOLN
50.0000 mg/m2 | Freq: Once | INTRAVENOUS | Status: AC
  Administered 2021-06-06: 102 mg via INTRAVENOUS
  Filled 2021-06-06: qty 17

## 2021-06-06 MED ORDER — PALONOSETRON HCL INJECTION 0.25 MG/5ML
0.2500 mg | Freq: Once | INTRAVENOUS | Status: AC
  Administered 2021-06-06: 0.25 mg via INTRAVENOUS
  Filled 2021-06-06: qty 5

## 2021-06-06 MED ORDER — FAMOTIDINE 20 MG IN NS 100 ML IVPB
20.0000 mg | Freq: Once | INTRAVENOUS | Status: AC
  Administered 2021-06-06: 20 mg via INTRAVENOUS
  Filled 2021-06-06: qty 20

## 2021-06-06 MED ORDER — HEPARIN SOD (PORK) LOCK FLUSH 100 UNIT/ML IV SOLN
500.0000 [IU] | Freq: Once | INTRAVENOUS | Status: AC | PRN
Start: 2021-06-06 — End: 2021-06-06
  Administered 2021-06-06: 500 [IU]
  Filled 2021-06-06: qty 5

## 2021-06-06 NOTE — Patient Instructions (Signed)
[facility]  Discharge Instructions: Thank you for choosing [facility] to provide your oncology and hematology care.  If you have a lab appointment with the Cancer Center, please go directly to the Cancer Center and check in at the registration area.  Wear comfortable clothing and clothing appropriate for easy access to any Portacath or PICC line.   We strive to give you quality time with your provider. You may need to reschedule your appointment if you arrive late (15 or more minutes).  Arriving late affects you and other patients whose appointments are after yours.  Also, if you miss three or more appointments without notifying the office, you may be dismissed from the clinic at the provider's discretion.      For prescription refill requests, have your pharmacy contact our office and allow 72 hours for refills to be completed.    Today you received the following chemotherapy and/or immunotherapy agents - paclitaxel, carboplatin      To help prevent nausea and vomiting after your treatment, we encourage you to take your nausea medication as directed.  BELOW ARE SYMPTOMS THAT SHOULD BE REPORTED IMMEDIATELY: *FEVER GREATER THAN 100.4 F (38 C) OR HIGHER *CHILLS OR SWEATING *NAUSEA AND VOMITING THAT IS NOT CONTROLLED WITH YOUR NAUSEA MEDICATION *UNUSUAL SHORTNESS OF BREATH *UNUSUAL BRUISING OR BLEEDING *URINARY PROBLEMS (pain or burning when urinating, or frequent urination) *BOWEL PROBLEMS (unusual diarrhea, constipation, pain near the anus) TENDERNESS IN MOUTH AND THROAT WITH OR WITHOUT PRESENCE OF ULCERS (sore throat, sores in mouth, or a toothache) UNUSUAL RASH, SWELLING OR PAIN  UNUSUAL VAGINAL DISCHARGE OR ITCHING   Items with * indicate a potential emergency and should be followed up as soon as possible or go to the Emergency Department if any problems should occur.  Please show the CHEMOTHERAPY ALERT CARD or IMMUNOTHERAPY ALERT  CARD at check-in to the Emergency Department and triage nurse.  Should you have questions after your visit or need to cancel or reschedule your appointment, please contact [facility]  [phone] and follow the prompts.  Office hours are 8:00 a.m. to 4:30 p.m. Monday - Friday. Please note that voicemails left after 4:00 p.m. may not be returned until the following business day.  We are closed weekends and major holidays. You have access to a nurse at all times for urgent questions. Please call the main number to the clinic [phone] and follow the prompts.  For any non-urgent questions, you may also contact your provider using MyChart. We now offer e-Visits for anyone 18 and older to request care online for non-urgent symptoms. For details visit [email or web address].   Also download the MyChart app! Go to the app store, search "MyChart", open the app, select [facility], and log in with your MyChart username and password.  Due to Covid, a mask is required upon entering the hospital/clinic. If you do not have a mask, one will be given to you upon arrival. For doctor visits, patients may have 1 support person aged 18 or older with them. For treatment visits, patients cannot have anyone with them due to current Covid guidelines and our immunocompromised population.   Paclitaxel injection What is this medication? PACLITAXEL (PAK li TAX el) is a chemotherapy drug. It targets fast dividing cells, like cancer cells, and causes these cells to die. This medicine is used to treat ovarian cancer, breast cancer, lung cancer, Kaposi's sarcoma, andother cancers. This medicine may be used for other purposes;   ask your health care provider orpharmacist if you have questions. COMMON BRAND NAME(S): Onxol, Taxol What should I tell my care team before I take this medication? They need to know if you have any of these conditions: history of irregular heartbeat liver  disease low blood counts, like low white cell, platelet, or red cell counts lung or breathing disease, like asthma tingling of the fingers or toes, or other nerve disorder an unusual or allergic reaction to paclitaxel, alcohol, polyoxyethylated castor oil, other chemotherapy, other medicines, foods, dyes, or preservatives pregnant or trying to get pregnant breast-feeding How should I use this medication? This drug is given as an infusion into a vein. It is administered in a hospitalor clinic by a specially trained health care professional. Talk to your pediatrician regarding the use of this medicine in children.Special care may be needed. Overdosage: If you think you have taken too much of this medicine contact apoison control center or emergency room at once. NOTE: This medicine is only for you. Do not share this medicine with others. What if I miss a dose? It is important not to miss your dose. Call your doctor or health careprofessional if you are unable to keep an appointment. What may interact with this medication? Do not take this medicine with any of the following medications: live virus vaccines This medicine may also interact with the following medications: antiviral medicines for hepatitis, HIV or AIDS certain antibiotics like erythromycin and clarithromycin certain medicines for fungal infections like ketoconazole and itraconazole certain medicines for seizures like carbamazepine, phenobarbital, phenytoin gemfibrozil nefazodone rifampin [place]'s wort This list may not describe all possible interactions. Give your health care provider a list of all the medicines, herbs, non-prescription drugs, or dietary supplements you use. Also tell them if you smoke, drink alcohol, or use illegaldrugs. Some items may interact with your medicine. What should I watch for while using this medication? Your condition will be monitored carefully while you are receiving this medicine. You will  need important blood work done while you are taking thismedicine. This medicine can cause serious allergic reactions. To reduce your risk you will need to take other medicine(s) before treatment with this medicine. If you experience allergic reactions like skin rash, itching or hives, swelling of theface, lips, or tongue, tell your doctor or health care professional right away. In some cases, you may be given additional medicines to help with side effects.Follow all directions for their use. This drug may make you feel generally unwell. This is not uncommon, as chemotherapy can affect healthy cells as well as cancer cells. Report any side effects. Continue your course of treatment even though you feel ill unless yourdoctor tells you to stop. Call your doctor or health care professional for advice if you get a fever, chills or sore throat, or other symptoms of a cold or flu. Do not treat yourself. This drug decreases your body's ability to fight infections. Try toavoid being around people who are sick. This medicine may increase your risk to bruise or bleed. Call your doctor orhealth care professional if you notice any unusual bleeding. Be careful brushing and flossing your teeth or using a toothpick because you may get an infection or bleed more easily. If you have any dental work done,tell your dentist you are receiving this medicine. Avoid taking products that contain aspirin, acetaminophen, ibuprofen, naproxen, or ketoprofen unless instructed by your doctor. These medicines may hide afever. Do not become pregnant while taking this medicine. Women should inform their   doctor if they wish to become pregnant or think they might be pregnant. There is a potential for serious side effects to an unborn child. Talk to your health care professional or pharmacist for more information. Do not breast-feed aninfant while taking this medicine. Men are advised not to father a child while receiving this medicine. This  product may contain alcohol. Ask your pharmacist or healthcare provider if this medicine contains alcohol. Be sure to tell all healthcare providers you are taking this medicine. Certain medicines, like metronidazole and disulfiram, can cause an unpleasant reaction when taken with alcohol. The reaction includes flushing, headache, nausea, vomiting, sweating, and increased thirst. Thereaction can last from 30 minutes to several hours. What side effects may I notice from receiving this medication? Side effects that you should report to your doctor or health care professionalas soon as possible: allergic reactions like skin rash, itching or hives, swelling of the face, lips, or tongue breathing problems changes in vision fast, irregular heartbeat high or low blood pressure mouth sores pain, tingling, numbness in the hands or feet signs of decreased platelets or bleeding - bruising, pinpoint red spots on the skin, black, tarry stools, blood in the urine signs of decreased red blood cells - unusually weak or tired, feeling faint or lightheaded, falls signs of infection - fever or chills, cough, sore throat, pain or difficulty passing urine signs and symptoms of liver injury like dark yellow or brown urine; general ill feeling or flu-like symptoms; light-colored stools; loss of appetite; nausea; right upper belly pain; unusually weak or tired; yellowing of the eyes or skin swelling of the ankles, feet, hands unusually slow heartbeat Side effects that usually do not require medical attention (report to yourdoctor or health care professional if they continue or are bothersome): diarrhea hair loss loss of appetite muscle or joint pain nausea, vomiting pain, redness, or irritation at site where injected tiredness This list may not describe all possible side effects. Call your doctor for medical advice about side effects. You may report side effects to FDA at1-800-FDA-1088. Where should I keep my  medication? This drug is given in a hospital or clinic and will not be stored at home. NOTE: This sheet is a summary. It may not cover all possible information. If you have questions about this medicine, talk to your doctor, pharmacist, orhealth care provider.  2022 Elsevier/Gold Standard (2019-10-06 13:37:23)  Carboplatin injection What is this medication? CARBOPLATIN (KAR boe pla tin) is a chemotherapy drug. It targets fast dividing cells, like cancer cells, and causes these cells to die. This medicine is usedto treat ovarian cancer and many other cancers. This medicine may be used for other purposes; ask your health care provider orpharmacist if you have questions. COMMON BRAND NAME(S): Paraplatin What should I tell my care team before I take this medication? They need to know if you have any of these conditions: blood disorders hearing problems kidney disease recent or ongoing radiation therapy an unusual or allergic reaction to carboplatin, cisplatin, other chemotherapy, other medicines, foods, dyes, or preservatives pregnant or trying to get pregnant breast-feeding How should I use this medication? This drug is usually given as an infusion into a vein. It is administered in ahospital or clinic by a specially trained health care professional. Talk to your pediatrician regarding the use of this medicine in children.Special care may be needed. Overdosage: If you think you have taken too much of this medicine contact apoison control center or emergency room at once. NOTE: This medicine   is only for you. Do not share this medicine with others. What if I miss a dose? It is important not to miss a dose. Call your doctor or health careprofessional if you are unable to keep an appointment. What may interact with this medication? medicines for seizures medicines to increase blood counts like filgrastim, pegfilgrastim, sargramostim some antibiotics like amikacin, gentamicin, neomycin,  streptomycin, tobramycin vaccines Talk to your doctor or health care professional before taking any of thesemedicines: acetaminophen aspirin ibuprofen ketoprofen naproxen This list may not describe all possible interactions. Give your health care provider a list of all the medicines, herbs, non-prescription drugs, or dietary supplements you use. Also tell them if you smoke, drink alcohol, or use illegaldrugs. Some items may interact with your medicine. What should I watch for while using this medication? Your condition will be monitored carefully while you are receiving this medicine. You will need important blood work done while you are taking thismedicine. This drug may make you feel generally unwell. This is not uncommon, as chemotherapy can affect healthy cells as well as cancer cells. Report any side effects. Continue your course of treatment even though you feel ill unless yourdoctor tells you to stop. In some cases, you may be given additional medicines to help with side effects.Follow all directions for their use. Call your doctor or health care professional for advice if you get a fever, chills or sore throat, or other symptoms of a cold or flu. Do not treat yourself. This drug decreases your body's ability to fight infections. Try toavoid being around people who are sick. This medicine may increase your risk to bruise or bleed. Call your doctor orhealth care professional if you notice any unusual bleeding. Be careful brushing and flossing your teeth or using a toothpick because you may get an infection or bleed more easily. If you have any dental work done,tell your dentist you are receiving this medicine. Avoid taking products that contain aspirin, acetaminophen, ibuprofen, naproxen, or ketoprofen unless instructed by your doctor. These medicines may hide afever. Do not become pregnant while taking this medicine. Women should inform their doctor if they wish to become pregnant or think  they might be pregnant. There is a potential for serious side effects to an unborn child. Talk to your health care professional or pharmacist for more information. Do not breast-feed aninfant while taking this medicine. What side effects may I notice from receiving this medication? Side effects that you should report to your doctor or health care professionalas soon as possible: allergic reactions like skin rash, itching or hives, swelling of the face, lips, or tongue signs of infection - fever or chills, cough, sore throat, pain or difficulty passing urine signs of decreased platelets or bleeding - bruising, pinpoint red spots on the skin, black, tarry stools, nosebleeds signs of decreased red blood cells - unusually weak or tired, fainting spells, lightheadedness breathing problems changes in hearing changes in vision chest pain high blood pressure low blood counts - This drug may decrease the number of white blood cells, red blood cells and platelets. You may be at increased risk for infections and bleeding. nausea and vomiting pain, swelling, redness or irritation at the injection site pain, tingling, numbness in the hands or feet problems with balance, talking, walking trouble passing urine or change in the amount of urine Side effects that usually do not require medical attention (report to yourdoctor or health care professional if they continue or are bothersome): hair loss loss of appetite metallic   taste in the mouth or changes in taste This list may not describe all possible side effects. Call your doctor for medical advice about side effects. You may report side effects to FDA at1-800-FDA-1088. Where should I keep my medication? This drug is given in a hospital or clinic and will not be stored at home. NOTE: This sheet is a summary. It may not cover all possible information. If you have questions about this medicine, talk to your doctor, pharmacist, orhealth care provider.  2022  Elsevier/Gold Standard (2008-02-09 14:38:05)  

## 2021-06-06 NOTE — Progress Notes (Signed)
Hematology Oncology Progress Note   Clinic Day:  06/06/2021  Referring physician: Derinda Late, MD  Chief Complaint: Sean Garner. is a 75 y.o. 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  PERTINENT ONCOLOGY HISTORY Patient previously followed up by Dr.Corcoran, patient switched care to me on 06/06/21 Extensive medical record review was performed by me  Clinical stage I (T1N1Mx) right base of tongue carcinoma s/p ultrasound guided right cervical lymph node biopsy on 10/11/2020.  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    09/28/2020 Neck soft tissue CT on  revealed a 3.5 x 3.0 x 4.8 cm right neck mass most c/w enlarged lymph node. There was a 14 mm enhancing mass in the right base of tongue.   PET scan on 10/25/2020 revealed right tongue base primary with ipsilateral level 2 nodal metastasis. There were no findings of extracervical metastatic disease. There was focal hypermetabolism in the distal esophagus, with possible concurrent soft tissue density lesion. Recommended correlation with endoscopy. There was vague right lower lobe low-level hypermetabolism and ground-glass nodularity, favored minimal infection or aspiration. Incidental findings included aortic atherosclerosis, coronary artery atherosclerosis, emphysema, a tiny hiatal hernia, and prostatomegaly. 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  Speech discrimination scores were 100% in each ear.  11/03/2020 Upper endoscopy on  revealed a malignant esophageal tumor at the gastroesophageal junction. There were esophageal mucosal changes c/w short-segment Barrett's esophagus. There was a 2 cm hiatal hernia. There was gastritis.  Pathology in the esophagus at 36 cm revealed intramucosal adenocarcinoma arising in a background of high grade dysplasia  and intestinal metaplasia; pathology at 38 cm revealed adenocarcinoma.  11/15/2020 EUS on  revealed early stage adenocarcinoma arising from Barrett's esophagus.  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   11/20/2020 - 12/27/2020  6 weeks of cisplatin with concurrent radiation  completed radiation on 01/11/2021.  01/19/2021 Chest abdomen and pelvis CT with contrast on  revealed extensive nearly occlusive pulmonary thromboembolic disease bilaterally with CT evidence of right heart strain c/w at least submassive (intermediate risk) PE. There was no evidence of metastatic disease within the chest, abdomen or pelvis.  Improvement in previously demonstrated distal esophageal wall thickening without apparent focal mass lesion. There was aortic atherosclerosis and emphysema. 01/20/2021 Bilateral lower extremity duplex on  revealed occlusive DVT in the right deep femoral vein. Started on anticoagulation with heparin drip and discharged off Eliquis. Prothrombin gene mutation and factor 5 leiden were both negative  He has PTSD and clastrophobia.  Anxiety was relieved by alprazolam prior to radiation.  He is on citalopram 30 mg a day and trazodone at night.  He was admitted to [facility] from 01/19/2021 - 01/22/2021 with bilateral pulmonary emboli. Bilateral lower extremity venous duplex was positive for occlusive DVT in the right deep femoral vein. He was placed on  and discharged on Eliquis.   # Patient canceled her PET scan that was ordered for assessment of treatment response for head and neck cancer. 04/09/2021, patient was seen by Dr. Elenor Quinones and underwent EGD. Polypoid tumor was seen at the GE junction, consistent with the location of the previously visualized tumor.  With no significant difference compared to photos from other endoscopies.  The proximal esophagus is involved.  Esophagectomy will be applicable. Biopsy was taken at 38 cm.  Dr. Elenor Quinones has  reached out  radiation oncology Dr. Baruch Gouty and recommend neoadjuvant chemo and radiation.  INTERVAL HISTORY Sean Garner. is a 75 y.o. 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 Currently on concurrent chemotherapy radiation for esophageal cancer treatments.  He is here by himself.  Patient reports feeling well.  No new complaints.  Denies any dysphagia or odynophagia, nausea vomiting diarrhea, fever or chills.  Past Medical History:  Diagnosis Date   Anxiety    Cancer ([place])    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   Claustrophobia    Diabetes mellitus without complication ([place])    Esophageal adenocarcinoma ([place]) 04/21/2021   GERD (gastroesophageal reflux disease)    Glaucoma    using gtts for this.   Hypercholesterolemia    Hypertension     Past Surgical History:  Procedure Laterality Date   CATARACT EXTRACTION W/PHACO Left 12/03/2017   Procedure: CATARACT EXTRACTION PHACO AND INTRAOCULAR LENS PLACEMENT ([place]) COMPLICATED DIABETIC LEFT;  Surgeon: Leandrew Koyanagi, MD;  Location: [facility];  Service: Ophthalmology;  Laterality: Left;  Diabetic - oral meds   CHOLECYSTECTOMY     COLONOSCOPY  2016   PORTA CATH INSERTION N/A 11/13/2020   Procedure: PORTA CATH INSERTION;  Surgeon: Algernon Huxley, MD;  Location: [facility];  Service: Cardiovascular;  Laterality: N/A;    Family History  Problem Relation Age of Onset   Cancer Mother    Colon cancer Neg Hx    Colon polyps Neg Hx    Esophageal cancer Neg Hx    Rectal cancer Neg Hx    Stomach cancer Neg Hx     Social History:  reports that he quit smoking about 36 years ago. He has never used smokeless tobacco. He reports previous alcohol use. He reports that he does not use drugs. He quit smoking  cold [place] in 1986. He smoked 2 packs per day for 20 years. He drinks a glass of wine or a Margarita occasionally. 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 He was exposed to [employer]. He worked with a Geologist, engineering for 34 years. The patient is accompanied by his wife today.  Allergies: No Known Allergies  Current Medications: Current Outpatient Medications  Medication Sig Dispense Refill   APIXABAN (ELIQUIS) VTE STARTER PACK (10MG  AND 5MG ) Take as directed on package: start with two-5mg  tablets twice daily for 7 days. On day 8 (01/29/2021), switch to one-5mg  tablet twice daily. 1 tablet 0   brimonidine-timolol (COMBIGAN) 0.2-0.5 % ophthalmic solution Place 1 drop into the left eye daily.     calcium carbonate (OS-CAL) 600 MG TABS tablet Take by mouth.     citalopram (CELEXA) 20 MG tablet Take 1 tablet (20 mg total) by mouth daily. 30 tablet 1   diphenhydrAMINE HCl (BENADRYL ALLERGY PO) Take by mouth.     fluticasone (FLONASE) 50 MCG/ACT nasal spray Place into both nostrils.     glipiZIDE (GLUCOTROL XL) 5 MG 24 hr tablet Take 5 mg by mouth 2 (two) times daily.  1   glucose blood test strip      Magnesium Oxide (MAG-OXIDE) 200 MG TABS Take 400 mg by mouth daily.     Melatonin 3 MG CAPS  Take 5 mg by mouth.     metFORMIN (GLUCOPHAGE-XR) 500 MG 24 hr tablet Take 500 mg by mouth 2 (two) times daily. Takes 2 tablets and 1 tablet in the pm     pantoprazole (PROTONIX) 40 MG tablet Take by mouth.     simvastatin (ZOCOR) 40 MG tablet Take 40 mg by mouth daily at 6 PM.     vitamin B-12 (CYANOCOBALAMIN) 1000 MCG tablet Take 1,000 mcg by mouth daily.     VITAMIN D PO Take 800 Units by mouth daily.     ALPRAZolam (XANAX) 0.25 MG tablet Take 1 tablet (0.25 mg total) by mouth at bedtime as needed for anxiety. (Patient not taking: No sig reported) 60 tablet 0   magic mouthwash w/lidocaine SOLN Take 5 mLs by mouth. (Patient not taking: No sig reported)     sucralfate (CARAFATE) 1 g tablet  Take 0.5 tablets (0.5 g total) by mouth 3 (three) times daily. Dissolve in warm water, swish and swallow (Patient not taking: No sig reported) 60 tablet 1   traZODone (DESYREL) 50 MG tablet Take 0.5-1 tablets (25-50 mg total) by mouth at bedtime as needed for sleep. (Patient not taking: No sig reported) 30 tablet 2   Current Facility-Administered Medications  Medication Dose Route Frequency Provider Last Rate Last Admin   0.9 %  sodium chloride infusion  500 mL Intravenous Once Pyrtle, Lajuan Lines, MD       Facility-Administered Medications Ordered in Other Visits  Medication Dose Route Frequency Provider Last Rate Last Admin   0.9 % NaCl with KCl 20 mEq/ L  infusion   Intravenous Once Corcoran, Melissa C, MD       magnesium sulfate IVPB 2 g 50 mL  2 g Intravenous Once Corcoran, Melissa C, MD       sodium chloride flush (NS) 0.9 % injection 10 mL  10 mL Intracatheter PRN Earlie Server, MD        Review of Systems  Constitutional: Negative.  Negative for chills, fever, malaise/fatigue and weight loss.  HENT:  Negative for congestion, ear pain and tinnitus.   Eyes: Negative.  Negative for blurred vision and double vision.  Respiratory: Negative.  Negative for cough, sputum production and shortness of breath.   Cardiovascular:  Negative for chest pain and palpitations. Leg swelling: Right leg . Gastrointestinal: Negative.  Negative for abdominal pain, constipation, diarrhea, nausea and vomiting.  Genitourinary:  Negative for dysuria, frequency and urgency.  Musculoskeletal:  Negative for back pain and falls.  Skin: Negative.  Negative for rash.  Neurological: Negative.  Negative for weakness and headaches.  Endo/Heme/Allergies: Negative.  Does not bruise/bleed easily.  Psychiatric/Behavioral: Negative.  Negative for depression. The patient is not nervous/anxious and does not have insomnia.   Performance status (ECOG): 1  Vitals Blood pressure 116/68, pulse 68, temperature (!) 97 F (36.1 C), resp.  rate 18, weight 172 lb 6.4 oz (78.2 kg), SpO2 98 %.   Physical Exam Vitals and nursing note reviewed.  Constitutional:      General: He is not in acute distress.    Appearance: He is not diaphoretic.  HENT:     Head: Normocephalic and atraumatic.     Mouth/Throat:     Mouth: Mucous membranes are moist.     Pharynx: Oropharynx is clear.  Eyes:     General: No scleral icterus.    Extraocular Movements: Extraocular movements intact.     Conjunctiva/sclera: Conjunctivae normal.     Pupils: Pupils are  equal, round, and reactive to light.  Cardiovascular:     Rate and Rhythm: Normal rate and regular rhythm.     Heart sounds: Normal heart sounds. No murmur heard. Pulmonary:     Effort: Pulmonary effort is normal. No respiratory distress.     Breath sounds: Normal breath sounds. No wheezing or rales.  Chest:     Chest wall: No tenderness.  Breasts:    Right: No axillary adenopathy or supraclavicular adenopathy.     Left: No axillary adenopathy or supraclavicular adenopathy.  Abdominal:     General: Bowel sounds are normal. There is no distension.     Palpations: Abdomen is soft. There is no mass.     Tenderness: There is no abdominal tenderness. There is no guarding or rebound.  Musculoskeletal:        General: No swelling or tenderness. Normal range of motion.     Cervical back: Normal range of motion and neck supple.     Right lower leg: Swelling present.     Right ankle: Swelling present.  Lymphadenopathy:     Head:     Right side of head: No preauricular, posterior auricular or occipital adenopathy.     Left side of head: No preauricular, posterior auricular or occipital adenopathy.     Upper Body:     Right upper body: No supraclavicular or axillary adenopathy.     Left upper body: No supraclavicular or axillary adenopathy.     Lower Body: No right inguinal adenopathy. No left inguinal adenopathy.  Skin:    General: Skin is warm and dry.     Comments: Tan around neck s/p  radiation. Scar on right side of neck, fingertip sized.  Neurological:     Mental Status: He is alert and oriented to person, place, and time.     Comments: Left drop foot, improved.  Psychiatric:        Mood and Affect: Mood normal.   Infusion on 06/06/2021  Component Date Value Ref Range Status   Sodium 06/06/2021 133 (A) 135 - 145 mmol/L Final   Potassium 06/06/2021 4.0  3.5 - 5.1 mmol/L Final   Chloride 06/06/2021 100  98 - 111 mmol/L Final   CO2 06/06/2021 26  22 - 32 mmol/L Final   Glucose, Bld 06/06/2021 269 (A) 70 - 99 mg/dL Final   Glucose reference range applies only to samples taken after fasting for at least 8 hours.   BUN 06/06/2021 19  8 - 23 mg/dL Final   Creatinine, Ser 06/06/2021 0.81  0.61 - 1.24 mg/dL Final   Calcium 06/06/2021 8.9  8.9 - 10.3 mg/dL Final   Total Protein 06/06/2021 6.4 (A) 6.5 - 8.1 g/dL Final   Albumin 06/06/2021 3.9  3.5 - 5.0 g/dL Final   AST 06/06/2021 19  15 - 41 U/L Final   ALT 06/06/2021 17  0 - 44 U/L Final   Alkaline Phosphatase 06/06/2021 38  38 - 126 U/L Final   Total Bilirubin 06/06/2021 0.7  0.3 - 1.2 mg/dL Final   GFR, Estimated 06/06/2021 >60  >60 mL/min Final   Comment: (NOTE) Calculated using the CKD-EPI Creatinine Equation (2021)    Anion gap 06/06/2021 7  5 - 15 Final   Performed at [facility], [place], [place] [address]   WBC 06/06/2021 3.5 (A) 4.0 - 10.5 K/uL Final   RBC 06/06/2021 3.99 (A) 4.22 - 5.81 MIL/uL Final   Hemoglobin 06/06/2021 11.9 (A) 13.0 - 17.0  g/dL Final   HCT 06/06/2021 34.9 (A) 39.0 - 52.0 % Final   MCV 06/06/2021 87.5  80.0 - 100.0 fL Final   MCH 06/06/2021 29.8  26.0 - 34.0 pg Final   MCHC 06/06/2021 34.1  30.0 - 36.0 g/dL Final   RDW 06/06/2021 12.7  11.5 - 15.5 % Final   Platelets 06/06/2021 199  150 - 400 K/uL Final   nRBC 06/06/2021 0.0  0.0 - 0.2 % Final   Neutrophils Relative % 06/06/2021 83  % Final   Neutro Abs 06/06/2021 2.9  1.7 - 7.7 K/uL Final   Lymphocytes  Relative 06/06/2021 9  % Final   Lymphs Abs 06/06/2021 0.3 (A) 0.7 - 4.0 K/uL Final   Monocytes Relative 06/06/2021 6  % Final   Monocytes Absolute 06/06/2021 0.2  0.1 - 1.0 K/uL Final   Eosinophils Relative 06/06/2021 1  % Final   Eosinophils Absolute 06/06/2021 0.0  0.0 - 0.5 K/uL Final   Basophils Relative 06/06/2021 0  % Final   Basophils Absolute 06/06/2021 0.0  0.0 - 0.1 K/uL Final   Immature Granulocytes 06/06/2021 1  % Final   Abs Immature Granulocytes 06/06/2021 0.04  0.00 - 0.07 K/uL Final   Performed at [facility], [address] [place], [place] [address]    Assessment/Plan.  1. Esophageal adenocarcinoma ([place])   2. Carcinoma of base of tongue ([place])   3. Pulmonary embolism, bilateral ([place])   4. Encounter for antineoplastic chemotherapy     1. Clinical stage I right base of tongue carcinoma, Stage I, P16 positive.  He completed concurrent radiation and cisplatin on 01/11/2021. Patient declined PET scan and is reluctant to reschedule. 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  Proceed with weekly carboplatin and Taxol today.  Continue concurrent radiation.  3   Bilateral pulmonary emboli and RLE DVT - 01/2021 Continue Eliquis 5 mg twice daily  I discussed the assessment and treatment plan with the patient.  The patient was provided an opportunity to ask questions and all were answered.  The patient agreed with the plan and demonstrated an understanding of the instructions.  The patient was advised to call back if the symptoms worsen or if the condition fails to improve as anticipated.  Follow up in 1 week for lab md carboplatin and taxol.    Earlie Server, MD, PhD Hematology Oncology [place] at [facility] 06/06/2021

## 2021-06-07 ENCOUNTER — Ambulatory Visit
Admission: RE | Admit: 2021-06-07 | Discharge: 2021-06-07 | Disposition: A | Discharge status: Home / Self Care | Payer: No Typology Code available for payment source | Source: Ambulatory Visit | Attending: Radiation Oncology | Admitting: Radiation Oncology

## 2021-06-07 ENCOUNTER — Ambulatory Visit: Payer: No Typology Code available for payment source | Admitting: Radiation Oncology

## 2021-06-07 DIAGNOSIS — C01 Malignant neoplasm of base of tongue: Secondary | ICD-10-CM | POA: Diagnosis not present

## 2021-06-08 ENCOUNTER — Ambulatory Visit
Admission: RE | Admit: 2021-06-08 | Discharge: 2021-06-08 | Disposition: A | Discharge status: Home / Self Care | Payer: No Typology Code available for payment source | Source: Ambulatory Visit | Attending: Radiation Oncology | Admitting: Radiation Oncology

## 2021-06-08 DIAGNOSIS — C01 Malignant neoplasm of base of tongue: Secondary | ICD-10-CM | POA: Diagnosis not present

## 2021-06-11 ENCOUNTER — Ambulatory Visit
Admission: RE | Admit: 2021-06-11 | Discharge: 2021-06-11 | Disposition: A | Discharge status: Home / Self Care | Payer: No Typology Code available for payment source | Source: Ambulatory Visit | Attending: Radiation Oncology | Admitting: Radiation Oncology

## 2021-06-11 DIAGNOSIS — C01 Malignant neoplasm of base of tongue: Secondary | ICD-10-CM | POA: Diagnosis not present

## 2021-06-12 ENCOUNTER — Ambulatory Visit
Admission: RE | Admit: 2021-06-12 | Discharge: 2021-06-12 | Disposition: A | Discharge status: Home / Self Care | Payer: No Typology Code available for payment source | Source: Ambulatory Visit | Attending: Radiation Oncology | Admitting: Radiation Oncology

## 2021-06-12 DIAGNOSIS — C01 Malignant neoplasm of base of tongue: Secondary | ICD-10-CM | POA: Diagnosis not present

## 2021-06-13 ENCOUNTER — Inpatient Hospital Stay (HOSPITAL_BASED_OUTPATIENT_CLINIC_OR_DEPARTMENT_OTHER): Payer: No Typology Code available for payment source | Admitting: Oncology

## 2021-06-13 ENCOUNTER — Ambulatory Visit
Admission: RE | Admit: 2021-06-13 | Discharge: 2021-06-13 | Disposition: A | Discharge status: Home / Self Care | Payer: No Typology Code available for payment source | Source: Ambulatory Visit | Attending: Radiation Oncology | Admitting: Radiation Oncology

## 2021-06-13 ENCOUNTER — Inpatient Hospital Stay: Payer: No Typology Code available for payment source

## 2021-06-13 ENCOUNTER — Encounter: Payer: Self-pay | Admitting: Oncology

## 2021-06-13 VITALS — BP 123/80 | HR 64 | Temp 98.2°F | Resp 18 | Wt 174.5 lb

## 2021-06-13 DIAGNOSIS — C01 Malignant neoplasm of base of tongue: Secondary | ICD-10-CM | POA: Diagnosis not present

## 2021-06-13 DIAGNOSIS — Z5111 Encounter for antineoplastic chemotherapy: Secondary | ICD-10-CM

## 2021-06-13 DIAGNOSIS — C159 Malignant neoplasm of esophagus, unspecified: Secondary | ICD-10-CM

## 2021-06-13 DIAGNOSIS — I2699 Other pulmonary embolism without acute cor pulmonale: Secondary | ICD-10-CM

## 2021-06-13 LAB — COMPREHENSIVE METABOLIC PANEL
ALT: 21 U/L (ref 0–44)
AST: 21 U/L (ref 15–41)
Albumin: 4.1 g/dL (ref 3.5–5.0)
Alkaline Phosphatase: 35 U/L — ABNORMAL LOW (ref 38–126)
Anion gap: 5 (ref 5–15)
BUN: 16 mg/dL (ref 8–23)
CO2: 27 mmol/L (ref 22–32)
Calcium: 8.9 mg/dL (ref 8.9–10.3)
Chloride: 100 mmol/L (ref 98–111)
Creatinine, Ser: 0.75 mg/dL (ref 0.61–1.24)
GFR, Estimated: >60 mL/min (ref >60)
Glucose, Bld: 236 mg/dL — ABNORMAL HIGH (ref 70–99)
Potassium: 3.8 mmol/L (ref 3.5–5.1)
Sodium: 132 mmol/L — ABNORMAL LOW (ref 135–145)
Total Bilirubin: 0.6 mg/dL (ref 0.3–1.2)
Total Protein: 6.4 g/dL — ABNORMAL LOW (ref 6.5–8.1)

## 2021-06-13 LAB — CBC WITH DIFFERENTIAL/PLATELET
Abs Immature Granulocytes: 0.03 10*3/uL (ref 0.00–0.07)
Basophils Absolute: 0 10*3/uL (ref 0.0–0.1)
Basophils Relative: 0 %
Eosinophils Absolute: 0 10*3/uL (ref 0.0–0.5)
Eosinophils Relative: 0 %
HCT: 34.7 % — ABNORMAL LOW (ref 39.0–52.0)
Hemoglobin: 11.8 g/dL — ABNORMAL LOW (ref 13.0–17.0)
Immature Granulocytes: 1 %
Lymphocytes Relative: 9 %
Lymphs Abs: 0.3 10*3/uL — ABNORMAL LOW (ref 0.7–4.0)
MCH: 30.2 pg (ref 26.0–34.0)
MCHC: 34 g/dL (ref 30.0–36.0)
MCV: 88.7 fL (ref 80.0–100.0)
Monocytes Absolute: 0.2 10*3/uL (ref 0.1–1.0)
Monocytes Relative: 7 %
Neutro Abs: 2.5 10*3/uL (ref 1.7–7.7)
Neutrophils Relative %: 83 %
Platelets: 177 10*3/uL (ref 150–400)
RBC: 3.91 MIL/uL — ABNORMAL LOW (ref 4.22–5.81)
RDW: 13.3 % (ref 11.5–15.5)
WBC: 3 10*3/uL — ABNORMAL LOW (ref 4.0–10.5)
nRBC: 0 % (ref 0.0–0.2)

## 2021-06-13 LAB — MAGNESIUM: Magnesium: 1.5 mg/dL — ABNORMAL LOW (ref 1.7–2.4)

## 2021-06-13 MED ORDER — MAGNESIUM CHLORIDE 64 MG PO TBEC: 1.0000 | DELAYED_RELEASE_TABLET | Freq: Every day | ORAL | 1 refills | Status: DC

## 2021-06-13 MED ORDER — SUCRALFATE 1 G PO TABS: 0.5000 g | ORAL_TABLET | Freq: Three times a day (TID) | ORAL | 1 refills | Status: DC

## 2021-06-13 MED ORDER — SODIUM CHLORIDE 0.9 % IV SOLN
50.0000 mg/m2 | Freq: Once | INTRAVENOUS | Status: AC
  Administered 2021-06-13: 102 mg via INTRAVENOUS
  Filled 2021-06-13: qty 17

## 2021-06-13 MED ORDER — HEPARIN SOD (PORK) LOCK FLUSH 100 UNIT/ML IV SOLN
INTRAVENOUS | Status: AC
  Filled 2021-06-13: qty 5

## 2021-06-13 MED ORDER — SODIUM CHLORIDE 0.9 % IV SOLN
10.0000 mg | Freq: Once | INTRAVENOUS | Status: AC
  Administered 2021-06-13: 10 mg via INTRAVENOUS
  Filled 2021-06-13: qty 10

## 2021-06-13 MED ORDER — MAGNESIUM SULFATE 2 GM/50ML IV SOLN
2.0000 g | Freq: Once | INTRAVENOUS | Status: AC
  Administered 2021-06-13: 2 g via INTRAVENOUS
  Filled 2021-06-13: qty 50

## 2021-06-13 MED ORDER — FAMOTIDINE 20 MG IN NS 100 ML IVPB
20.0000 mg | Freq: Once | INTRAVENOUS | Status: AC
  Administered 2021-06-13: 20 mg via INTRAVENOUS
  Filled 2021-06-13: qty 20

## 2021-06-13 MED ORDER — SODIUM CHLORIDE 0.9% FLUSH
10.0000 mL | Freq: Once | INTRAVENOUS | Status: AC
  Administered 2021-06-13: 10 mL via INTRAVENOUS
  Filled 2021-06-13: qty 10

## 2021-06-13 MED ORDER — PALONOSETRON HCL INJECTION 0.25 MG/5ML
0.2500 mg | Freq: Once | INTRAVENOUS | Status: AC
  Administered 2021-06-13: 0.25 mg via INTRAVENOUS
  Filled 2021-06-13: qty 5

## 2021-06-13 MED ORDER — SODIUM CHLORIDE 0.9 % IV SOLN
Freq: Once | INTRAVENOUS | Status: AC
  Filled 2021-06-13: qty 250

## 2021-06-13 MED ORDER — DIPHENHYDRAMINE HCL 50 MG/ML IJ SOLN
50.0000 mg | Freq: Once | INTRAMUSCULAR | Status: AC
  Administered 2021-06-13: 50 mg via INTRAVENOUS
  Filled 2021-06-13: qty 1

## 2021-06-13 MED ORDER — HEPARIN SOD (PORK) LOCK FLUSH 100 UNIT/ML IV SOLN
500.0000 [IU] | Freq: Once | INTRAVENOUS | Status: AC | PRN
  Administered 2021-06-13: 500 [IU]
  Filled 2021-06-13: qty 5

## 2021-06-13 MED ORDER — SODIUM CHLORIDE 0.9 % IV SOLN
195.2000 mg | Freq: Once | INTRAVENOUS | Status: AC
  Administered 2021-06-13: 200 mg via INTRAVENOUS
  Filled 2021-06-13: qty 20

## 2021-06-13 NOTE — Patient Instructions (Signed)
[facility]  Discharge Instructions: Thank you for choosing [facility] to provide your oncology and hematology care.  If you have a lab appointment with the Cancer Center, please go directly to the Cancer Center and check in at the registration area.  Wear comfortable clothing and clothing appropriate for easy access to any Portacath or PICC line.   We strive to give you quality time with your provider. You may need to reschedule your appointment if you arrive late (15 or more minutes).  Arriving late affects you and other patients whose appointments are after yours.  Also, if you miss three or more appointments without notifying the office, you may be dismissed from the clinic at the provider's discretion.      For prescription refill requests, have your pharmacy contact our office and allow 72 hours for refills to be completed.    Today you received the following chemotherapy and/or immunotherapy agents: Taxol, Carboplatin      To help prevent nausea and vomiting after your treatment, we encourage you to take your nausea medication as directed.  BELOW ARE SYMPTOMS THAT SHOULD BE REPORTED IMMEDIATELY: *FEVER GREATER THAN 100.4 F (38 C) OR HIGHER *CHILLS OR SWEATING *NAUSEA AND VOMITING THAT IS NOT CONTROLLED WITH YOUR NAUSEA MEDICATION *UNUSUAL SHORTNESS OF BREATH *UNUSUAL BRUISING OR BLEEDING *URINARY PROBLEMS (pain or burning when urinating, or frequent urination) *BOWEL PROBLEMS (unusual diarrhea, constipation, pain near the anus) TENDERNESS IN MOUTH AND THROAT WITH OR WITHOUT PRESENCE OF ULCERS (sore throat, sores in mouth, or a toothache) UNUSUAL RASH, SWELLING OR PAIN  UNUSUAL VAGINAL DISCHARGE OR ITCHING   Items with * indicate a potential emergency and should be followed up as soon as possible or go to the Emergency Department if any problems should occur.  Please show the CHEMOTHERAPY ALERT CARD or IMMUNOTHERAPY ALERT CARD at  check-in to the Emergency Department and triage nurse.  Should you have questions after your visit or need to cancel or reschedule your appointment, please contact [facility]  [phone] and follow the prompts.  Office hours are 8:00 a.m. to 4:30 p.m. Monday - Friday. Please note that voicemails left after 4:00 p.m. may not be returned until the following business day.  We are closed weekends and major holidays. You have access to a nurse at all times for urgent questions. Please call the main number to the clinic [phone] and follow the prompts.  For any non-urgent questions, you may also contact your provider using MyChart. We now offer e-Visits for anyone 18 and older to request care online for non-urgent symptoms. For details visit [email or web address].   Also download the MyChart app! Go to the app store, search "MyChart", open the app, select [facility], and log in with your MyChart username and password.  Due to Covid, a mask is required upon entering the hospital/clinic. If you do not have a mask, one will be given to you upon arrival. For doctor visits, patients may have 1 support person aged 18 or older with them. For treatment visits, patients cannot have anyone with them due to current Covid guidelines and our immunocompromised population.  

## 2021-06-13 NOTE — Progress Notes (Signed)
Pt here for follow up. No new concerns voiced.   

## 2021-06-13 NOTE — Progress Notes (Signed)
Hematology Oncology Progress Note   Clinic Day:  06/13/2021  Referring physician: Derinda Late, MD  Chief Complaint: Sean Garner. is a 75 y.o. 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  PERTINENT ONCOLOGY HISTORY Patient previously followed up by Dr.Corcoran, patient switched care to me on 06/13/21 Extensive medical record review was performed by me  Clinical stage I (T1N1Mx) right base of tongue carcinoma s/p ultrasound guided right cervical lymph node biopsy on 10/11/2020.  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    09/28/2020 Neck soft tissue CT on  revealed a 3.5 x 3.0 x 4.8 cm right neck mass most c/w enlarged lymph node. There was a 14 mm enhancing mass in the right base of tongue.   PET scan on 10/25/2020 revealed right tongue base primary with ipsilateral level 2 nodal metastasis. There were no findings of extracervical metastatic disease. There was focal hypermetabolism in the distal esophagus, with possible concurrent soft tissue density lesion. Recommended correlation with endoscopy. There was vague right lower lobe low-level hypermetabolism and ground-glass nodularity, favored minimal infection or aspiration. Incidental findings included aortic atherosclerosis, coronary artery atherosclerosis, emphysema, a tiny hiatal hernia, and prostatomegaly. 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  Speech discrimination scores were 100% in each ear.  11/03/2020 Upper endoscopy on  revealed a malignant esophageal tumor at the gastroesophageal junction. There were esophageal mucosal changes c/w short-segment Barrett's esophagus. There was a 2 cm hiatal hernia. There was gastritis.  Pathology in the esophagus at 36 cm revealed intramucosal adenocarcinoma arising in a background of high grade dysplasia  and intestinal metaplasia; pathology at 38 cm revealed adenocarcinoma.  11/15/2020 EUS on  revealed early stage adenocarcinoma arising from Barrett's esophagus.  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   11/20/2020 - 12/27/2020  6 weeks of cisplatin with concurrent radiation  completed radiation on 01/11/2021.  01/19/2021 Chest abdomen and pelvis CT with contrast on  revealed extensive nearly occlusive pulmonary thromboembolic disease bilaterally with CT evidence of right heart strain c/w at least submassive (intermediate risk) PE. There was no evidence of metastatic disease within the chest, abdomen or pelvis.  Improvement in previously demonstrated distal esophageal wall thickening without apparent focal mass lesion. There was aortic atherosclerosis and emphysema. 01/20/2021 Bilateral lower extremity duplex on  revealed occlusive DVT in the right deep femoral vein. Started on anticoagulation with heparin drip and discharged off Eliquis. Prothrombin gene mutation and factor 5 leiden were both negative  He has PTSD and clastrophobia.  Anxiety was relieved by alprazolam prior to radiation.  He is on citalopram 30 mg a day and trazodone at night.  He was admitted to [facility] from 01/19/2021 - 01/22/2021 with bilateral pulmonary emboli. Bilateral lower extremity venous duplex was positive for occlusive DVT in the right deep femoral vein. He was placed on  and discharged on Eliquis.   # Patient canceled her PET scan that was ordered for assessment of treatment response for head and neck cancer. 04/09/2021, patient was seen by Dr. Elenor Quinones and underwent EGD. Polypoid tumor was seen at the GE junction, consistent with the location of the previously visualized tumor.  With no significant difference compared to photos from other endoscopies.  The proximal esophagus is involved.  Esophagectomy will be applicable. Biopsy was taken at 38 cm.  Dr. Elenor Quinones has  reached out  radiation oncology Dr. Baruch Gouty and recommend neoadjuvant chemo and radiation.  INTERVAL HISTORY Sean Plane. is a 75 y.o. 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 Currently on concurrent chemotherapy radiation for esophageal cancer treatments.  HeIs accompanied by his wife. Patient reports feeling well. So far no significant dysphagia or odynophagia.  No nausea vomiting diarrhea fever or chills   Past Medical History:  Diagnosis Date   Anxiety    Cancer ([place])    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   Claustrophobia    Diabetes mellitus without complication ([place])    Esophageal adenocarcinoma ([place]) 04/21/2021   GERD (gastroesophageal reflux disease)    Glaucoma    using gtts for this.   Hypercholesterolemia    Hypertension     Past Surgical History:  Procedure Laterality Date   CATARACT EXTRACTION W/PHACO Left 12/03/2017   Procedure: CATARACT EXTRACTION PHACO AND INTRAOCULAR LENS PLACEMENT ([place]) COMPLICATED DIABETIC LEFT;  Surgeon: Leandrew Koyanagi, MD;  Location: [facility];  Service: Ophthalmology;  Laterality: Left;  Diabetic - oral meds   CHOLECYSTECTOMY     COLONOSCOPY  2016   PORTA CATH INSERTION N/A 11/13/2020   Procedure: PORTA CATH INSERTION;  Surgeon: Algernon Huxley, MD;  Location: [facility];  Service: Cardiovascular;  Laterality: N/A;    Family History  Problem Relation Age of Onset   Cancer Mother    Colon cancer Neg Hx    Colon polyps Neg Hx    Esophageal cancer Neg Hx    Rectal cancer Neg Hx    Stomach cancer Neg Hx     Social History:  reports that he quit smoking about 36 years ago. He has never used smokeless tobacco. He reports previous alcohol use. He reports that he does not use drugs. He quit smoking  cold [place] in 1986. He smoked 2 packs per day for 20 years. He drinks a glass of wine or a Margarita occasionally. 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 He was exposed to [employer]. He worked with a Geologist, engineering for 34 years. The patient is accompanied by his wife today.  Allergies: No Known Allergies  Current Medications: Current Outpatient Medications  Medication Sig Dispense Refill   APIXABAN (ELIQUIS) VTE STARTER PACK ('10MG'$  AND '5MG'$ ) Take as directed on package: start with two-'5mg'$  tablets twice daily for 7 days. On day 8 (01/29/2021), switch to one-'5mg'$  tablet twice daily. 1 tablet 0   brimonidine-timolol (COMBIGAN) 0.2-0.5 % ophthalmic solution Place 1 drop into the left eye daily.     calcium carbonate (OS-CAL) 600 MG TABS tablet Take by mouth.     citalopram (CELEXA) 20 MG tablet Take 1 tablet (20 mg total) by mouth daily. 30 tablet 1   fluticasone (FLONASE) 50 MCG/ACT nasal spray Place into both nostrils.     glipiZIDE (GLUCOTROL XL) 5 MG 24 hr tablet Take 5 mg by mouth 2 (two) times daily.  1   glucose blood test strip      magnesium chloride (SLOW-MAG) 64 MG TBEC SR tablet Take 1 tablet (64 mg total) by mouth daily. 30 tablet 1   Melatonin 3 MG CAPS Take 5 mg by mouth.  metFORMIN (GLUCOPHAGE-XR) 500 MG 24 hr tablet Take 500 mg by mouth 2 (two) times daily. Takes 2 tablets and 1 tablet in the pm     pantoprazole (PROTONIX) 40 MG tablet Take by mouth.     simvastatin (ZOCOR) 40 MG tablet Take 40 mg by mouth daily at 6 PM.     vitamin B-12 (CYANOCOBALAMIN) 1000 MCG tablet Take 1,000 mcg by mouth daily.     VITAMIN D PO Take 800 Units by mouth daily.     ALPRAZolam (XANAX) 0.25 MG tablet Take 1 tablet (0.25 mg total) by mouth at bedtime as needed for anxiety. (Patient not taking: No sig reported) 60 tablet 0   magic mouthwash w/lidocaine SOLN Take 5 mLs by mouth. (Patient not taking: No sig reported)     sucralfate (CARAFATE) 1 g tablet Take 0.5 tablets (0.5 g  total) by mouth 3 (three) times daily. Dissolve in warm water, swish and swallow 60 tablet 1   traZODone (DESYREL) 50 MG tablet Take 0.5-1 tablets (25-50 mg total) by mouth at bedtime as needed for sleep. (Patient not taking: No sig reported) 30 tablet 2   Current Facility-Administered Medications  Medication Dose Route Frequency Provider Last Rate Last Admin   0.9 %  sodium chloride infusion  500 mL Intravenous Once Pyrtle, Lajuan Lines, MD       Facility-Administered Medications Ordered in Other Visits  Medication Dose Route Frequency Provider Last Rate Last Admin   0.9 % NaCl with KCl 20 mEq/ L  infusion   Intravenous Once Corcoran, Melissa C, MD       magnesium sulfate IVPB 2 g 50 mL  2 g Intravenous Once Lequita Asal, MD        Review of Systems  Constitutional: Negative.  Negative for chills, fever, malaise/fatigue and weight loss.  HENT:  Negative for congestion, ear pain and tinnitus.   Eyes: Negative.  Negative for blurred vision and double vision.  Respiratory: Negative.  Negative for cough, sputum production and shortness of breath.   Cardiovascular:  Negative for chest pain and palpitations.  Gastrointestinal: Negative.  Negative for abdominal pain, constipation, diarrhea, nausea and vomiting.  Genitourinary:  Negative for dysuria, frequency and urgency.  Musculoskeletal:  Negative for back pain and falls.  Skin: Negative.  Negative for rash.  Neurological: Negative.  Negative for weakness and headaches.  Endo/Heme/Allergies: Negative.  Does not bruise/bleed easily.  Psychiatric/Behavioral: Negative.  Negative for depression. The patient is not nervous/anxious and does not have insomnia.   Performance status (ECOG): 1  Vitals Blood pressure 123/80, pulse 64, temperature 98.2 F (36.8 C), resp. rate 18, weight 174 lb 8 oz (79.2 kg).   Physical Exam Vitals and nursing note reviewed.  Constitutional:      General: He is not in acute distress.    Appearance: He is not  diaphoretic.  HENT:     Head: Normocephalic and atraumatic.     Mouth/Throat:     Mouth: Mucous membranes are moist.     Pharynx: Oropharynx is clear.  Eyes:     General: No scleral icterus.    Extraocular Movements: Extraocular movements intact.     Conjunctiva/sclera: Conjunctivae normal.     Pupils: Pupils are equal, round, and reactive to light.  Cardiovascular:     Rate and Rhythm: Normal rate and regular rhythm.     Heart sounds: Normal heart sounds. No murmur heard. Pulmonary:     Effort: Pulmonary effort is normal. No respiratory distress.  Breath sounds: Normal breath sounds. No wheezing or rales.  Chest:     Chest wall: No tenderness.  Breasts:    Right: No axillary adenopathy or supraclavicular adenopathy.     Left: No axillary adenopathy or supraclavicular adenopathy.  Abdominal:     General: Bowel sounds are normal. There is no distension.     Palpations: Abdomen is soft. There is no mass.     Tenderness: There is no abdominal tenderness. There is no guarding or rebound.  Musculoskeletal:        General: No swelling or tenderness. Normal range of motion.     Cervical back: Normal range of motion and neck supple.     Right lower leg: Swelling present.     Right ankle: Swelling present.  Lymphadenopathy:     Head:     Right side of head: No preauricular, posterior auricular or occipital adenopathy.     Left side of head: No preauricular, posterior auricular or occipital adenopathy.     Upper Body:     Right upper body: No supraclavicular or axillary adenopathy.     Left upper body: No supraclavicular or axillary adenopathy.     Lower Body: No right inguinal adenopathy. No left inguinal adenopathy.  Skin:    General: Skin is warm and dry.     Comments: Tan around neck s/p radiation. Scar on right side of neck, fingertip sized.  Neurological:     Mental Status: He is alert and oriented to person, place, and time.     Comments: Left drop foot, improved.   Psychiatric:        Mood and Affect: Mood normal.   Infusion on 06/13/2021  Component Date Value Ref Range Status   Sodium 06/13/2021 132 (A) 135 - 145 mmol/L Final   Potassium 06/13/2021 3.8  3.5 - 5.1 mmol/L Final   Chloride 06/13/2021 100  98 - 111 mmol/L Final   CO2 06/13/2021 27  22 - 32 mmol/L Final   Glucose, Bld 06/13/2021 236 (A) 70 - 99 mg/dL Final   Glucose reference range applies only to samples taken after fasting for at least 8 hours.   BUN 06/13/2021 16  8 - 23 mg/dL Final   Creatinine, Ser 06/13/2021 0.75  0.61 - 1.24 mg/dL Final   Calcium 06/13/2021 8.9  8.9 - 10.3 mg/dL Final   Total Protein 06/13/2021 6.4 (A) 6.5 - 8.1 g/dL Final   Albumin 06/13/2021 4.1  3.5 - 5.0 g/dL Final   AST 06/13/2021 21  15 - 41 U/L Final   ALT 06/13/2021 21  0 - 44 U/L Final   Alkaline Phosphatase 06/13/2021 35 (A) 38 - 126 U/L Final   Total Bilirubin 06/13/2021 0.6  0.3 - 1.2 mg/dL Final   GFR, Estimated 06/13/2021 >60  >60 mL/min Final   Comment: (NOTE) Calculated using the CKD-EPI Creatinine Equation (2021)    Anion gap 06/13/2021 5  5 - 15 Final   Performed at [facility], [place], [place] [address]   WBC 06/13/2021 3.0 (A) 4.0 - 10.5 K/uL Final   RBC 06/13/2021 3.91 (A) 4.22 - 5.81 MIL/uL Final   Hemoglobin 06/13/2021 11.8 (A) 13.0 - 17.0 g/dL Final   HCT 06/13/2021 34.7 (A) 39.0 - 52.0 % Final   MCV 06/13/2021 88.7  80.0 - 100.0 fL Final   MCH 06/13/2021 30.2  26.0 - 34.0 pg Final   MCHC 06/13/2021 34.0  30.0 - 36.0 g/dL Final   RDW  06/13/2021 13.3  11.5 - 15.5 % Final   Platelets 06/13/2021 177  150 - 400 K/uL Final   nRBC 06/13/2021 0.0  0.0 - 0.2 % Final   Neutrophils Relative % 06/13/2021 83  % Final   Neutro Abs 06/13/2021 2.5  1.7 - 7.7 K/uL Final   Lymphocytes Relative 06/13/2021 9  % Final   Lymphs Abs 06/13/2021 0.3 (A) 0.7 - 4.0 K/uL Final   Monocytes Relative 06/13/2021 7  % Final   Monocytes Absolute 06/13/2021 0.2  0.1 - 1.0 K/uL Final    Eosinophils Relative 06/13/2021 0  % Final   Eosinophils Absolute 06/13/2021 0.0  0.0 - 0.5 K/uL Final   Basophils Relative 06/13/2021 0  % Final   Basophils Absolute 06/13/2021 0.0  0.0 - 0.1 K/uL Final   Immature Granulocytes 06/13/2021 1  % Final   Abs Immature Granulocytes 06/13/2021 0.03  0.00 - 0.07 K/uL Final   Performed at [facility], [address] [place], [place] [address]   Magnesium 06/13/2021 1.5 (A) 1.7 - 2.4 mg/dL Final   Performed at [facility], [address], [place], [place] [address]    Assessment/Plan.  1. Esophageal adenocarcinoma ([place])   2. Encounter for antineoplastic chemotherapy   3. Pulmonary embolism, bilateral ([facility])   4. Carcinoma of base of tongue ([place])     1. Clinical stage I right base of tongue carcinoma, Stage I, P16 positive.  He completed concurrent radiation and cisplatin on 01/11/2021. Patient declined PET scan and is reluctant to reschedule. 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  Proceed with weekly carboplatin and Taxol today.  Continue concurrent radiation. I refilled patient's prescription of Carafate and Magic mouthwash for possible radiation induced esophagitis in the near future.  3   Bilateral pulmonary emboli and RLE DVT - 01/2021 Continue Eliquis 5 mg twice daily-consider eventually to switch to low-dose Eliquis in September 2022  4 hypomagnesia, magnesium 1.5 today recommend patient to start Slow-Mag 64 mg daily.  Prescription sent to pharmacy.  Patient will receive IV magnesium 2 g x 1.  I discussed the assessment and treatment plan with the patient.  The patient was provided an opportunity to ask questions and all were answered.  The patient agreed with the plan and demonstrated an understanding of the instructions.  The patient was advised to call back if the symptoms worsen or if the  condition fails to improve as anticipated.  Follow up in 1 week for lab md carboplatin and taxol.    Earlie Server, MD, PhD Hematology Oncology [place] at [facility] 06/13/2021

## 2021-06-14 ENCOUNTER — Ambulatory Visit
Admission: RE | Admit: 2021-06-14 | Discharge: 2021-06-14 | Disposition: A | Discharge status: Home / Self Care | Payer: No Typology Code available for payment source | Source: Ambulatory Visit | Attending: Radiation Oncology | Admitting: Radiation Oncology

## 2021-06-14 DIAGNOSIS — C01 Malignant neoplasm of base of tongue: Secondary | ICD-10-CM | POA: Diagnosis not present

## 2021-06-15 ENCOUNTER — Ambulatory Visit
Admission: RE | Admit: 2021-06-15 | Discharge: 2021-06-15 | Disposition: A | Discharge status: Home / Self Care | Payer: No Typology Code available for payment source | Source: Ambulatory Visit | Attending: Radiation Oncology | Admitting: Radiation Oncology

## 2021-06-15 DIAGNOSIS — C01 Malignant neoplasm of base of tongue: Secondary | ICD-10-CM | POA: Diagnosis not present

## 2021-06-18 ENCOUNTER — Ambulatory Visit: Payer: No Typology Code available for payment source | Admitting: Oncology

## 2021-06-18 ENCOUNTER — Other Ambulatory Visit: Payer: No Typology Code available for payment source

## 2021-06-18 ENCOUNTER — Ambulatory Visit: Payer: No Typology Code available for payment source

## 2021-06-18 ENCOUNTER — Ambulatory Visit
Admission: RE | Admit: 2021-06-18 | Discharge: 2021-06-18 | Disposition: A | Discharge status: Home / Self Care | Payer: No Typology Code available for payment source | Source: Ambulatory Visit | Attending: Radiation Oncology | Admitting: Radiation Oncology

## 2021-06-18 DIAGNOSIS — C01 Malignant neoplasm of base of tongue: Secondary | ICD-10-CM | POA: Insufficient documentation

## 2021-06-19 ENCOUNTER — Ambulatory Visit
Admission: RE | Admit: 2021-06-19 | Discharge: 2021-06-19 | Disposition: A | Discharge status: Home / Self Care | Payer: No Typology Code available for payment source | Source: Ambulatory Visit | Attending: Radiation Oncology | Admitting: Radiation Oncology

## 2021-06-19 DIAGNOSIS — C01 Malignant neoplasm of base of tongue: Secondary | ICD-10-CM | POA: Diagnosis not present

## 2021-06-20 ENCOUNTER — Inpatient Hospital Stay: Payer: No Typology Code available for payment source

## 2021-06-20 ENCOUNTER — Inpatient Hospital Stay (HOSPITAL_BASED_OUTPATIENT_CLINIC_OR_DEPARTMENT_OTHER): Payer: No Typology Code available for payment source | Admitting: Oncology

## 2021-06-20 ENCOUNTER — Ambulatory Visit
Admission: RE | Admit: 2021-06-20 | Discharge: 2021-06-20 | Disposition: A | Discharge status: Home / Self Care | Payer: No Typology Code available for payment source | Source: Ambulatory Visit | Attending: Radiation Oncology | Admitting: Radiation Oncology

## 2021-06-20 ENCOUNTER — Encounter: Payer: Self-pay | Admitting: Oncology

## 2021-06-20 ENCOUNTER — Inpatient Hospital Stay: Payer: No Typology Code available for payment source | Attending: Hematology and Oncology

## 2021-06-20 VITALS — BP 120/70 | HR 61 | Temp 97.2°F | Resp 18 | Wt 169.9 lb

## 2021-06-20 DIAGNOSIS — C160 Malignant neoplasm of cardia: Secondary | ICD-10-CM | POA: Insufficient documentation

## 2021-06-20 DIAGNOSIS — C159 Malignant neoplasm of esophagus, unspecified: Secondary | ICD-10-CM

## 2021-06-20 DIAGNOSIS — Z87891 Personal history of nicotine dependence: Secondary | ICD-10-CM | POA: Diagnosis not present

## 2021-06-20 DIAGNOSIS — I2699 Other pulmonary embolism without acute cor pulmonale: Secondary | ICD-10-CM

## 2021-06-20 DIAGNOSIS — Z5111 Encounter for antineoplastic chemotherapy: Secondary | ICD-10-CM

## 2021-06-20 DIAGNOSIS — D696 Thrombocytopenia, unspecified: Secondary | ICD-10-CM

## 2021-06-20 DIAGNOSIS — C01 Malignant neoplasm of base of tongue: Secondary | ICD-10-CM | POA: Diagnosis present

## 2021-06-20 DIAGNOSIS — D72810 Lymphocytopenia: Secondary | ICD-10-CM

## 2021-06-20 DIAGNOSIS — Z95828 Presence of other vascular implants and grafts: Secondary | ICD-10-CM

## 2021-06-20 LAB — CBC WITH DIFFERENTIAL/PLATELET
Abs Immature Granulocytes: 0.02 10*3/uL (ref 0.00–0.07)
Abs Immature Granulocytes: 0.04 10*3/uL (ref 0.00–0.07)
Basophils Absolute: 0 10*3/uL (ref 0.0–0.1)
Basophils Absolute: 0 10*3/uL (ref 0.0–0.1)
Basophils Relative: 0 %
Basophils Relative: 1 %
Eosinophils Absolute: 0 10*3/uL (ref 0.0–0.5)
Eosinophils Absolute: 0 10*3/uL (ref 0.0–0.5)
Eosinophils Relative: 0 %
Eosinophils Relative: 0 %
HCT: 31.9 % — ABNORMAL LOW (ref 39.0–52.0)
HCT: 32.7 % — ABNORMAL LOW (ref 39.0–52.0)
Hemoglobin: 11 g/dL — ABNORMAL LOW (ref 13.0–17.0)
Hemoglobin: 11.4 g/dL — ABNORMAL LOW (ref 13.0–17.0)
Immature Granulocytes: 1 %
Immature Granulocytes: 1 %
Lymphocytes Relative: 10 %
Lymphocytes Relative: 10 %
Lymphs Abs: 0.3 10*3/uL — ABNORMAL LOW (ref 0.7–4.0)
Lymphs Abs: 0.3 10*3/uL — ABNORMAL LOW (ref 0.7–4.0)
MCH: 30.6 pg (ref 26.0–34.0)
MCH: 30.9 pg (ref 26.0–34.0)
MCHC: 34.5 g/dL (ref 30.0–36.0)
MCHC: 34.9 g/dL (ref 30.0–36.0)
MCV: 88.6 fL (ref 80.0–100.0)
MCV: 88.6 fL (ref 80.0–100.0)
Monocytes Absolute: 0.2 10*3/uL (ref 0.1–1.0)
Monocytes Absolute: 0.3 10*3/uL (ref 0.1–1.0)
Monocytes Relative: 7 %
Monocytes Relative: 9 %
Neutro Abs: 2.2 10*3/uL (ref 1.7–7.7)
Neutro Abs: 2.5 10*3/uL (ref 1.7–7.7)
Neutrophils Relative %: 82 %
Neutrophils Relative %: 79 %
Platelets: 138 10*3/uL — ABNORMAL LOW (ref 150–400)
Platelets: 148 10*3/uL — ABNORMAL LOW (ref 150–400)
RBC: 3.6 MIL/uL — ABNORMAL LOW (ref 4.22–5.81)
RBC: 3.69 MIL/uL — ABNORMAL LOW (ref 4.22–5.81)
RDW: 13.9 % (ref 11.5–15.5)
RDW: 13.9 % (ref 11.5–15.5)
WBC: 2.7 10*3/uL — ABNORMAL LOW (ref 4.0–10.5)
WBC: 3.2 10*3/uL — ABNORMAL LOW (ref 4.0–10.5)
nRBC: 0 % (ref 0.0–0.2)
nRBC: 0 % (ref 0.0–0.2)

## 2021-06-20 LAB — COMPREHENSIVE METABOLIC PANEL
ALT: 29 U/L (ref 0–44)
AST: 35 U/L (ref 15–41)
Albumin: 4.1 g/dL (ref 3.5–5.0)
Alkaline Phosphatase: 38 U/L (ref 38–126)
Anion gap: 4 — ABNORMAL LOW (ref 5–15)
BUN: 14 mg/dL (ref 8–23)
CO2: 28 mmol/L (ref 22–32)
Calcium: 9.1 mg/dL (ref 8.9–10.3)
Chloride: 102 mmol/L (ref 98–111)
Creatinine, Ser: 0.81 mg/dL (ref 0.61–1.24)
GFR, Estimated: >60 mL/min (ref >60)
Glucose, Bld: 130 mg/dL — ABNORMAL HIGH (ref 70–99)
Potassium: 4.2 mmol/L (ref 3.5–5.1)
Sodium: 134 mmol/L — ABNORMAL LOW (ref 135–145)
Total Bilirubin: 0.6 mg/dL (ref 0.3–1.2)
Total Protein: 6.6 g/dL (ref 6.5–8.1)

## 2021-06-20 LAB — MAGNESIUM: Magnesium: 1.8 mg/dL (ref 1.7–2.4)

## 2021-06-20 MED ORDER — SODIUM CHLORIDE 0.9 % IV SOLN
195.2000 mg | Freq: Once | INTRAVENOUS | Status: AC
  Administered 2021-06-20: 200 mg via INTRAVENOUS
  Filled 2021-06-20: qty 20

## 2021-06-20 MED ORDER — DIPHENHYDRAMINE HCL 50 MG/ML IJ SOLN
50.0000 mg | Freq: Once | INTRAMUSCULAR | Status: AC
  Administered 2021-06-20: 50 mg via INTRAVENOUS
  Filled 2021-06-20: qty 1

## 2021-06-20 MED ORDER — SODIUM CHLORIDE 0.9 % IV SOLN
10.0000 mg | Freq: Once | INTRAVENOUS | Status: AC
  Administered 2021-06-20: 10 mg via INTRAVENOUS
  Filled 2021-06-20: qty 10

## 2021-06-20 MED ORDER — HEPARIN SOD (PORK) LOCK FLUSH 100 UNIT/ML IV SOLN
500.0000 [IU] | Freq: Once | INTRAVENOUS | Status: AC
  Administered 2021-06-20: 500 [IU] via INTRAVENOUS
  Filled 2021-06-20: qty 5

## 2021-06-20 MED ORDER — SODIUM CHLORIDE 0.9 % IV SOLN
Freq: Once | INTRAVENOUS | Status: AC
  Filled 2021-06-20: qty 250

## 2021-06-20 MED ORDER — SODIUM CHLORIDE 0.9 % IV SOLN
50.0000 mg/m2 | Freq: Once | INTRAVENOUS | Status: AC
  Administered 2021-06-20: 102 mg via INTRAVENOUS
  Filled 2021-06-20: qty 17

## 2021-06-20 MED ORDER — PALONOSETRON HCL INJECTION 0.25 MG/5ML
0.2500 mg | Freq: Once | INTRAVENOUS | Status: AC
  Administered 2021-06-20: 0.25 mg via INTRAVENOUS
  Filled 2021-06-20: qty 5

## 2021-06-20 MED ORDER — FAMOTIDINE 20 MG IN NS 100 ML IVPB
20.0000 mg | Freq: Once | INTRAVENOUS | Status: AC
  Administered 2021-06-20: 20 mg via INTRAVENOUS
  Filled 2021-06-20: qty 20

## 2021-06-20 NOTE — Progress Notes (Signed)
Nutrition Follow-up:  Patient with history of stage I right base of the tongue cancer and adenocarcinoma of GE junction.  Patient completed treatment for base of tongue cancer and now being treated for esophageal cancer with chemotherapy and radiation.   Met with patient during infusion.  Patient reports that he is able to tolerate all consistencies of foods and denies dysphagia.  Reports that he has not been drinking very many shakes.  "How do I gain weight."    Medications: glipzide, MMW, Mag, metformin, protonix, carafate, Vit D, Vit B 12  Labs: reviewed  Anthropometrics:   Weight 169 lb 14.4 oz today  Weight last seen by RD on 02/20/21 after treatment for base of tongue cancer 190 lb  11% weight loss in the last 4 months   Estimated Energy Needs  Kcals: 1925-2300 Protein: 92-115 g Fluid: 1.9 L  NUTRITION DIAGNOSIS: Inadequate oral intake related to cancer and cancer related treatment side effects as evidenced by 11% weight loss in the last 4 months    INTERVENTION:  Discussed high calories and protein diet. Handout provided Recommend 350 calorie shake or higher.  Patient has been getting 30 g protein shake which are typically lower in calories. Patient will drink TID plus eat meals Contact information provided     MONITORING, EVALUATION, GOAL: weight trends, intake   NEXT VISIT: Wednesday, Aug 10th during infusion  Verta Riedlinger B. Zenia Resides, [place], [place] Registered Dietitian [phone] (mobile)

## 2021-06-20 NOTE — Progress Notes (Signed)
Hematology Oncology Progress Note   Clinic Day:  06/20/2021  Referring physician: Derinda Late, MD  Chief Complaint: Sean Garner. is a 75 y.o. 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  PERTINENT ONCOLOGY HISTORY Patient previously followed up by Dr.Corcoran, patient switched care to me on 06/20/21 Extensive medical record review was performed by me  Clinical stage I (T1N1Mx) right base of tongue carcinoma s/p ultrasound guided right cervical lymph node biopsy on 10/11/2020.  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    09/28/2020 Neck soft tissue CT on  revealed a 3.5 x 3.0 x 4.8 cm right neck mass most c/w enlarged lymph node. There was a 14 mm enhancing mass in the right base of tongue.   PET scan on 10/25/2020 revealed right tongue base primary with ipsilateral level 2 nodal metastasis. There were no findings of extracervical metastatic disease. There was focal hypermetabolism in the distal esophagus, with possible concurrent soft tissue density lesion. Recommended correlation with endoscopy. There was vague right lower lobe low-level hypermetabolism and ground-glass nodularity, favored minimal infection or aspiration. Incidental findings included aortic atherosclerosis, coronary artery atherosclerosis, emphysema, a tiny hiatal hernia, and prostatomegaly. 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  Speech discrimination scores were 100% in each ear.  11/03/2020 Upper endoscopy on  revealed a malignant esophageal tumor at the gastroesophageal junction. There were esophageal mucosal changes c/w short-segment Barrett's esophagus. There was a 2 cm hiatal hernia. There was gastritis.  Pathology in the esophagus at 36 cm revealed intramucosal adenocarcinoma arising in a background of high grade dysplasia  and intestinal metaplasia; pathology at 38 cm revealed adenocarcinoma.  11/15/2020 EUS on  revealed early stage adenocarcinoma arising from Barrett's esophagus.  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   11/20/2020 - 12/27/2020  6 weeks of cisplatin with concurrent radiation  completed radiation on 01/11/2021.  01/19/2021 Chest abdomen and pelvis CT with contrast on  revealed extensive nearly occlusive pulmonary thromboembolic disease bilaterally with CT evidence of right heart strain c/w at least submassive (intermediate risk) PE. There was no evidence of metastatic disease within the chest, abdomen or pelvis.  Improvement in previously demonstrated distal esophageal wall thickening without apparent focal mass lesion. There was aortic atherosclerosis and emphysema. 01/20/2021 Bilateral lower extremity duplex on  revealed occlusive DVT in the right deep femoral vein. Started on anticoagulation with heparin drip and discharged off Eliquis. Prothrombin gene mutation and factor 5 leiden were both negative  He has PTSD and clastrophobia.  Anxiety was relieved by alprazolam prior to radiation.  He is on citalopram 30 mg a day and trazodone at night.  He was admitted to [facility] from 01/19/2021 - 01/22/2021 with bilateral pulmonary emboli. Bilateral lower extremity venous duplex was positive for occlusive DVT in the right deep femoral vein. He was placed on  and discharged on Eliquis.   # Patient canceled her PET scan that was ordered for assessment of treatment response for head and neck cancer. 04/09/2021, patient was seen by Dr. Elenor Quinones and underwent EGD. Polypoid tumor was seen at the GE junction, consistent with the location of the previously visualized tumor.  With no significant difference compared to photos from other endoscopies.  The proximal esophagus is involved.  Esophagectomy will be applicable. Biopsy was taken at 38 cm.  Dr. Elenor Quinones has  reached out  radiation oncology Dr. Baruch Gouty and recommend neoadjuvant chemo and radiation.  INTERVAL HISTORY Sean Garner. is a 75 y.o. 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 Currently on concurrent chemotherapy radiation for esophageal cancer treatments.  Overall tolerates well.  Denies any neuropathy symptoms. Mild throat discomfort.  No significant dysphagia or odynophagia.  No nausea vomiting diarrhea fever chills. Patient is going to take a few days break off radiation and resume next week.  Past Medical History:  Diagnosis Date   Anxiety    Cancer ([place])    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   Claustrophobia    Diabetes mellitus without complication ([place])    Esophageal adenocarcinoma ([place]) 04/21/2021   GERD (gastroesophageal reflux disease)    Glaucoma    using gtts for this.   Hypercholesterolemia    Hypertension     Past Surgical History:  Procedure Laterality Date   CATARACT EXTRACTION W/PHACO Left 12/03/2017   Procedure: CATARACT EXTRACTION PHACO AND INTRAOCULAR LENS PLACEMENT ([place]) COMPLICATED DIABETIC LEFT;  Surgeon: Leandrew Koyanagi, MD;  Location: [facility];  Service: Ophthalmology;  Laterality: Left;  Diabetic - oral meds   CHOLECYSTECTOMY     COLONOSCOPY  2016   PORTA CATH INSERTION N/A 11/13/2020   Procedure: PORTA CATH INSERTION;  Surgeon: Algernon Huxley, MD;  Location: [facility];  Service: Cardiovascular;  Laterality: N/A;    Family History  Problem Relation Age of Onset   Cancer Mother    Colon cancer Neg Hx    Colon polyps Neg Hx    Esophageal cancer Neg Hx    Rectal cancer Neg Hx    Stomach cancer Neg Hx     Social History:  reports that he quit smoking about 36 years ago. He has never used smokeless  tobacco. He reports previous alcohol use. He reports that he does not use drugs. He quit smoking cold [place] in 1986. He smoked 2 packs per day for 20 years. He drinks a glass of wine or a Margarita occasionally. 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 He was exposed to [employer]. He worked with a Geologist, engineering for 34 years. The patient is accompanied by his wife today.  Allergies: No Known Allergies  Current Medications: Current Outpatient Medications  Medication Sig Dispense Refill   APIXABAN (ELIQUIS) VTE STARTER PACK ('10MG'$  AND '5MG'$ ) Take as directed on package: start with two-'5mg'$  tablets twice daily for 7 days. On day 8 (01/29/2021), switch to one-'5mg'$  tablet twice daily. 1 tablet 0   brimonidine-timolol (COMBIGAN) 0.2-0.5 % ophthalmic solution Place 1 drop into the left eye daily.     calcium carbonate (OS-CAL) 600 MG TABS tablet Take by mouth.     citalopram (CELEXA) 20 MG tablet Take 1 tablet (20 mg total) by mouth daily. 30 tablet 1   glipiZIDE (GLUCOTROL XL) 5 MG 24 hr tablet Take 5 mg by mouth 2 (two) times daily.  1   glucose blood test strip      magnesium chloride (SLOW-MAG) 64 MG TBEC SR tablet Take 1 tablet (64 mg total) by mouth daily. 30 tablet 1   Melatonin 3 MG CAPS Take 5 mg by mouth.  metFORMIN (GLUCOPHAGE-XR) 500 MG 24 hr tablet Take 500 mg by mouth 2 (two) times daily. Takes 2 tablets and 1 tablet in the pm     pantoprazole (PROTONIX) 40 MG tablet Take by mouth.     simvastatin (ZOCOR) 40 MG tablet Take 40 mg by mouth daily at 6 PM.     vitamin B-12 (CYANOCOBALAMIN) 1000 MCG tablet Take 1,000 mcg by mouth daily.     VITAMIN D PO Take 800 Units by mouth daily.     ALPRAZolam (XANAX) 0.25 MG tablet Take 1 tablet (0.25 mg total) by mouth at bedtime as needed for anxiety. (Patient not taking: No sig reported) 60 tablet 0   fluticasone (FLONASE) 50 MCG/ACT nasal spray Place into both nostrils. (Patient not taking: Reported on 06/20/2021)     magic mouthwash  w/lidocaine SOLN Take 5 mLs by mouth. (Patient not taking: No sig reported)     sucralfate (CARAFATE) 1 g tablet Take 0.5 tablets (0.5 g total) by mouth 3 (three) times daily. Dissolve in warm water, swish and swallow (Patient not taking: Reported on 06/20/2021) 60 tablet 1   traZODone (DESYREL) 50 MG tablet Take 0.5-1 tablets (25-50 mg total) by mouth at bedtime as needed for sleep. (Patient not taking: No sig reported) 30 tablet 2   Current Facility-Administered Medications  Medication Dose Route Frequency Provider Last Rate Last Admin   0.9 %  sodium chloride infusion  500 mL Intravenous Once Pyrtle, Lajuan Lines, MD       Facility-Administered Medications Ordered in Other Visits  Medication Dose Route Frequency Provider Last Rate Last Admin   0.9 % NaCl with KCl 20 mEq/ L  infusion   Intravenous Once Corcoran, Melissa C, MD       magnesium sulfate IVPB 2 g 50 mL  2 g Intravenous Once Lequita Asal, MD        Review of Systems  Constitutional: Negative.  Negative for chills, fever, malaise/fatigue and weight loss.  HENT:  Negative for congestion, ear pain and tinnitus.   Eyes: Negative.  Negative for blurred vision and double vision.  Respiratory: Negative.  Negative for cough, sputum production and shortness of breath.   Cardiovascular:  Negative for chest pain and palpitations.  Gastrointestinal: Negative.  Negative for abdominal pain, constipation, diarrhea, nausea and vomiting.  Genitourinary:  Negative for dysuria, frequency and urgency.  Musculoskeletal:  Negative for back pain and falls.  Skin: Negative.  Negative for rash.  Neurological: Negative.  Negative for weakness and headaches.  Endo/Heme/Allergies: Negative.  Does not bruise/bleed easily.  Psychiatric/Behavioral: Negative.  Negative for depression. The patient is not nervous/anxious and does not have insomnia.   Performance status (ECOG): 1  Vitals Blood pressure 120/70, pulse 61, temperature (!) 97.2 F (36.2 C), resp.  rate 18, weight 169 lb 14.4 oz (77.1 kg).   Physical Exam Vitals and nursing note reviewed.  Constitutional:      General: He is not in acute distress.    Appearance: He is not diaphoretic.  HENT:     Head: Normocephalic and atraumatic.     Mouth/Throat:     Mouth: Mucous membranes are moist.     Pharynx: Oropharynx is clear.  Eyes:     General: No scleral icterus.    Extraocular Movements: Extraocular movements intact.     Conjunctiva/sclera: Conjunctivae normal.     Pupils: Pupils are equal, round, and reactive to light.  Cardiovascular:     Rate and Rhythm: Normal rate and regular  rhythm.     Heart sounds: Normal heart sounds. No murmur heard. Pulmonary:     Effort: Pulmonary effort is normal. No respiratory distress.     Breath sounds: Normal breath sounds. No wheezing or rales.  Chest:     Chest wall: No tenderness.  Breasts:    Right: No axillary adenopathy or supraclavicular adenopathy.     Left: No axillary adenopathy or supraclavicular adenopathy.  Abdominal:     General: Bowel sounds are normal. There is no distension.     Palpations: Abdomen is soft. There is no mass.     Tenderness: There is no abdominal tenderness. There is no guarding or rebound.  Musculoskeletal:        General: No swelling or tenderness. Normal range of motion.     Cervical back: Normal range of motion and neck supple.     Right lower leg: Swelling present.     Right ankle: Swelling present.  Lymphadenopathy:     Head:     Right side of head: No preauricular, posterior auricular or occipital adenopathy.     Left side of head: No preauricular, posterior auricular or occipital adenopathy.     Upper Body:     Right upper body: No supraclavicular or axillary adenopathy.     Left upper body: No supraclavicular or axillary adenopathy.     Lower Body: No right inguinal adenopathy. No left inguinal adenopathy.  Skin:    General: Skin is warm and dry.     Comments: Tan around neck s/p radiation.  Scar on right side of neck, fingertip sized.  Neurological:     Mental Status: He is alert and oriented to person, place, and time.     Comments: Left drop foot, improved.  Psychiatric:        Mood and Affect: Mood normal.   Infusion on 06/20/2021  Component Date Value Ref Range Status   WBC 06/20/2021 3.2 (A) 4.0 - 10.5 K/uL Final   RBC 06/20/2021 3.69 (A) 4.22 - 5.81 MIL/uL Final   Hemoglobin 06/20/2021 11.4 (A) 13.0 - 17.0 g/dL Final   HCT 06/20/2021 32.7 (A) 39.0 - 52.0 % Final   MCV 06/20/2021 88.6  80.0 - 100.0 fL Final   MCH 06/20/2021 30.9  26.0 - 34.0 pg Final   MCHC 06/20/2021 34.9  30.0 - 36.0 g/dL Final   RDW 06/20/2021 13.9  11.5 - 15.5 % Final   Platelets 06/20/2021 148 (A) 150 - 400 K/uL Final   nRBC 06/20/2021 0.0  0.0 - 0.2 % Final   Neutrophils Relative % 06/20/2021 79  % Final   Neutro Abs 06/20/2021 2.5  1.7 - 7.7 K/uL Final   Lymphocytes Relative 06/20/2021 10  % Final   Lymphs Abs 06/20/2021 0.3 (A) 0.7 - 4.0 K/uL Final   Monocytes Relative 06/20/2021 9  % Final   Monocytes Absolute 06/20/2021 0.3  0.1 - 1.0 K/uL Final   Eosinophils Relative 06/20/2021 0  % Final   Eosinophils Absolute 06/20/2021 0.0  0.0 - 0.5 K/uL Final   Basophils Relative 06/20/2021 1  % Final   Basophils Absolute 06/20/2021 0.0  0.0 - 0.1 K/uL Final   Immature Granulocytes 06/20/2021 1  % Final   Abs Immature Granulocytes 06/20/2021 0.04  0.00 - 0.07 K/uL Final   Performed at [facility], [place], [place] [address]   Sodium 06/20/2021 134 (A) 135 - 145 mmol/L Final   Potassium 06/20/2021 4.2  3.5 - 5.1 mmol/L  Final   Chloride 06/20/2021 102  98 - 111 mmol/L Final   CO2 06/20/2021 28  22 - 32 mmol/L Final   Glucose, Bld 06/20/2021 130 (A) 70 - 99 mg/dL Final   Glucose reference range applies only to samples taken after fasting for at least 8 hours.   BUN 06/20/2021 14  8 - 23 mg/dL Final   Creatinine, Ser 06/20/2021 0.81  0.61 - 1.24 mg/dL Final   Calcium  06/20/2021 9.1  8.9 - 10.3 mg/dL Final   Total Protein 06/20/2021 6.6  6.5 - 8.1 g/dL Final   Albumin 06/20/2021 4.1  3.5 - 5.0 g/dL Final   AST 06/20/2021 35  15 - 41 U/L Final   ALT 06/20/2021 29  0 - 44 U/L Final   Alkaline Phosphatase 06/20/2021 38  38 - 126 U/L Final   Total Bilirubin 06/20/2021 0.6  0.3 - 1.2 mg/dL Final   GFR, Estimated 06/20/2021 >60  >60 mL/min Final   Comment: (NOTE) Calculated using the CKD-EPI Creatinine Equation (2021)    Anion gap 06/20/2021 4 (A) 5 - 15 Final   Performed at [facility], [address] [place], [place] [address]   Magnesium 06/20/2021 1.8  1.7 - 2.4 mg/dL Final   Performed at [facility], [address] [place], [place] [address]  Infusion on 06/20/2021  Component Date Value Ref Range Status   WBC 06/20/2021 2.7 (A) 4.0 - 10.5 K/uL Final   RBC 06/20/2021 3.60 (A) 4.22 - 5.81 MIL/uL Final   Hemoglobin 06/20/2021 11.0 (A) 13.0 - 17.0 g/dL Final   HCT 06/20/2021 31.9 (A) 39.0 - 52.0 % Final   MCV 06/20/2021 88.6  80.0 - 100.0 fL Final   MCH 06/20/2021 30.6  26.0 - 34.0 pg Final   MCHC 06/20/2021 34.5  30.0 - 36.0 g/dL Final   RDW 06/20/2021 13.9  11.5 - 15.5 % Final   Platelets 06/20/2021 138 (A) 150 - 400 K/uL Final   nRBC 06/20/2021 0.0  0.0 - 0.2 % Final   Neutrophils Relative % 06/20/2021 82  % Final   Neutro Abs 06/20/2021 2.2  1.7 - 7.7 K/uL Final   Lymphocytes Relative 06/20/2021 10  % Final   Lymphs Abs 06/20/2021 0.3 (A) 0.7 - 4.0 K/uL Final   Monocytes Relative 06/20/2021 7  % Final   Monocytes Absolute 06/20/2021 0.2  0.1 - 1.0 K/uL Final   Eosinophils Relative 06/20/2021 0  % Final   Eosinophils Absolute 06/20/2021 0.0  0.0 - 0.5 K/uL Final   Basophils Relative 06/20/2021 0  % Final   Basophils Absolute 06/20/2021 0.0  0.0 - 0.1 K/uL Final   Immature Granulocytes 06/20/2021 1  % Final   Abs Immature Granulocytes 06/20/2021 0.02  0.00 - 0.07 K/uL Final   Performed at [facility], [address] [place], [place] [address]    Assessment/Plan.  1. Esophageal adenocarcinoma ([place])   2. Encounter for antineoplastic chemotherapy   3. Carcinoma of base of tongue ([place])   4. Pulmonary embolism, bilateral ([place])   5. Hypomagnesemia   6. Thrombocytopenia ([facility])   7. Lymphopenia     # Clinical stage I right base of tongue carcinoma, Stage I, P16 positive.  He completed concurrent radiation and cisplatin on 01/11/2021. Patient declined PET scan and is reluctant to reschedule. 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  Proceed with weekly  carboplatin and Taxol today.  Continue concurrent radiation. Carafate and Magic mouthwash for possible radiation induced esophagitis in the near future.  # Bilateral pulmonary emboli and RLE DVT - 01/2021 Continue Eliquis 5 mg twice daily-consider eventually I plan to switch to low-dose Eliquis in September 2022  # hypomagnesia, magnesium 1.8 today recommend patient to start Slow-Mag 64 mg daily.   Magnesium has improved.  #Chemotherapy-induced leukopenia.  ANC is normal.  Continue monitor #Chemotherapy-induced thrombocytopenia, mild.  Stable I discussed the assessment and treatment plan with the patient.  The patient was provided an opportunity to ask questions and all were answered.  The patient agreed with the plan and demonstrated an understanding of the instructions.  The patient was advised to call back if the symptoms worsen or if the condition fails to improve as anticipated.  Follow up in 1 week for lab md carboplatin and taxol.    Earlie Server, MD, PhD Hematology Oncology [place] at [facility] 06/20/2021

## 2021-06-20 NOTE — Patient Instructions (Signed)
[facility]  Discharge Instructions: Thank you for choosing [facility] to provide your oncology and hematology care.  If you have a lab appointment with the [facility], please go directly to the [facility] and check in at the registration area.  Wear comfortable clothing and clothing appropriate for easy access to any Portacath or PICC line.   We strive to give you quality time with your provider. You may need to reschedule your appointment if you arrive late (15 or more minutes).  Arriving late affects you and other patients whose appointments are after yours.  Also, if you miss three or more appointments without notifying the office, you may be dismissed from the clinic at the provider's discretion.      For prescription refill requests, have your pharmacy contact our office and allow 72 hours for refills to be completed.    Today you received the following chemotherapy and/or immunotherapy agents TAXOL & CARBOPLATIN      To help prevent nausea and vomiting after your treatment, we encourage you to take your nausea medication as directed.  BELOW ARE SYMPTOMS THAT SHOULD BE REPORTED IMMEDIATELY: *FEVER GREATER THAN 100.4 F (38 C) OR HIGHER *CHILLS OR SWEATING *NAUSEA AND VOMITING THAT IS NOT CONTROLLED WITH YOUR NAUSEA MEDICATION *UNUSUAL SHORTNESS OF BREATH *UNUSUAL BRUISING OR BLEEDING *URINARY PROBLEMS (pain or burning when urinating, or frequent urination) *BOWEL PROBLEMS (unusual diarrhea, constipation, pain near the anus) TENDERNESS IN MOUTH AND THROAT WITH OR WITHOUT PRESENCE OF ULCERS (sore throat, sores in mouth, or a toothache) UNUSUAL RASH, SWELLING OR PAIN  UNUSUAL VAGINAL DISCHARGE OR ITCHING   Items with * indicate a potential emergency and should be followed up as soon as possible or go to the Emergency Department if any problems should occur.  Please show the CHEMOTHERAPY ALERT CARD or IMMUNOTHERAPY ALERT CARD at  check-in to the Emergency Department and triage nurse.  Should you have questions after your visit or need to cancel or reschedule your appointment, please contact [facility]  [phone] and follow the prompts.  Office hours are 8:00 a.m. to 4:30 p.m. Monday - Friday. Please note that voicemails left after 4:00 p.m. may not be returned until the following business day.  We are closed weekends and major holidays. You have access to a nurse at all times for urgent questions. Please call the main number to the clinic [phone] and follow the prompts.  For any non-urgent questions, you may also contact your provider using MyChart. We now offer e-Visits for anyone 37 and older to request care online for non-urgent symptoms. For details visit [email or web address].   Also download the MyChart app! Go to the app store, search "MyChart", open the app, select [facility], and log in with your MyChart username and password.  Due to Covid, a mask is required upon entering the hospital/clinic. If you do not have a mask, one will be given to you upon arrival. For doctor visits, patients may have 1 support person aged 37 or older with them. For treatment visits, patients cannot have anyone with them due to current Covid guidelines and our immunocompromised population.   Paclitaxel injection What is this medication? PACLITAXEL (PAK li TAX el) is a chemotherapy drug. It targets fast dividing cells, like cancer cells, and causes these cells to die. This medicine is used to treat ovarian cancer, breast cancer, lung cancer, Kaposi's sarcoma, andother cancers. This medicine may be used for other purposes;  ask your health care provider orpharmacist if you have questions. COMMON BRAND NAME(S): Onxol, Taxol What should I tell my care team before I take this medication? They need to know if you have any of these conditions: history of irregular heartbeat liver disease low blood  counts, like low white cell, platelet, or red cell counts lung or breathing disease, like asthma tingling of the fingers or toes, or other nerve disorder an unusual or allergic reaction to paclitaxel, alcohol, polyoxyethylated castor oil, other chemotherapy, other medicines, foods, dyes, or preservatives pregnant or trying to get pregnant breast-feeding How should I use this medication? This drug is given as an infusion into a vein. It is administered in a hospitalor clinic by a specially trained health care professional. Talk to your pediatrician regarding the use of this medicine in children.Special care may be needed. Overdosage: If you think you have taken too much of this medicine contact apoison control center or emergency room at once. NOTE: This medicine is only for you. Do not share this medicine with others. What if I miss a dose? It is important not to miss your dose. Call your doctor or health careprofessional if you are unable to keep an appointment. What may interact with this medication? Do not take this medicine with any of the following medications: live virus vaccines This medicine may also interact with the following medications: antiviral medicines for hepatitis, HIV or AIDS certain antibiotics like erythromycin and clarithromycin certain medicines for fungal infections like ketoconazole and itraconazole certain medicines for seizures like carbamazepine, phenobarbital, phenytoin gemfibrozil nefazodone rifampin [place] wort This list may not describe all possible interactions. Give your health care provider a list of all the medicines, herbs, non-prescription drugs, or dietary supplements you use. Also tell them if you smoke, drink alcohol, or use illegaldrugs. Some items may interact with your medicine. What should I watch for while using this medication? Your condition will be monitored carefully while you are receiving this medicine. You will need important blood  work done while you are taking thismedicine. This medicine can cause serious allergic reactions. To reduce your risk you will need to take other medicine(s) before treatment with this medicine. If you experience allergic reactions like skin rash, itching or hives, swelling of theface, lips, or tongue, tell your doctor or health care professional right away. In some cases, you may be given additional medicines to help with side effects.Follow all directions for their use. This drug may make you feel generally unwell. This is not uncommon, as chemotherapy can affect healthy cells as well as cancer cells. Report any side effects. Continue your course of treatment even though you feel ill unless yourdoctor tells you to stop. Call your doctor or health care professional for advice if you get a fever, chills or sore throat, or other symptoms of a cold or flu. Do not treat yourself. This drug decreases your body's ability to fight infections. Try toavoid being around people who are sick. This medicine may increase your risk to bruise or bleed. Call your doctor orhealth care professional if you notice any unusual bleeding. Be careful brushing and flossing your teeth or using a toothpick because you may get an infection or bleed more easily. If you have any dental work done,tell your dentist you are receiving this medicine. Avoid taking products that contain aspirin, acetaminophen, ibuprofen, naproxen, or ketoprofen unless instructed by your doctor. These medicines may hide afever. Do not become pregnant while taking this medicine. Women should inform their  doctor if they wish to become pregnant or think they might be pregnant. There is a potential for serious side effects to an unborn child. Talk to your health care professional or pharmacist for more information. Do not breast-feed aninfant while taking this medicine. Men are advised not to father a child while receiving this medicine. This product may contain  alcohol. Ask your pharmacist or healthcare provider if this medicine contains alcohol. Be sure to tell all healthcare providers you are taking this medicine. Certain medicines, like metronidazole and disulfiram, can cause an unpleasant reaction when taken with alcohol. The reaction includes flushing, headache, nausea, vomiting, sweating, and increased thirst. Thereaction can last from 30 minutes to several hours. What side effects may I notice from receiving this medication? Side effects that you should report to your doctor or health care professionalas soon as possible: allergic reactions like skin rash, itching or hives, swelling of the face, lips, or tongue breathing problems changes in vision fast, irregular heartbeat high or low blood pressure mouth sores pain, tingling, numbness in the hands or feet signs of decreased platelets or bleeding - bruising, pinpoint red spots on the skin, black, tarry stools, blood in the urine signs of decreased red blood cells - unusually weak or tired, feeling faint or lightheaded, falls signs of infection - fever or chills, cough, sore throat, pain or difficulty passing urine signs and symptoms of liver injury like dark yellow or brown urine; general ill feeling or flu-like symptoms; light-colored stools; loss of appetite; nausea; right upper belly pain; unusually weak or tired; yellowing of the eyes or skin swelling of the ankles, feet, hands unusually slow heartbeat Side effects that usually do not require medical attention (report to yourdoctor or health care professional if they continue or are bothersome): diarrhea hair loss loss of appetite muscle or joint pain nausea, vomiting pain, redness, or irritation at site where injected tiredness This list may not describe all possible side effects. Call your doctor for medical advice about side effects. You may report side effects to FDA at1-800-FDA-1088. Where should I keep my medication? This drug is  given in a hospital or clinic and will not be stored at home. NOTE: This sheet is a summary. It may not cover all possible information. If you have questions about this medicine, talk to your doctor, pharmacist, orhealth care provider.  2022 Elsevier/Gold Standard (2019-10-06 13:37:23)  Carboplatin injection What is this medication? CARBOPLATIN (KAR boe pla tin) is a chemotherapy drug. It targets fast dividing cells, like cancer cells, and causes these cells to die. This medicine is usedto treat ovarian cancer and many other cancers. This medicine may be used for other purposes; ask your health care provider orpharmacist if you have questions. COMMON BRAND NAME(S): Paraplatin What should I tell my care team before I take this medication? They need to know if you have any of these conditions: blood disorders hearing problems kidney disease recent or ongoing radiation therapy an unusual or allergic reaction to carboplatin, cisplatin, other chemotherapy, other medicines, foods, dyes, or preservatives pregnant or trying to get pregnant breast-feeding How should I use this medication? This drug is usually given as an infusion into a vein. It is administered in [facility] or clinic by a specially trained health care professional. Talk to your pediatrician regarding the use of this medicine in children.Special care may be needed. Overdosage: If you think you have taken too much of this medicine contact apoison control center or emergency room at once. NOTE: This medicine  is only for you. Do not share this medicine with others. What if I miss a dose? It is important not to miss a dose. Call your doctor or health careprofessional if you are unable to keep an appointment. What may interact with this medication? medicines for seizures medicines to increase blood counts like filgrastim, pegfilgrastim, sargramostim some antibiotics like amikacin, gentamicin, neomycin, streptomycin,  tobramycin vaccines Talk to your doctor or health care professional before taking any of thesemedicines: acetaminophen aspirin ibuprofen ketoprofen naproxen This list may not describe all possible interactions. Give your health care provider a list of all the medicines, herbs, non-prescription drugs, or dietary supplements you use. Also tell them if you smoke, drink alcohol, or use illegaldrugs. Some items may interact with your medicine. What should I watch for while using this medication? Your condition will be monitored carefully while you are receiving this medicine. You will need important blood work done while you are taking thismedicine. This drug may make you feel generally unwell. This is not uncommon, as chemotherapy can affect healthy cells as well as cancer cells. Report any side effects. Continue your course of treatment even though you feel ill unless yourdoctor tells you to stop. In some cases, you may be given additional medicines to help with side effects.Follow all directions for their use. Call your doctor or health care professional for advice if you get a fever, chills or sore throat, or other symptoms of a cold or flu. Do not treat yourself. This drug decreases your body's ability to fight infections. Try toavoid being around people who are sick. This medicine may increase your risk to bruise or bleed. Call your doctor orhealth care professional if you notice any unusual bleeding. Be careful brushing and flossing your teeth or using a toothpick because you may get an infection or bleed more easily. If you have any dental work done,tell your dentist you are receiving this medicine. Avoid taking products that contain aspirin, acetaminophen, ibuprofen, naproxen, or ketoprofen unless instructed by your doctor. These medicines may hide afever. Do not become pregnant while taking this medicine. Women should inform their doctor if they wish to become pregnant or think they might be  pregnant. There is a potential for serious side effects to an unborn child. Talk to your health care professional or pharmacist for more information. Do not breast-feed aninfant while taking this medicine. What side effects may I notice from receiving this medication? Side effects that you should report to your doctor or health care professionalas soon as possible: allergic reactions like skin rash, itching or hives, swelling of the face, lips, or tongue signs of infection - fever or chills, cough, sore throat, pain or difficulty passing urine signs of decreased platelets or bleeding - bruising, pinpoint red spots on the skin, black, tarry stools, nosebleeds signs of decreased red blood cells - unusually weak or tired, fainting spells, lightheadedness breathing problems changes in hearing changes in vision chest pain high blood pressure low blood counts - This drug may decrease the number of white blood cells, red blood cells and platelets. You may be at increased risk for infections and bleeding. nausea and vomiting pain, swelling, redness or irritation at the injection site pain, tingling, numbness in the hands or feet problems with balance, talking, walking trouble passing urine or change in the amount of urine Side effects that usually do not require medical attention (report to yourdoctor or health care professional if they continue or are bothersome): hair loss loss of appetite metallic   taste in the mouth or changes in taste This list may not describe all possible side effects. Call your doctor for medical advice about side effects. You may report side effects to FDA at1-800-FDA-1088. Where should I keep my medication? This drug is given in a hospital or clinic and will not be stored at home. NOTE: This sheet is a summary. It may not cover all possible information. If you have questions about this medicine, talk to your doctor, pharmacist, orhealth care provider.  2022 Elsevier/Gold  Standard (2008-02-09 14:38:05)   

## 2021-06-20 NOTE — Progress Notes (Signed)
Pt here for follow up. No new concerns voiced.   

## 2021-06-21 ENCOUNTER — Ambulatory Visit: Payer: No Typology Code available for payment source

## 2021-06-22 ENCOUNTER — Ambulatory Visit: Payer: No Typology Code available for payment source

## 2021-06-25 ENCOUNTER — Ambulatory Visit: Payer: No Typology Code available for payment source

## 2021-06-26 ENCOUNTER — Ambulatory Visit: Payer: No Typology Code available for payment source

## 2021-06-27 ENCOUNTER — Inpatient Hospital Stay: Payer: No Typology Code available for payment source

## 2021-06-27 ENCOUNTER — Ambulatory Visit
Admission: RE | Admit: 2021-06-27 | Discharge: 2021-06-27 | Disposition: A | Discharge status: Home / Self Care | Payer: No Typology Code available for payment source | Source: Ambulatory Visit | Attending: Radiation Oncology | Admitting: Radiation Oncology

## 2021-06-27 ENCOUNTER — Other Ambulatory Visit: Payer: No Typology Code available for payment source

## 2021-06-27 ENCOUNTER — Other Ambulatory Visit: Payer: Self-pay

## 2021-06-27 ENCOUNTER — Inpatient Hospital Stay (HOSPITAL_BASED_OUTPATIENT_CLINIC_OR_DEPARTMENT_OTHER): Payer: No Typology Code available for payment source | Admitting: Oncology

## 2021-06-27 ENCOUNTER — Ambulatory Visit: Payer: No Typology Code available for payment source | Admitting: Oncology

## 2021-06-27 ENCOUNTER — Encounter: Payer: Self-pay | Admitting: Oncology

## 2021-06-27 VITALS — BP 133/66 | HR 69 | Temp 96.2°F | Resp 18 | Wt 170.0 lb

## 2021-06-27 DIAGNOSIS — C01 Malignant neoplasm of base of tongue: Secondary | ICD-10-CM | POA: Diagnosis not present

## 2021-06-27 DIAGNOSIS — I2699 Other pulmonary embolism without acute cor pulmonale: Secondary | ICD-10-CM

## 2021-06-27 DIAGNOSIS — Z95828 Presence of other vascular implants and grafts: Secondary | ICD-10-CM

## 2021-06-27 DIAGNOSIS — D696 Thrombocytopenia, unspecified: Secondary | ICD-10-CM

## 2021-06-27 DIAGNOSIS — C159 Malignant neoplasm of esophagus, unspecified: Secondary | ICD-10-CM | POA: Diagnosis not present

## 2021-06-27 DIAGNOSIS — Z5111 Encounter for antineoplastic chemotherapy: Secondary | ICD-10-CM | POA: Diagnosis not present

## 2021-06-27 LAB — CBC WITH DIFFERENTIAL/PLATELET
Abs Immature Granulocytes: 0.02 10*3/uL (ref 0.00–0.07)
Basophils Absolute: 0 10*3/uL (ref 0.0–0.1)
Basophils Relative: 1 %
Eosinophils Absolute: 0 10*3/uL (ref 0.0–0.5)
Eosinophils Relative: 0 %
HCT: 31.7 % — ABNORMAL LOW (ref 39.0–52.0)
Hemoglobin: 11.1 g/dL — ABNORMAL LOW (ref 13.0–17.0)
Immature Granulocytes: 1 %
Lymphocytes Relative: 10 %
Lymphs Abs: 0.3 10*3/uL — ABNORMAL LOW (ref 0.7–4.0)
MCH: 31.4 pg (ref 26.0–34.0)
MCHC: 35 g/dL (ref 30.0–36.0)
MCV: 89.5 fL (ref 80.0–100.0)
Monocytes Absolute: 0.3 10*3/uL (ref 0.1–1.0)
Monocytes Relative: 9 %
Neutro Abs: 2.2 10*3/uL (ref 1.7–7.7)
Neutrophils Relative %: 79 %
Platelets: 139 10*3/uL — ABNORMAL LOW (ref 150–400)
RBC: 3.54 MIL/uL — ABNORMAL LOW (ref 4.22–5.81)
RDW: 14.6 % (ref 11.5–15.5)
WBC: 2.8 10*3/uL — ABNORMAL LOW (ref 4.0–10.5)
nRBC: 0 % (ref 0.0–0.2)

## 2021-06-27 LAB — COMPREHENSIVE METABOLIC PANEL
ALT: 26 U/L (ref 0–44)
AST: 30 U/L (ref 15–41)
Albumin: 4 g/dL (ref 3.5–5.0)
Alkaline Phosphatase: 35 U/L — ABNORMAL LOW (ref 38–126)
Anion gap: 7 (ref 5–15)
BUN: 15 mg/dL (ref 8–23)
CO2: 27 mmol/L (ref 22–32)
Calcium: 9 mg/dL (ref 8.9–10.3)
Chloride: 101 mmol/L (ref 98–111)
Creatinine, Ser: 0.84 mg/dL (ref 0.61–1.24)
GFR, Estimated: >60 mL/min (ref >60)
Glucose, Bld: 207 mg/dL — ABNORMAL HIGH (ref 70–99)
Potassium: 3.6 mmol/L (ref 3.5–5.1)
Sodium: 135 mmol/L (ref 135–145)
Total Bilirubin: 0.8 mg/dL (ref 0.3–1.2)
Total Protein: 6.4 g/dL — ABNORMAL LOW (ref 6.5–8.1)

## 2021-06-27 LAB — MAGNESIUM: Magnesium: 1.4 mg/dL — ABNORMAL LOW (ref 1.7–2.4)

## 2021-06-27 MED ORDER — DEXAMETHASONE SODIUM PHOSPHATE 100 MG/10ML IJ SOLN
10.0000 mg | Freq: Once | INTRAMUSCULAR | Status: AC
  Administered 2021-06-27: 10 mg via INTRAVENOUS
  Filled 2021-06-27: qty 10

## 2021-06-27 MED ORDER — SODIUM CHLORIDE 0.9% FLUSH
10.0000 mL | Freq: Once | INTRAVENOUS | Status: AC
  Administered 2021-06-27: 10 mL via INTRAVENOUS
  Filled 2021-06-27: qty 10

## 2021-06-27 MED ORDER — DIPHENHYDRAMINE HCL 50 MG/ML IJ SOLN
50.0000 mg | Freq: Once | INTRAMUSCULAR | Status: AC
  Administered 2021-06-27: 50 mg via INTRAVENOUS
  Filled 2021-06-27: qty 1

## 2021-06-27 MED ORDER — SODIUM CHLORIDE 0.9 % IV SOLN
Freq: Once | INTRAVENOUS | Status: AC
  Filled 2021-06-27: qty 250

## 2021-06-27 MED ORDER — MAGNESIUM CHLORIDE 64 MG PO TBEC: 1.0000 | DELAYED_RELEASE_TABLET | Freq: Two times a day (BID) | ORAL | 1 refills | Status: DC

## 2021-06-27 MED ORDER — SODIUM CHLORIDE 0.9 % IV SOLN
195.2000 mg | Freq: Once | INTRAVENOUS | Status: AC
  Administered 2021-06-27: 200 mg via INTRAVENOUS
  Filled 2021-06-27: qty 20

## 2021-06-27 MED ORDER — SODIUM CHLORIDE 0.9 % IV SOLN
50.0000 mg/m2 | Freq: Once | INTRAVENOUS | Status: AC
  Administered 2021-06-27: 102 mg via INTRAVENOUS
  Filled 2021-06-27: qty 17

## 2021-06-27 MED ORDER — PALONOSETRON HCL INJECTION 0.25 MG/5ML
0.2500 mg | Freq: Once | INTRAVENOUS | Status: AC
  Administered 2021-06-27: 0.25 mg via INTRAVENOUS
  Filled 2021-06-27: qty 5

## 2021-06-27 MED ORDER — HEPARIN SOD (PORK) LOCK FLUSH 100 UNIT/ML IV SOLN
500.0000 [IU] | Freq: Once | INTRAVENOUS | Status: AC | PRN
  Administered 2021-06-27: 500 [IU]
  Filled 2021-06-27: qty 5

## 2021-06-27 MED ORDER — MAGNESIUM SULFATE 4 GM/100ML IV SOLN
4.0000 g | Freq: Once | INTRAVENOUS | Status: AC
  Administered 2021-06-27: 4 g via INTRAVENOUS
  Filled 2021-06-27: qty 100

## 2021-06-27 MED ORDER — FAMOTIDINE 20 MG IN NS 100 ML IVPB
20.0000 mg | Freq: Once | INTRAVENOUS | Status: AC
  Administered 2021-06-27: 20 mg via INTRAVENOUS
  Filled 2021-06-27: qty 20
  Filled 2021-06-27: qty 100

## 2021-06-27 NOTE — Progress Notes (Signed)
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  Patient completed radiation and chemotherapy for base of tongue cancer.  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    Met with patient during infusion.  Patient reports that he is eating but has no taste.  Says that he has been drinking 3 of boost shakes daily.  Can taste the chocolate better than vanilla.  Ate 2 pieces of cheese toast with watermelon for breakfast and drank shake.  Supper last night was [place] food of brown rice, sesame chicken and vegetables and egg roll.  He and his wife split the meal and he drank shake.      Medications: reviewed  Labs: glucose 207  Anthropometrics:   Weight 170 lb increased  169 lb 14.4 oz on 8/3 190 lb on 02/20/21 after treatment for base of tongue cancer.     NUTRITION DIAGNOSIS: Inadequate oral intake stable    INTERVENTION:  Continue 350 calorie shake or higher TID Continue high calorie, high protein foods    MONITORING, EVALUATION, GOAL: weight trends, intake   NEXT VISIT: Wednesday, August 17 after radiation  Gwenda Heiner B. Zenia Resides, [place], [place] Registered Dietitian [phone] (mobile)

## 2021-06-27 NOTE — Progress Notes (Signed)
Hematology Oncology Progress Note   Clinic Day:  06/27/2021  Referring physician: Derinda Late, MD  Chief Complaint: Sean Garner. is a 75 y.o. 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  PERTINENT ONCOLOGY HISTORY Patient previously followed up by Dr.Corcoran, patient switched care to me on 06/27/21 Extensive medical record review was performed by me  Clinical stage I (T1N1Mx) right base of tongue carcinoma s/p ultrasound guided right cervical lymph node biopsy on 10/11/2020.  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    09/28/2020 Neck soft tissue CT on  revealed a 3.5 x 3.0 x 4.8 cm right neck mass most c/w enlarged lymph node. There was a 14 mm enhancing mass in the right base of tongue.   PET scan on 10/25/2020 revealed right tongue base primary with ipsilateral level 2 nodal metastasis. There were no findings of extracervical metastatic disease. There was focal hypermetabolism in the distal esophagus, with possible concurrent soft tissue density lesion. Recommended correlation with endoscopy. There was vague right lower lobe low-level hypermetabolism and ground-glass nodularity, favored minimal infection or aspiration. Incidental findings included aortic atherosclerosis, coronary artery atherosclerosis, emphysema, a tiny hiatal hernia, and prostatomegaly. 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  Speech discrimination scores were 100% in each ear.  11/03/2020 Upper endoscopy on  revealed a malignant esophageal tumor at the gastroesophageal junction. There were esophageal mucosal changes c/w short-segment Barrett's esophagus. There was a 2 cm hiatal hernia. There was gastritis.  Pathology in the esophagus at 36 cm revealed intramucosal adenocarcinoma arising in a background of high grade dysplasia  and intestinal metaplasia; pathology at 38 cm revealed adenocarcinoma.  11/15/2020 EUS on  revealed early stage adenocarcinoma arising from Barrett's esophagus.  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   11/20/2020 - 12/27/2020  6 weeks of cisplatin with concurrent radiation  completed radiation on 01/11/2021.  01/19/2021 Chest abdomen and pelvis CT with contrast on  revealed extensive nearly occlusive pulmonary thromboembolic disease bilaterally with CT evidence of right heart strain c/w at least submassive (intermediate risk) PE. There was no evidence of metastatic disease within the chest, abdomen or pelvis.  Improvement in previously demonstrated distal esophageal wall thickening without apparent focal mass lesion. There was aortic atherosclerosis and emphysema. 01/20/2021 Bilateral lower extremity duplex on  revealed occlusive DVT in the right deep femoral vein. Started on anticoagulation with heparin drip and discharged off Eliquis. Prothrombin gene mutation and factor 5 leiden were both negative  He has PTSD and clastrophobia.  Anxiety was relieved by alprazolam prior to radiation.  He is on citalopram 30 mg a day and trazodone at night.  He was admitted to [facility] from 01/19/2021 - 01/22/2021 with bilateral pulmonary emboli. Bilateral lower extremity venous duplex was positive for occlusive DVT in the right deep femoral vein. He was placed on  and discharged on Eliquis.   # Patient canceled her PET scan that was ordered for assessment of treatment response for head and neck cancer. 04/09/2021, patient was seen by Dr. Elenor Quinones and underwent EGD. Polypoid tumor was seen at the GE junction, consistent with the location of the previously visualized tumor.  With no significant difference compared to photos from other endoscopies.  The proximal esophagus is involved.  Esophagectomy will be applicable. Biopsy was taken at 38 cm.  Dr. Elenor Quinones has  reached out  radiation oncology Dr. Baruch Gouty and recommend neoadjuvant chemo and radiation.  INTERVAL HISTORY Sean Garner. is a 75 y.o. 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 Currently on concurrent chemotherapy radiation for esophageal cancer treatments.  He took a break from RT and wen to beach. He enjoys his trip.  Scratches in his throat has improved.   Overall tolerates well.  Denies any neuropathy symptoms. No nausea vomiting diarrhea fever chills.   Past Medical History:  Diagnosis Date   Anxiety    Cancer ([place])    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   Claustrophobia    Diabetes mellitus without complication ([place])    Esophageal adenocarcinoma ([place]) 04/21/2021   GERD (gastroesophageal reflux disease)    Glaucoma    using gtts for this.   Hypercholesterolemia    Hypertension     Past Surgical History:  Procedure Laterality Date   CATARACT EXTRACTION W/PHACO Left 12/03/2017   Procedure: CATARACT EXTRACTION PHACO AND INTRAOCULAR LENS PLACEMENT ([place]) COMPLICATED DIABETIC LEFT;  Surgeon: Leandrew Koyanagi, MD;  Location: [place];  Service: Ophthalmology;  Laterality: Left;  Diabetic - oral meds   CHOLECYSTECTOMY     COLONOSCOPY  2016   PORTA CATH INSERTION N/A 11/13/2020   Procedure: PORTA CATH INSERTION;  Surgeon: Algernon Huxley, MD;  Location: [place] [facility];  Service: Cardiovascular;  Laterality: N/A;    Family History  Problem Relation Age of Onset   Cancer Mother    Colon cancer Neg Hx    Colon polyps Neg Hx    Esophageal cancer Neg Hx    Rectal cancer Neg Hx    Stomach cancer Neg Hx     Social History:  reports that he quit smoking about 36 years ago. He has never used smokeless tobacco. He reports previous alcohol use. He  reports that he does not use drugs. He quit smoking cold [place] in 1986. He smoked 2 packs per day for 20 years. He drinks a glass of wine or a Margarita occasionally. 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 He was exposed to [employer]. He worked with a Geologist, engineering for 34 years. The patient is accompanied by his wife today.  Allergies: No Known Allergies  Current Medications: Current Outpatient Medications  Medication Sig Dispense Refill   APIXABAN (ELIQUIS) VTE STARTER PACK ('10MG'$  AND '5MG'$ ) Take as directed on package: start with two-'5mg'$  tablets twice daily for 7 days. On day 8 (01/29/2021), switch to one-'5mg'$  tablet twice daily. 1 tablet 0   brimonidine-timolol (COMBIGAN) 0.2-0.5 % ophthalmic solution Place 1 drop into the left eye daily.     calcium carbonate (OS-CAL) 600 MG TABS tablet Take by mouth.     citalopram (CELEXA) 20 MG tablet Take 1 tablet (20 mg total) by mouth daily. 30 tablet 1   glipiZIDE (GLUCOTROL XL) 5 MG 24 hr tablet Take 5 mg by mouth 2 (two) times daily.  1   glucose blood test strip      Melatonin 3 MG CAPS Take 5 mg by mouth.     metFORMIN (GLUCOPHAGE-XR) 500 MG 24 hr tablet Take 500 mg by mouth 2 (two) times daily. Takes 2 tablets and 1  tablet in the pm     pantoprazole (PROTONIX) 40 MG tablet Take by mouth.     simvastatin (ZOCOR) 40 MG tablet Take 40 mg by mouth daily at 6 PM.     vitamin B-12 (CYANOCOBALAMIN) 1000 MCG tablet Take 1,000 mcg by mouth daily.     VITAMIN D PO Take 800 Units by mouth daily.     ALPRAZolam (XANAX) 0.25 MG tablet Take 1 tablet (0.25 mg total) by mouth at bedtime as needed for anxiety. (Patient not taking: No sig reported) 60 tablet 0   fluticasone (FLONASE) 50 MCG/ACT nasal spray Place into both nostrils. (Patient not taking: No sig reported)     magic mouthwash w/lidocaine SOLN Take 5 mLs by mouth. (Patient not taking: No sig reported)     magnesium chloride (SLOW-MAG) 64 MG TBEC SR tablet Take 1 tablet (64 mg total)  by mouth 2 (two) times daily. 60 tablet 1   sucralfate (CARAFATE) 1 g tablet Take 0.5 tablets (0.5 g total) by mouth 3 (three) times daily. Dissolve in warm water, swish and swallow (Patient not taking: No sig reported) 60 tablet 1   traZODone (DESYREL) 50 MG tablet Take 0.5-1 tablets (25-50 mg total) by mouth at bedtime as needed for sleep. (Patient not taking: No sig reported) 30 tablet 2   Current Facility-Administered Medications  Medication Dose Route Frequency Provider Last Rate Last Admin   0.9 %  sodium chloride infusion  500 mL Intravenous Once Pyrtle, Lajuan Lines, MD       Facility-Administered Medications Ordered in Other Visits  Medication Dose Route Frequency Provider Last Rate Last Admin   0.9 % NaCl with KCl 20 mEq/ L  infusion   Intravenous Once Corcoran, Melissa C, MD       CARBOplatin (PARAPLATIN) 200 mg in sodium chloride 0.9 % 250 mL chemo infusion  200 mg Intravenous Once Earlie Server, MD 540 mL/hr at 06/27/21 1334 200 mg at 06/27/21 1334   heparin lock flush 100 unit/mL  500 Units Intracatheter Once PRN Earlie Server, MD       magnesium sulfate IVPB 2 g 50 mL  2 g Intravenous Once Corcoran, Melissa C, MD       magnesium sulfate IVPB 4 g 100 mL  4 g Intravenous Once Earlie Server, MD 50 mL/hr at 06/27/21 1154 4 g at 06/27/21 1154    Review of Systems  Constitutional: Negative.  Negative for chills, fever, malaise/fatigue and weight loss.  HENT:  Negative for congestion, ear pain and tinnitus.   Eyes: Negative.  Negative for blurred vision and double vision.  Respiratory: Negative.  Negative for cough, sputum production and shortness of breath.   Cardiovascular:  Negative for chest pain and palpitations.  Gastrointestinal: Negative.  Negative for abdominal pain, constipation, diarrhea, nausea and vomiting.  Genitourinary:  Negative for dysuria, frequency and urgency.  Musculoskeletal:  Negative for back pain and falls.  Skin: Negative.  Negative for rash.  Neurological: Negative.  Negative  for weakness and headaches.  Endo/Heme/Allergies: Negative.  Does not bruise/bleed easily.  Psychiatric/Behavioral: Negative.  Negative for depression. The patient is not nervous/anxious and does not have insomnia.   Performance status (ECOG): 1  Vitals Blood pressure 133/66, pulse 69, temperature (!) 96.2 F (35.7 C), temperature source Tympanic, resp. rate 18, weight 170 lb (77.1 kg), SpO2 99 %.   Physical Exam Constitutional:      General: He is not in acute distress.    Appearance: He is not diaphoretic.  HENT:     Head: Normocephalic and atraumatic.     Nose: Nose normal.     Mouth/Throat:     Pharynx: No oropharyngeal exudate.  Eyes:     General: No scleral icterus.    Pupils: Pupils are equal, round, and reactive to light.  Cardiovascular:     Rate and Rhythm: Normal rate and regular rhythm.     Heart sounds: No murmur heard. Pulmonary:     Effort: Pulmonary effort is normal. No respiratory distress.     Breath sounds: No rales.  Chest:     Chest wall: No tenderness.  Abdominal:     General: There is no distension.     Palpations: Abdomen is soft.     Tenderness: There is no abdominal tenderness.  Musculoskeletal:        General: Normal range of motion.     Cervical back: Normal range of motion and neck supple.  Skin:    General: Skin is warm and dry.     Findings: No erythema.  Neurological:     Mental Status: He is alert and oriented to person, place, and time.     Cranial Nerves: No cranial nerve deficit.     Motor: No abnormal muscle tone.     Coordination: Coordination normal.  Psychiatric:        Mood and Affect: Affect normal.    Infusion on 06/27/2021  Component Date Value Ref Range Status   Magnesium 06/27/2021 1.4 (A) 1.7 - 2.4 mg/dL Final   Performed at [facility], [address] [place], [place] [address]   Sodium 06/27/2021 135  135 - 145 mmol/L Final   Potassium 06/27/2021 3.6  3.5 - 5.1 mmol/L Final   Chloride 06/27/2021 101  98 -  111 mmol/L Final   CO2 06/27/2021 27  22 - 32 mmol/L Final   Glucose, Bld 06/27/2021 207 (A) 70 - 99 mg/dL Final   Glucose reference range applies only to samples taken after fasting for at least 8 hours.   BUN 06/27/2021 15  8 - 23 mg/dL Final   Creatinine, Ser 06/27/2021 0.84  0.61 - 1.24 mg/dL Final   Calcium 06/27/2021 9.0  8.9 - 10.3 mg/dL Final   Total Protein 06/27/2021 6.4 (A) 6.5 - 8.1 g/dL Final   Albumin 06/27/2021 4.0  3.5 - 5.0 g/dL Final   AST 06/27/2021 30  15 - 41 U/L Final   ALT 06/27/2021 26  0 - 44 U/L Final   Alkaline Phosphatase 06/27/2021 35 (A) 38 - 126 U/L Final   Total Bilirubin 06/27/2021 0.8  0.3 - 1.2 mg/dL Final   GFR, Estimated 06/27/2021 >60  >60 mL/min Final   Comment: (NOTE) Calculated using the CKD-EPI Creatinine Equation (2021)    Anion gap 06/27/2021 7  5 - 15 Final   Performed at [facility], [place], [place] [address]   WBC 06/27/2021 2.8 (A) 4.0 - 10.5 K/uL Final   RBC 06/27/2021 3.54 (A) 4.22 - 5.81 MIL/uL Final   Hemoglobin 06/27/2021 11.1 (A) 13.0 - 17.0 g/dL Final   HCT 06/27/2021 31.7 (A) 39.0 - 52.0 % Final   MCV 06/27/2021 89.5  80.0 - 100.0 fL Final   MCH 06/27/2021 31.4  26.0 - 34.0 pg Final   MCHC 06/27/2021 35.0  30.0 - 36.0 g/dL Final   RDW 06/27/2021 14.6  11.5 - 15.5 % Final   Platelets 06/27/2021 139 (A) 150 - 400 K/uL Final  nRBC 06/27/2021 0.0  0.0 - 0.2 % Final   Neutrophils Relative % 06/27/2021 79  % Final   Neutro Abs 06/27/2021 2.2  1.7 - 7.7 K/uL Final   Lymphocytes Relative 06/27/2021 10  % Final   Lymphs Abs 06/27/2021 0.3 (A) 0.7 - 4.0 K/uL Final   Monocytes Relative 06/27/2021 9  % Final   Monocytes Absolute 06/27/2021 0.3  0.1 - 1.0 K/uL Final   Eosinophils Relative 06/27/2021 0  % Final   Eosinophils Absolute 06/27/2021 0.0  0.0 - 0.5 K/uL Final   Basophils Relative 06/27/2021 1  % Final   Basophils Absolute 06/27/2021 0.0  0.0 - 0.1 K/uL Final   Immature Granulocytes 06/27/2021 1  %  Final   Abs Immature Granulocytes 06/27/2021 0.02  0.00 - 0.07 K/uL Final   Performed at [facility], [address] [place], [place] [address]    Assessment/Plan.  1. Esophageal adenocarcinoma ([place])   2. Carcinoma of base of tongue ([place])   3. Port-A-Cath in place   4. Encounter for antineoplastic chemotherapy   5. Pulmonary embolism, bilateral ([place])   6. Hypomagnesemia   7. Thrombocytopenia ([place])     # Clinical stage I right base of tongue carcinoma, Stage I, P16 positive.  He completed concurrent radiation and cisplatin on 01/11/2021. Patient declined PET scan previously and is reluctant to reschedule. 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  Proceed with weekly carboplatin and Taxol today.  Continue concurrent radiation. Carafate and Magic mouthwash for possible radiation induced esophagitis in the near future.  # Bilateral pulmonary emboli and RLE DVT - 01/2021 Continue Eliquis 5 mg twice daily-consider eventually I plan to switch to low-dose Eliquis in September 2022  # hypomagnesia, magnesium 1.4 today recommend patient to increased Slow-Mag 64 mg twice daily IV Magnesium 4g x 1 today  #Chemotherapy-induced leukopenia.  ANC is normal.  mainly lymphocytopenia. Continue monitor #Chemotherapy-induced thrombocytopenia, mild.  Stable  I discussed the assessment and treatment plan with the patient.  The patient was provided an opportunity to ask questions and all were answered.  The patient agreed with the plan and demonstrated an understanding of the instructions.  The patient was advised to call back if the symptoms worsen or if the condition fails to improve as anticipated.  Follow up in 1 week for lab md carboplatin and taxol.    Earlie Server, MD, PhD Hematology Oncology [place] at [facility] 06/27/2021

## 2021-06-27 NOTE — Patient Instructions (Signed)
[facility] ONCOLOGY  Discharge Instructions: Thank you for choosing [facility] to provide your oncology and hematology care.  If you have a lab appointment with the [facility], please go directly to the [facility] and check in at the registration area.  Wear comfortable clothing and clothing appropriate for easy access to any Portacath or PICC line.   We strive to give you quality time with your provider. You may need to reschedule your appointment if you arrive late (15 or more minutes).  Arriving late affects you and other patients whose appointments are after yours.  Also, if you miss three or more appointments without notifying the office, you may be dismissed from the clinic at the provider's discretion.      For prescription refill requests, have your pharmacy contact our office and allow 72 hours for refills to be completed.    Today you received the following chemotherapy and/or immunotherapy agents Taxol and Carboplatin       To help prevent nausea and vomiting after your treatment, we encourage you to take your nausea medication as directed.  BELOW ARE SYMPTOMS THAT SHOULD BE REPORTED IMMEDIATELY: *FEVER GREATER THAN 100.4 F (38 C) OR HIGHER *CHILLS OR SWEATING *NAUSEA AND VOMITING THAT IS NOT CONTROLLED WITH YOUR NAUSEA MEDICATION *UNUSUAL SHORTNESS OF BREATH *UNUSUAL BRUISING OR BLEEDING *URINARY PROBLEMS (pain or burning when urinating, or frequent urination) *BOWEL PROBLEMS (unusual diarrhea, constipation, pain near the anus) TENDERNESS IN MOUTH AND THROAT WITH OR WITHOUT PRESENCE OF ULCERS (sore throat, sores in mouth, or a toothache) UNUSUAL RASH, SWELLING OR PAIN  UNUSUAL VAGINAL DISCHARGE OR ITCHING   Items with * indicate a potential emergency and should be followed up as soon as possible or go to the Emergency Department if any problems should occur.  Please show the CHEMOTHERAPY ALERT CARD or IMMUNOTHERAPY ALERT CARD  at check-in to the Emergency Department and triage nurse.  Should you have questions after your visit or need to cancel or reschedule your appointment, please contact [facility]  [phone] and follow the prompts.  Office hours are 8:00 a.m. to 4:30 p.m. Monday - Friday. Please note that voicemails left after 4:00 p.m. may not be returned until the following business day.  We are closed weekends and major holidays. You have access to a nurse at all times for urgent questions. Please call the main number to the clinic [phone] and follow the prompts.  For any non-urgent questions, you may also contact your provider using MyChart. We now offer e-Visits for anyone 3 and older to request care online for non-urgent symptoms. For details visit [email or web address].   Also download the MyChart app! Go to the app store, search "MyChart", open the app, select English, and log in with your MyChart username and password.  Due to Covid, a mask is required upon entering the hospital/clinic. If you do not have a mask, one will be given to you upon arrival. For doctor visits, patients may have 1 support person aged 16 or older with them. For treatment visits, patients cannot have anyone with them due to current Covid guidelines and our immunocompromised population.     Hypomagnesemia Hypomagnesemia is a condition in which the level of magnesium in the blood is low. Magnesium is a mineral that is found in many foods. It is used in many different processes in the body. Hypomagnesemia can affect every organ in thebody. In severe cases, it can cause life-threatening  problems. What are the causes? This condition may be caused by: Not getting enough magnesium in your diet. Malnutrition. Problems with absorbing magnesium from the intestines. Dehydration. Alcohol abuse. Vomiting. Severe or chronic diarrhea. Some medicines, including medicines that make you urinate  more (diuretics). Certain diseases, such as kidney disease, diabetes, celiac disease, and overactive thyroid. What are the signs or symptoms? Symptoms of this condition include: Loss of appetite. Nausea and vomiting. Involuntary shaking or trembling of a body part (tremor). Muscle weakness. Tingling in the arms and legs. Sudden tightening of muscles (muscle spasms). Confusion. Psychiatric issues, such as depression, irritability, or psychosis. A feeling of fluttering of the heart. Seizures. These symptoms are more severe if magnesium levels drop suddenly. How is this diagnosed? This condition may be diagnosed based on: Your symptoms and medical history. A physical exam. Blood and urine tests. How is this treated? Treatment depends on the cause and the severity of the condition. It may be treated with: A magnesium supplement. This can be taken in pill form. If the condition is severe, magnesium is usually given through an IV. Changes to your diet. You may be directed to eat foods that have a lot of magnesium, such as green leafy vegetables, peas, beans, and nuts. Stopping any intake of alcohol. Follow these instructions at home:     Make sure that your diet includes foods with magnesium. Foods that have a lot of magnesium in them include: Green leafy vegetables, such as spinach and broccoli. Beans and peas. Nuts and seeds, such as almonds and sunflower seeds. Whole grains, such as whole grain bread and fortified cereals. Take magnesium supplements if your health care provider tells you to do that. Take them as directed. Take over-the-counter and prescription medicines only as told by your health care provider. Have your magnesium levels monitored as told by your health care provider. When you are active, drink fluids that contain electrolytes. Avoid drinking alcohol. Keep all follow-up visits as told by your health care provider. This is important. Contact a health care  provider if: You get worse instead of better. Your symptoms return. Get help right away if you: Develop severe muscle weakness. Have trouble breathing. Feel that your heart is racing. Summary Hypomagnesemia is a condition in which the level of magnesium in the blood is low. Hypomagnesemia can affect every organ in the body. Treatment may include eating more foods that contain magnesium, taking magnesium supplements, and not drinking alcohol. Have your magnesium levels monitored as told by your health care provider. This information is not intended to replace advice given to you by your health care provider. Make sure you discuss any questions you have with your healthcare provider. Document Revised: 04/06/2020 Document Reviewed: 04/06/2020 Elsevier Patient Education  [place].

## 2021-06-28 ENCOUNTER — Ambulatory Visit
Admission: RE | Admit: 2021-06-28 | Discharge: 2021-06-28 | Disposition: A | Discharge status: Home / Self Care | Payer: No Typology Code available for payment source | Source: Ambulatory Visit | Attending: Radiation Oncology | Admitting: Radiation Oncology

## 2021-06-28 DIAGNOSIS — C01 Malignant neoplasm of base of tongue: Secondary | ICD-10-CM | POA: Diagnosis not present

## 2021-06-29 ENCOUNTER — Ambulatory Visit
Admission: RE | Admit: 2021-06-29 | Discharge: 2021-06-29 | Disposition: A | Discharge status: Home / Self Care | Payer: No Typology Code available for payment source | Source: Ambulatory Visit | Attending: Radiation Oncology | Admitting: Radiation Oncology

## 2021-06-29 DIAGNOSIS — C01 Malignant neoplasm of base of tongue: Secondary | ICD-10-CM | POA: Diagnosis not present

## 2021-07-02 ENCOUNTER — Ambulatory Visit
Admission: RE | Admit: 2021-07-02 | Discharge: 2021-07-02 | Disposition: A | Discharge status: Home / Self Care | Payer: No Typology Code available for payment source | Source: Ambulatory Visit | Attending: Radiation Oncology | Admitting: Radiation Oncology

## 2021-07-02 DIAGNOSIS — C01 Malignant neoplasm of base of tongue: Secondary | ICD-10-CM | POA: Diagnosis not present

## 2021-07-03 ENCOUNTER — Ambulatory Visit
Admission: RE | Admit: 2021-07-03 | Discharge: 2021-07-03 | Disposition: A | Discharge status: Home / Self Care | Payer: No Typology Code available for payment source | Source: Ambulatory Visit | Attending: Radiation Oncology | Admitting: Radiation Oncology

## 2021-07-03 ENCOUNTER — Inpatient Hospital Stay: Payer: No Typology Code available for payment source

## 2021-07-03 ENCOUNTER — Ambulatory Visit: Payer: No Typology Code available for payment source

## 2021-07-03 DIAGNOSIS — C01 Malignant neoplasm of base of tongue: Secondary | ICD-10-CM | POA: Diagnosis not present

## 2021-07-04 ENCOUNTER — Inpatient Hospital Stay: Payer: No Typology Code available for payment source

## 2021-07-04 ENCOUNTER — Other Ambulatory Visit: Payer: Self-pay

## 2021-07-04 ENCOUNTER — Ambulatory Visit
Admission: RE | Admit: 2021-07-04 | Discharge: 2021-07-04 | Disposition: A | Discharge status: Home / Self Care | Payer: No Typology Code available for payment source | Source: Ambulatory Visit | Attending: Radiation Oncology | Admitting: Radiation Oncology

## 2021-07-04 ENCOUNTER — Encounter: Payer: Self-pay | Admitting: Oncology

## 2021-07-04 ENCOUNTER — Inpatient Hospital Stay (HOSPITAL_BASED_OUTPATIENT_CLINIC_OR_DEPARTMENT_OTHER): Payer: No Typology Code available for payment source | Admitting: Oncology

## 2021-07-04 VITALS — BP 118/67 | HR 58 | Temp 98.3°F | Resp 18 | Wt 169.7 lb

## 2021-07-04 VITALS — BP 152/82 | HR 62 | Resp 18

## 2021-07-04 DIAGNOSIS — C159 Malignant neoplasm of esophagus, unspecified: Secondary | ICD-10-CM | POA: Diagnosis not present

## 2021-07-04 DIAGNOSIS — C01 Malignant neoplasm of base of tongue: Secondary | ICD-10-CM | POA: Diagnosis not present

## 2021-07-04 DIAGNOSIS — D696 Thrombocytopenia, unspecified: Secondary | ICD-10-CM

## 2021-07-04 DIAGNOSIS — Z95828 Presence of other vascular implants and grafts: Secondary | ICD-10-CM

## 2021-07-04 DIAGNOSIS — Z5111 Encounter for antineoplastic chemotherapy: Secondary | ICD-10-CM | POA: Diagnosis not present

## 2021-07-04 DIAGNOSIS — I2699 Other pulmonary embolism without acute cor pulmonale: Secondary | ICD-10-CM

## 2021-07-04 LAB — COMPREHENSIVE METABOLIC PANEL
ALT: 27 U/L (ref 0–44)
AST: 29 U/L (ref 15–41)
Albumin: 3.8 g/dL (ref 3.5–5.0)
Alkaline Phosphatase: 36 U/L — ABNORMAL LOW (ref 38–126)
Anion gap: 6 (ref 5–15)
BUN: 12 mg/dL (ref 8–23)
CO2: 27 mmol/L (ref 22–32)
Calcium: 9 mg/dL (ref 8.9–10.3)
Chloride: 101 mmol/L (ref 98–111)
Creatinine, Ser: 0.74 mg/dL (ref 0.61–1.24)
GFR, Estimated: >60 mL/min (ref >60)
Glucose, Bld: 122 mg/dL — ABNORMAL HIGH (ref 70–99)
Potassium: 3.9 mmol/L (ref 3.5–5.1)
Sodium: 134 mmol/L — ABNORMAL LOW (ref 135–145)
Total Bilirubin: 0.7 mg/dL (ref 0.3–1.2)
Total Protein: 6.2 g/dL — ABNORMAL LOW (ref 6.5–8.1)

## 2021-07-04 LAB — CBC WITH DIFFERENTIAL/PLATELET
Abs Immature Granulocytes: 0.04 10*3/uL (ref 0.00–0.07)
Basophils Absolute: 0 10*3/uL (ref 0.0–0.1)
Basophils Relative: 1 %
Eosinophils Absolute: 0 10*3/uL (ref 0.0–0.5)
Eosinophils Relative: 0 %
HCT: 29.9 % — ABNORMAL LOW (ref 39.0–52.0)
Hemoglobin: 10.3 g/dL — ABNORMAL LOW (ref 13.0–17.0)
Immature Granulocytes: 2 %
Lymphocytes Relative: 10 %
Lymphs Abs: 0.2 10*3/uL — ABNORMAL LOW (ref 0.7–4.0)
MCH: 30.9 pg (ref 26.0–34.0)
MCHC: 34.4 g/dL (ref 30.0–36.0)
MCV: 89.8 fL (ref 80.0–100.0)
Monocytes Absolute: 0.2 10*3/uL (ref 0.1–1.0)
Monocytes Relative: 8 %
Neutro Abs: 1.9 10*3/uL (ref 1.7–7.7)
Neutrophils Relative %: 79 %
Platelets: 139 10*3/uL — ABNORMAL LOW (ref 150–400)
RBC: 3.33 MIL/uL — ABNORMAL LOW (ref 4.22–5.81)
RDW: 15.1 % (ref 11.5–15.5)
WBC: 2.5 10*3/uL — ABNORMAL LOW (ref 4.0–10.5)
nRBC: 0 % (ref 0.0–0.2)

## 2021-07-04 LAB — MAGNESIUM: Magnesium: 1.8 mg/dL (ref 1.7–2.4)

## 2021-07-04 MED ORDER — SODIUM CHLORIDE 0.9 % IV SOLN
10.0000 mg | Freq: Once | INTRAVENOUS | Status: AC
  Administered 2021-07-04: 10 mg via INTRAVENOUS
  Filled 2021-07-04: qty 10

## 2021-07-04 MED ORDER — HEPARIN SOD (PORK) LOCK FLUSH 100 UNIT/ML IV SOLN
500.0000 [IU] | Freq: Once | INTRAVENOUS | Status: AC
  Administered 2021-07-04: 500 [IU] via INTRAVENOUS
  Filled 2021-07-04: qty 5

## 2021-07-04 MED ORDER — DIPHENHYDRAMINE HCL 50 MG/ML IJ SOLN
50.0000 mg | Freq: Once | INTRAMUSCULAR | Status: AC
  Administered 2021-07-04: 50 mg via INTRAVENOUS
  Filled 2021-07-04: qty 1

## 2021-07-04 MED ORDER — HEPARIN SOD (PORK) LOCK FLUSH 100 UNIT/ML IV SOLN
500.0000 [IU] | Freq: Once | INTRAVENOUS | Status: DC | PRN
  Filled 2021-07-04: qty 5

## 2021-07-04 MED ORDER — PALONOSETRON HCL INJECTION 0.25 MG/5ML
0.2500 mg | Freq: Once | INTRAVENOUS | Status: AC
  Administered 2021-07-04: 0.25 mg via INTRAVENOUS
  Filled 2021-07-04: qty 5

## 2021-07-04 MED ORDER — FAMOTIDINE 20 MG IN NS 100 ML IVPB
20.0000 mg | Freq: Once | INTRAVENOUS | Status: AC
  Administered 2021-07-04: 20 mg via INTRAVENOUS
  Filled 2021-07-04: qty 20

## 2021-07-04 MED ORDER — SODIUM CHLORIDE 0.9 % IV SOLN
195.2000 mg | Freq: Once | INTRAVENOUS | Status: AC
  Administered 2021-07-04: 200 mg via INTRAVENOUS
  Filled 2021-07-04: qty 20

## 2021-07-04 MED ORDER — HEPARIN SOD (PORK) LOCK FLUSH 100 UNIT/ML IV SOLN
INTRAVENOUS | Status: AC
  Filled 2021-07-04: qty 5

## 2021-07-04 MED ORDER — SODIUM CHLORIDE 0.9% FLUSH
10.0000 mL | Freq: Once | INTRAVENOUS | Status: AC
  Administered 2021-07-04: 10 mL via INTRAVENOUS
  Filled 2021-07-04: qty 10

## 2021-07-04 MED ORDER — SODIUM CHLORIDE 0.9 % IV SOLN
Freq: Once | INTRAVENOUS | Status: AC
  Filled 2021-07-04: qty 250

## 2021-07-04 MED ORDER — SODIUM CHLORIDE 0.9 % IV SOLN
50.0000 mg/m2 | Freq: Once | INTRAVENOUS | Status: AC
  Administered 2021-07-04: 102 mg via INTRAVENOUS
  Filled 2021-07-04: qty 17

## 2021-07-04 NOTE — Progress Notes (Signed)
Hematology Oncology Progress Note   Clinic Day:  07/04/2021  Referring physician: Derinda Late, MD  Chief Complaint: Sean Garner. is a 75 y.o. 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  PERTINENT ONCOLOGY HISTORY Patient previously followed up by Dr.Corcoran, patient switched care to me on 07/04/21 Extensive medical record review was performed by me  Clinical stage I (T1N1Mx) right base of tongue carcinoma s/p ultrasound guided right cervical lymph node biopsy on 10/11/2020.  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    09/28/2020 Neck soft tissue CT on  revealed a 3.5 x 3.0 x 4.8 cm right neck mass most c/w enlarged lymph node. There was a 14 mm enhancing mass in the right base of tongue.   PET scan on 10/25/2020 revealed right tongue base primary with ipsilateral level 2 nodal metastasis. There were no findings of extracervical metastatic disease. There was focal hypermetabolism in the distal esophagus, with possible concurrent soft tissue density lesion. Recommended correlation with endoscopy. There was vague right lower lobe low-level hypermetabolism and ground-glass nodularity, favored minimal infection or aspiration. Incidental findings included aortic atherosclerosis, coronary artery atherosclerosis, emphysema, a tiny hiatal hernia, and prostatomegaly. 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  Speech discrimination scores were 100% in each ear.  11/03/2020 Upper endoscopy on  revealed a malignant esophageal tumor at the gastroesophageal junction. There were esophageal mucosal changes c/w short-segment Barrett's esophagus. There was a 2 cm hiatal hernia. There was gastritis.  Pathology in the esophagus at 36 cm revealed intramucosal adenocarcinoma arising in a background of high grade dysplasia  and intestinal metaplasia; pathology at 38 cm revealed adenocarcinoma.  11/15/2020 EUS on  revealed early stage adenocarcinoma arising from Barrett's esophagus.  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   11/20/2020 - 12/27/2020  6 weeks of cisplatin with concurrent radiation  completed radiation on 01/11/2021.  01/19/2021 Chest abdomen and pelvis CT with contrast on  revealed extensive nearly occlusive pulmonary thromboembolic disease bilaterally with CT evidence of right heart strain c/w at least submassive (intermediate risk) PE. There was no evidence of metastatic disease within the chest, abdomen or pelvis.  Improvement in previously demonstrated distal esophageal wall thickening without apparent focal mass lesion. There was aortic atherosclerosis and emphysema. 01/20/2021 Bilateral lower extremity duplex on  revealed occlusive DVT in the right deep femoral vein. Started on anticoagulation with heparin drip and discharged off Eliquis. Prothrombin gene mutation and factor 5 leiden were both negative  He has PTSD and clastrophobia.  Anxiety was relieved by alprazolam prior to radiation.  He is on citalopram 30 mg a day and trazodone at night.  He was admitted to [facility] from 01/19/2021 - 01/22/2021 with bilateral pulmonary emboli. Bilateral lower extremity venous duplex was positive for occlusive DVT in the right deep femoral vein. He was placed on  and discharged on Eliquis.   # Patient canceled her PET scan that was ordered for assessment of treatment response for head and neck cancer. 04/09/2021, patient was seen by Dr. Elenor Quinones and underwent EGD. Polypoid tumor was seen at the GE junction, consistent with the location of the previously visualized tumor.  With no significant difference compared to photos from other endoscopies.  The proximal esophagus is involved.  Esophagectomy will be applicable. Biopsy was taken at 38 cm.  Dr. Elenor Quinones has  reached out  radiation oncology Dr. Baruch Gouty and recommend neoadjuvant chemo and radiation.  INTERVAL HISTORY Sean Garner. is a 75 y.o. 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 Currently on concurrent chemotherapy radiation for esophageal cancer treatments.  Overall patient tolerates well. No significant dysphagia/odynophagia symptoms.  Accompanied by wife.   Past Medical History:  Diagnosis Date   Anxiety    Cancer ([place])    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   Claustrophobia    Diabetes mellitus without complication ([place])    Esophageal adenocarcinoma ([place]) 04/21/2021   GERD (gastroesophageal reflux disease)    Glaucoma    using gtts for this.   Hypercholesterolemia    Hypertension     Past Surgical History:  Procedure Laterality Date   CATARACT EXTRACTION W/PHACO Left 12/03/2017   Procedure: CATARACT EXTRACTION PHACO AND INTRAOCULAR LENS PLACEMENT ([place]) COMPLICATED DIABETIC LEFT;  Surgeon: Leandrew Koyanagi, MD;  Location: [place];  Service: Ophthalmology;  Laterality: Left;  Diabetic - oral meds   CHOLECYSTECTOMY     COLONOSCOPY  2016   PORTA CATH INSERTION N/A 11/13/2020   Procedure: PORTA CATH INSERTION;  Surgeon: Algernon Huxley, MD;  Location: [place] [facility];  Service: Cardiovascular;  Laterality: N/A;    Family History  Problem Relation Age of Onset   Cancer Mother    Colon cancer Neg Hx    Colon polyps Neg Hx    Esophageal cancer Neg Hx    Rectal cancer Neg Hx    Stomach cancer Neg Hx     Social History:  reports that he quit smoking about 36 years ago. He has never used smokeless tobacco. He reports that he does not currently use alcohol. He reports that he does not use drugs. He quit smoking cold [place] in 1986. He smoked 2  packs per day for 20 years. He drinks a glass of wine or a Margarita occasionally. 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 He was exposed to [employer]. He worked with a Geologist, engineering for 34 years. The patient is accompanied by his wife today.  Allergies: No Known Allergies  Current Medications: Current Outpatient Medications  Medication Sig Dispense Refill   APIXABAN (ELIQUIS) VTE STARTER PACK ('10MG'$  AND '5MG'$ ) Take as directed on package: start with two-'5mg'$  tablets twice daily for 7 days. On day 8 (01/29/2021), switch to one-'5mg'$  tablet twice daily. 1 tablet 0   brimonidine-timolol (COMBIGAN) 0.2-0.5 % ophthalmic solution Place 1 drop into the left eye daily.     calcium carbonate (OS-CAL) 600 MG TABS tablet Take by mouth.     citalopram (CELEXA) 20 MG tablet Take 1 tablet (20 mg total) by mouth daily. 30 tablet 1   glipiZIDE (GLUCOTROL XL) 5 MG 24 hr tablet Take 5 mg by mouth 2 (two) times daily.  1   glucose blood test strip      magnesium chloride (SLOW-MAG) 64 MG TBEC SR tablet Take 1 tablet (64 mg total) by mouth 2 (two) times daily. 60 tablet 1   Melatonin 3 MG CAPS Take 5 mg by mouth.     metFORMIN (GLUCOPHAGE-XR) 500 MG 24 hr tablet Take 500 mg by mouth 2 (two) times daily. Takes  2 tablets and 1 tablet in the pm     pantoprazole (PROTONIX) 40 MG tablet Take by mouth.     simvastatin (ZOCOR) 40 MG tablet Take 40 mg by mouth daily at 6 PM.     vitamin B-12 (CYANOCOBALAMIN) 1000 MCG tablet Take 1,000 mcg by mouth daily.     VITAMIN D PO Take 800 Units by mouth daily.     ALPRAZolam (XANAX) 0.25 MG tablet Take 1 tablet (0.25 mg total) by mouth at bedtime as needed for anxiety. (Patient not taking: No sig reported) 60 tablet 0   fluticasone (FLONASE) 50 MCG/ACT nasal spray Place into both nostrils. (Patient not taking: Reported on 07/04/2021)     magic mouthwash w/lidocaine SOLN Take 5 mLs by mouth. (Patient not taking: No sig reported)     sucralfate (CARAFATE) 1 g tablet  Take 0.5 tablets (0.5 g total) by mouth 3 (three) times daily. Dissolve in warm water, swish and swallow (Patient not taking: No sig reported) 60 tablet 1   traZODone (DESYREL) 50 MG tablet Take 0.5-1 tablets (25-50 mg total) by mouth at bedtime as needed for sleep. (Patient not taking: No sig reported) 30 tablet 2   Current Facility-Administered Medications  Medication Dose Route Frequency Provider Last Rate Last Admin   0.9 %  sodium chloride infusion  500 mL Intravenous Once Pyrtle, Lajuan Lines, MD       Facility-Administered Medications Ordered in Other Visits  Medication Dose Route Frequency Provider Last Rate Last Admin   0.9 % NaCl with KCl 20 mEq/ L  infusion   Intravenous Once Corcoran, Melissa C, MD       heparin lock flush 100 UNIT/ML injection            heparin lock flush 100 unit/mL  500 Units Intracatheter Once PRN Earlie Server, MD       magnesium sulfate IVPB 2 g 50 mL  2 g Intravenous Once Lequita Asal, MD        Review of Systems  Constitutional: Negative.  Negative for chills, fever, malaise/fatigue and weight loss.  HENT:  Negative for congestion, ear pain and tinnitus.   Eyes: Negative.  Negative for blurred vision and double vision.  Respiratory: Negative.  Negative for cough, sputum production and shortness of breath.   Cardiovascular:  Negative for chest pain and palpitations.  Gastrointestinal: Negative.  Negative for abdominal pain, constipation, diarrhea, nausea and vomiting.  Genitourinary:  Negative for dysuria, frequency and urgency.  Musculoskeletal:  Negative for back pain and falls.  Skin: Negative.  Negative for rash.  Neurological: Negative.  Negative for weakness and headaches.  Endo/Heme/Allergies: Negative.  Does not bruise/bleed easily.  Psychiatric/Behavioral: Negative.  Negative for depression. The patient is not nervous/anxious and does not have insomnia.   Performance status (ECOG): 1  Vitals Blood pressure 118/67, pulse (!) 58, temperature 98.3  F (36.8 C), resp. rate 18, weight 169 lb 11.2 oz (77 kg).   Physical Exam Constitutional:      General: He is not in acute distress.    Appearance: He is not diaphoretic.  HENT:     Head: Normocephalic and atraumatic.     Nose: Nose normal.     Mouth/Throat:     Pharynx: No oropharyngeal exudate.  Eyes:     General: No scleral icterus.    Pupils: Pupils are equal, round, and reactive to light.  Cardiovascular:     Rate and Rhythm: Normal rate and regular rhythm.  Heart sounds: No murmur heard. Pulmonary:     Effort: Pulmonary effort is normal. No respiratory distress.     Breath sounds: No rales.  Chest:     Chest wall: No tenderness.  Abdominal:     General: There is no distension.     Palpations: Abdomen is soft.     Tenderness: There is no abdominal tenderness.  Musculoskeletal:        General: Normal range of motion.     Cervical back: Normal range of motion and neck supple.  Skin:    General: Skin is warm and dry.     Findings: No erythema.  Neurological:     Mental Status: He is alert and oriented to person, place, and time.     Cranial Nerves: No cranial nerve deficit.     Motor: No abnormal muscle tone.     Coordination: Coordination normal.  Psychiatric:        Mood and Affect: Affect normal.    Infusion on 07/04/2021  Component Date Value Ref Range Status   Magnesium 07/04/2021 1.8  1.7 - 2.4 mg/dL Final   Performed at [facility], [address] [place], [place] [address]   Sodium 07/04/2021 134 (A) 135 - 145 mmol/L Final   Potassium 07/04/2021 3.9  3.5 - 5.1 mmol/L Final   Chloride 07/04/2021 101  98 - 111 mmol/L Final   CO2 07/04/2021 27  22 - 32 mmol/L Final   Glucose, Bld 07/04/2021 122 (A) 70 - 99 mg/dL Final   Glucose reference range applies only to samples taken after fasting for at least 8 hours.   BUN 07/04/2021 12  8 - 23 mg/dL Final   Creatinine, Ser 07/04/2021 0.74  0.61 - 1.24 mg/dL Final   Calcium 07/04/2021 9.0  8.9 - 10.3  mg/dL Final   Total Protein 07/04/2021 6.2 (A) 6.5 - 8.1 g/dL Final   Albumin 07/04/2021 3.8  3.5 - 5.0 g/dL Final   AST 07/04/2021 29  15 - 41 U/L Final   ALT 07/04/2021 27  0 - 44 U/L Final   Alkaline Phosphatase 07/04/2021 36 (A) 38 - 126 U/L Final   Total Bilirubin 07/04/2021 0.7  0.3 - 1.2 mg/dL Final   GFR, Estimated 07/04/2021 >60  >60 mL/min Final   Comment: (NOTE) Calculated using the CKD-EPI Creatinine Equation (2021)    Anion gap 07/04/2021 6  5 - 15 Final   Performed at [facility], [place], [place] [address]   WBC 07/04/2021 2.5 (A) 4.0 - 10.5 K/uL Final   RBC 07/04/2021 3.33 (A) 4.22 - 5.81 MIL/uL Final   Hemoglobin 07/04/2021 10.3 (A) 13.0 - 17.0 g/dL Final   HCT 07/04/2021 29.9 (A) 39.0 - 52.0 % Final   MCV 07/04/2021 89.8  80.0 - 100.0 fL Final   MCH 07/04/2021 30.9  26.0 - 34.0 pg Final   MCHC 07/04/2021 34.4  30.0 - 36.0 g/dL Final   RDW 07/04/2021 15.1  11.5 - 15.5 % Final   Platelets 07/04/2021 139 (A) 150 - 400 K/uL Final   nRBC 07/04/2021 0.0  0.0 - 0.2 % Final   Neutrophils Relative % 07/04/2021 79  % Final   Neutro Abs 07/04/2021 1.9  1.7 - 7.7 K/uL Final   Lymphocytes Relative 07/04/2021 10  % Final   Lymphs Abs 07/04/2021 0.2 (A) 0.7 - 4.0 K/uL Final   Monocytes Relative 07/04/2021 8  % Final   Monocytes Absolute 07/04/2021 0.2  0.1 -  1.0 K/uL Final   Eosinophils Relative 07/04/2021 0  % Final   Eosinophils Absolute 07/04/2021 0.0  0.0 - 0.5 K/uL Final   Basophils Relative 07/04/2021 1  % Final   Basophils Absolute 07/04/2021 0.0  0.0 - 0.1 K/uL Final   Immature Granulocytes 07/04/2021 2  % Final   Abs Immature Granulocytes 07/04/2021 0.04  0.00 - 0.07 K/uL Final   Performed at [facility], [address] [place], [place] [address]    Assessment/Plan.  1. Esophageal adenocarcinoma ([place])   2. Hypomagnesemia   3. Port-A-Cath in place   4. Carcinoma of base of tongue ([place])   5. Encounter for antineoplastic  chemotherapy   6. Pulmonary embolism, bilateral ([place])   7. Thrombocytopenia ([place])     # Clinical stage I right base of tongue carcinoma, Stage I, P16 positive.  He completed concurrent radiation and cisplatin on 01/11/2021. Patient declined PET scan previously and is reluctant to reschedule. 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  Proceed with weekly carboplatin and Taxol today. Last radiation is 07/09/2021 Obtain PET scan in 10 weeks. Will discuss with RN navigator Kristi to coordinate repeat EGD by Dr. Elenor Quinones at [facility].  # Bilateral pulmonary emboli and RLE DVT - 01/2021 Continue Eliquis 5 mg twice daily-consider eventually I plan to switch to low-dose Eliquis at next visit.  # hypomagnesia, magnesium has normalized.  Continue Slow-Mag 1 tablet/day.  #Chemotherapy-induced leukopenia.  ANC is normal.  mainly lymphocytopenia. Continue monitor #Chemotherapy-induced thrombocytopenia, mild.  Stable  I discussed the assessment and treatment plan with the patient.  The patient was provided an opportunity to ask questions and all were answered.  The patient agreed with the plan and demonstrated an understanding of the instructions.  The patient was advised to call back if the symptoms worsen or if the condition fails to improve as anticipated.  Follow up after PET scan   Earlie Server, MD, PhD Hematology Oncology [place] at [facility] 07/04/2021

## 2021-07-04 NOTE — Progress Notes (Signed)
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  Now receiving chemotherapy and radiation for esophageal cancer.   Met with patient during last chemotherapy treatment.  Patient reports that his taste is off. Continues to drinking shakes 3 times per day.  Had 2 biscuits with sausage this am and watermelon. Recently ate country style steak with gravy, mashed potatoes, cooked apples and peas.    Medications: reviewed  Labs: glucose 122  Anthropometrics:   Weight 169 lb 11 oz on 8/17  170 lb on 8/10  169 lb 14.4 oz on 8/3 190 lb on 02/20/21 after treatment on base of tongue cancer    NUTRITION DIAGNOSIS: Inadequate oral intake stable   INTERVENTION:  Continue 350 calorie shake or higher TID Continue high calorie, high protein foods to maintain weight     MONITORING, EVALUATION, GOAL: weight trends, intake   NEXT VISIT: phone f/u in ~4 weeks  Betzabeth Derringer B. Zenia Resides, [place], [place] Registered Dietitian [phone] (mobile)

## 2021-07-04 NOTE — Progress Notes (Signed)
Pt here for follow up. No new concerns voiced.   

## 2021-07-04 NOTE — Progress Notes (Signed)
Patient tolerated Carbo-taxol infusion well today, no concerns voiced. No IV Mg given per MD. Patient discharged. Stable.

## 2021-07-04 NOTE — Patient Instructions (Signed)
[facility]  Discharge Instructions: Thank you for choosing [facility] to provide your oncology and hematology care.  If you have a lab appointment with the [facility], please go directly to the [facility] and check in at the registration area.  Wear comfortable clothing and clothing appropriate for easy access to any Portacath or PICC line.   We strive to give you quality time with your provider. You may need to reschedule your appointment if you arrive late (15 or more minutes).  Arriving late affects you and other patients whose appointments are after yours.  Also, if you miss three or more appointments without notifying the office, you may be dismissed from the clinic at the provider's discretion.      For prescription refill requests, have your pharmacy contact our office and allow 72 hours for refills to be completed.    Today you received the following chemotherapy and/or immunotherapy agents Carbo/Taxol   To help prevent nausea and vomiting after your treatment, we encourage you to take your nausea medication as directed.  BELOW ARE SYMPTOMS THAT SHOULD BE REPORTED IMMEDIATELY: *FEVER GREATER THAN 100.4 F (38 C) OR HIGHER *CHILLS OR SWEATING *NAUSEA AND VOMITING THAT IS NOT CONTROLLED WITH YOUR NAUSEA MEDICATION *UNUSUAL SHORTNESS OF BREATH *UNUSUAL BRUISING OR BLEEDING *URINARY PROBLEMS (pain or burning when urinating, or frequent urination) *BOWEL PROBLEMS (unusual diarrhea, constipation, pain near the anus) TENDERNESS IN MOUTH AND THROAT WITH OR WITHOUT PRESENCE OF ULCERS (sore throat, sores in mouth, or a toothache) UNUSUAL RASH, SWELLING OR PAIN  UNUSUAL VAGINAL DISCHARGE OR ITCHING   Items with * indicate a potential emergency and should be followed up as soon as possible or go to the Emergency Department if any problems should occur.  Please show the CHEMOTHERAPY ALERT CARD or IMMUNOTHERAPY ALERT CARD at check-in to  the Emergency Department and triage nurse.  Should you have questions after your visit or need to cancel or reschedule your appointment, please contact [facility]  [phone] and follow the prompts.  Office hours are 8:00 a.m. to 4:30 p.m. Monday - Friday. Please note that voicemails left after 4:00 p.m. may not be returned until the following business day.  We are closed weekends and major holidays. You have access to a nurse at all times for urgent questions. Please call the main number to the clinic [phone] and follow the prompts.  For any non-urgent questions, you may also contact your provider using MyChart. We now offer e-Visits for anyone 63 and older to request care online for non-urgent symptoms. For details visit [email or web address].   Also download the MyChart app! Go to the app store, search "MyChart", open the app, select [facility], and log in with your MyChart username and password.  Due to Covid, a mask is required upon entering the hospital/clinic. If you do not have a mask, one will be given to you upon arrival. For doctor visits, patients may have 1 support person aged 82 or older with them. For treatment visits, patients cannot have anyone with them due to current Covid guidelines and our immunocompromised population.

## 2021-07-05 ENCOUNTER — Ambulatory Visit
Admission: RE | Admit: 2021-07-05 | Discharge: 2021-07-05 | Disposition: A | Discharge status: Home / Self Care | Payer: No Typology Code available for payment source | Source: Ambulatory Visit | Attending: Radiation Oncology | Admitting: Radiation Oncology

## 2021-07-05 DIAGNOSIS — C01 Malignant neoplasm of base of tongue: Secondary | ICD-10-CM | POA: Diagnosis not present

## 2021-07-06 ENCOUNTER — Ambulatory Visit: Payer: No Typology Code available for payment source

## 2021-07-06 ENCOUNTER — Ambulatory Visit
Admission: RE | Admit: 2021-07-06 | Discharge: 2021-07-06 | Disposition: A | Discharge status: Home / Self Care | Payer: No Typology Code available for payment source | Source: Ambulatory Visit | Attending: Radiation Oncology | Admitting: Radiation Oncology

## 2021-07-06 DIAGNOSIS — C01 Malignant neoplasm of base of tongue: Secondary | ICD-10-CM | POA: Diagnosis not present

## 2021-07-09 ENCOUNTER — Ambulatory Visit
Admission: RE | Admit: 2021-07-09 | Discharge: 2021-07-09 | Disposition: A | Discharge status: Home / Self Care | Payer: No Typology Code available for payment source | Source: Ambulatory Visit | Attending: Radiation Oncology | Admitting: Radiation Oncology

## 2021-07-09 DIAGNOSIS — C01 Malignant neoplasm of base of tongue: Secondary | ICD-10-CM | POA: Diagnosis not present

## 2021-07-10 ENCOUNTER — Ambulatory Visit: Payer: No Typology Code available for payment source

## 2021-07-10 ENCOUNTER — Ambulatory Visit: Payer: No Typology Code available for payment source | Admitting: Oncology

## 2021-07-10 ENCOUNTER — Other Ambulatory Visit: Payer: No Typology Code available for payment source

## 2021-07-25 ENCOUNTER — Telehealth: Payer: Self-pay | Admitting: *Deleted

## 2021-07-25 NOTE — Telephone Encounter (Signed)
Dr. Tasia Catchings, please advise. Did you want pt evaluated by SM?

## 2021-07-25 NOTE — Telephone Encounter (Signed)
Wife Enid Derry called reporting that since patient has completed radiation therapy and Chemotherapy, he is lethargic, weak and not improving. She is asking that patient be seen or at least have a comprehensive blood panel run to see if his counts are low and can be corrected. Please advise

## 2021-07-26 ENCOUNTER — Inpatient Hospital Stay (HOSPITAL_BASED_OUTPATIENT_CLINIC_OR_DEPARTMENT_OTHER): Payer: No Typology Code available for payment source | Admitting: Hospice and Palliative Medicine

## 2021-07-26 ENCOUNTER — Encounter: Payer: Self-pay | Admitting: Hematology and Oncology

## 2021-07-26 ENCOUNTER — Inpatient Hospital Stay: Payer: No Typology Code available for payment source | Attending: Oncology

## 2021-07-26 ENCOUNTER — Other Ambulatory Visit: Payer: Self-pay

## 2021-07-26 ENCOUNTER — Encounter: Payer: Self-pay | Admitting: Oncology

## 2021-07-26 ENCOUNTER — Inpatient Hospital Stay: Payer: No Typology Code available for payment source

## 2021-07-26 VITALS — BP 134/74 | HR 61 | Temp 98.7°F | Resp 17

## 2021-07-26 DIAGNOSIS — F419 Anxiety disorder, unspecified: Secondary | ICD-10-CM | POA: Insufficient documentation

## 2021-07-26 DIAGNOSIS — C159 Malignant neoplasm of esophagus, unspecified: Secondary | ICD-10-CM

## 2021-07-26 DIAGNOSIS — F32A Depression, unspecified: Secondary | ICD-10-CM | POA: Diagnosis not present

## 2021-07-26 DIAGNOSIS — E860 Dehydration: Secondary | ICD-10-CM

## 2021-07-26 DIAGNOSIS — C01 Malignant neoplasm of base of tongue: Secondary | ICD-10-CM | POA: Diagnosis not present

## 2021-07-26 DIAGNOSIS — Z515 Encounter for palliative care: Secondary | ICD-10-CM | POA: Diagnosis not present

## 2021-07-26 DIAGNOSIS — C160 Malignant neoplasm of cardia: Secondary | ICD-10-CM | POA: Diagnosis not present

## 2021-07-26 LAB — COMPREHENSIVE METABOLIC PANEL
ALT: 18 U/L (ref 0–44)
AST: 24 U/L (ref 15–41)
Albumin: 3.8 g/dL (ref 3.5–5.0)
Alkaline Phosphatase: 41 U/L (ref 38–126)
Anion gap: 6 (ref 5–15)
BUN: 16 mg/dL (ref 8–23)
CO2: 28 mmol/L (ref 22–32)
Calcium: 9 mg/dL (ref 8.9–10.3)
Chloride: 102 mmol/L (ref 98–111)
Creatinine, Ser: 0.74 mg/dL (ref 0.61–1.24)
GFR, Estimated: >60 mL/min (ref >60)
Glucose, Bld: 116 mg/dL — ABNORMAL HIGH (ref 70–99)
Potassium: 3.8 mmol/L (ref 3.5–5.1)
Sodium: 136 mmol/L (ref 135–145)
Total Bilirubin: 0.6 mg/dL (ref 0.3–1.2)
Total Protein: 6.3 g/dL — ABNORMAL LOW (ref 6.5–8.1)

## 2021-07-26 LAB — CBC WITH DIFFERENTIAL/PLATELET
Abs Immature Granulocytes: 0.03 10*3/uL (ref 0.00–0.07)
Basophils Absolute: 0 10*3/uL (ref 0.0–0.1)
Basophils Relative: 1 %
Eosinophils Absolute: 0 10*3/uL (ref 0.0–0.5)
Eosinophils Relative: 0 %
HCT: 30.9 % — ABNORMAL LOW (ref 39.0–52.0)
Hemoglobin: 10.7 g/dL — ABNORMAL LOW (ref 13.0–17.0)
Immature Granulocytes: 1 %
Lymphocytes Relative: 9 %
Lymphs Abs: 0.3 10*3/uL — ABNORMAL LOW (ref 0.7–4.0)
MCH: 32.5 pg (ref 26.0–34.0)
MCHC: 34.6 g/dL (ref 30.0–36.0)
MCV: 93.9 fL (ref 80.0–100.0)
Monocytes Absolute: 0.5 10*3/uL (ref 0.1–1.0)
Monocytes Relative: 14 %
Neutro Abs: 2.6 10*3/uL (ref 1.7–7.7)
Neutrophils Relative %: 75 %
Platelets: 148 10*3/uL — ABNORMAL LOW (ref 150–400)
RBC: 3.29 MIL/uL — ABNORMAL LOW (ref 4.22–5.81)
RDW: 17.8 % — ABNORMAL HIGH (ref 11.5–15.5)
WBC: 3.5 10*3/uL — ABNORMAL LOW (ref 4.0–10.5)
nRBC: 0 % (ref 0.0–0.2)

## 2021-07-26 MED ORDER — HEPARIN SOD (PORK) LOCK FLUSH 100 UNIT/ML IV SOLN
500.0000 [IU] | Freq: Once | INTRAVENOUS | Status: AC
  Administered 2021-07-26: 500 [IU] via INTRAVENOUS
  Filled 2021-07-26: qty 5

## 2021-07-26 MED ORDER — SODIUM CHLORIDE 0.9 % IV SOLN
INTRAVENOUS | Status: DC
  Filled 2021-07-26 (×2): qty 250

## 2021-07-26 MED ORDER — MIRTAZAPINE 7.5 MG PO TABS: 7.5000 mg | ORAL_TABLET | Freq: Every day | ORAL | 0 refills | Status: DC

## 2021-07-26 MED ORDER — ALPRAZOLAM 0.5 MG PO TABS: 0.5000 mg | ORAL_TABLET | Freq: Three times a day (TID) | ORAL | 0 refills | Status: DC | PRN

## 2021-07-26 NOTE — Telephone Encounter (Signed)
Pt has been contacted and will be coming in for labs and [facility] visit today.

## 2021-07-26 NOTE — Progress Notes (Signed)
Pt presents to [facility] for weakness, fatigue, and decreased appetite since last chemo treatment.

## 2021-07-26 NOTE — Telephone Encounter (Signed)
Spoke to [place] this morning and she says that in addition to weakness and lethargy, patient has not been eating well. She is agreeable to bring him in to see symptom management. Are you guys able to accommodate patient in schedule today? I told her that she will be called with appt time once it gets scheduled.   Thanks

## 2021-07-26 NOTE — Progress Notes (Signed)
Symptom Management [place]  [phone] [phone]  Patient Care Team: Derinda Late, MD as PCP - General (Family Medicine) Beverly Gust, MD (Otolaryngology) Pyrtle, Lajuan Lines, MD as Consulting Physician (Gastroenterology) Clapacs, Madie Reno, MD (Psychiatry) Vladimir Crofts, MD as Consulting Physician (Neurology)   Name of the patient: Sean Garner  [id number]  June 12, 1946   Date of visit: 07/26/21  Reason for Consult:  [name]. is a 75 y.o. 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  Patient last saw Dr. Tasia Catchings on 07/04/2021 at which he received day 48, cycle 1 cisplatin/Taxol.  Plan was for follow-up PET scan and repeat EGD in the near future.  Patient presents to [facility] today for fatigue and poor oral intake following his last chemotherapy.  Patient reports that he has not been eating much and is just not hungry.  He is also having fatigue.  He reports occasional difficulty swallowing but is based on the consistency of his food.  He denies mouth or esophageal pain.  No reflux symptoms.  No nausea or vomiting.  No fever or chills.  No shortness of breath or cough.  Denies any neurologic complaints. Denies recent fevers or illnesses. Denies any easy bleeding or bruising. Reports good appetite and denies weight loss. Denies chest pain. Denies any nausea, vomiting, constipation, or diarrhea. Denies urinary complaints. Patient offers no further specific complaints today.  PAST MEDICAL HISTORY: Past Medical History:  Diagnosis Date   Anxiety    Cancer ([place])    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   Hypercholesterolemia    Hypertension     PAST SURGICAL HISTORY:  Past Surgical History:  Procedure Laterality Date   CATARACT EXTRACTION W/PHACO Left 12/03/2017   Procedure: CATARACT EXTRACTION PHACO AND INTRAOCULAR LENS PLACEMENT ([place]) COMPLICATED DIABETIC LEFT;  Surgeon: Leandrew Koyanagi, MD;  Location: [place];  Service: Ophthalmology;  Laterality: Left;  Diabetic - oral meds   CHOLECYSTECTOMY     COLONOSCOPY  2016   PORTA CATH INSERTION N/A 11/13/2020   Procedure: PORTA CATH INSERTION;  Surgeon: Algernon Huxley, MD;  Location: [place] [facility];  Service: Cardiovascular;  Laterality: N/A;    HEMATOLOGY/ONCOLOGY HISTORY:  Oncology History  Carcinoma of base of tongue ([place])  10/17/2020 Initial Diagnosis   Carcinoma of base of tongue ([place])   11/20/2020 - 12/27/2020 Chemotherapy          05/23/2021 -  Chemotherapy    Patient is on Treatment Plan: ESOPHAGUS CARBOPLATIN/PACLITAXEL WEEKLY X 6 WEEKS WITH XRT         Esophageal adenocarcinoma ([place])  04/21/2021 Initial Diagnosis   Esophageal adenocarcinoma ([place])   05/23/2021 -  Chemotherapy    Patient is on Treatment Plan: ESOPHAGUS CARBOPLATIN/PACLITAXEL WEEKLY X 6 WEEKS WITH XRT           ALLERGIES:  has No Known Allergies.  MEDICATIONS:  Current Outpatient Medications  Medication Sig Dispense Refill   ALPRAZolam (XANAX) 0.25 MG tablet Take 1 tablet (0.25 mg total) by mouth at bedtime as  needed for anxiety. (Patient not taking: No sig reported) 60 tablet 0   APIXABAN (ELIQUIS) VTE STARTER PACK ('10MG'$  AND '5MG'$ ) Take as directed on package: start with two-'5mg'$  tablets twice daily for 7 days. On day 8 (01/29/2021), switch to one-'5mg'$  tablet twice daily. 1 tablet 0   brimonidine-timolol (COMBIGAN) 0.2-0.5 % ophthalmic solution Place 1 drop into the left eye daily.     calcium carbonate (OS-CAL) 600 MG TABS tablet Take by mouth.     citalopram (CELEXA) 20 MG tablet Take 1  tablet (20 mg total) by mouth daily. 30 tablet 1   fluticasone (FLONASE) 50 MCG/ACT nasal spray Place into both nostrils. (Patient not taking: Reported on 07/04/2021)     glipiZIDE (GLUCOTROL XL) 5 MG 24 hr tablet Take 5 mg by mouth 2 (two) times daily.  1   glucose blood test strip      magic mouthwash w/lidocaine SOLN Take 5 mLs by mouth. (Patient not taking: No sig reported)     magnesium chloride (SLOW-MAG) 64 MG TBEC SR tablet Take 1 tablet (64 mg total) by mouth 2 (two) times daily. 60 tablet 1   Melatonin 3 MG CAPS Take 5 mg by mouth.     metFORMIN (GLUCOPHAGE-XR) 500 MG 24 hr tablet Take 500 mg by mouth 2 (two) times daily. Takes 2 tablets and 1 tablet in the pm     pantoprazole (PROTONIX) 40 MG tablet Take by mouth.     simvastatin (ZOCOR) 40 MG tablet Take 40 mg by mouth daily at 6 PM.     sucralfate (CARAFATE) 1 g tablet Take 0.5 tablets (0.5 g total) by mouth 3 (three) times daily. Dissolve in warm water, swish and swallow (Patient not taking: No sig reported) 60 tablet 1   traZODone (DESYREL) 50 MG tablet Take 0.5-1 tablets (25-50 mg total) by mouth at bedtime as needed for sleep. (Patient not taking: No sig reported) 30 tablet 2   vitamin B-12 (CYANOCOBALAMIN) 1000 MCG tablet Take 1,000 mcg by mouth daily.     VITAMIN D PO Take 800 Units by mouth daily.     Current Facility-Administered Medications  Medication Dose Route Frequency Provider Last Rate Last Admin   0.9 %  sodium chloride infusion  500 mL Intravenous Once Pyrtle, Lajuan Lines, MD       Facility-Administered Medications Ordered in Other Visits  Medication Dose Route Frequency Provider Last Rate Last Admin   0.9 %  sodium chloride infusion   Intravenous Continuous Lissette Schenk, Kirt Boys, NP 999 mL/hr at 07/26/21 1355 New Bag at 07/26/21 1355   0.9 % NaCl with KCl 20 mEq/ L  infusion   Intravenous Once Corcoran, Melissa C, MD       heparin lock flush 100 UNIT/ML injection            magnesium sulfate IVPB 2 g 50 mL  2 g Intravenous  Once Corcoran, Melissa C, MD        VITAL SIGNS: BP 134/74   Pulse 61   Temp 98.7 F (37.1 C) (Tympanic)   Resp 17   SpO2 99%  There were no vitals filed for this visit.  Estimated body mass index is 24.35 kg/m as calculated from the following:   Height as of 03/14/21: '5\' 10"'$  (1.778 m).   Weight as of 07/04/21: 169 lb 11.2 oz (77 kg).  LABS: CBC:    Component Value Date/Time   WBC 3.5 (L) 07/26/2021 1345   HGB 10.7 (L) 07/26/2021 1345  HGB 12.3 (L) 10/01/2013 0508   HCT 30.9 (L) 07/26/2021 1345   HCT 35.7 (L) 10/01/2013 0508   PLT 148 (L) 07/26/2021 1345   PLT 302 10/01/2013 0508   MCV 93.9 07/26/2021 1345   MCV 89 10/01/2013 0508   NEUTROABS 2.6 07/26/2021 1345   NEUTROABS 9.8 (H) 10/01/2013 0508   LYMPHSABS 0.3 (L) 07/26/2021 1345   LYMPHSABS 1.6 10/01/2013 0508   MONOABS 0.5 07/26/2021 1345   MONOABS 0.9 10/01/2013 0508   EOSABS 0.0 07/26/2021 1345   EOSABS 0.1 10/01/2013 0508   BASOSABS 0.0 07/26/2021 1345   BASOSABS 0.1 10/01/2013 0508   Comprehensive Metabolic Panel:    Component Value Date/Time   NA 136 07/26/2021 1345   NA 135 (L) 09/29/2013 0534   K 3.8 07/26/2021 1345   K 3.8 09/29/2013 0534   CL 102 07/26/2021 1345   CL 101 09/29/2013 0534   CO2 28 07/26/2021 1345   CO2 29 09/29/2013 0534   BUN 16 07/26/2021 1345   BUN 12 09/29/2013 0534   CREATININE 0.74 07/26/2021 1345   CREATININE 1.15 09/29/2013 0534   GLUCOSE 116 (H) 07/26/2021 1345   GLUCOSE 258 (H) 09/29/2013 0534   CALCIUM 9.0 07/26/2021 1345   CALCIUM 8.7 09/29/2013 0534   AST 24 07/26/2021 1345   AST 19 10/01/2013 0508   ALT 18 07/26/2021 1345   ALT 29 10/01/2013 0508   ALKPHOS 41 07/26/2021 1345   ALKPHOS 76 10/01/2013 0508   BILITOT 0.6 07/26/2021 1345   BILITOT 0.6 10/01/2013 0508   PROT 6.3 (L) 07/26/2021 1345   PROT 5.7 (L) 10/01/2013 0508   ALBUMIN 3.8 07/26/2021 1345   ALBUMIN 2.4 (L) 10/01/2013 0508    RADIOGRAPHIC STUDIES: No results found.  PERFORMANCE STATUS  (ECOG) : 1 - Symptomatic but completely ambulatory  Review of Systems Unless otherwise noted, a complete review of systems is negative.  Physical Exam General: NAD Cardiovascular: regular rate and rhythm Pulmonary: clear ant fields Abdomen: soft, nontender, + bowel sounds GU: no suprapubic tenderness Extremities: no edema, no joint deformities Skin: no rashes Neurological: Weakness but otherwise nonfocal  Assessment and Plan- Patient is a 75 y.o. 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  Dehydration -clinically dehydrated.  We will give IV fluids today.   Weight loss -patient is down 5 or 6 pounds over the past month.  He endorses poor oral intake.  He has occasional difficulty swallowing and we discussed this extensively including possible work-up such as EGD or barium swallow but patient is not interested in work-up at the present time.  He has been previously followed by nutrition but is not interested in repeat consultation.  We did discuss the importance of high-calorie/high-protein foods.  He is not consistently drinking oral supplements but told me today that he would commit to drinking a supplement 3 times daily.  He is interested in appetite stimulant.  We will rotate him from citalopram to mirtazapine.  Depression/anxiety -moods are stable.  He has been on citalopram chronically and tolerated well.  He was previously on trazodone for insomnia but this was discontinued due to possible drug interactions.  Patient is now taking melatonin or diphenhydramine and finds it is not effective at initiating sleep.  Will rotate from citalopram to mirtazapine, which hopefully will help with sleep and appetite.  Will refill alprazolam per patient's request due to upcoming  PET/CT (patient is significantly claustrophobic).  May RTC to symptom management clinic at any time  for continued supportive care   Patient expressed understanding and was in agreement with this plan. He also understands that He can call clinic at any time with any questions, concerns, or complaints.   Thank you for allowing me to participate in the care of this very pleasant patient.   Time Total: 25 minutes  Visit consisted of counseling and education dealing with the complex and emotionally intense issues of symptom management and palliative care in the setting of serious and potentially life-threatening illness.Greater than 50%  of this time was spent counseling and coordinating care related to the above assessment and plan.  Signed by: Altha Harm, PhD, NP-C

## 2021-07-27 ENCOUNTER — Encounter: Payer: Self-pay | Admitting: Oncology

## 2021-07-27 ENCOUNTER — Encounter: Payer: Self-pay | Admitting: Hematology and Oncology

## 2021-08-01 ENCOUNTER — Telehealth: Payer: Self-pay

## 2021-08-01 NOTE — Telephone Encounter (Signed)
Nutrition  Called for nutrition follow-up (wife's mobile number only listed). No answer. Left message on voicemail with call back number.  Artie Takayama B. Zenia Resides, [place], [place] Registered Dietitian [phone] (mobile)

## 2021-08-02 ENCOUNTER — Telehealth: Payer: Self-pay

## 2021-08-02 NOTE — Telephone Encounter (Signed)
Nutrition  Wife called RD back and left message.  Says that patient's appetite is better since being seen by [facility].  Patient will be in clinic on Friday.  RD to see at that time.   Treana Lacour B. Zenia Resides, [place], [place] Registered Dietitian [phone] (mobile)

## 2021-08-03 ENCOUNTER — Inpatient Hospital Stay: Payer: No Typology Code available for payment source

## 2021-08-03 ENCOUNTER — Other Ambulatory Visit: Payer: Self-pay

## 2021-08-03 VITALS — BP 127/82 | HR 68 | Temp 98.1°F | Resp 18 | Wt 166.0 lb

## 2021-08-03 DIAGNOSIS — E860 Dehydration: Secondary | ICD-10-CM

## 2021-08-03 DIAGNOSIS — C160 Malignant neoplasm of cardia: Secondary | ICD-10-CM | POA: Diagnosis not present

## 2021-08-03 DIAGNOSIS — Z95828 Presence of other vascular implants and grafts: Secondary | ICD-10-CM

## 2021-08-03 DIAGNOSIS — C159 Malignant neoplasm of esophagus, unspecified: Secondary | ICD-10-CM

## 2021-08-03 LAB — CBC WITH DIFFERENTIAL/PLATELET
Abs Immature Granulocytes: 0.02 10*3/uL (ref 0.00–0.07)
Basophils Absolute: 0 10*3/uL (ref 0.0–0.1)
Basophils Relative: 1 %
Eosinophils Absolute: 0 10*3/uL (ref 0.0–0.5)
Eosinophils Relative: 1 %
HCT: 32.5 % — ABNORMAL LOW (ref 39.0–52.0)
Hemoglobin: 11.2 g/dL — ABNORMAL LOW (ref 13.0–17.0)
Immature Granulocytes: 1 %
Lymphocytes Relative: 9 %
Lymphs Abs: 0.3 10*3/uL — ABNORMAL LOW (ref 0.7–4.0)
MCH: 32.8 pg (ref 26.0–34.0)
MCHC: 34.5 g/dL (ref 30.0–36.0)
MCV: 95.3 fL (ref 80.0–100.0)
Monocytes Absolute: 0.5 10*3/uL (ref 0.1–1.0)
Monocytes Relative: 12 %
Neutro Abs: 2.8 10*3/uL (ref 1.7–7.7)
Neutrophils Relative %: 76 %
Platelets: 145 10*3/uL — ABNORMAL LOW (ref 150–400)
RBC: 3.41 MIL/uL — ABNORMAL LOW (ref 4.22–5.81)
RDW: 17.1 % — ABNORMAL HIGH (ref 11.5–15.5)
WBC: 3.7 10*3/uL — ABNORMAL LOW (ref 4.0–10.5)
nRBC: 0 % (ref 0.0–0.2)

## 2021-08-03 LAB — COMPREHENSIVE METABOLIC PANEL
ALT: 21 U/L (ref 0–44)
AST: 24 U/L (ref 15–41)
Albumin: 4 g/dL (ref 3.5–5.0)
Alkaline Phosphatase: 47 U/L (ref 38–126)
Anion gap: 8 (ref 5–15)
BUN: 19 mg/dL (ref 8–23)
CO2: 26 mmol/L (ref 22–32)
Calcium: 9.1 mg/dL (ref 8.9–10.3)
Chloride: 102 mmol/L (ref 98–111)
Creatinine, Ser: 0.74 mg/dL (ref 0.61–1.24)
GFR, Estimated: >60 mL/min (ref >60)
Glucose, Bld: 199 mg/dL — ABNORMAL HIGH (ref 70–99)
Potassium: 3.7 mmol/L (ref 3.5–5.1)
Sodium: 136 mmol/L (ref 135–145)
Total Bilirubin: 0.9 mg/dL (ref 0.3–1.2)
Total Protein: 6.6 g/dL (ref 6.5–8.1)

## 2021-08-03 MED ORDER — SODIUM CHLORIDE 0.9% FLUSH
10.0000 mL | Freq: Once | INTRAVENOUS | Status: AC
  Filled 2021-08-03: qty 10

## 2021-08-03 MED ORDER — HEPARIN SOD (PORK) LOCK FLUSH 100 UNIT/ML IV SOLN
500.0000 [IU] | Freq: Once | INTRAVENOUS | Status: AC
  Filled 2021-08-03: qty 5

## 2021-08-03 MED ORDER — HEPARIN SOD (PORK) LOCK FLUSH 100 UNIT/ML IV SOLN
500.0000 [IU] | Freq: Once | INTRAVENOUS | Status: AC
  Administered 2021-08-03: 500 [IU] via INTRAVENOUS
  Filled 2021-08-03: qty 5

## 2021-08-03 MED ORDER — SODIUM CHLORIDE 0.9% FLUSH
10.0000 mL | INTRAVENOUS | Status: DC | PRN
  Filled 2021-08-03: qty 10

## 2021-08-03 MED ORDER — SODIUM CHLORIDE 0.9 % IV SOLN
Freq: Once | INTRAVENOUS | Status: DC
  Filled 2021-08-03: qty 250

## 2021-08-03 NOTE — Progress Notes (Signed)
Pt here for labs and possible IV fluids today. Pt states that he has had a good appetite and has gained aprox. 3 pounds. He states that he is still unable to sleep. Citalopram was stopped at last visit and pt was started on Mirtazapine 7.'5mg'$  for sleep/depression/appetite. Pt states that 7.5 mg caused him to have vivid dreams and to act out in his dreams. He states that he ended up on the floor one night. He cut back to half a tablet of 7.'5mg'$ , but states that he has lots of energy and cannot sleep. He is concerned about continuing 7.'5mg'$  because "I might do something harmful in my sleep".  Pt is currently not taking Trazodone. He states that the [place] doctor told him to stop it. Discussed with Dr. Tasia Catchings. She recommends that pt follow-up with psychiatry to help manage depression and sleep issues. Placed call to Dr. Lorette Ang office and appointment scheduled for 08/06/21 at 1:30pm. Pt aware of appointment.

## 2021-08-03 NOTE — Addendum Note (Signed)
Addended by: Jonnie Finner D on: 08/03/2021 10:02 AM   Modules accepted: Orders

## 2021-08-03 NOTE — Progress Notes (Signed)
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  Patient has completed treatment for both cancers most recently chemo and radiation for GE junction cancer.   Met with patient during possible fluids.  Noted patient was seen by [facility] recently and giving fluids.  Patient reports that his appetite is better and eating better. Says that he has been drinking mostly 3 protein shakes (some 30 g protein and some 15 g protein).  Remeron has caused bad dreams. Says that yesterday was about to eat 2 egg omelette with bacon and toast. Lunch yesterday was cheeseburger and sweet tea.  Dinner last night was steak, potato, peach pie and bread.  Taste is still not 100%.      Medications: reviewed  Labs: reviewed  Anthropometrics:   Weight 166 lb today  169 lb 11 oz on 8/17 170 lb 8/10 190 lb 02/20/21 after treatment for base of the tongue cancer   NUTRITION DIAGNOSIS: Inadequate oral intake continues   INTERVENTION:  Reviewed importance of choosing 350 calories shake or higher at least TID Recommend patient check blood glucose on regular basis, currently not doing this. Choose high calorie, high protein foods to help prevent weight loss    MONITORING, EVALUATION, GOAL: weight trends, intake   NEXT VISIT: phone call Friday Oct 14   Laquesha Holcomb B. Zenia Resides, [place], [place] Registered Dietitian [phone] (mobile)

## 2021-08-06 ENCOUNTER — Ambulatory Visit (INDEPENDENT_AMBULATORY_CARE_PROVIDER_SITE_OTHER): Payer: Medicare Other | Admitting: Psychiatry

## 2021-08-06 ENCOUNTER — Encounter: Payer: Self-pay | Admitting: Psychiatry

## 2021-08-06 ENCOUNTER — Other Ambulatory Visit: Payer: Self-pay

## 2021-08-06 VITALS — BP 102/72 | HR 89 | Temp 97.1°F | Wt 167.4 lb

## 2021-08-06 DIAGNOSIS — F411 Generalized anxiety disorder: Secondary | ICD-10-CM | POA: Diagnosis not present

## 2021-08-06 DIAGNOSIS — F4312 Post-traumatic stress disorder, chronic: Secondary | ICD-10-CM

## 2021-08-06 DIAGNOSIS — F5101 Primary insomnia: Secondary | ICD-10-CM

## 2021-08-06 MED ORDER — ESZOPICLONE 1 MG PO TABS: 1.0000 mg | ORAL_TABLET | Freq: Every evening | ORAL | 0 refills | Status: DC | PRN

## 2021-08-06 NOTE — Patient Instructions (Signed)
Eszopiclone tablets What is this medication? ESZOPICLONE (es ZOE pi clone) is used to treat insomnia. This medicine helps you to fall asleep and sleep through the night. This medicine may be used for other purposes; ask your health care provider or pharmacist if you have questions. COMMON BRAND NAME(S): Lunesta What should I tell my care team before I take this medication? They need to know if you have any of these conditions: depression history of a drug or alcohol abuse problem liver disease lung or breathing disease sleep-walking, driving, eating or other activity while not fully awake after taking a sleep medicine suicidal thoughts an unusual or allergic reaction to eszopiclone, other medicines, foods, dyes, or preservatives pregnant or trying to get pregnant breast-feeding How should I use this medication? Take this medicine by mouth with a glass of water. Follow the directions on the prescription label. It is better to take this medicine on an empty stomach and only when you are ready for bed. Do not take your medicine more often than directed. If you have been taking this medicine for several weeks and suddenly stop taking it, you may get unpleasant withdrawal symptoms. Your doctor or health care professional may want to gradually reduce the dose. Do not stop taking this medicine on your own. Always follow your doctor or health care professional's advice. Talk to your pediatrician regarding the use of this medicine in children. Special care may be needed. Overdosage: If you think you have taken too much of this medicine contact a poison control center or emergency room at once. NOTE: This medicine is only for you. Do not share this medicine with others. What if I miss a dose? This does not apply. This medicine should only be taken immediately before going to sleep. Do not take double or extra doses. What may interact with this medication? herbal medicines like kava kava, melatonin, St.  Celestine's wort and valerian lorazepam medicines for fungal infections like ketoconazole, fluconazole, or itraconazole olanzapine This list may not describe all possible interactions. Give your health care provider a list of all the medicines, herbs, non-prescription drugs, or dietary supplements you use. Also tell them if you smoke, drink alcohol, or use illegal drugs. Some items may interact with your medicine. What should I watch for while using this medication? Visit your doctor or health care professional for regular checks on your progress. Keep a regular sleep schedule by going to bed at about the same time nightly. Avoid caffeine-containing drinks in the evening hours, as caffeine can cause trouble with falling asleep. Talk to your doctor if you still have trouble sleeping. After taking this medicine, you may get up out of bed and do an activity that you do not know you are doing. The next morning, you may have no memory of this. Activities include driving a car ("sleep-driving"), making and eating food, talking on the phone, sexual activity, and sleep-walking. Serious injuries have occurred. Stop the medicine and call your doctor right away if you find out you have done any of these activities. Do not take this medicine if you have used alcohol that evening. Do not take it if you have taken another medicine for sleep. The risk of doing these sleep-related activities is higher. Do not take this medicine unless you are able to stay in bed for a full night (7 to 8 hours) before you must be active again. You may have a decrease in mental alertness the day after use, even if you feel that you  are fully awake. Tell your doctor if you will need to perform activities requiring full alertness, such as driving, the next day. Do not stand or sit up quickly after taking this medicine, especially if you are an older patient. This reduces the risk of dizzy or fainting spells. If you or your family notice any changes  in your behavior, such as new or worsening depression, thoughts of harming yourself, anxiety, other unusual or disturbing thoughts, or memory loss, call your doctor right away. After you stop taking this medicine, you may have trouble falling asleep. This is called rebound insomnia. This problem usually goes away on its own after 1 or 2 nights. What side effects may I notice from receiving this medication? Side effects that you should report to your doctor or health care professional as soon as possible: allergic reactions like skin rash, itching or hives, swelling of the face, lips, or tongue changes in vision confusion depressed mood feeling faint or lightheaded, falls hallucinations problems with balance, speaking, walking restlessness, excitability, or feelings of agitation unusual activities while not fully awake like driving, eating, making phone calls Side effects that usually do not require medical attention (report to your doctor or health care professional if they continue or are bothersome): dizziness, or daytime drowsiness, sometimes called a hangover effect headache This list may not describe all possible side effects. Call your doctor for medical advice about side effects. You may report side effects to FDA at 1-800-FDA-1088. Where should I keep my medication? Keep out of the reach of children. This medicine can be abused. Keep your medicine in a safe place to protect it from theft. Do not share this medicine with anyone. Selling or giving away this medicine is dangerous and against the law. This medicine may cause accidental overdose and death if taken by other adults, children, or pets. Mix any unused medicine with a substance like cat litter or coffee grounds. Then throw the medicine away in a sealed container like a sealed bag or a coffee can with a lid. Do not use the medicine after the expiration date. Store at room temperature between 15 and 30 degrees C (59 and 86 degrees  F). NOTE: This sheet is a summary. It may not cover all possible information. If you have questions about this medicine, talk to your doctor, pharmacist, or health care provider.  2022 Elsevier/Gold Standard (2018-05-01 11:57:05)

## 2021-08-06 NOTE — Progress Notes (Signed)
[facility] MD OP Progress Note  08/06/2021 2:01 PM [name].  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  Patient last saw Dr.Yu on 07/04/2021 at which he received a 43, cycle 1 cisplatin/Taxol.  Patient today reports his mood symptoms are stable.  He denies any significant depression or anxiety.  Patient does report mild appetite changes due to his current treatment for his carcinoma however reports he has been eating enough/sufficient for himself.  Patient does report he struggles with sleep.  He was taken off of the Celexa and trazodone and was started on mirtazapine.  He took a few doses of mirtazapine however reports it gives him significant nightmares.  He tried cutting the mirtazapine in to a half tablet however it does not help with his sleep either at that dose.  Even though at that dose he does not have any nightmares.  Patient denies any suicidality, homicidality or perceptual disturbances.  Patient appeared to be alert, oriented to person place time and situation.  Patient denies any other concerns at this time.  Visit Diagnosis:    ICD-10-CM   1. GAD (generalized anxiety disorder)  F41.1     2. Chronic post-traumatic stress disorder (PTSD)  F43.12     3. Primary insomnia  F51.01 eszopiclone (LUNESTA) 1 MG TABS tablet      Past Psychiatric History: Reviewed past psychiatric history from progress note on 01/01/2021.  Past trials of medications like Celexa, trazodone, mirtazapine-nightmares.  Past Medical History:  Past Medical History:  Diagnosis Date   Anxiety    Cancer ([place])    Head/throat Cancer at the base of the tongue   Cataract    lt eye repair; present in rt eye  but not ripe yet.   Claustrophobia    Diabetes mellitus without complication ([place])    Esophageal adenocarcinoma ([place]) 04/21/2021   GERD (gastroesophageal reflux disease)    Glaucoma    using gtts for this.   Hypercholesterolemia    Hypertension     Past Surgical History:  Procedure Laterality Date   CATARACT EXTRACTION W/PHACO Left 12/03/2017   Procedure: CATARACT EXTRACTION PHACO AND INTRAOCULAR LENS PLACEMENT ([place]) COMPLICATED DIABETIC LEFT;  Surgeon: Leandrew Koyanagi, MD;  Location: [facility];  Service: Ophthalmology;  Laterality: Left;  Diabetic - oral meds   CHOLECYSTECTOMY     COLONOSCOPY  2016   PORTA CATH INSERTION N/A 11/13/2020   Procedure: PORTA CATH INSERTION;  Surgeon: Algernon Huxley, MD;  Location: [facility];  Service: Cardiovascular;  Laterality: N/A;    Family Psychiatric History: Reviewed family psychiatric history from progress note on 01/01/2021  Family History:  Family History  Problem Relation Age of Onset   Cancer Mother    Colon cancer Neg Hx    Colon polyps Neg Hx    Esophageal cancer Neg Hx    Rectal cancer Neg Hx    Stomach cancer Neg Hx     Social History: Reviewed social history from progress note on 01/01/2021 Social History   Socioeconomic History   Marital status: Married    Spouse name: Not on file   Number of children: Not on file   Years of education: Not on file   Highest education  level: Not on file  Occupational History   Not on file  Tobacco Use   Smoking status: Former    Types: Cigarettes    Quit date: 45    Years since quitting: 36.7   Smokeless tobacco: Never  Vaping Use   Vaping Use: Never used  Substance and Sexual Activity   Alcohol use: Not Currently    Comment: occasional - 1 glass wine/month   Drug use: Never   Sexual activity: Not on file  Other Topics Concern   Not on file  Social History Narrative   Not on file   Social Determinants of Health   Financial Resource Strain: Not on file   Food Insecurity: Not on file  Transportation Needs: Not on file  Physical Activity: Not on file  Stress: Not on file  Social Connections: Not on file    Allergies: No Known Allergies  Metabolic Disorder Labs: Lab Results  Component Value Date   HGBA1C 8.1 (H) 01/20/2021   MPG 185.77 01/20/2021   No results found for: PROLACTIN No results found for: CHOL, TRIG, HDL, CHOLHDL, VLDL, LDLCALC Lab Results  Component Value Date   TSH 0.577 01/08/2021   TSH 2.077 11/27/2020    Therapeutic Level Labs: No results found for: LITHIUM No results found for: VALPROATE No components found for:  CBMZ  Current Medications: Current Outpatient Medications  Medication Sig Dispense Refill   APIXABAN (ELIQUIS) VTE STARTER PACK ('10MG'$  AND '5MG'$ ) Take as directed on package: start with two-'5mg'$  tablets twice daily for 7 days. On day 8 (01/29/2021), switch to one-'5mg'$  tablet twice daily. 1 tablet 0   brimonidine-timolol (COMBIGAN) 0.2-0.5 % ophthalmic solution Place 1 drop into the left eye daily.     calcium carbonate (OS-CAL) 600 MG TABS tablet Take by mouth.     eszopiclone (LUNESTA) 1 MG TABS tablet Take 1 tablet (1 mg total) by mouth at bedtime as needed for sleep. Take immediately before bedtime 15 tablet 0   glipiZIDE (GLUCOTROL XL) 5 MG 24 hr tablet Take 5 mg by mouth 2 (two) times daily.  1   glucose blood test strip      magic mouthwash w/lidocaine SOLN Take 5 mLs by mouth.     magnesium chloride (SLOW-MAG) 64 MG TBEC SR tablet Take 1 tablet (64 mg total) by mouth 2 (two) times daily. 60 tablet 1   metFORMIN (GLUCOPHAGE-XR) 500 MG 24 hr tablet Take 500 mg by mouth 2 (two) times daily. Takes 2 tablets and 1 tablet in the pm     pantoprazole (PROTONIX) 40 MG tablet Take by mouth.     simvastatin (ZOCOR) 40 MG tablet Take 40 mg by mouth daily at 6 PM.     vitamin B-12 (CYANOCOBALAMIN) 1000 MCG tablet Take 1,000 mcg by mouth daily.     VITAMIN D PO Take 800 Units by mouth daily.     ALPRAZolam  (XANAX) 0.5 MG tablet Take 1 tablet (0.5 mg total) by mouth 3 (three) times daily as needed for anxiety. (Patient not taking: Reported on 08/06/2021) 30 tablet 0   Current Facility-Administered Medications  Medication Dose Route Frequency Provider Last Rate Last Admin   0.9 %  sodium chloride infusion  500 mL Intravenous Once Pyrtle, Lajuan Lines, MD       Facility-Administered Medications Ordered in Other Visits  Medication Dose Route Frequency Provider Last Rate Last Admin   0.9 % NaCl with KCl 20 mEq/ L  infusion   Intravenous Once Corcoran, Melissa C,  MD       heparin lock flush 100 UNIT/ML injection            heparin lock flush 100 unit/mL  500 Units Intravenous Once Earlie Server, MD       magnesium sulfate IVPB 2 g 50 mL  2 g Intravenous Once Corcoran, Melissa C, MD       sodium chloride flush (NS) 0.9 % injection 10 mL  10 mL Intravenous Once Earlie Server, MD         Musculoskeletal: Strength & Muscle Tone: within normal limits Gait & Station: normal Patient leans: N/A  Psychiatric Specialty Exam: Review of Systems  Psychiatric/Behavioral:  Positive for sleep disturbance.   All other systems reviewed and are negative.  Blood pressure 102/72, pulse 89, temperature (!) 97.1 F (36.2 C), temperature source Temporal, weight 167 lb 6.4 oz (75.9 kg).Body mass index is 24.02 kg/m.  General Appearance: Casual  Eye Contact:  Fair  Speech:  Clear and Coherent  Volume:  Normal  Mood:  Euthymic  Affect:  Congruent  Thought Process:  Goal Directed and Descriptions of Associations: Intact  Orientation:  Full (Time, Place, and Person)  Thought Content: Logical   Suicidal Thoughts:  No  Homicidal Thoughts:  No  Memory:  Immediate;   Fair Recent;   Fair Remote;   Fair  Judgement:  Fair  Insight:  Fair  Psychomotor Activity:  Normal  Concentration:  Concentration: Fair and Attention Span: Fair  Recall:  [employer] of Knowledge: Fair  Language: Fair  Akathisia:  No  Handed:  Right  AIMS (if  indicated): done  Assets:  Communication Skills Desire for [place] Talents/Skills Transportation Vocational/Educational  ADL's:  Intact  Cognition: WNL  Sleep:  Poor   Screenings: GAD-7    Flowsheet Row Video Visit from 01/01/2021 in [facility]  Total GAD-7 Score 12      PHQ2-9    [place] Visit from 08/06/2021 in [facility] Video Visit from 02/09/2021 in [facility] Counselor from 01/09/2021 in [facility] Video Visit from 01/01/2021 in [facility] Nutrition from 07/02/2018 in [facility]  PHQ-2 Total Score 0 0 '3 3 1  '$ PHQ-9 Total Score 3 -- 9 9 --      Flowsheet Row ED from 03/14/2021 in [facility] ED to Hosp-Admission (Discharged) from 01/19/2021 in [facility]U Counselor from 01/09/2021 in [facility] No Risk No Risk No Risk        Assessment and Plan: Sean Garneris a 75 year old male, Caucasian, married, has a history of PTSD, stage I ( T1N1MX) 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  Patient continues to be under the care of oncologist and reports he is currently tolerating his treatment and is improving.  Patient however struggles with sleep although he denies any depression or anxiety.  Plan GAD-stable We will monitor closely AIMS - 0  PTSD-stable Patient is currently not on any medications.  Primary insomnia-unstable Discontinue mirtazapine Start Lunesta 1 mg p.o. nightly Provided medication education.  We will coordinate care with his oncologist.Per NP Burke Keels was discontinued per [facility] provider due to  possible drug to drug interaction.  Unknown if this had caused any side effects or not.  08/06/2021- 5 pm Addendum -according to NP Burke Keels discontinued due to drug to drug interaction.  Patient hence was started on mirtazapine.  Follow-up in clinic in 2 weeks in person.  This note was generated in part or whole with voice recognition software. Voice recognition is usually quite accurate but there are transcription errors that can and very often do occur. I apologize for any typographical errors that were not detected and corrected.     Ursula Alert, MD 08/07/2021, 5:14 PM

## 2021-08-13 ENCOUNTER — Other Ambulatory Visit: Payer: Self-pay

## 2021-08-13 ENCOUNTER — Encounter: Payer: Self-pay | Admitting: Radiation Oncology

## 2021-08-13 ENCOUNTER — Ambulatory Visit
Admission: RE | Admit: 2021-08-13 | Discharge: 2021-08-13 | Disposition: A | Discharge status: Home / Self Care | Payer: Medicare Other | Source: Ambulatory Visit | Attending: Radiation Oncology | Admitting: Radiation Oncology

## 2021-08-13 VITALS — BP 119/80 | HR 83 | Temp 96.3°F | Wt 166.2 lb

## 2021-08-13 DIAGNOSIS — C155 Malignant neoplasm of lower third of esophagus: Secondary | ICD-10-CM | POA: Diagnosis present

## 2021-08-13 DIAGNOSIS — C01 Malignant neoplasm of base of tongue: Secondary | ICD-10-CM | POA: Insufficient documentation

## 2021-08-13 DIAGNOSIS — Z923 Personal history of irradiation: Secondary | ICD-10-CM | POA: Diagnosis not present

## 2021-08-13 DIAGNOSIS — C159 Malignant neoplasm of esophagus, unspecified: Secondary | ICD-10-CM

## 2021-08-13 DIAGNOSIS — Z9221 Personal history of antineoplastic chemotherapy: Secondary | ICD-10-CM | POA: Diagnosis not present

## 2021-08-13 NOTE — Progress Notes (Signed)
Radiation Oncology Follow up Note  Name: Sean Garner.   Date:   08/13/2021 MRN:  [id number] DOB: 1946/05/29    This 75 y.o. 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  REFERRING PROVIDER: Derinda Late, MD  HPI: Patient is a 75 year old male well-known to our department.  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  He then was treated for a distal esophageal adenocarcinoma a stage Ib lesion.  Seen today in routine follow-up he is doing well he states his swallowing has markedly improved.  His p.o. intake is good he is having no abdominal pain.  He is having no head and neck pain or dysphagia..  COMPLICATIONS OF TREATMENT: none  FOLLOW UP COMPLIANCE: keeps appointments   PHYSICAL EXAM:  BP 119/80   Pulse 83   Temp (!) 96.3 F (35.7 C) (Tympanic)   Wt 166 lb 3.2 oz (75.4 kg)   BMI 23.85 kg/m  Well-developed well-nourished patient in NAD. HEENT reveals PERLA, EOMI, discs not visualized.  Oral cavity is clear. No oral mucosal lesions are identified. Neck is clear without evidence of cervical or supraclavicular adenopathy. Lungs are clear to A&P. Cardiac examination is essentially unremarkable with regular rate and rhythm without murmur rub or thrill. Abdomen is benign with no organomegaly or masses noted. Motor sensory and DTR levels are equal and symmetric in the upper and lower extremities. Cranial nerves II through XII are grossly intact. Proprioception is intact. No peripheral adenopathy or edema is identified. No motor or sensory levels are noted. Crude visual fields are within normal range.  RADIOLOGY RESULTS: No current films for review PET scan has been ordered in the next  2 months  PLAN: Present time patient is doing well very low side effect profile from both his head and neck radiation as well as his esophageal radiation and pleased with his overall progress.  Of asked to see him back in 3 to 4 months for follow-up.  I have asked him to copy me his PET/CT results.  At some point he may benefit from upper endoscopy we have discussed that certainly will be recommended should there be any findings on PET that warrant further investigation.  Patient knows to call with any concerns.  I would like to take this opportunity to thank you for allowing me to participate in the care of your patient.Noreene Filbert, MD

## 2021-08-22 ENCOUNTER — Other Ambulatory Visit: Payer: Self-pay

## 2021-08-22 ENCOUNTER — Telehealth (INDEPENDENT_AMBULATORY_CARE_PROVIDER_SITE_OTHER): Payer: Medicare Other | Admitting: Psychiatry

## 2021-08-22 ENCOUNTER — Encounter: Payer: Self-pay | Admitting: Psychiatry

## 2021-08-22 DIAGNOSIS — F4312 Post-traumatic stress disorder, chronic: Secondary | ICD-10-CM

## 2021-08-22 DIAGNOSIS — F5101 Primary insomnia: Secondary | ICD-10-CM | POA: Insufficient documentation

## 2021-08-22 DIAGNOSIS — F411 Generalized anxiety disorder: Secondary | ICD-10-CM | POA: Diagnosis not present

## 2021-08-22 MED ORDER — TRAZODONE HCL 50 MG PO TABS: 50.0000 mg | ORAL_TABLET | Freq: Every evening | ORAL | 1 refills | Status: DC | PRN

## 2021-08-22 NOTE — Progress Notes (Signed)
Virtual Visit via Telephone Note  I connected with Sean Garner. on 08/22/21 at  1:30 PM EDT by telephone and verified that I am speaking with the correct person using two identifiers.  Location Provider Location : [facility] Patient Location : Car  Participants: Patient ,Spouse, Provider    I discussed the limitations, risks, security and privacy concerns of performing an evaluation and management service by telephone and the availability of in person appointments. I also discussed with the patient that there may be a patient responsible charge related to this service. The patient expressed understanding and agreed to proceed.   I discussed the assessment and treatment plan with the patient. The patient was provided an opportunity to ask questions and all were answered. The patient agreed with the plan and demonstrated an understanding of the instructions.   The patient was advised to call back or seek an in-person evaluation if the symptoms worsen or if the condition fails to improve as anticipated.    [facility] MD OP Progress Note  08/22/2021 1:20 PM Sean Garner.  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  Patient was evaluated by telemedicine today.  Patient as well as wife participated in the session today.  Patient reports he does have upcoming CAT scan coming up and that does make him anxious since he does not like to be in small spaces.  He does have Xanax available and he plans to take it.  He is currently on the [facility] for sleep.  He reports [name] is beneficial majority of nights however he did have 3-4 nights when he could not sleep at all.  He also had some dreaming however it is  much better than how it was when he was previously on the mirtazapine.  Patient however reports he would like to try the trazodone again since that is the best medication he has ever tried for sleep.  It was discontinued by his provider due to drug to drug interaction with Celexa.  He is no longer on the Celexa.  Patient denies any depression.  Reports appetite is improving.  Denies any suicidality, homicidality or perceptual disturbances.  Patient denies any other concerns today.  Visit Diagnosis:    ICD-10-CM   1. GAD (generalized anxiety disorder)  F41.1     2. Chronic post-traumatic stress disorder (PTSD)  F43.12     3. Primary insomnia  F51.01 traZODone (DESYREL) 50 MG tablet      Past Psychiatric History: I have reviewed past psychiatric history from progress note on 01/01/2021.  Past trials of medications like Celexa, trazodone, mirtazapine-nightmares  Past Medical History:  Past Medical History:  Diagnosis Date   Anxiety    Cancer ([place])    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   Claustrophobia    Diabetes mellitus without complication ([place])    Esophageal adenocarcinoma ([place]) 04/21/2021   GERD (gastroesophageal reflux disease)    Glaucoma    using gtts for this.   Hypercholesterolemia    Hypertension     Past Surgical History:  Procedure Laterality Date   CATARACT EXTRACTION [facility] Left 12/03/2017  Procedure: CATARACT EXTRACTION PHACO AND INTRAOCULAR LENS PLACEMENT ([place]) COMPLICATED DIABETIC LEFT;  Surgeon: Leandrew Koyanagi, MD;  Location: [facility];  Service: Ophthalmology;  Laterality: Left;  Diabetic - oral meds   CHOLECYSTECTOMY     COLONOSCOPY  2016   PORTA CATH INSERTION N/A 11/13/2020   Procedure: PORTA CATH INSERTION;  Surgeon: Algernon Huxley, MD;  Location: [facility];  Service: Cardiovascular;  Laterality: N/A;    Family Psychiatric History: Reviewed family  psychiatric history from progress note on 01/01/2021  Family History:  Family History  Problem Relation Age of Onset   Cancer Mother    Colon cancer Neg Hx    Colon polyps Neg Hx    Esophageal cancer Neg Hx    Rectal cancer Neg Hx    Stomach cancer Neg Hx     Social History: Reviewed social history from progress note on 01/01/2021 Social History   Socioeconomic History   Marital status: Married    Spouse name: Not on file   Number of children: Not on file   Years of education: Not on file   Highest education level: Not on file  Occupational History   Not on file  Tobacco Use   Smoking status: Former    Types: Cigarettes    Quit date: 1986    Years since quitting: 36.7   Smokeless tobacco: Never  Vaping Use   Vaping Use: Never used  Substance and Sexual Activity   Alcohol use: Not Currently    Comment: occasional - 1 glass wine/month   Drug use: Never   Sexual activity: Not on file  Other Topics Concern   Not on file  Social History Narrative   Not on file   Social Determinants of Health   Financial Resource Strain: Not on file  Food Insecurity: Not on file  Transportation Needs: Not on file  Physical Activity: Not on file  Stress: Not on file  Social Connections: Not on file    Allergies: No Known Allergies  Metabolic Disorder Labs: Lab Results  Component Value Date   HGBA1C 8.1 (H) 01/20/2021   MPG 185.77 01/20/2021   No results found for: PROLACTIN No results found for: CHOL, TRIG, HDL, CHOLHDL, VLDL, LDLCALC Lab Results  Component Value Date   TSH 0.577 01/08/2021   TSH 2.077 11/27/2020    Therapeutic Level Labs: No results found for: LITHIUM No results found for: VALPROATE No components found for:  CBMZ  Current Medications: Current Outpatient Medications  Medication Sig Dispense Refill   traZODone (DESYREL) 50 MG tablet Take 1 tablet (50 mg total) by mouth at bedtime as needed for sleep. 30 tablet 1   ALPRAZolam (XANAX) 0.5 MG tablet  Take 1 tablet (0.5 mg total) by mouth 3 (three) times daily as needed for anxiety. (Patient not taking: No sig reported) 30 tablet 0   APIXABAN (ELIQUIS) VTE STARTER PACK (10MG  AND 5MG ) Take as directed on package: start with two-5mg  tablets twice daily for 7 days. On day 8 (01/29/2021), switch to one-5mg  tablet twice daily. 1 tablet 0   brimonidine-timolol (COMBIGAN) 0.2-0.5 % ophthalmic solution Place 1 drop into the left eye daily.     calcium carbonate (OS-CAL) 600 MG TABS tablet Take by mouth.     glipiZIDE (GLUCOTROL XL) 5 MG 24 hr tablet Take 5 mg by mouth 2 (two) times daily.  1   glucose blood test strip      magic mouthwash w/lidocaine SOLN Take 5 mLs by mouth.  magnesium chloride (SLOW-MAG) 64 MG TBEC SR tablet Take 1 tablet (64 mg total) by mouth 2 (two) times daily. 60 tablet 1   metFORMIN (GLUCOPHAGE-XR) 500 MG 24 hr tablet Take 500 mg by mouth 2 (two) times daily. Takes 2 tablets and 1 tablet in the pm     pantoprazole (PROTONIX) 40 MG tablet Take by mouth.     simvastatin (ZOCOR) 40 MG tablet Take 40 mg by mouth daily at 6 PM.     vitamin B-12 (CYANOCOBALAMIN) 1000 MCG tablet Take 1,000 mcg by mouth daily.     VITAMIN D PO Take 800 Units by mouth daily.     Current Facility-Administered Medications  Medication Dose Route Frequency Provider Last Rate Last Admin   0.9 %  sodium chloride infusion  500 mL Intravenous Once Pyrtle, Lajuan Lines, MD       Facility-Administered Medications Ordered in Other Visits  Medication Dose Route Frequency Provider Last Rate Last Admin   0.9 % NaCl with KCl 20 mEq/ L  infusion   Intravenous Once Corcoran, Melissa C, MD       heparin lock flush 100 UNIT/ML injection            heparin lock flush 100 unit/mL  500 Units Intravenous Once Earlie Server, MD       magnesium sulfate IVPB 2 g 50 mL  2 g Intravenous Once Corcoran, Melissa C, MD       sodium chloride flush (NS) 0.9 % injection 10 mL  10 mL Intravenous Once Earlie Server, MD          Musculoskeletal: Strength & Muscle Tone:  [place]:  UTA Patient leans: N/A  Psychiatric Specialty Exam: Review of Systems  Psychiatric/Behavioral:  Positive for sleep disturbance. The patient is nervous/anxious.   All other systems reviewed and are negative.  There were no vitals taken for this visit.There is no height or weight on file to calculate BMI.  General Appearance:  UTA  Eye Contact:   UTA  Speech:  Clear and Coherent  Volume:  Normal  Mood:  Anxious  Affect:   UTA  Thought Process:  Goal Directed and Descriptions of Associations: Intact  Orientation:  Full (Time, Place, and Person)  Thought Content: Logical   Suicidal Thoughts:  No  Homicidal Thoughts:  No  Memory:  Immediate;   Fair Recent;   Fair Remote;   Fair  Judgement:  Fair  Insight:  Fair  Psychomotor Activity:   UTA  Concentration:  Concentration: Fair and Attention Span: Fair  Recall:  [employer] of Knowledge: Fair  Language: Fair  Akathisia:  No  Handed:  Right  AIMS (if indicated): not done  Assets:  Communication Skills Desire for [place] Talents/Skills Transportation Vocational/Educational  ADL's:  Intact  Cognition: WNL  Sleep:  Poor   Screenings: GAD-7    Flowsheet Row Video Visit from 01/01/2021 in [facility]  Total GAD-7 Score 12      PHQ2-9    [place] Visit from 08/06/2021 in [facility] Video Visit from 02/09/2021 in [facility] from 01/09/2021 in [facility] Video Visit from 01/01/2021 in [place] Nutrition from 07/02/2018 in [place]  PHQ-2 Total Score 0 0 3 3 1   PHQ-9 Total Score 3 -- 9 9 --      Flowsheet Row ED from 03/14/2021 in [facility]  [facility] ED to Hosp-Admission (Discharged) from  01/19/2021 in [facility] PCU Counselor from 01/09/2021 in [facility] No Risk No Risk No Risk        Assessment and Plan: Sean Garner. 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  Patient continues to struggle with sleep and will benefit from the following plan.  Plan GAD-stable We will monitor closely.  PTSD-stable Continue to reassess.  Primary insomnia-unstable Discontinue Lunesta. Start trazodone 50 mg p.o. nightly as needed Provided medication education.  Collateral information obtained from wife who reports patient is doing well and agrees that patient slept better on the trazodone.  Follow-up in clinic in 2 to 3 weeks in person or sooner as needed.   I have spent at least 20 minutes non face to face with patient today .  This note was generated in part or whole with voice recognition software. Voice recognition is usually quite accurate but there are transcription errors that can and very often do occur. I apologize for any typographical errors that were not detected and corrected.      Ursula Alert, MD 08/23/2021, 10:07 AM

## 2021-08-31 ENCOUNTER — Inpatient Hospital Stay: Payer: Medicare Other | Attending: Oncology

## 2021-08-31 DIAGNOSIS — C160 Malignant neoplasm of cardia: Secondary | ICD-10-CM | POA: Insufficient documentation

## 2021-08-31 DIAGNOSIS — I82401 Acute embolism and thrombosis of unspecified deep veins of right lower extremity: Secondary | ICD-10-CM | POA: Insufficient documentation

## 2021-08-31 DIAGNOSIS — Z923 Personal history of irradiation: Secondary | ICD-10-CM | POA: Insufficient documentation

## 2021-08-31 DIAGNOSIS — C01 Malignant neoplasm of base of tongue: Secondary | ICD-10-CM | POA: Insufficient documentation

## 2021-08-31 DIAGNOSIS — I2699 Other pulmonary embolism without acute cor pulmonale: Secondary | ICD-10-CM | POA: Insufficient documentation

## 2021-08-31 DIAGNOSIS — Z87891 Personal history of nicotine dependence: Secondary | ICD-10-CM | POA: Insufficient documentation

## 2021-08-31 NOTE — Progress Notes (Signed)
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  Patient has completed treatment for both cancers most recently chemo and radiation for GE junction cancer.    Spoke with patient via phone.  Patient reports that he is eating and has picked up some weight recently.  Taste and dry mouth still effecting ability to eat.  Says he ate a ham biscuit recently and had an awful flour taste in his mouth afterwards.  Sometimes has a metallic taste in his mouth.  Says that he is drinking shakes but getting tired of them.     [facility]: [facility]  Labs: Says recent a1c at [facility] was 4.7  Anthropometrics:   Weight on 10/10 at [facility] 170 lb 5 oz 166 lb on 9/16 169 lb 11 oz on 8/17 170 lb on 8/10 190 lb 02/20/21 after tx of base of tongue cancer   NUTRITION DIAGNOSIS: Inadequate oral intake stable    INTERVENTION:  Continue choosing high calorie, high protein foods to maintain weight Continue shakes as able Patient has contact information and will reach out to RD as needed    MONITORING, EVALUATION, GOAL: weight trends, intake   NEXT VISIT: as needed  Sean Garner, [place], [place] Registered Dietitian [phone] (mobile)

## 2021-09-07 ENCOUNTER — Other Ambulatory Visit: Payer: Self-pay

## 2021-09-07 ENCOUNTER — Telehealth (INDEPENDENT_AMBULATORY_CARE_PROVIDER_SITE_OTHER): Payer: Medicare Other | Admitting: Psychiatry

## 2021-09-07 ENCOUNTER — Encounter: Payer: Self-pay | Admitting: Psychiatry

## 2021-09-07 DIAGNOSIS — F5101 Primary insomnia: Secondary | ICD-10-CM | POA: Diagnosis not present

## 2021-09-07 DIAGNOSIS — F4312 Post-traumatic stress disorder, chronic: Secondary | ICD-10-CM

## 2021-09-07 DIAGNOSIS — Z86718 Personal history of other venous thrombosis and embolism: Secondary | ICD-10-CM | POA: Insufficient documentation

## 2021-09-07 DIAGNOSIS — F411 Generalized anxiety disorder: Secondary | ICD-10-CM

## 2021-09-07 MED ORDER — CITALOPRAM HYDROBROMIDE 10 MG PO TABS: 10.0000 mg | ORAL_TABLET | Freq: Every day | ORAL | 0 refills | Status: DC

## 2021-09-07 NOTE — Progress Notes (Signed)
Virtual Visit via Telephone Note  I connected with Sean Garner. on 09/07/21 at 10:20 AM EDT by telephone and verified that I am speaking with the correct person using two identifiers.  Location Provider Location : [facility] Patient Location : Home  Participants: Patient ,Wife, Provider    I discussed the limitations, risks, security and privacy concerns of performing an evaluation and management service by telephone and the availability of in person appointments. I also discussed with the patient that there may be a patient responsible charge related to this service. The patient expressed understanding and agreed to proceed. I discussed the assessment and treatment plan with the patient. The patient was provided an opportunity to ask questions and all were answered. The patient agreed with the plan and demonstrated an understanding of the instructions.   The patient was advised to call back or seek an in-person evaluation if the symptoms worsen or if the condition fails to improve as anticipated.   [place] MD OP Progress Note  09/07/2021 10:29 AM [place].  MRN:  [id number]  Chief Complaint:  Chief Complaint   Follow-up; Insomnia    HPI: GOLDIE DIMMER Jr.is a 75 year old male with clinical stage I ( T1 N1 Mx) right base of tongue carcinoma, stage Ib adenocarcinoma of GE junction, diabetes mellitus, hypertension, hyperlipidemia, gastroesophageal reflux disease was evaluated by telemedicine today.  Patient today reports since being on the trazodone he is currently sleeping better.  He denies any side effects.  He gets around 7 hours of sleep.  Patient reports he is currently anxious, worried about his health, his upcoming PET scan.  He reports when he used to take the Celexa he felt better and wonders whether he can go back on the Celexa or another medication.  He does have Xanax available which he plans to take prior to his PET scan on Wednesday.  He gets claustrophobic and anxious  otherwise.  Denies any depression, suicidality or homicidality.  Patient continues to have good support system from his wife.  Patient reports appetite is fair.  Patient denies any other concerns today.  Visit Diagnosis:    ICD-10-CM   1. GAD (generalized anxiety disorder)  F41.1     2. Chronic post-traumatic stress disorder (PTSD)  F43.12     3. Primary insomnia  F51.01       Past Psychiatric History: I have reviewed past psychiatric history from progress note on 01/01/2021.  Past trials of medications like Celexa, trazodone, mirtazapine-nightmares  Past Medical History:  Past Medical History:  Diagnosis Date   Anxiety    Cancer ([place])    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   Claustrophobia    Diabetes mellitus without complication ([place])    Esophageal adenocarcinoma ([place]) 04/21/2021   GERD (gastroesophageal reflux disease)    Glaucoma    using gtts for this.   Hypercholesterolemia    Hypertension     Past Surgical History:  Procedure Laterality Date   CATARACT EXTRACTION W/PHACO Left 12/03/2017   Procedure: CATARACT EXTRACTION PHACO AND INTRAOCULAR LENS PLACEMENT ([place]) COMPLICATED DIABETIC LEFT;  Surgeon: Leandrew Koyanagi, MD;  Location: [place];  Service: Ophthalmology;  Laterality: Left;  Diabetic - oral meds   CHOLECYSTECTOMY     COLONOSCOPY  2016   PORTA CATH INSERTION N/A 11/13/2020   Procedure: PORTA CATH INSERTION;  Surgeon: Algernon Huxley, MD;  Location: [place] [facility];  Service: Cardiovascular;  Laterality: N/A;    Family Psychiatric History: Reviewed family psychiatric history from progress note on 01/01/2021  Family History:  Family History  Problem Relation Age of Onset   Cancer Mother    Colon cancer Neg Hx    Colon polyps Neg Hx    Esophageal cancer Neg Hx    Rectal cancer Neg Hx    Stomach cancer Neg Hx     Social History: Reviewed social history from  progress note on 01/01/2021 Social History   Socioeconomic History   Marital status: Married    Spouse name: Not on file   Number of children: Not on file   Years of education: Not on file   Highest education level: Not on file  Occupational History   Not on file  Tobacco Use   Smoking status: Former    Types: Cigarettes    Quit date: 1986    Years since quitting: 36.8   Smokeless tobacco: Never  Vaping Use   Vaping Use: Never used  Substance and Sexual Activity   Alcohol use: Not Currently    Comment: occasional - 1 glass wine/month   Drug use: Never   Sexual activity: Not on file  Other Topics Concern   Not on file  Social History Narrative   Not on file   Social Determinants of Health   Financial Resource Strain: Not on file  Food Insecurity: Not on file  Transportation Needs: Not on file  Physical Activity: Not on file  Stress: Not on file  Social Connections: Not on file    Allergies: No Known Allergies  Metabolic Disorder Labs: Lab Results  Component Value Date   HGBA1C 8.1 (H) 01/20/2021   MPG 185.77 01/20/2021   No results found for: PROLACTIN No results found for: CHOL, TRIG, HDL, CHOLHDL, VLDL, LDLCALC Lab Results  Component Value Date   TSH 0.577 01/08/2021   TSH 2.077 11/27/2020    Therapeutic Level Labs: No results found for: LITHIUM No results found for: VALPROATE No components found for:  CBMZ  Current Medications: Current Outpatient Medications  Medication Sig Dispense Refill   ALPRAZolam (XANAX) 0.5 MG tablet Take 1 tablet (0.5 mg total) by mouth 3 (three) times daily as needed for anxiety. (Patient not taking: No sig reported) 30 tablet 0   APIXABAN (ELIQUIS) VTE STARTER PACK (10MG  AND 5MG ) Take as directed on package: start with two-5mg  tablets twice daily for 7 days. On day 8 (01/29/2021), switch to one-5mg  tablet twice daily. 1 tablet 0   brimonidine-timolol (COMBIGAN) 0.2-0.5 % ophthalmic solution Place 1 drop into the left eye  daily.     calcium carbonate (OS-CAL) 600 MG TABS tablet Take by mouth.     glipiZIDE (GLUCOTROL XL) 5 MG 24 hr tablet Take 5 mg by mouth 2 (two) times daily.  1   glucose blood test strip      magic mouthwash w/lidocaine SOLN Take 5 mLs by mouth.     magnesium chloride (SLOW-MAG) 64 MG TBEC SR tablet Take 1 tablet (64 mg total) by mouth 2 (two) times daily. 60 tablet 1   metFORMIN (GLUCOPHAGE-XR) 500 MG 24 hr tablet Take 500 mg by mouth 2 (two) times daily. Takes 2 tablets and 1 tablet in the pm     pantoprazole (PROTONIX) 40 MG tablet Take by mouth.     simvastatin (ZOCOR) 40 MG tablet Take 40 mg by mouth daily at 6 PM.  traZODone (DESYREL) 50 MG tablet Take 1 tablet (50 mg total) by mouth at bedtime as needed for sleep. 30 tablet 1   vitamin B-12 (CYANOCOBALAMIN) 1000 MCG tablet Take 1,000 mcg by mouth daily.     VITAMIN D PO Take 800 Units by mouth daily.     Current Facility-Administered Medications  Medication Dose Route Frequency Provider Last Rate Last Admin   0.9 %  sodium chloride infusion  500 mL Intravenous Once Pyrtle, Lajuan Lines, MD       Facility-Administered Medications Ordered in Other Visits  Medication Dose Route Frequency Provider Last Rate Last Admin   0.9 % NaCl with KCl 20 mEq/ L  infusion   Intravenous Once Corcoran, Melissa C, MD       heparin lock flush 100 UNIT/ML injection            heparin lock flush 100 unit/mL  500 Units Intravenous Once Earlie Server, MD       magnesium sulfate IVPB 2 g 50 mL  2 g Intravenous Once Corcoran, Melissa C, MD       sodium chloride flush (NS) 0.9 % injection 10 mL  10 mL Intravenous Once Earlie Server, MD         Musculoskeletal: Strength & Muscle Tone:  [place]:  UTA Patient leans: N/A  Psychiatric Specialty Exam: Review of Systems  Psychiatric/Behavioral:  The patient is nervous/anxious.   All other systems reviewed and are negative.  There were no vitals taken for this visit.There is no height or weight on file to  calculate BMI.  General Appearance:  UTA  Eye Contact:   UTA  Speech:  Clear and Coherent  Volume:  Normal  Mood:  Anxious  Affect:   UTA  Thought Process:  Goal Directed and Descriptions of Associations: Intact  Orientation:  Full (Time, Place, and Person)  Thought Content: Logical   Suicidal Thoughts:  No  Homicidal Thoughts:  No  Memory:  Immediate;   Fair Recent;   Fair Remote;   Fair  Judgement:  Fair  Insight:  Fair  Psychomotor Activity:   UTA  Concentration:  Concentration: Fair and Attention Span: Fair  Recall:  [employer] of Knowledge: Fair  Language: Fair  Akathisia:  No  Handed:  Right  AIMS (if indicated): done  Assets:  Communication Skills Desire for Improvement Social Support  ADL's:  Intact  Cognition: WNL  Sleep:  Fair   Screenings: GAD-7    Flowsheet Row Video Visit from 01/01/2021 in [place]  Total GAD-7 Score 12      PHQ2-9    [place] Visit from 08/06/2021 in [facility] Video Visit from 02/09/2021 in [facility] from 01/09/2021 in [facility] Video Visit from 01/01/2021 in [facility] Nutrition from 07/02/2018 in [place]  PHQ-2 Total Score 0 0 3 3 1   PHQ-9 Total Score 3 -- 9 9 --      Flowsheet Row ED from 03/14/2021 in [facility] to Hosp-Admission (Discharged) from 01/19/2021 in [facility] Counselor from 01/09/2021 in [facility] No Risk No Risk No Risk        Assessment and Plan: Sean Garner. 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   Patient with anxiety symptoms, is interested in restarting Celexa, reports sleep is improved on trazodone.  Plan as noted below.  Plan GAD-unstable Restart Celexa at low dose of 10 mg p.o. daily Patient to monitor for side effects, drug to drug interaction.  PTSD-stable We will monitor closely  Primary insomnia-improving Trazodone 50 mg p.o. nightly as needed Continue sleep hygiene techniques   This note was generated in part or whole with voice recognition software. Voice recognition is usually quite accurate but there are transcription errors that can and very often do occur. I apologize for any typographical errors that were not detected and corrected.    Follow-up in clinic in 1 month or sooner in office.   I have spent at least 20 minutes non face to face with patient today .   This note was generated in part or whole with voice recognition software. Voice recognition is usually quite accurate but there are transcription errors that can and very often do occur. I apologize for any typographical errors that were not detected and corrected.       Ursula Alert, MD 09/07/2021, 10:29 AM

## 2021-09-12 ENCOUNTER — Ambulatory Visit
Admission: RE | Admit: 2021-09-12 | Discharge: 2021-09-12 | Disposition: A | Discharge status: Home / Self Care | Payer: No Typology Code available for payment source | Source: Ambulatory Visit | Attending: Oncology | Admitting: Oncology

## 2021-09-12 ENCOUNTER — Other Ambulatory Visit: Payer: Self-pay

## 2021-09-12 ENCOUNTER — Inpatient Hospital Stay: Payer: Medicare Other

## 2021-09-12 DIAGNOSIS — I700 Atherosclerosis of aorta: Secondary | ICD-10-CM | POA: Diagnosis not present

## 2021-09-12 DIAGNOSIS — I2699 Other pulmonary embolism without acute cor pulmonale: Secondary | ICD-10-CM | POA: Diagnosis not present

## 2021-09-12 DIAGNOSIS — C159 Malignant neoplasm of esophagus, unspecified: Secondary | ICD-10-CM

## 2021-09-12 DIAGNOSIS — I82401 Acute embolism and thrombosis of unspecified deep veins of right lower extremity: Secondary | ICD-10-CM | POA: Diagnosis not present

## 2021-09-12 DIAGNOSIS — Z87891 Personal history of nicotine dependence: Secondary | ICD-10-CM | POA: Diagnosis not present

## 2021-09-12 DIAGNOSIS — C160 Malignant neoplasm of cardia: Secondary | ICD-10-CM | POA: Diagnosis not present

## 2021-09-12 DIAGNOSIS — C01 Malignant neoplasm of base of tongue: Secondary | ICD-10-CM | POA: Diagnosis not present

## 2021-09-12 DIAGNOSIS — Z95828 Presence of other vascular implants and grafts: Secondary | ICD-10-CM

## 2021-09-12 DIAGNOSIS — Z923 Personal history of irradiation: Secondary | ICD-10-CM | POA: Diagnosis not present

## 2021-09-12 LAB — COMPREHENSIVE METABOLIC PANEL
ALT: 20 U/L (ref 0–44)
AST: 24 U/L (ref 15–41)
Albumin: 4 g/dL (ref 3.5–5.0)
Alkaline Phosphatase: 55 U/L (ref 38–126)
Anion gap: 6 (ref 5–15)
BUN: 12 mg/dL (ref 8–23)
CO2: 28 mmol/L (ref 22–32)
Calcium: 9.5 mg/dL (ref 8.9–10.3)
Chloride: 103 mmol/L (ref 98–111)
Creatinine, Ser: 0.65 mg/dL (ref 0.61–1.24)
GFR, Estimated: >60 mL/min (ref >60)
Glucose, Bld: 133 mg/dL — ABNORMAL HIGH (ref 70–99)
Potassium: 3.5 mmol/L (ref 3.5–5.1)
Sodium: 137 mmol/L (ref 135–145)
Total Bilirubin: 0.7 mg/dL (ref 0.3–1.2)
Total Protein: 6.7 g/dL (ref 6.5–8.1)

## 2021-09-12 LAB — CBC WITH DIFFERENTIAL/PLATELET
Abs Immature Granulocytes: 0.03 10*3/uL (ref 0.00–0.07)
Basophils Absolute: 0 10*3/uL (ref 0.0–0.1)
Basophils Relative: 1 %
Eosinophils Absolute: 0 10*3/uL (ref 0.0–0.5)
Eosinophils Relative: 1 %
HCT: 33.2 % — ABNORMAL LOW (ref 39.0–52.0)
Hemoglobin: 11.6 g/dL — ABNORMAL LOW (ref 13.0–17.0)
Immature Granulocytes: 1 %
Lymphocytes Relative: 8 %
Lymphs Abs: 0.4 10*3/uL — ABNORMAL LOW (ref 0.7–4.0)
MCH: 32.1 pg (ref 26.0–34.0)
MCHC: 34.9 g/dL (ref 30.0–36.0)
MCV: 92 fL (ref 80.0–100.0)
Monocytes Absolute: 0.3 10*3/uL (ref 0.1–1.0)
Monocytes Relative: 8 %
Neutro Abs: 3.4 10*3/uL (ref 1.7–7.7)
Neutrophils Relative %: 81 %
Platelets: 168 10*3/uL (ref 150–400)
RBC: 3.61 MIL/uL — ABNORMAL LOW (ref 4.22–5.81)
RDW: 11.9 % (ref 11.5–15.5)
WBC: 4.2 10*3/uL (ref 4.0–10.5)
nRBC: 0 % (ref 0.0–0.2)

## 2021-09-12 LAB — GLUCOSE, CAPILLARY: Glucose-Capillary: 116 mg/dL — ABNORMAL HIGH (ref 70–99)

## 2021-09-12 MED ORDER — SODIUM CHLORIDE 0.9% FLUSH
10.0000 mL | INTRAVENOUS | Status: DC | PRN
  Administered 2021-09-12: 10 mL via INTRAVENOUS
  Filled 2021-09-12: qty 10

## 2021-09-12 MED ORDER — FLUDEOXYGLUCOSE F - 18 (FDG) INJECTION
8.7000 | Freq: Once | INTRAVENOUS | Status: AC
  Administered 2021-09-12: 9.38 via INTRAVENOUS

## 2021-09-12 MED ORDER — HEPARIN SOD (PORK) LOCK FLUSH 100 UNIT/ML IV SOLN
500.0000 [IU] | Freq: Once | INTRAVENOUS | Status: AC
  Administered 2021-09-12: 500 [IU] via INTRAVENOUS
  Filled 2021-09-12: qty 5

## 2021-09-13 LAB — CEA: CEA: 1.4 ng/mL (ref 0.0–4.7)

## 2021-09-14 ENCOUNTER — Encounter: Payer: Self-pay | Admitting: Oncology

## 2021-09-14 ENCOUNTER — Other Ambulatory Visit: Payer: Self-pay

## 2021-09-14 ENCOUNTER — Inpatient Hospital Stay (HOSPITAL_BASED_OUTPATIENT_CLINIC_OR_DEPARTMENT_OTHER): Payer: Medicare Other | Admitting: Oncology

## 2021-09-14 VITALS — BP 138/75 | HR 52 | Temp 97.2°F | Wt 167.7 lb

## 2021-09-14 DIAGNOSIS — C159 Malignant neoplasm of esophagus, unspecified: Secondary | ICD-10-CM

## 2021-09-14 DIAGNOSIS — C01 Malignant neoplasm of base of tongue: Secondary | ICD-10-CM | POA: Diagnosis not present

## 2021-09-14 DIAGNOSIS — I2699 Other pulmonary embolism without acute cor pulmonale: Secondary | ICD-10-CM

## 2021-09-14 NOTE — Progress Notes (Signed)
Hematology/Oncology Progress note [facility] [phone] [phone]      Clinic Day:  09/14/2021  Referring physician: Derinda Late, MD  Chief Complaint: Sean Garner. is a 75 y.o. 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    09/28/2020 Neck soft tissue CT on  revealed a 3.5 x 3.0 x 4.8 cm right neck mass most c/w enlarged lymph node. There was a 14 mm enhancing mass in the right base of tongue.   PET scan on 10/25/2020 revealed right tongue base primary with ipsilateral level 2 nodal metastasis. There were no findings of extracervical metastatic disease. There was focal hypermetabolism in the distal esophagus, with possible concurrent soft tissue density lesion. Recommended correlation with endoscopy. There was vague right lower lobe low-level hypermetabolism and ground-glass nodularity, favored minimal infection or aspiration. Incidental findings included aortic atherosclerosis, coronary artery atherosclerosis, emphysema, a tiny hiatal hernia, and prostatomegaly. 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  Speech discrimination scores were 100% in each ear.  11/03/2020 Upper endoscopy on  revealed a malignant esophageal tumor at the gastroesophageal junction. There were esophageal mucosal changes c/w short-segment Barrett's esophagus. There was a 2 cm hiatal hernia. There was gastritis.  Pathology in the esophagus at 36 cm  revealed intramucosal adenocarcinoma arising in a background of high grade dysplasia and intestinal metaplasia; pathology at 38 cm revealed adenocarcinoma.  11/15/2020 EUS on  revealed early stage adenocarcinoma arising from Barrett's esophagus.  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   11/20/2020 - 12/27/2020  6 weeks of cisplatin with concurrent radiation  completed radiation on 01/11/2021.  01/19/2021 Chest abdomen and pelvis CT with contrast on  revealed extensive nearly occlusive pulmonary thromboembolic disease bilaterally with CT evidence of right heart strain c/w at least submassive (intermediate risk) PE. There was no evidence of metastatic disease within the chest, abdomen or pelvis.  Improvement in previously demonstrated distal esophageal wall thickening without apparent focal mass lesion. There was aortic atherosclerosis and emphysema. 01/20/2021 Bilateral lower extremity duplex on  revealed occlusive DVT in the right deep femoral vein. Started on anticoagulation with heparin drip and discharged off Eliquis. Prothrombin gene mutation and factor 5 leiden were both negative  He has PTSD and claustrophobia.  Anxiety was relieved by alprazolam prior to radiation.  He is on citalopram 30 mg a day and trazodone at night.  He was admitted to [facility] from 01/19/2021 - 01/22/2021 with bilateral pulmonary emboli. Bilateral lower extremity venous duplex was positive for occlusive DVT in the right deep femoral vein. He was placed on  and discharged on Eliquis.  # Patient canceled her PET scan that was ordered for assessment of treatment response for head and neck cancer. 04/09/2021, patient was seen by Dr. Elenor Quinones and underwent EGD. Polypoid tumor was seen at the GE junction, consistent with the location of the previously visualized tumor.  With no significant difference compared to photos from other endoscopies.  The proximal esophagus is involved.   Esophagectomy will be applicable.  Biopsy was taken at 38 cm.  Dr. Elenor Quinones has reached out radiation oncology Dr. Baruch Gouty and recommend neoadjuvant chemo and radiation.  05/23/2021-07/04/2021 weekly Carboplatin/ taxol  + RT finished 07/09/2021  INTERVAL HISTORY Sean Garner. is a 75 y.o. 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 Patient reports feeling well. Recently was seen by ENT and had laryngoscopy.  + dry mouth, lack of taste. Otherwise no new complaints.    Past Medical History:  Diagnosis Date   Anxiety    Cancer ([place])    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   Claustrophobia    Diabetes mellitus without complication ([place])    Esophageal adenocarcinoma ([place]) 04/21/2021   GERD (gastroesophageal reflux disease)    Glaucoma    using gtts for this.   Hypercholesterolemia    Hypertension     Past Surgical History:  Procedure Laterality Date   CATARACT EXTRACTION W/PHACO Left 12/03/2017   Procedure: CATARACT EXTRACTION PHACO AND INTRAOCULAR LENS PLACEMENT ([place]) COMPLICATED DIABETIC LEFT;  Surgeon: Leandrew Koyanagi, MD;  Location: [facility];  Service: Ophthalmology;  Laterality: Left;  Diabetic - oral meds   CHOLECYSTECTOMY     COLONOSCOPY  2016   PORTA CATH INSERTION N/A 11/13/2020   Procedure: PORTA CATH INSERTION;  Surgeon: Algernon Huxley, MD;  Location: [facility];  Service: Cardiovascular;  Laterality: N/A;    Family History  Problem Relation Age of Onset   Cancer Mother    Colon cancer Neg Hx    Colon polyps Neg Hx    Esophageal cancer Neg Hx    Rectal cancer Neg Hx    Stomach cancer Neg Hx     Social History:  reports that he quit smoking about 36 years ago. His smoking use included cigarettes. He has never used  smokeless tobacco. He reports that he does not currently use alcohol. He reports that he does not use drugs. He quit smoking cold [place] in 1986. He smoked 2 packs per day for 20 years. He drinks a glass of wine or a Margarita occasionally. 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 He was exposed to [employer]. He worked with a Geologist, engineering for 34 years. The patient is accompanied by his wife today.  Allergies: No Known Allergies  Current Medications: Current Outpatient Medications  Medication Sig Dispense Refill   APIXABAN (ELIQUIS) VTE STARTER PACK (10MG  AND 5MG ) Take as directed on package: start with two-5mg  tablets twice daily for 7 days. On day 8 (01/29/2021), switch to one-5mg  tablet twice daily. 1 tablet 0   brimonidine-timolol (COMBIGAN) 0.2-0.5 % ophthalmic solution Place 1 drop into the left eye daily.     calcium carbonate (OS-CAL) 600 MG TABS tablet Take by mouth.     citalopram (CELEXA) 10 MG tablet Take 1 tablet (10 mg total) by mouth daily. 30 tablet 0   glipiZIDE (GLUCOTROL XL) 5 MG 24 hr tablet Take 5 mg by mouth 2 (two) times daily.  1   glucose blood test strip      magic mouthwash w/lidocaine SOLN Take 5 mLs by mouth.     magnesium chloride (SLOW-MAG) 64 MG TBEC SR tablet Take 1 tablet (64 mg total) by mouth  2 (two) times daily. 60 tablet 1   metFORMIN (GLUCOPHAGE-XR) 500 MG 24 hr tablet Take 500 mg by mouth 2 (two) times daily. Takes 2 tablets and 1 tablet in the pm     pantoprazole (PROTONIX) 40 MG tablet Take by mouth.     simvastatin (ZOCOR) 40 MG tablet Take 40 mg by mouth daily at 6 PM.     traZODone (DESYREL) 50 MG tablet Take 1 tablet (50 mg total) by mouth at bedtime as needed for sleep. 30 tablet 1   vitamin B-12 (CYANOCOBALAMIN) 1000 MCG tablet Take 1,000 mcg by mouth daily.     VITAMIN D PO Take 800 Units by mouth daily.     ALPRAZolam (XANAX) 0.5 MG tablet Take 1 tablet (0.5 mg total) by mouth 3 (three) times daily as needed for anxiety.  (Patient not taking: No sig reported) 30 tablet 0   Current Facility-Administered Medications  Medication Dose Route Frequency Provider Last Rate Last Admin   0.9 %  sodium chloride infusion  500 mL Intravenous Once Pyrtle, Lajuan Lines, MD       Facility-Administered Medications Ordered in Other Visits  Medication Dose Route Frequency Provider Last Rate Last Admin   0.9 % NaCl with KCl 20 mEq/ L  infusion   Intravenous Once Corcoran, Melissa C, MD       heparin lock flush 100 UNIT/ML injection            heparin lock flush 100 unit/mL  500 Units Intravenous Once Earlie Server, MD       magnesium sulfate IVPB 2 g 50 mL  2 g Intravenous Once Corcoran, Melissa C, MD       sodium chloride flush (NS) 0.9 % injection 10 mL  10 mL Intravenous Once Earlie Server, MD        Review of Systems  Constitutional: Negative.  Negative for chills, fever, malaise/fatigue and weight loss.  HENT:  Negative for congestion, ear pain and tinnitus.   Eyes: Negative.  Negative for blurred vision and double vision.  Respiratory: Negative.  Negative for cough, sputum production and shortness of breath.   Cardiovascular:  Negative for chest pain and palpitations.  Gastrointestinal: Negative.  Negative for abdominal pain, constipation, diarrhea, nausea and vomiting.  Genitourinary:  Negative for dysuria, frequency and urgency.  Musculoskeletal:  Negative for back pain and falls.  Skin: Negative.  Negative for rash.  Neurological: Negative.  Negative for weakness and headaches.  Endo/Heme/Allergies: Negative.  Does not bruise/bleed easily.  Psychiatric/Behavioral: Negative.  Negative for depression. The patient is not nervous/anxious and does not have insomnia.   Performance status (ECOG): 1  Vitals Blood pressure 138/75, pulse (!) 52, temperature (!) 97.2 F (36.2 C), temperature source Tympanic, weight 167 lb 11.2 oz (76.1 kg).   Physical Exam Constitutional:      General: He is not in acute distress.    Appearance: He is  not diaphoretic.  HENT:     Head: Normocephalic and atraumatic.     Nose: Nose normal.     Mouth/Throat:     Pharynx: No oropharyngeal exudate.  Eyes:     General: No scleral icterus.    Pupils: Pupils are equal, round, and reactive to light.  Cardiovascular:     Rate and Rhythm: Normal rate and regular rhythm.     Heart sounds: No murmur heard. Pulmonary:     Effort: Pulmonary effort is normal. No respiratory distress.     Breath sounds: No rales.  Chest:  Chest wall: No tenderness.  Abdominal:     General: There is no distension.     Palpations: Abdomen is soft.     Tenderness: There is no abdominal tenderness.  Musculoskeletal:        General: Normal range of motion.     Cervical back: Normal range of motion and neck supple.  Skin:    General: Skin is warm and dry.     Findings: No erythema.  Neurological:     Mental Status: He is alert and oriented to person, place, and time.     Cranial Nerves: No cranial nerve deficit.     Motor: No abnormal muscle tone.     Coordination: Coordination normal.  Psychiatric:        Mood and Affect: Affect normal.    Hospital Outpatient Visit on 09/12/2021  Component Date Value Ref Range Status   Glucose-Capillary 09/12/2021 116 (A)  70 - 99 mg/dL Final   Glucose reference range applies only to samples taken after fasting for at least 8 hours.  Infusion on 09/12/2021  Component Date Value Ref Range Status   CEA 09/12/2021 1.4  0.0 - 4.7 ng/mL Final   Comment: (NOTE)                             Nonsmokers          <3.9                             Smokers             <5.6 Roche [employer] Electrochemiluminescence Immunoassay (ECLIA) Values obtained with different assay methods or kits cannot be used interchangeably.  Results cannot be interpreted as absolute evidence of the presence or absence of malignant disease. Performed At: [facility] [place], [place] [id number] Rush Farmer MD SE:[id number]     Sodium 09/12/2021 137  135 - 145 mmol/L Final   Potassium 09/12/2021 3.5  3.5 - 5.1 mmol/L Final   Chloride 09/12/2021 103  98 - 111 mmol/L Final   CO2 09/12/2021 28  22 - 32 mmol/L Final   Glucose, Bld 09/12/2021 133 (A)  70 - 99 mg/dL Final   Glucose reference range applies only to samples taken after fasting for at least 8 hours.   BUN 09/12/2021 12  8 - 23 mg/dL Final   Creatinine, Ser 09/12/2021 0.65  0.61 - 1.24 mg/dL Final   Calcium 09/12/2021 9.5  8.9 - 10.3 mg/dL Final   Total Protein 09/12/2021 6.7  6.5 - 8.1 g/dL Final   Albumin 09/12/2021 4.0  3.5 - 5.0 g/dL Final   AST 09/12/2021 24  15 - 41 U/L Final   ALT 09/12/2021 20  0 - 44 U/L Final   Alkaline Phosphatase 09/12/2021 55  38 - 126 U/L Final   Total Bilirubin 09/12/2021 0.7  0.3 - 1.2 mg/dL Final   GFR, Estimated 09/12/2021 >60  >60 mL/min Final   Comment: (NOTE) Calculated using the CKD-EPI Creatinine Equation (2021)    Anion gap 09/12/2021 6  5 - 15 Final   Performed at [facility], [place], [place] [address]   WBC 09/12/2021 4.2  4.0 - 10.5 K/uL Final   RBC 09/12/2021 3.61 (A)  4.22 - 5.81 MIL/uL Final   Hemoglobin 09/12/2021 11.6 (A)  13.0 - 17.0 g/dL Final   HCT 09/12/2021  33.2 (A)  39.0 - 52.0 % Final   MCV 09/12/2021 92.0  80.0 - 100.0 fL Final   MCH 09/12/2021 32.1  26.0 - 34.0 pg Final   MCHC 09/12/2021 34.9  30.0 - 36.0 g/dL Final   RDW 09/12/2021 11.9  11.5 - 15.5 % Final   Platelets 09/12/2021 168  150 - 400 K/uL Final   nRBC 09/12/2021 0.0  0.0 - 0.2 % Final   Neutrophils Relative % 09/12/2021 81  % Final   Neutro Abs 09/12/2021 3.4  1.7 - 7.7 K/uL Final   Lymphocytes Relative 09/12/2021 8  % Final   Lymphs Abs 09/12/2021 0.4 (A)  0.7 - 4.0 K/uL Final   Monocytes Relative 09/12/2021 8  % Final   Monocytes Absolute 09/12/2021 0.3  0.1 - 1.0 K/uL Final   Eosinophils Relative 09/12/2021 1  % Final   Eosinophils Absolute 09/12/2021 0.0  0.0 - 0.5 K/uL Final   Basophils  Relative 09/12/2021 1  % Final   Basophils Absolute 09/12/2021 0.0  0.0 - 0.1 K/uL Final   Immature Granulocytes 09/12/2021 1  % Final   Abs Immature Granulocytes 09/12/2021 0.03  0.00 - 0.07 K/uL Final   Performed at [facility], [address], [place], [place] [address]    Assessment/Plan.  1. Esophageal adenocarcinoma ([place])   2. Carcinoma of base of tongue ([place])   3. Pulmonary embolism, bilateral ([facility])    Cancer Staging Carcinoma of base of tongue ([place]) Staging form: Pharynx - HPV-Mediated Oropharynx, AJCC 8th Edition - Clinical stage from 10/11/2020: Stage I (cT1, cN1, cM0, p16+) - Unsigned  # Clinical stage I right base of tongue carcinoma, Stage I, P16 positive.  He completed concurrent radiation and cisplatin on 01/11/2021. 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  Proceed with weekly carboplatin and Taxol today. S/p concurrent chemotherapy carboplatin/ Taxol and RT finished 07/09/2021   09/12/2021 PET scan was reviewed independently by me   No signs of increased hypermetabolic activity in the neck to indicate residual head neck cancer.  No hypermetabolism in the esophagus. No signs of metastasis to the neck, chest, abdomen or pelvis.  Will repeat EGD  # Bilateral pulmonary emboli and RLE DVT - 01/2021 Recommend patient to decrease Eliquis to 2.5 mg twice daily for prophylaxis.   I discussed the assessment and treatment plan with the patient.  The patient was provided an opportunity to ask questions and all were answered.  The patient agreed with the plan and demonstrated an understanding of the instructions.  The patient was advised to call back if the symptoms worsen or if the condition fails to improve as anticipated.  Follow up  in 3 months.    Earlie Server, MD, PhD Hematology Oncology [place] at [facility] 09/14/2021

## 2021-09-15 ENCOUNTER — Encounter: Payer: Self-pay | Admitting: Hematology and Oncology

## 2021-09-15 ENCOUNTER — Encounter: Payer: Self-pay | Admitting: Oncology

## 2021-09-17 ENCOUNTER — Telehealth: Payer: Self-pay | Admitting: *Deleted

## 2021-09-17 NOTE — Telephone Encounter (Signed)
Patient called back and I informed him of Dr Collie Siad response, He was pleased to her this and thanked [place] for the good news. He will wait to hear from Dr Vena Rua office and if he doe snot hear form them, he will call them

## 2021-09-17 NOTE — Telephone Encounter (Signed)
Patient called stating that results of PET were not available at his appointment and he would like a call to go over the results with him   CLINICAL DATA:  Subsequent treatment strategy for head neck cancer, also with reported history of esophageal cancer.   EXAM: NUCLEAR MEDICINE PET SKULL BASE TO THIGH   TECHNIQUE: 9.38 mCi F-18 FDG was injected intravenously. Full-ring PET imaging was performed from the skull base to thigh after the radiotracer. CT data was obtained and used for attenuation correction and anatomic localization.   Fasting blood glucose: 116 mg/dl   COMPARISON:  CT of the chest abdomen and pelvis from January 19, 2021. Prior PET imaging from October 25, 2020.   FINDINGS: Mediastinal blood pool activity: SUV max 2.15   Liver activity: SUV max NA   NECK: No focal hypermetabolic changes to indicate residual or recurrent disease in the neck. Blurring of tissue planes likely indicative of post treatment change about the RIGHT neck.   Incidental CT findings: none   CHEST: No hypermetabolic mediastinal or hilar nodes. No suspicious pulmonary nodules on the CT scan. Focal esophageal uptake no longer visible.   Incidental CT findings: RIGHT-sided Port-A-Cath terminates at the caval to atrial junction. Heart size moderately enlarged without substantial pericardial effusion. Calcified atheromatous plaque in the thoracic aorta. Normal caliber of the central pulmonary vessels. Limited assessment of cardiovascular structures given lack of intravenous contrast.   No effusion.  No consolidative changes.   ABDOMEN/PELVIS: No abnormal hypermetabolic activity within the liver, pancreas, adrenal glands, or spleen. No hypermetabolic lymph nodes in the abdomen or pelvis.   Incidental CT findings: No acute findings relative to liver, pancreas, spleen, adrenal glands, kidneys, stomach, large or small bowel.   Patulous appearance of the distal esophagus with hiatal hernia.

## 2021-09-19 ENCOUNTER — Telehealth: Payer: Self-pay

## 2021-09-19 DIAGNOSIS — C160 Malignant neoplasm of cardia: Secondary | ICD-10-CM

## 2021-09-19 NOTE — Telephone Encounter (Signed)
Ok, the note to repeat EGD now came at the recommendation of the cancer center provider JMP

## 2021-09-19 NOTE — Telephone Encounter (Signed)
Called pts wife to schedule repeat EGD. She isn't sure if they want to schedule this right away, she wonders if he needs to see Dr. Hilarie Fredrickson first and let him decide when EGD needs to be done. Discussed with pts wife that she should contact the cancer center and see exactly when they want the procedure scheduled. Asked that she call us back once she hears from oncology. Dr. Hilarie Fredrickson aware.

## 2021-09-19 NOTE — Telephone Encounter (Signed)
-----   Message from Jerene Bears, MD sent at 09/18/2021  4:42 PM EDT ----- Sure, I will work to get it scheduled.  Vaughan Basta, Could you please arrange an EGD for Sean Garner for surveillance of esophageal adenocarcinoma. Thanks JMP  ----- Message ----- From: Earlie Server, MD Sent: 09/15/2021   2:46 PM EDT To: Jerene Bears, MD, Clent Jacks, RN  Dr.Pyrtle,  Patient has finished concurrent chemo RT in August and repeat PET scan showed complete remission. Would you please repeat his EGD for surveillance?  Thanks.

## 2021-09-20 ENCOUNTER — Telehealth: Payer: Self-pay

## 2021-09-20 ENCOUNTER — Telehealth: Payer: Self-pay | Admitting: Oncology

## 2021-09-20 NOTE — Telephone Encounter (Signed)
  Patients wife called earlier wanting to speak to one of Dr. Collie Siad team in reference to scheduling appointment with Dr. Hilarie Fredrickson to repeat EGD. Patient's wife was confused in what the follow up would consist of and didn't realize that would include the EGD at this time. I reassured Ms. Lia the follow up process includes the EGD. Patient wife just wanted clarification. After explaining what Dr. Collie Siad recommendation, patients wife verbalized understanding.

## 2021-09-21 NOTE — Telephone Encounter (Signed)
Patients wife called regarding the EGD has another question.

## 2021-09-24 NOTE — Telephone Encounter (Signed)
Pts wife called back to schedule EGD. Pt scheduled for EGD in the [place] 10/29/21 at 10am, pt to arrive here at 9am. Pt to be NPO after midnight, mailed instructions and amb referral in epic. Pts wife would like Dr. Hilarie Fredrickson to call pt prior to having EGD done to discuss EGD.

## 2021-09-24 NOTE — Telephone Encounter (Signed)
Dr. Hilarie Fredrickson pt is now on Eliquis. Do you want pt to hold prior to EGD? Wife reports oncology manages this med. Please advise.

## 2021-09-28 NOTE — Telephone Encounter (Signed)
I spoke to patient's wife today by phone regarding repeating upper endoscopy.  Dr. Tasia Catchings, his oncologist, recommend surveillance EGD at this time. The patient and his wife are now on board and understand the recommendation and are happy to proceed with upper endoscopy as scheduled on 10/29/2021.  We will need to hold Eliquis, usually 48 hours, prior to the procedure.  Please send Dr. Tasia Catchings a note to ensure this is acceptable from his perspective given that he manages this medication given Mr. Donoghue history of PE.  Thanks [employer]

## 2021-10-01 NOTE — Telephone Encounter (Signed)
Dr. Tasia Catchings, Mr. Sean Garner is scheduled for an EGD on 10/29/21. Dr. Hilarie Fredrickson would like to have the pt hold his eliquis for 48 hours prior to the procedure. Please advise is this is ok with you to hold the eliquis.

## 2021-10-03 NOTE — Telephone Encounter (Signed)
Spoke with pts wife and she is aware and knows pt to hold eliquis for 48 hours prior to procedure and then resume after procedure.

## 2021-10-06 ENCOUNTER — Other Ambulatory Visit: Payer: Self-pay | Admitting: Psychiatry

## 2021-10-06 DIAGNOSIS — F411 Generalized anxiety disorder: Secondary | ICD-10-CM

## 2021-10-06 DIAGNOSIS — F5101 Primary insomnia: Secondary | ICD-10-CM

## 2021-10-09 ENCOUNTER — Ambulatory Visit: Payer: No Typology Code available for payment source | Admitting: Psychiatry

## 2021-10-22 ENCOUNTER — Encounter: Payer: Self-pay | Admitting: Psychiatry

## 2021-10-22 ENCOUNTER — Other Ambulatory Visit: Payer: Self-pay

## 2021-10-22 ENCOUNTER — Ambulatory Visit (INDEPENDENT_AMBULATORY_CARE_PROVIDER_SITE_OTHER): Payer: Medicare Other | Admitting: Psychiatry

## 2021-10-22 VITALS — BP 159/76 | HR 57 | Temp 98.5°F | Wt 170.2 lb

## 2021-10-22 DIAGNOSIS — F5101 Primary insomnia: Secondary | ICD-10-CM

## 2021-10-22 DIAGNOSIS — R030 Elevated blood-pressure reading, without diagnosis of hypertension: Secondary | ICD-10-CM | POA: Diagnosis not present

## 2021-10-22 DIAGNOSIS — F411 Generalized anxiety disorder: Secondary | ICD-10-CM | POA: Diagnosis not present

## 2021-10-22 DIAGNOSIS — F4312 Post-traumatic stress disorder, chronic: Secondary | ICD-10-CM | POA: Diagnosis not present

## 2021-10-22 MED ORDER — TRAZODONE HCL 50 MG PO TABS: 50.0000 mg | ORAL_TABLET | Freq: Every evening | ORAL | 1 refills | Status: DC | PRN

## 2021-10-22 NOTE — Progress Notes (Signed)
[facility] MD OP Progress Note  10/22/2021 3:25 PM [place].  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  Patient reports he is currently sleeping well on the trazodone.  He denies any side effects.  He also likes being back on the Celexa although low-dose which does help with his anxiety.  Although anxious he is managing it okay.  Denies side effects.  Patient reports he had a good Thanksgiving holiday and looks forward to Christmas.  He enjoys spending time with his family.  Patient denies any suicidality, homicidality or perceptual disturbances.  Patient aware that his blood pressure is elevated in session today however reports he has been monitoring it and is currently off his antihypertensive medication since he does not need it.  He agrees to follow up with primary care provider if needed.  Patient had repeat PET scan recently which showed complete remission of his carcinoma.  He is currently scheduled for an EGD as follow-up.  Patient denies any other concerns today.  Visit Diagnosis:    ICD-10-CM   1. GAD (generalized anxiety disorder)  F41.1     2. Chronic post-traumatic stress disorder (PTSD)  F43.12     3. Primary insomnia  F51.01 traZODone (DESYREL) 50 MG tablet    4. Elevated blood pressure reading  R03.0       Past Psychiatric History: Reviewed past psychiatric history from progress note on 01/01/2021.  Past trials of medications like Celexa, trazodone, mirtazapine-nightmares  Past Medical History:  Past Medical History:  Diagnosis Date   Anxiety    Cancer ([place])    Head/throat Cancer at the base of the tongue   Cataract    lt eye repair; present in rt eye but not  ripe yet.   Claustrophobia    Diabetes mellitus without complication ([place])    Esophageal adenocarcinoma ([place]) 04/21/2021   GERD (gastroesophageal reflux disease)    Glaucoma    using gtts for this.   Hypercholesterolemia    Hypertension     Past Surgical History:  Procedure Laterality Date   CATARACT EXTRACTION W/PHACO Left 12/03/2017   Procedure: CATARACT EXTRACTION PHACO AND INTRAOCULAR LENS PLACEMENT ([place]) COMPLICATED DIABETIC LEFT;  Surgeon: Leandrew Koyanagi, MD;  Location: [facility];  Service: Ophthalmology;  Laterality: Left;  Diabetic - oral meds   CHOLECYSTECTOMY     COLONOSCOPY  2016   PORTA CATH INSERTION N/A 11/13/2020   Procedure: PORTA CATH INSERTION;  Surgeon: Algernon Huxley, MD;  Location: [facility];  Service: Cardiovascular;  Laterality: N/A;    Family Psychiatric History: Reviewed family psychiatric history from progress note on 01/01/2021  Family History:  Family History  Problem Relation Age of Onset   Cancer Mother    Colon cancer Neg Hx    Colon polyps Neg Hx    Esophageal cancer Neg Hx    Rectal cancer Neg Hx    Stomach cancer Neg Hx     Social History: Reviewed social history from progress note on 01/01/2021 Social History   Socioeconomic History   Marital status: Married    Spouse name: Not on file   Number of children: Not on file   Years of education: Not on file  Highest education level: Not on file  Occupational History   Not on file  Tobacco Use   Smoking status: Former    Types: Cigarettes    Quit date: 7    Years since quitting: 36.9   Smokeless tobacco: Never  Vaping Use   Vaping Use: Never used  Substance and Sexual Activity   Alcohol use: Not Currently    Comment: occasional - 1 glass wine/month   Drug use: Never   Sexual activity: Not on file  Other Topics Concern   Not on file  Social History Narrative   Not on file   Social Determinants of Health   Financial Resource Strain: Not on file  Food  Insecurity: Not on file  Transportation Needs: Not on file  Physical Activity: Not on file  Stress: Not on file  Social Connections: Not on file    Allergies: No Known Allergies  Metabolic Disorder Labs: Lab Results  Component Value Date   HGBA1C 8.1 (H) 01/20/2021   MPG 185.77 01/20/2021   No results found for: PROLACTIN No results found for: CHOL, TRIG, HDL, CHOLHDL, VLDL, LDLCALC Lab Results  Component Value Date   TSH 0.577 01/08/2021   TSH 2.077 11/27/2020    Therapeutic Level Labs: No results found for: LITHIUM No results found for: VALPROATE No components found for:  CBMZ  Current Medications: Current Outpatient Medications  Medication Sig Dispense Refill   ALPRAZolam (XANAX) 0.5 MG tablet Take 1 tablet (0.5 mg total) by mouth 3 (three) times daily as needed for anxiety. 30 tablet 0   APIXABAN (ELIQUIS) VTE STARTER PACK (10MG  AND 5MG ) Take as directed on package: start with two-5mg  tablets twice daily for 7 days. On day 8 (01/29/2021), switch to one-5mg  tablet twice daily. 1 tablet 0   brimonidine-timolol (COMBIGAN) 0.2-0.5 % ophthalmic solution Place 1 drop into the left eye daily.     calcium carbonate (OS-CAL) 600 MG TABS tablet Take by mouth.     citalopram (CELEXA) 10 MG tablet TAKE 1 TABLET BY MOUTH EVERY DAY 90 tablet 0   glipiZIDE (GLUCOTROL XL) 5 MG 24 hr tablet Take 5 mg by mouth 2 (two) times daily.  1   glucose blood test strip      magic mouthwash w/lidocaine SOLN Take 5 mLs by mouth.     magnesium chloride (SLOW-MAG) 64 MG TBEC SR tablet Take 1 tablet (64 mg total) by mouth 2 (two) times daily. 60 tablet 1   metFORMIN (GLUCOPHAGE-XR) 500 MG 24 hr tablet Take 500 mg by mouth 2 (two) times daily. Takes 2 tablets and 1 tablet in the pm     pantoprazole (PROTONIX) 40 MG tablet Take by mouth.     simvastatin (ZOCOR) 40 MG tablet Take 40 mg by mouth daily at 6 PM.     vitamin B-12 (CYANOCOBALAMIN) 1000 MCG tablet Take 1,000 mcg by mouth daily.     VITAMIN D  PO Take 800 Units by mouth daily.     traZODone (DESYREL) 50 MG tablet Take 1 tablet (50 mg total) by mouth at bedtime as needed for sleep. 90 tablet 1   Current Facility-Administered Medications  Medication Dose Route Frequency Provider Last Rate Last Admin   0.9 %  sodium chloride infusion  500 mL Intravenous Once Pyrtle, Lajuan Lines, MD       Facility-Administered Medications Ordered in Other Visits  Medication Dose Route Frequency Provider Last Rate Last Admin   0.9 % NaCl with KCl 20 mEq/ L  infusion   Intravenous Once Nolon Stalls C, MD       heparin lock flush 100 UNIT/ML injection            heparin lock flush 100 unit/mL  500 Units Intravenous Once Earlie Server, MD       magnesium sulfate IVPB 2 g 50 mL  2 g Intravenous Once Corcoran, Melissa C, MD       sodium chloride flush (NS) 0.9 % injection 10 mL  10 mL Intravenous Once Earlie Server, MD         Musculoskeletal: Strength & Muscle Tone: within normal limits Gait & Station: normal Patient leans: N/A  Psychiatric Specialty Exam: Review of Systems  Psychiatric/Behavioral:  Negative for agitation, behavioral problems, confusion, decreased concentration, dysphoric mood, hallucinations, self-injury, sleep disturbance and suicidal ideas. The patient is nervous/anxious. The patient is not hyperactive.   All other systems reviewed and are negative.  Blood pressure (!) 159/76, pulse (!) 57, temperature 98.5 F (36.9 C), temperature source Temporal, weight 170 lb 3.2 oz (77.2 kg).Body mass index is 24.42 kg/m.  General Appearance: Casual  Eye Contact:  Fair  Speech:  Clear and Coherent  Volume:  Normal  Mood:  Anxious  Affect:  Congruent  Thought Process:  Goal Directed and Descriptions of Associations: Intact  Orientation:  Full (Time, Place, and Person)  Thought Content: Logical   Suicidal Thoughts:  No  Homicidal Thoughts:  No  Memory:  Immediate;   Fair Recent;   Fair Remote;   Fair  Judgement:  Fair  Insight:  Fair   Psychomotor Activity:  Normal  Concentration:  Concentration: Fair and Attention Span: Fair  Recall:  [employer] of Knowledge: Fair  Language: Fair  Akathisia:  No  Handed:  Right  AIMS (if indicated): done, 0  Assets:  Communication Skills Desire for Improvement Housing Social Support  ADL's:  Intact  Cognition: WNL  Sleep:  Fair   Screenings: GAD-7    Flowsheet Row Video Visit from 01/01/2021 in [facility]  Total GAD-7 Score 12      PHQ2-9    [facility] Visit from 10/22/2021 in [facility] Visit from 08/06/2021 in [facility] Video Visit from 02/09/2021 in [facility] Counselor from 01/09/2021 in [facility] Video Visit from 01/01/2021 in [facility]  PHQ-2 Total Score 0 0 0 3 3  PHQ-9 Total Score -- 3 -- 9 9      [facility] ED from 03/14/2021 in [facility] ED to Hosp-Admission (Discharged) from 01/19/2021 in [facility] Counselor from 01/09/2021 in [facility] No Risk No Risk No Risk        Assessment and Plan: Sean Garneris a 75 year old Caucasian male, married, has a history of PTSD, stage I right base of tongue carcinoma, stage Ib adenocarcinoma of GE junction, hypertension, hyperlipidemia, GERD, diabetes was evaluated in office today.  Patient is currently improving on the Celexa with regards to his anxiety as well as reports sleep is good on trazodone.  Plan as noted below.  Plan GAD-improving Celexa at low dose of 10 mg p.o. daily  PTSD-stable We will monitor closely  Primary insomnia-stable Trazodone 50 mg p.o. nightly as needed  Elevated blood pressure reading-patient advised to monitor blood pressure and follow up with primary care  provider.  We will  coordinate care with primary care provider.  Follow-up in clinic in 3 months or sooner in person.  This note was generated in part or whole with voice recognition software. Voice recognition is usually quite accurate but there are transcription errors that can and very often do occur. I apologize for any typographical errors that were not detected and corrected.       Ursula Alert, MD 10/22/2021, 3:25 PM

## 2021-10-29 ENCOUNTER — Ambulatory Visit (AMBULATORY_SURGERY_CENTER): Payer: No Typology Code available for payment source | Admitting: Internal Medicine

## 2021-10-29 ENCOUNTER — Encounter: Payer: Self-pay | Admitting: Internal Medicine

## 2021-10-29 ENCOUNTER — Other Ambulatory Visit: Payer: Self-pay

## 2021-10-29 VITALS — BP 130/70 | HR 56 | Temp 97.5°F | Resp 12 | Ht 70.0 in | Wt 168.0 lb

## 2021-10-29 DIAGNOSIS — K2210 Ulcer of esophagus without bleeding: Secondary | ICD-10-CM

## 2021-10-29 DIAGNOSIS — Z8719 Personal history of other diseases of the digestive system: Secondary | ICD-10-CM | POA: Diagnosis not present

## 2021-10-29 DIAGNOSIS — K317 Polyp of stomach and duodenum: Secondary | ICD-10-CM | POA: Diagnosis not present

## 2021-10-29 DIAGNOSIS — K22719 Barrett's esophagus with dysplasia, unspecified: Secondary | ICD-10-CM

## 2021-10-29 DIAGNOSIS — C159 Malignant neoplasm of esophagus, unspecified: Secondary | ICD-10-CM

## 2021-10-29 MED ORDER — SODIUM CHLORIDE 0.9 % IV SOLN: 500.0000 mL | INTRAVENOUS | Status: DC

## 2021-10-29 NOTE — Progress Notes (Signed)
GASTROENTEROLOGY PROCEDURE H&P NOTE   Primary Care Physician: Derinda Late, MD    Reason for Procedure:  Surveillance at the request of oncology given the patient's personal history of esophageal adenocarcinoma arising in the setting of Barrett's esophagus.  Plan:    EGD  Patient is appropriate for endoscopic procedure(s) in the ambulatory ([facility]) setting.  The nature of the procedure, as well as the risks, benefits, and alternatives were carefully and thoroughly reviewed with the patient. Ample time for discussion and questions allowed. The patient understood, was satisfied, and agreed to proceed.     HPI: Sean Garner. is a 75 y.o. male who presents for EGD.  Medical history as below.  Esophageal cancer diagnosis 1 year ago in the [facility].  Staged as 1B adenocarcinoma of the GE junction.  He has been treated with chemotherapy with good treatment response per imaging.  He is also on Eliquis which has been on hold x2 days.  No recent chest pain or shortness of breath.  Past Medical History:  Diagnosis Date   Anxiety    Cancer ([place])    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   Claustrophobia    Diabetes mellitus without complication ([place])    Esophageal adenocarcinoma ([facility]) 04/21/2021   GERD (gastroesophageal reflux disease)    Glaucoma    using gtts for this.   Hypercholesterolemia    Hypertension     Past Surgical History:  Procedure Laterality Date   CATARACT EXTRACTION W/PHACO Left 12/03/2017   Procedure: CATARACT EXTRACTION PHACO AND INTRAOCULAR LENS PLACEMENT ([place]) COMPLICATED DIABETIC LEFT;  Surgeon: Leandrew Koyanagi, MD;  Location: [facility];  Service: Ophthalmology;  Laterality: Left;  Diabetic - oral meds   CHOLECYSTECTOMY     COLONOSCOPY  2016   PORTA CATH INSERTION N/A 11/13/2020   Procedure: PORTA CATH INSERTION;  Surgeon: Algernon Huxley, MD;  Location: [facility];   Service: Cardiovascular;  Laterality: N/A;    Prior to Admission medications   Medication Sig Start Date End Date Taking? Authorizing Provider  brimonidine-timolol (COMBIGAN) 0.2-0.5 % ophthalmic solution Place 1 drop into the left eye daily. 06/09/19  Yes [provider]  calcium carbonate (OS-CAL) 600 MG TABS tablet Take by mouth.   Yes [provider]  citalopram (CELEXA) 10 MG tablet TAKE 1 TABLET BY MOUTH EVERY DAY 10/08/21  Yes Ursula Alert, MD  glucose blood test strip  05/06/17  Yes [provider]  magnesium chloride (SLOW-MAG) 64 MG TBEC SR tablet Take 1 tablet (64 mg total) by mouth 2 (two) times daily. 06/27/21  Yes Earlie Server, MD  metFORMIN (GLUCOPHAGE-XR) 500 MG 24 hr tablet Take 500 mg by mouth 2 (two) times daily. Takes 2 tablets and 1 tablet in the pm   Yes [provider]  pantoprazole (PROTONIX) 40 MG tablet Take by mouth.   Yes [provider]  simvastatin (ZOCOR) 40 MG tablet Take 40 mg by mouth daily at 6 PM.   Yes [provider]  traZODone (DESYREL) 50 MG tablet Take 1 tablet (50 mg total) by mouth at bedtime as needed for sleep. 10/22/21  Yes Ursula Alert, MD  vitamin B-12 (CYANOCOBALAMIN) 1000 MCG tablet Take 1,000 mcg by mouth daily.   Yes [provider]  VITAMIN D PO Take 800 Units by mouth daily.   Yes [provider]  ALPRAZolam Duanne Moron)  0.5 MG tablet Take 1 tablet (0.5 mg total) by mouth 3 (three) times daily as needed for anxiety. 07/26/21   Borders, Kirt Boys, NP  APIXABAN Arne [place]) VTE STARTER PACK (10MG  AND 5MG ) Take as directed on package: start with two-5mg  tablets twice daily for 7 days. On day 8 (01/29/2021), switch to one-5mg  tablet twice daily. 01/22/21   Nolberto Hanlon, MD  glipiZIDE (GLUCOTROL XL) 5 MG 24 hr tablet Take 5 mg by mouth 2 (two) times daily. Patient not taking: Reported on 10/29/2021 05/26/18   [provider]  magic mouthwash w/lidocaine SOLN Take 5 mLs by  mouth. Patient not taking: Reported on 10/29/2021    [provider]    Current Outpatient Medications  Medication Sig Dispense Refill   brimonidine-timolol (COMBIGAN) 0.2-0.5 % ophthalmic solution Place 1 drop into the left eye daily.     calcium carbonate (OS-CAL) 600 MG TABS tablet Take by mouth.     citalopram (CELEXA) 10 MG tablet TAKE 1 TABLET BY MOUTH EVERY DAY 90 tablet 0   glucose blood test strip      magnesium chloride (SLOW-MAG) 64 MG TBEC SR tablet Take 1 tablet (64 mg total) by mouth 2 (two) times daily. 60 tablet 1   metFORMIN (GLUCOPHAGE-XR) 500 MG 24 hr tablet Take 500 mg by mouth 2 (two) times daily. Takes 2 tablets and 1 tablet in the pm     pantoprazole (PROTONIX) 40 MG tablet Take by mouth.     simvastatin (ZOCOR) 40 MG tablet Take 40 mg by mouth daily at 6 PM.     traZODone (DESYREL) 50 MG tablet Take 1 tablet (50 mg total) by mouth at bedtime as needed for sleep. 90 tablet 1   vitamin B-12 (CYANOCOBALAMIN) 1000 MCG tablet Take 1,000 mcg by mouth daily.     VITAMIN D PO Take 800 Units by mouth daily.     ALPRAZolam (XANAX) 0.5 MG tablet Take 1 tablet (0.5 mg total) by mouth 3 (three) times daily as needed for anxiety. 30 tablet 0   APIXABAN (ELIQUIS) VTE STARTER PACK (10MG  AND 5MG ) Take as directed on package: start with two-5mg  tablets twice daily for 7 days. On day 8 (01/29/2021), switch to one-5mg  tablet twice daily. 1 tablet 0   glipiZIDE (GLUCOTROL XL) 5 MG 24 hr tablet Take 5 mg by mouth 2 (two) times daily. (Patient not taking: Reported on 10/29/2021)  1   magic mouthwash w/lidocaine SOLN Take 5 mLs by mouth. (Patient not taking: Reported on 10/29/2021)     Current Facility-Administered Medications  Medication Dose Route Frequency Provider Last Rate Last Admin   0.9 %  sodium chloride infusion  500 mL Intravenous Once Patrice Moates, Lajuan Lines, MD       0.9 %  sodium chloride infusion  500 mL Intravenous Continuous Deano Tomaszewski, Lajuan Lines, MD       Facility-Administered  Medications Ordered in Other Visits  Medication Dose Route Frequency Provider Last Rate Last Admin   0.9 % NaCl with KCl 20 mEq/ L  infusion   Intravenous Once Corcoran, Melissa C, MD       heparin lock flush 100 UNIT/ML injection            heparin lock flush 100 unit/mL  500 Units Intravenous Once Earlie Server, MD       magnesium sulfate IVPB 2 g 50 mL  2 g Intravenous Once Corcoran, Melissa C, MD       sodium chloride flush (NS) 0.9 % injection  10 mL  10 mL Intravenous Once Earlie Server, MD        Allergies as of 10/29/2021   (No Known Allergies)    Family History  Problem Relation Age of Onset   Cancer Mother    Colon cancer Neg Hx    Colon polyps Neg Hx    Esophageal cancer Neg Hx    Rectal cancer Neg Hx    Stomach cancer Neg Hx     Social History   Socioeconomic History   Marital status: Married    Spouse name: Not on file   Number of children: Not on file   Years of education: Not on file   Highest education level: Not on file  Occupational History   Not on file  Tobacco Use   Smoking status: Former    Types: Cigarettes    Quit date: 37    Years since quitting: 36.9   Smokeless tobacco: Never  Vaping Use   Vaping Use: Never used  Substance and Sexual Activity   Alcohol use: Not Currently    Comment: occasional - 1 glass wine/month   Drug use: Never   Sexual activity: Not on file  Other Topics Concern   Not on file  Social History Narrative   Not on file   Social Determinants of Health   Financial Resource Strain: Not on file  Food Insecurity: Not on file  Transportation Needs: Not on file  Physical Activity: Not on file  Stress: Not on file  Social Connections: Not on file  Intimate Partner Violence: Not on file    Physical Exam: Vital signs in last 24 hours: @BP  136/63   Pulse (!) 56   Temp (!) 97.5 F (36.4 C) (Temporal)   Ht 5\' 10"  (1.778 m)   Wt 168 lb (76.2 kg)   SpO2 98%   BMI 24.11 kg/m  GEN: NAD EYE: Sclerae anicteric ENT: MMM CV:  Non-tachycardic Pulm: CTA b/l GI: Soft, NT/ND NEURO:  Alert & Oriented x 3   Zenovia Jarred, MD [facility]  10/29/2021 9:51 AM

## 2021-10-29 NOTE — Patient Instructions (Addendum)
Handouts were given to your care partner on GERD, a Hiatal Hernia, Barrett's esophagus. Your blood sugar was 138 in the recovery room. Resume your blood thinner, ELIQUIS, at prior dose this evening. Continue PANTOPRAZOLE 40 mg daily.  You may resume your other current medications today. Await biopsy results.  May take 1-3 weeks to receive pathology results. Please call if any questions or concerns.     YOU HAD AN ENDOSCOPIC PROCEDURE TODAY AT [facility]:   Refer to the procedure report that was given to you for any specific questions about what was found during the examination.  If the procedure report does not answer your questions, please call your gastroenterologist to clarify.  If you requested that your care partner not be given the details of your procedure findings, then the procedure report has been included in a sealed envelope for you to review at your convenience later.  YOU SHOULD EXPECT: Some feelings of bloating in the abdomen. Passage of more gas than usual.  Walking can help get rid of the air that was put into your GI tract during the procedure and reduce the bloating. If you had a lower endoscopy (such as a colonoscopy or flexible sigmoidoscopy) you may notice spotting of blood in your stool or on the toilet paper. If you underwent a bowel prep for your procedure, you may not have a normal bowel movement for a few days.  Please Note:  You might notice some irritation and congestion in your nose or some drainage.  This is from the oxygen used during your procedure.  There is no need for concern and it should clear up in a day or so.  SYMPTOMS TO REPORT IMMEDIATELY:   Following upper endoscopy (EGD)  Vomiting of blood or coffee ground material  New chest pain or pain under the shoulder blades  Painful or persistently difficult swallowing  New shortness of breath  Fever of 100F or higher  Black, tarry-looking stools  For urgent or emergent issues, a  gastroenterologist can be reached at any hour by calling [phone]. Do not use MyChart messaging for urgent concerns.    DIET:  We do recommend a small meal at first, but then you may proceed to your regular diet.  Drink plenty of fluids but you should avoid alcoholic beverages for 24 hours.  ACTIVITY:  You should plan to take it easy for the rest of today and you should NOT DRIVE or use heavy machinery until tomorrow (because of the sedation medicines used during the test).    FOLLOW UP: Our staff will call the number listed on your records 48-72 hours following your procedure to check on you and address any questions or concerns that you may have regarding the information given to you following your procedure. If we do not reach you, we will leave a message.  We will attempt to reach you two times.  During this call, we will ask if you have developed any symptoms of COVID 19. If you develop any symptoms (ie: fever, flu-like symptoms, shortness of breath, cough etc.) before then, please call [phone].  If you test positive for Covid 19 in the 2 weeks post procedure, please call and report this information to [place].    If any biopsies were taken you will be contacted by phone or by letter within the next 1-3 weeks.  Please call us at [phone] if you have not heard about the biopsies in 3 weeks.    SIGNATURES/CONFIDENTIALITY:  You and/or your care partner have signed paperwork which will be entered into your electronic medical record.  These signatures attest to the fact that that the information above on your After Visit Summary has been reviewed and is understood.  Full responsibility of the confidentiality of this discharge information lies with you and/or your care-partner.

## 2021-10-29 NOTE — Op Note (Signed)
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 The patient's tolerance of                            previous anesthesia was also reviewed. The risks                            and benefits of the procedure and the sedation                            options and risks were discussed with the patient.                            All questions were answered, and informed consent                            was obtained. Prior Anticoagulants: The patient has                            taken Eliquis (apixaban), last dose was 2 days                            prior to procedure. ASA Grade Assessment: II - A                            patient with mild systemic disease. After reviewing                            the risks and benefits, the patient was deemed in  satisfactory condition to undergo the procedure.                           After obtaining informed consent, the endoscope was                            passed under direct vision. Throughout the                            procedure, the patient's blood pressure, pulse, and                            oxygen saturations were monitored continuously. The                            Endoscope was  introduced through the mouth, and                            advanced to the second part of duodenum. The upper                            GI endoscopy was accomplished without difficulty.                            The patient tolerated the procedure well. Scope In: Scope Out: Findings:                 The esophagus and gastroesophageal junction were                            examined with white light and narrow band imaging                            (NBI) from a forward view and retroflexed position.                            There were esophageal mucosal changes consistent                            with short-segment Barrett's esophagus. These                            changes involved the mucosa at the upper extent of                            the gastric folds (39 cm from the incisors)                            extending to the Z-line (36 cm from the incisors).                            Circumferential salmon-colored mucosa was present  from 37 to 39 cm and scattered islands of                            salmon-colored mucosa were present from 36 to 37                            cm. The maximum longitudinal extent of these                            esophageal mucosal changes was 3 cm in length.                            Mucosa was biopsied with a cold forceps for                            histology in a targeted manner at intervals of 0.5                            cm from 36 to 39 cm from the incisors. One specimen                            bottle was sent to pathology. No esophageal mass or                            tumor seen today.                           A 2 cm hiatal hernia was present.                           A single 9 mm sessile polyp was found in the                            gastric body. Biopsies were taken with a cold                            forceps for histology.                           The examined duodenum was  normal. Complications:            No immediate complications. Estimated Blood Loss:     Estimated blood loss was minimal. Impression:               - No evidence of residual esophageal tumor today.                            Esophageal mucosal changes consistent with                            short-segment Barrett's esophagus. Biopsied.                           - 2 cm hiatal hernia.                           -  A single gastric polyp. Biopsied.                           - Normal examined duodenum. Recommendation:           - Patient has a contact number available for                            emergencies. The signs and symptoms of potential                            delayed complications were discussed with the                            patient. Return to normal activities tomorrow.                            Written discharge instructions were provided to the                            patient.                           - Resume previous diet.                           - Continue present medications including                            pantoprazole 40 mg once daily.                           - Resume Eliquis (apixaban) at prior dose this                            evening. Refer to managing physician for further                            adjustment of therapy.                           - Await pathology results.                           - Repeat upper endoscopy for surveillance based on                            pathology results. Jerene Bears, MD 10/29/2021 10:21:03 AM This report has been signed electronically.

## 2021-10-29 NOTE — Progress Notes (Signed)
Called to room to assist during endoscopic procedure.  Patient ID and intended procedure confirmed with present staff. Received instructions for my participation in the procedure from the performing physician.  

## 2021-10-29 NOTE — Progress Notes (Signed)
Report given to PACU, vss 

## 2021-10-29 NOTE — Progress Notes (Signed)
0958 Robinul 0.1 mg IV given due large amount of secretions upon assessment.  MD made aware, vss

## 2021-10-29 NOTE — Progress Notes (Signed)
No problems noted in the recovery room. maw 

## 2021-10-31 ENCOUNTER — Telehealth: Payer: Self-pay | Admitting: *Deleted

## 2021-10-31 NOTE — Telephone Encounter (Signed)
Message left

## 2021-10-31 NOTE — Telephone Encounter (Signed)
°  Follow up Call-  Call back number 10/29/2021 11/03/2020  Post procedure Call Back phone  # [phone]  Permission to leave phone message Yes Yes  Some recent data might be hidden    [facility]

## 2021-11-07 ENCOUNTER — Other Ambulatory Visit: Payer: Self-pay | Admitting: Psychiatry

## 2021-11-07 DIAGNOSIS — F411 Generalized anxiety disorder: Secondary | ICD-10-CM

## 2021-11-07 DIAGNOSIS — F5101 Primary insomnia: Secondary | ICD-10-CM

## 2021-11-08 ENCOUNTER — Other Ambulatory Visit: Payer: Self-pay | Admitting: *Deleted

## 2021-11-08 NOTE — Telephone Encounter (Signed)
Received fax from [facility] to refill Mirtazapine, but this medicine was discontinued in September stating patient preference. I called and spoke with Sean Garner who in fact reported that he is NOT taking this medicine. Will not refill it.

## 2021-12-07 ENCOUNTER — Other Ambulatory Visit: Payer: Self-pay | Admitting: Psychiatry

## 2021-12-07 DIAGNOSIS — F5101 Primary insomnia: Secondary | ICD-10-CM

## 2021-12-07 DIAGNOSIS — F411 Generalized anxiety disorder: Secondary | ICD-10-CM

## 2021-12-12 ENCOUNTER — Inpatient Hospital Stay: Payer: Medicare Other | Attending: Oncology

## 2021-12-12 ENCOUNTER — Other Ambulatory Visit: Payer: Self-pay

## 2021-12-12 DIAGNOSIS — C159 Malignant neoplasm of esophagus, unspecified: Secondary | ICD-10-CM

## 2021-12-12 DIAGNOSIS — C01 Malignant neoplasm of base of tongue: Secondary | ICD-10-CM | POA: Insufficient documentation

## 2021-12-12 DIAGNOSIS — C160 Malignant neoplasm of cardia: Secondary | ICD-10-CM | POA: Diagnosis not present

## 2021-12-12 DIAGNOSIS — Z95828 Presence of other vascular implants and grafts: Secondary | ICD-10-CM

## 2021-12-12 LAB — COMPREHENSIVE METABOLIC PANEL
ALT: 21 U/L (ref 0–44)
AST: 23 U/L (ref 15–41)
Albumin: 4.1 g/dL (ref 3.5–5.0)
Alkaline Phosphatase: 52 U/L (ref 38–126)
Anion gap: 7 (ref 5–15)
BUN: 16 mg/dL (ref 8–23)
CO2: 29 mmol/L (ref 22–32)
Calcium: 9.2 mg/dL (ref 8.9–10.3)
Chloride: 101 mmol/L (ref 98–111)
Creatinine, Ser: 0.82 mg/dL (ref 0.61–1.24)
GFR, Estimated: >60 mL/min (ref >60)
Glucose, Bld: 156 mg/dL — ABNORMAL HIGH (ref 70–99)
Potassium: 3.8 mmol/L (ref 3.5–5.1)
Sodium: 137 mmol/L (ref 135–145)
Total Bilirubin: 0.9 mg/dL (ref 0.3–1.2)
Total Protein: 6.8 g/dL (ref 6.5–8.1)

## 2021-12-12 LAB — CBC WITH DIFFERENTIAL/PLATELET
Abs Immature Granulocytes: 0.01 10*3/uL (ref 0.00–0.07)
Basophils Absolute: 0 10*3/uL (ref 0.0–0.1)
Basophils Relative: 0 %
Eosinophils Absolute: 0 10*3/uL (ref 0.0–0.5)
Eosinophils Relative: 1 %
HCT: 38.5 % — ABNORMAL LOW (ref 39.0–52.0)
Hemoglobin: 13.5 g/dL (ref 13.0–17.0)
Immature Granulocytes: 0 %
Lymphocytes Relative: 10 %
Lymphs Abs: 0.4 10*3/uL — ABNORMAL LOW (ref 0.7–4.0)
MCH: 30.8 pg (ref 26.0–34.0)
MCHC: 35.1 g/dL (ref 30.0–36.0)
MCV: 87.7 fL (ref 80.0–100.0)
Monocytes Absolute: 0.3 10*3/uL (ref 0.1–1.0)
Monocytes Relative: 7 %
Neutro Abs: 3.6 10*3/uL (ref 1.7–7.7)
Neutrophils Relative %: 82 %
Platelets: 146 10*3/uL — ABNORMAL LOW (ref 150–400)
RBC: 4.39 MIL/uL (ref 4.22–5.81)
RDW: 12.9 % (ref 11.5–15.5)
WBC: 4.4 10*3/uL (ref 4.0–10.5)
nRBC: 0 % (ref 0.0–0.2)

## 2021-12-12 LAB — MAGNESIUM: Magnesium: 1.9 mg/dL (ref 1.7–2.4)

## 2021-12-12 MED ORDER — HEPARIN SOD (PORK) LOCK FLUSH 100 UNIT/ML IV SOLN
500.0000 [IU] | Freq: Once | INTRAVENOUS | Status: AC
  Administered 2021-12-12: 13:00:00 500 [IU] via INTRAVENOUS
  Filled 2021-12-12: qty 5

## 2021-12-12 MED ORDER — SODIUM CHLORIDE 0.9% FLUSH
10.0000 mL | Freq: Once | INTRAVENOUS | Status: AC
Start: 2021-12-12 — End: 2021-12-12
  Administered 2021-12-12: 13:00:00 10 mL via INTRAVENOUS
  Filled 2021-12-12: qty 10

## 2021-12-13 LAB — CEA: CEA: 1.4 ng/mL (ref 0.0–4.7)

## 2021-12-14 ENCOUNTER — Inpatient Hospital Stay (HOSPITAL_BASED_OUTPATIENT_CLINIC_OR_DEPARTMENT_OTHER): Payer: Medicare Other | Admitting: Oncology

## 2021-12-14 ENCOUNTER — Other Ambulatory Visit: Payer: Self-pay

## 2021-12-14 ENCOUNTER — Encounter: Payer: Self-pay | Admitting: Oncology

## 2021-12-14 VITALS — BP 150/69 | HR 58 | Temp 96.9°F | Resp 16 | Wt 172.0 lb

## 2021-12-14 DIAGNOSIS — C01 Malignant neoplasm of base of tongue: Secondary | ICD-10-CM | POA: Diagnosis not present

## 2021-12-14 DIAGNOSIS — I2699 Other pulmonary embolism without acute cor pulmonale: Secondary | ICD-10-CM

## 2021-12-14 DIAGNOSIS — C159 Malignant neoplasm of esophagus, unspecified: Secondary | ICD-10-CM | POA: Diagnosis not present

## 2021-12-14 NOTE — Progress Notes (Signed)
Pt in for follow up, Denies any concerns today.

## 2021-12-14 NOTE — Progress Notes (Signed)
Hematology/Oncology Progress note [phone] [phone]      Clinic Day:  12/14/2021  Referring physician: Derinda Late, MD  Chief Complaint: Sean Garner. is a 76 y.o. 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    09/28/2020 Neck soft tissue CT on  revealed a 3.5 x 3.0 x 4.8 cm right neck mass most c/w enlarged lymph node. There was a 14 mm enhancing mass in the right base of tongue.   PET scan on 10/25/2020 revealed right tongue base primary with ipsilateral level 2 nodal metastasis. There were no findings of extracervical metastatic disease. There was focal hypermetabolism in the distal esophagus, with possible concurrent soft tissue density lesion. Recommended correlation with endoscopy. There was vague right lower lobe low-level hypermetabolism and ground-glass nodularity, favored minimal infection or aspiration. Incidental findings included aortic atherosclerosis, coronary artery atherosclerosis, emphysema, a tiny hiatal hernia, and prostatomegaly. 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  Speech discrimination scores were 100% in each ear.  11/03/2020 Upper endoscopy on  revealed a malignant esophageal tumor at the gastroesophageal junction. There were esophageal mucosal changes c/w short-segment Barrett's esophagus. There was a 2 cm hiatal hernia. There was gastritis.  Pathology in the esophagus at 36 cm revealed intramucosal  adenocarcinoma arising in a background of high grade dysplasia and intestinal metaplasia; pathology at 38 cm revealed adenocarcinoma.  11/15/2020 EUS on  revealed early stage adenocarcinoma arising from Barrett's esophagus.  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   11/20/2020 - 12/27/2020  6 weeks of cisplatin with concurrent radiation  completed radiation on 01/11/2021.  01/19/2021 Chest abdomen and pelvis CT with contrast on  revealed extensive nearly occlusive pulmonary thromboembolic disease bilaterally with CT evidence of right heart strain c/w at least submassive (intermediate risk) PE. There was no evidence of metastatic disease within the chest, abdomen or pelvis.  Improvement in previously demonstrated distal esophageal wall thickening without apparent focal mass lesion. There was aortic atherosclerosis and emphysema. 01/20/2021 Bilateral lower extremity duplex on  revealed occlusive DVT in the right deep femoral vein. Started on anticoagulation with heparin drip and discharged off Eliquis. Prothrombin gene mutation and factor 5 leiden were both negative  He has PTSD and claustrophobia.  Anxiety was relieved by alprazolam prior to radiation.  He is on citalopram 30 mg a day and trazodone at night.  He was admitted to [facility] from 01/19/2021 - 01/22/2021 with bilateral pulmonary emboli. Bilateral lower extremity venous duplex was positive for occlusive DVT in the right deep femoral vein. He was placed on  and discharged on Eliquis.  # Patient canceled her PET scan that was ordered for assessment of treatment response for head and neck cancer. 04/09/2021, patient was seen by Dr. Elenor Quinones and underwent EGD. Polypoid tumor was seen at the GE junction, consistent with the location of the previously visualized tumor.  With no significant difference compared to photos from other endoscopies.  The proximal esophagus is involved.  Esophagectomy will be  applicable. Biopsy was taken at  38 cm.  Dr. Elenor Quinones has reached out radiation oncology Dr. Baruch Gouty and recommend neoadjuvant chemo and radiation.  05/23/2021-07/04/2021 weekly Carboplatin/ taxol  + RT finished 07/09/2021 09/12/2021, PET scan was reviewed independently by me   No signs of increased hypermetabolic activity in the neck to indicate residual head neck cancer.  No hypermetabolism in the esophagus. No signs of metastasis to the neck, chest, abdomen or pelvis.  INTERVAL HISTORY Sean Garner. is a 76 y.o. 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  10/29/2021, EGD showed no evidence of residual esophageal tumor today.  Esophageal mucosal changes consistent with short segment Barrett's esophagus.  Biopsied.  2 cm hiatal hernia.  A single gastric polyp, biopsied.  Pathology showed gastric hyperplastic polyps, Barrett's esophagus, low-grade dysplasia, focally suspicious for high-grade dysplasia.  Evidence of invasive carcinoma was not seen in the submitted biopsies.    Past Medical History:  Diagnosis Date   Anxiety    Cancer ([place])    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   Claustrophobia    Diabetes mellitus without complication ([place])    Esophageal adenocarcinoma ([place]) 04/21/2021   GERD (gastroesophageal reflux disease)    Glaucoma    using gtts for this.   Hypercholesterolemia    Hypertension     Past Surgical History:  Procedure Laterality Date   CATARACT EXTRACTION W/PHACO Left 12/03/2017   Procedure: CATARACT EXTRACTION PHACO AND INTRAOCULAR LENS PLACEMENT ([place]) COMPLICATED DIABETIC LEFT;  Surgeon: Leandrew Koyanagi, MD;  Location: [facility];  Service: Ophthalmology;  Laterality: Left;  Diabetic - oral meds   CHOLECYSTECTOMY      COLONOSCOPY  2016   PORTA CATH INSERTION N/A 11/13/2020   Procedure: PORTA CATH INSERTION;  Surgeon: Algernon Huxley, MD;  Location: [facility];  Service: Cardiovascular;  Laterality: N/A;    Family History  Problem Relation Age of Onset   Cancer Mother    Colon cancer Neg Hx    Colon polyps Neg Hx    Esophageal cancer Neg Hx    Rectal cancer Neg Hx    Stomach cancer Neg Hx     Social History:  reports that he quit smoking about 37 years ago. His smoking use included cigarettes. He has never used smokeless tobacco. He reports that he does not currently use alcohol. He reports that he does not use drugs. He quit smoking cold [place] in 1986. He smoked 2 packs per day for 20 years. 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 He was exposed to [employer]. He worked with a Geologist, engineering for 34 years. The patient is accompanied by his wife today.  Allergies: No Known Allergies  Current Medications: Current Outpatient Medications  Medication Sig Dispense Refill   ALPRAZolam (XANAX) 0.5 MG tablet Take 1 tablet (0.5 mg total) by mouth 3 (three) times daily as needed for anxiety. 30 tablet 0   APIXABAN (ELIQUIS) VTE STARTER PACK (10MG  AND 5MG ) Take as directed on package: start with two-5mg  tablets twice daily for 7 days. On day 8 (01/29/2021), switch to one-5mg  tablet twice daily. 1 tablet 0   brimonidine-timolol (COMBIGAN) 0.2-0.5 % ophthalmic solution Place 1 drop into the left eye daily.  calcium carbonate (OS-CAL) 600 MG TABS tablet Take by mouth.     citalopram (CELEXA) 10 MG tablet TAKE 1 TABLET BY MOUTH EVERY DAY 90 tablet 1   glucose blood test strip      magnesium chloride (SLOW-MAG) 64 MG TBEC SR tablet Take 1 tablet (64 mg total) by mouth 2 (two) times daily. 60 tablet 1   metFORMIN (GLUCOPHAGE-XR) 500 MG 24 hr tablet Take 500 mg by mouth 2 (two) times daily. Takes 2 tablets and 1 tablet in the pm     pantoprazole (PROTONIX) 40 MG tablet Take by mouth.      simvastatin (ZOCOR) 40 MG tablet Take 40 mg by mouth daily at 6 PM.     traZODone (DESYREL) 50 MG tablet Take 1 tablet (50 mg total) by mouth at bedtime as needed for sleep. 90 tablet 1   vitamin B-12 (CYANOCOBALAMIN) 1000 MCG tablet Take 1,000 mcg by mouth daily.     VITAMIN D PO Take 800 Units by mouth daily.     zinc gluconate 50 MG tablet Take 50 mg by mouth daily.     Current Facility-Administered Medications  Medication Dose Route Frequency Provider Last Rate Last Admin   0.9 %  sodium chloride infusion  500 mL Intravenous Once Pyrtle, Lajuan Lines, MD       Facility-Administered Medications Ordered in Other Visits  Medication Dose Route Frequency Provider Last Rate Last Admin   0.9 % NaCl with KCl 20 mEq/ L  infusion   Intravenous Once Corcoran, Melissa C, MD       heparin lock flush 100 UNIT/ML injection            heparin lock flush 100 unit/mL  500 Units Intravenous Once Earlie Server, MD       magnesium sulfate IVPB 2 g 50 mL  2 g Intravenous Once Corcoran, Melissa C, MD       sodium chloride flush (NS) 0.9 % injection 10 mL  10 mL Intravenous Once Earlie Server, MD        Review of Systems  Constitutional: Negative.  Negative for chills, fever, malaise/fatigue and weight loss.  HENT:  Negative for congestion, ear pain and tinnitus.   Eyes: Negative.  Negative for blurred vision and double vision.  Respiratory: Negative.  Negative for cough, sputum production and shortness of breath.   Cardiovascular:  Negative for chest pain and palpitations.  Gastrointestinal: Negative.  Negative for abdominal pain, constipation, diarrhea, nausea and vomiting.  Genitourinary:  Negative for dysuria, frequency and urgency.  Musculoskeletal:  Negative for back pain and falls.  Skin: Negative.  Negative for rash.  Neurological: Negative.  Negative for weakness and headaches.  Endo/Heme/Allergies: Negative.  Does not bruise/bleed easily.  Psychiatric/Behavioral: Negative.  Negative for depression. The patient  is not nervous/anxious and does not have insomnia.   Performance status (ECOG): 1  Vitals Blood pressure (!) 150/69, pulse (!) 58, temperature (!) 96.9 F (36.1 C), temperature source Tympanic, resp. rate 16, weight 172 lb (78 kg), SpO2 96 %.   Physical Exam Constitutional:      General: He is not in acute distress.    Appearance: He is not diaphoretic.  HENT:     Head: Normocephalic and atraumatic.     Nose: Nose normal.     Mouth/Throat:     Pharynx: No oropharyngeal exudate.  Eyes:     General: No scleral icterus.    Pupils: Pupils are equal, round, and reactive to light.  Cardiovascular:  Rate and Rhythm: Normal rate and regular rhythm.     Heart sounds: No murmur heard. Pulmonary:     Effort: Pulmonary effort is normal. No respiratory distress.     Breath sounds: No rales.  Chest:     Chest wall: No tenderness.  Abdominal:     General: There is no distension.     Palpations: Abdomen is soft.     Tenderness: There is no abdominal tenderness.  Musculoskeletal:        General: Normal range of motion.     Cervical back: Normal range of motion and neck supple.  Skin:    General: Skin is warm and dry.     Findings: No erythema.  Neurological:     Mental Status: He is alert and oriented to person, place, and time.     Cranial Nerves: No cranial nerve deficit.     Motor: No abnormal muscle tone.     Coordination: Coordination normal.  Psychiatric:        Mood and Affect: Affect normal.    Infusion on 12/12/2021  Component Date Value Ref Range Status   CEA 12/12/2021 1.4  0.0 - 4.7 ng/mL Final   Comment: (NOTE)                             Nonsmokers          <3.9                             Smokers             <5.6 Roche [employer] Electrochemiluminescence Immunoassay (ECLIA) Values obtained with different assay methods or kits cannot be used interchangeably.  Results cannot be interpreted as absolute evidence of the presence or absence of malignant  disease. Performed At: [facility] [place], [place] [id number] Rush Farmer MD [id number]    Sodium 12/12/2021 137  135 - 145 mmol/L Final   Potassium 12/12/2021 3.8  3.5 - 5.1 mmol/L Final   Chloride 12/12/2021 101  98 - 111 mmol/L Final   CO2 12/12/2021 29  22 - 32 mmol/L Final   Glucose, Bld 12/12/2021 156 (H)  70 - 99 mg/dL Final   Glucose reference range applies only to samples taken after fasting for at least 8 hours.   BUN 12/12/2021 16  8 - 23 mg/dL Final   Creatinine, Ser 12/12/2021 0.82  0.61 - 1.24 mg/dL Final   Calcium 12/12/2021 9.2  8.9 - 10.3 mg/dL Final   Total Protein 12/12/2021 6.8  6.5 - 8.1 g/dL Final   Albumin 12/12/2021 4.1  3.5 - 5.0 g/dL Final   AST 12/12/2021 23  15 - 41 U/L Final   ALT 12/12/2021 21  0 - 44 U/L Final   Alkaline Phosphatase 12/12/2021 52  38 - 126 U/L Final   Total Bilirubin 12/12/2021 0.9  0.3 - 1.2 mg/dL Final   GFR, Estimated 12/12/2021 >60  >60 mL/min Final   Comment: (NOTE) Calculated using the CKD-EPI Creatinine Equation (2021)    Anion gap 12/12/2021 7  5 - 15 Final   Performed at [facility], [place], [place] [address]   WBC 12/12/2021 4.4  4.0 - 10.5 K/uL Final   RBC 12/12/2021 4.39  4.22 - 5.81 MIL/uL Final   Hemoglobin 12/12/2021 13.5  13.0 - 17.0 g/dL Final   HCT 12/12/2021  38.5 (L)  39.0 - 52.0 % Final   MCV 12/12/2021 87.7  80.0 - 100.0 fL Final   MCH 12/12/2021 30.8  26.0 - 34.0 pg Final   MCHC 12/12/2021 35.1  30.0 - 36.0 g/dL Final   RDW 12/12/2021 12.9  11.5 - 15.5 % Final   Platelets 12/12/2021 146 (L)  150 - 400 K/uL Final   nRBC 12/12/2021 0.0  0.0 - 0.2 % Final   Neutrophils Relative % 12/12/2021 82  % Final   Neutro Abs 12/12/2021 3.6  1.7 - 7.7 K/uL Final   Lymphocytes Relative 12/12/2021 10  % Final   Lymphs Abs 12/12/2021 0.4 (L)  0.7 - 4.0 K/uL Final   Monocytes Relative 12/12/2021 7  % Final   Monocytes Absolute 12/12/2021 0.3  0.1 - 1.0 K/uL Final    Eosinophils Relative 12/12/2021 1  % Final   Eosinophils Absolute 12/12/2021 0.0  0.0 - 0.5 K/uL Final   Basophils Relative 12/12/2021 0  % Final   Basophils Absolute 12/12/2021 0.0  0.0 - 0.1 K/uL Final   Immature Granulocytes 12/12/2021 0  % Final   Abs Immature Granulocytes 12/12/2021 0.01  0.00 - 0.07 K/uL Final   Performed at [facility], [address] [place], [place] [address]   Magnesium 12/12/2021 1.9  1.7 - 2.4 mg/dL Final   Performed at [facility], [address] [place], [place] [address]    Assessment/Plan.  1. Esophageal adenocarcinoma ([place])   2. Carcinoma of base of tongue ([place])   3. Pulmonary embolism, bilateral ([facility])     Cancer Staging  Carcinoma of base of tongue ([place]) Staging form: Pharynx - HPV-Mediated Oropharynx, AJCC 8th Edition - Clinical stage from 10/11/2020: Stage I (cT1, cN1, cM0, p16+) - Unsigned  # Clinical stage I right base of tongue carcinoma, Stage I, P16 positive.  He completed concurrent radiation and cisplatin on 01/11/2021.  Continue follow-up with ENT for surveillance.  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 10/29/2021, EGD showed no invasive esophagus carcinoma.  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 Dr. Hilarie Fredrickson recommends that patient may need radiofrequency ablation of his Barrett esophagus.  I further discussed with patient's [name] surgeon Dr. Elenor Quinones who recommends to repeat EGD every 3 months for the first year followed by EGD every 6 months for the second year after treatments.  If biopsy found EGD in 3 months showed persistent high-grade dysplastic, he recommends radiofrequency ablation.  In basket message sent to Dr.Pyrtle.   # Bilateral pulmonary emboli and RLE DVT - 01/2021 Continue Eliquis to 2.5 mg twice daily for prophylaxis.   I discussed  the assessment and treatment plan with the patient.  The patient was provided an opportunity to ask questions and all were answered.  The patient agreed with the plan and demonstrated an understanding of the instructions.  The patient was advised to call back if the symptoms worsen or if the condition fails to improve as anticipated.  Follow up  in 3 months.  Repeat PET scan   Earlie Server,  12/14/2021

## 2021-12-17 ENCOUNTER — Other Ambulatory Visit: Payer: Self-pay

## 2021-12-17 ENCOUNTER — Telehealth: Payer: Self-pay

## 2021-12-17 DIAGNOSIS — C159 Malignant neoplasm of esophagus, unspecified: Secondary | ICD-10-CM

## 2021-12-17 DIAGNOSIS — K22719 Barrett's esophagus with dysplasia, unspecified: Secondary | ICD-10-CM

## 2021-12-24 ENCOUNTER — Ambulatory Visit: Payer: No Typology Code available for payment source | Admitting: Radiation Oncology

## 2021-12-25 ENCOUNTER — Telehealth: Payer: Self-pay | Admitting: Internal Medicine

## 2021-12-25 NOTE — Telephone Encounter (Signed)
Inbound call from patient, stated that he needs a pre authorization for the [place] for his procedure on 3/13. Please advise.   Needs to be sent to :  Cindie Crumbly  [phone]

## 2021-12-25 NOTE — Telephone Encounter (Signed)
Amy do you take care of this?

## 2021-12-25 NOTE — Telephone Encounter (Signed)
Sean Garner, Es typically works on the [place] cases because of my crazy volume.  I think when there are "additional services needed" there is a form that comes with the patient's paperwork that needs to be sent with records.  I think Janett Billow [facility]) has helped with those before.  Check with her on the form they send.

## 2021-12-25 NOTE — Telephone Encounter (Signed)
Please see note below and advise  

## 2021-12-25 NOTE — Telephone Encounter (Signed)
Form faxed to the [place] requesting prior auth for EGD scheduled for 01/28/22.

## 2021-12-26 NOTE — Telephone Encounter (Signed)
FYI

## 2022-01-02 ENCOUNTER — Encounter: Payer: Self-pay | Admitting: Radiation Oncology

## 2022-01-02 ENCOUNTER — Ambulatory Visit
Admission: RE | Admit: 2022-01-02 | Discharge: 2022-01-02 | Disposition: A | Discharge status: Home / Self Care | Payer: No Typology Code available for payment source | Source: Ambulatory Visit | Attending: Radiation Oncology | Admitting: Radiation Oncology

## 2022-01-02 ENCOUNTER — Other Ambulatory Visit: Payer: Self-pay

## 2022-01-02 VITALS — BP 162/84 | HR 57 | Resp 16 | Wt 172.9 lb

## 2022-01-02 DIAGNOSIS — C159 Malignant neoplasm of esophagus, unspecified: Secondary | ICD-10-CM

## 2022-01-02 DIAGNOSIS — Z923 Personal history of irradiation: Secondary | ICD-10-CM | POA: Diagnosis not present

## 2022-01-02 DIAGNOSIS — K22710 Barrett's esophagus with low grade dysplasia: Secondary | ICD-10-CM | POA: Diagnosis not present

## 2022-01-02 DIAGNOSIS — Z8719 Personal history of other diseases of the digestive system: Secondary | ICD-10-CM | POA: Insufficient documentation

## 2022-01-02 DIAGNOSIS — C160 Malignant neoplasm of cardia: Secondary | ICD-10-CM | POA: Diagnosis not present

## 2022-01-02 DIAGNOSIS — C01 Malignant neoplasm of base of tongue: Secondary | ICD-10-CM

## 2022-01-02 NOTE — Progress Notes (Signed)
Radiation Oncology Follow up Note  Name: Sean Garner.   Date:   01/02/2022 MRN:  [id number] DOB: [birth year]    This 76 y.o. 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  He is seen today in routine follow-up is doing well he states he is feels better than he has in a long time he is having no dysphagia head and neck pain.  He states his taste is still somewhat off..  He had a PET scan back in October which I have reviewed showing no signs of hypermetabolic activity in the head and neck region.  He also had no evidence of hypermetabolic to be in the esophagus.  There was no evidence of metastatic disease.  He did have an EGD back in December showing Barrett's esophagus with low-grade dysplasia focally suspicious for high-grade dysplasia.  COMPLICATIONS OF TREATMENT: none  FOLLOW UP COMPLIANCE: keeps appointments   PHYSICAL EXAM:  BP (!) 162/84 (BP Location: Left Arm, Patient Position: Sitting)    Pulse (!) 57    Resp 16    Wt 172 lb 14.4 oz (78.4 kg)    BMI 24.81 kg/m  Well-developed well-nourished patient in NAD. HEENT reveals PERLA, EOMI, discs not visualized.  Oral cavity is clear. No oral mucosal lesions are identified. Neck is clear without evidence of cervical or supraclavicular adenopathy. Lungs are clear to A&P. Cardiac examination is essentially unremarkable with regular rate and rhythm without murmur rub or thrill. Abdomen is benign with no organomegaly or masses noted. Motor sensory and DTR levels are equal and symmetric in the  upper and lower extremities. Cranial nerves II through XII are grossly intact. Proprioception is intact. No peripheral adenopathy or edema is identified. No motor or sensory levels are noted. Crude visual fields are within normal range.  RADIOLOGY RESULTS: PET scan reviewed compatible with above-stated findings  PLAN: Present time patient is doing well with no evidence of disease both in the head and neck region or distal esophagus.  They have recommended close surveillance of the distal esophagus and possible radiofrequency ablation should there be any evidence to suggest malignancy.  I am pleased with his overall progress.  I assured him his taste will return to normal.  I have asked to see him back in 6 months for follow-up.  Patient knows to call with any concerns.  I would like to take this opportunity to thank you for allowing me to participate in the care of your patient.Noreene Filbert, MD

## 2022-01-28 ENCOUNTER — Encounter: Payer: Self-pay | Admitting: Hematology and Oncology

## 2022-01-28 ENCOUNTER — Encounter: Payer: Self-pay | Admitting: Oncology

## 2022-01-28 ENCOUNTER — Ambulatory Visit (AMBULATORY_SURGERY_CENTER): Payer: No Typology Code available for payment source | Admitting: Internal Medicine

## 2022-01-28 ENCOUNTER — Encounter: Payer: Self-pay | Admitting: Internal Medicine

## 2022-01-28 VITALS — BP 105/51 | HR 52 | Temp 98.0°F | Resp 12 | Ht 70.0 in | Wt 173.0 lb

## 2022-01-28 DIAGNOSIS — Z8501 Personal history of malignant neoplasm of esophagus: Secondary | ICD-10-CM

## 2022-01-28 DIAGNOSIS — K22711 Barrett's esophagus with high grade dysplasia: Secondary | ICD-10-CM | POA: Diagnosis not present

## 2022-01-28 DIAGNOSIS — K449 Diaphragmatic hernia without obstruction or gangrene: Secondary | ICD-10-CM | POA: Diagnosis not present

## 2022-01-28 DIAGNOSIS — K22719 Barrett's esophagus with dysplasia, unspecified: Secondary | ICD-10-CM

## 2022-01-28 DIAGNOSIS — K2270 Barrett's esophagus without dysplasia: Secondary | ICD-10-CM | POA: Diagnosis not present

## 2022-01-28 MED ORDER — SODIUM CHLORIDE 0.9 % IV SOLN: 500.0000 mL | INTRAVENOUS | Status: DC

## 2022-01-28 NOTE — Patient Instructions (Signed)
Await pathology from the biopsies taken today. ? ?Resume Eliquis tomorrow at previous dose per prescribing provider. ? ?Resume previous diet and medications. ? ?Repeat upper endoscopy in 3 months for surveillance. ? ? ?YOU HAD AN ENDOSCOPIC PROCEDURE TODAY AT [facility]:   Refer to the procedure report that was given to you for any specific questions about what was found during the examination.  If the procedure report does not answer your questions, please call your gastroenterologist to clarify.  If you requested that your care partner not be given the details of your procedure findings, then the procedure report has been included in a sealed envelope for you to review at your convenience later. ? ?YOU SHOULD EXPECT: Some feelings of bloating in the abdomen. Passage of more gas than usual.  Walking can help get rid of the air that was put into your GI tract during the procedure and reduce the bloating. If you had a lower endoscopy (such as a colonoscopy or flexible sigmoidoscopy) you may notice spotting of blood in your stool or on the toilet paper. If you underwent a bowel prep for your procedure, you may not have a normal bowel movement for a few days. ? ?Please Note:  You might notice some irritation and congestion in your nose or some drainage.  This is from the oxygen used during your procedure.  There is no need for concern and it should clear up in a day or so. ? ?SYMPTOMS TO REPORT IMMEDIATELY: ? ?Following upper endoscopy (EGD) ? Vomiting of blood or coffee ground material ? New chest pain or pain under the shoulder blades ? Painful or persistently difficult swallowing ? New shortness of breath ? Fever of 100?F or higher ? Black, tarry-looking stools ? ?For urgent or emergent issues, a gastroenterologist can be reached at any hour by calling [phone]. ?Do not use MyChart messaging for urgent concerns.  ? ? ?DIET:  We do recommend a small meal at first, but then you may proceed to  your regular diet.  Drink plenty of fluids but you should avoid alcoholic beverages for 24 hours. ? ?ACTIVITY:  You should plan to take it easy for the rest of today and you should NOT DRIVE or use heavy machinery until tomorrow (because of the sedation medicines used during the test).   ? ?FOLLOW UP: ?Our staff will call the number listed on your records 48-72 hours following your procedure to check on you and address any questions or concerns that you may have regarding the information given to you following your procedure. If we do not reach you, we will leave a message.  We will attempt to reach you two times.  During this call, we will ask if you have developed any symptoms of COVID 19. If you develop any symptoms (ie: fever, flu-like symptoms, shortness of breath, cough etc.) before then, please call [phone].  If you test positive for Covid 19 in the 2 weeks post procedure, please call and report this information to [place].   ? ?If any biopsies were taken you will be contacted by phone or by letter within the next 1-3 weeks.  Please call us at [phone] if you have not heard about the biopsies in 3 weeks.  ? ? ?SIGNATURES/CONFIDENTIALITY: ?You and/or your care partner have signed paperwork which will be entered into your electronic medical record.  These signatures attest to the fact that that the information above on your After Visit Summary has been reviewed  and is understood.  Full responsibility of the confidentiality of this discharge information lies with you and/or your care-partner.  ?

## 2022-01-28 NOTE — Progress Notes (Signed)
Report to PACU, RN, vss, BBS= Clear.  

## 2022-01-28 NOTE — Progress Notes (Signed)
? ?GASTROENTEROLOGY PROCEDURE H&P NOTE  ? ?Primary Care Physician: ?Derinda Late, MD ? ? ? ?Reason for Procedure:  History of esophageal adenocarcinoma, residual Barrett's esophagus with dysplasia, for surveillance ? ?Plan:    EGD with biopsy ? ?Patient is appropriate for endoscopic procedure(s) in the ambulatory ([place]) setting. ? ?The nature of the procedure, as well as the risks, benefits, and alternatives were carefully and thoroughly reviewed with the patient. Ample time for discussion and questions allowed. The patient understood, was satisfied, and agreed to proceed.  ? ? ? ?HPI: ?Sean Hedglin. is a 76 y.o. male who presents for surveillance EGD with biopsies.  Medical history as below.  Eliquis has been on hold with last dose 01/25/2022.  No recent chest pain or shortness of breath.  No abdominal pain today. ? ?Past Medical History:  ?Diagnosis Date  ? Anxiety   ? Cancer [facility])   ? Head/throat Cancer at the base of the tongue  ? Cataract   ? lt eye repair; present in rt eye but not ripe yet.  ? Claustrophobia   ? Diabetes mellitus without complication ([place])   ? Esophageal adenocarcinoma ([place]) 04/21/2021  ? GERD (gastroesophageal reflux disease)   ? Glaucoma   ? using gtts for this.  ? Hypercholesterolemia   ? Hypertension   ? ? ?Past Surgical History:  ?Procedure Laterality Date  ? CATARACT EXTRACTION W/PHACO Left 12/03/2017  ? Procedure: CATARACT EXTRACTION PHACO AND INTRAOCULAR LENS PLACEMENT ([place]) COMPLICATED DIABETIC LEFT;  Surgeon: Leandrew Koyanagi, MD;  Location: [facility];  Service: Ophthalmology;  Laterality: Left;  Diabetic - oral meds  ? CHOLECYSTECTOMY    ? COLONOSCOPY  2016  ? PORTA CATH INSERTION N/A 11/13/2020  ? Procedure: PORTA CATH INSERTION;  Surgeon: Algernon Huxley, MD;  Location: [facility];  Service: Cardiovascular;  Laterality: N/A;  ? ? ?Prior to Admission medications   ?Medication Sig Start Date End Date Taking? Authorizing Provider  ?brimonidine-timolol  (COMBIGAN) 0.2-0.5 % ophthalmic solution Place 1 drop into the left eye daily. 06/09/19  Yes [provider]  ?calcium carbonate (OS-CAL) 600 MG TABS tablet Take by mouth.   Yes [provider]  ?citalopram (CELEXA) 10 MG tablet TAKE 1 TABLET BY MOUTH EVERY DAY 12/10/21  Yes Ursula Alert, MD  ?magnesium chloride (SLOW-MAG) 64 MG TBEC SR tablet Take 1 tablet (64 mg total) by mouth 2 (two) times daily. 06/27/21  Yes Earlie Server, MD  ?metFORMIN (GLUCOPHAGE-XR) 500 MG 24 hr tablet Take 500 mg by mouth 2 (two) times daily. Takes 2 tablets and 1 tablet in the pm   Yes [provider]  ?pantoprazole (PROTONIX) 40 MG tablet Take by mouth.   Yes [provider]  ?simvastatin (ZOCOR) 40 MG tablet Take 40 mg by mouth daily at 6 PM.   Yes [provider]  ?traZODone (DESYREL) 50 MG tablet Take 1 tablet (50 mg total) by mouth at bedtime as needed for sleep. 10/22/21  Yes Ursula Alert, MD  ?ALPRAZolam Duanne Moron) 0.5 MG tablet Take 1 tablet (0.5 mg total) by mouth 3 (three) times daily as needed for anxiety. ?Patient not taking: Reported on 01/28/2022 07/26/21   Borders, Kirt Boys, NP  ?APIXABAN Arne Cleveland) VTE STARTER PACK ('10MG'$  AND '5MG'$ ) Take as directed on package: start with two-'5mg'$  tablets twice daily for 7 days. On day 8 (01/29/2021), switch to one-'5mg'$  tablet twice daily. 01/22/21   Nolberto Hanlon, MD  ?glucose blood test strip  05/06/17   [provider]  ?vitamin B-12 (CYANOCOBALAMIN) 1000 MCG tablet Take 1,000 mcg by mouth daily.    [provider]  ?VITAMIN D PO Take 800 Units by mouth daily.    [provider]  ?zinc gluconate 50 MG tablet Take 50 mg by mouth daily.    [provider]  ? ? ?Current Outpatient Medications  ?Medication Sig Dispense Refill  ? brimonidine-timolol (COMBIGAN) 0.2-0.5 % ophthalmic solution Place 1 drop into the left eye daily.    ? calcium carbonate (OS-CAL) 600 MG TABS tablet Take by mouth.    ? citalopram (CELEXA) 10 MG tablet  TAKE 1 TABLET BY MOUTH EVERY DAY 90 tablet 1  ? magnesium chloride (SLOW-MAG) 64 MG TBEC SR tablet Take 1 tablet (64 mg total) by mouth 2 (two) times daily. 60 tablet 1  ? metFORMIN (GLUCOPHAGE-XR) 500 MG 24 hr tablet Take 500 mg by mouth 2 (two) times daily. Takes 2 tablets and 1 tablet in the pm    ? pantoprazole (PROTONIX) 40 MG tablet Take by mouth.    ? simvastatin (ZOCOR) 40 MG tablet Take 40 mg by mouth daily at 6 PM.    ? traZODone (DESYREL) 50 MG tablet Take 1 tablet (50 mg total) by mouth at bedtime as needed for sleep. 90 tablet 1  ? ALPRAZolam (XANAX) 0.5 MG tablet Take 1 tablet (0.5 mg total) by mouth 3 (three) times daily as needed for anxiety. (Patient not taking: Reported on 01/28/2022) 30 tablet 0  ? APIXABAN (ELIQUIS) VTE STARTER PACK ('10MG'$  AND '5MG'$ ) Take as directed on package: start with two-'5mg'$  tablets twice daily for 7 days. On day 8 (01/29/2021), switch to one-'5mg'$  tablet twice daily. 1 tablet 0  ? glucose blood test strip     ? vitamin B-12 (CYANOCOBALAMIN) 1000 MCG tablet Take 1,000 mcg by mouth daily.    ? VITAMIN D PO Take 800 Units by mouth daily.    ? zinc gluconate 50 MG tablet Take 50 mg by mouth daily.    ? ?Current Facility-Administered Medications  ?Medication Dose Route Frequency Provider Last Rate Last Admin  ? 0.9 %  sodium chloride infusion  500 mL Intravenous Once Linde Wilensky, Lajuan Lines, MD      ? ?Facility-Administered Medications Ordered in Other Visits  ?Medication Dose Route Frequency Provider Last Rate Last Admin  ? 0.9 % NaCl with KCl 20 mEq/ L  infusion   Intravenous Once Mike Gip, Melissa C, MD      ? heparin lock flush 100 UNIT/ML injection           ? heparin lock flush 100 unit/mL  500 Units Intravenous Once Earlie Server, MD      ? magnesium sulfate IVPB 2 g 50 mL  2 g Intravenous Once Nolon Stalls C, MD      ? sodium chloride flush (NS) 0.9 % injection 10 mL  10 mL Intravenous Once Earlie Server, MD      ? ? ?Allergies as of 01/28/2022  ? (No Known Allergies)  ? ? ?Family History   ?Problem Relation Age of Onset  ? Cancer Mother   ? Colon cancer Neg Hx   ? Colon polyps Neg Hx   ? Esophageal cancer Neg Hx   ? Rectal cancer Neg Hx   ? Stomach cancer Neg Hx   ? ? ?Social History  ? ?Socioeconomic History  ? Marital status: Married  ?  Spouse name: Not on file  ? Number of children: Not on file  ?  Years of education: Not on file  ? Highest education level: Not on file  ?Occupational History  ? Not on file  ?Tobacco Use  ? Smoking status: Former  ?  Types: Cigarettes  ?  Quit date: 59  ?  Years since quitting: 37.2  ? Smokeless tobacco: Never  ?Vaping Use  ? Vaping Use: Never used  ?Substance and Sexual Activity  ? Alcohol use: Not Currently  ?  Comment: occasional - 1 glass wine/month  ? Drug use: Never  ? Sexual activity: Not on file  ?Other Topics Concern  ? Not on file  ?Social History Narrative  ? Not on file  ? ?Social Determinants of Health  ? ?Financial Resource Strain: Not on file  ?Food Insecurity: Not on file  ?Transportation Needs: Not on file  ?Physical Activity: Not on file  ?Stress: Not on file  ?Social Connections: Not on file  ?Intimate Partner Violence: Not on file  ? ? ?Physical Exam: ?Vital signs in last 24 hours: ?'@BP'$  (!) 180/72   Pulse (!) 55   Temp 98 ?F (36.7 ?C)   Resp 17   Ht '5\' 10"'$  (1.778 m)   Wt 173 lb (78.5 kg)   SpO2 99%   BMI 24.82 kg/m?  ?GEN: NAD ?EYE: Sclerae anicteric ?ENT: MMM ?CV: Non-tachycardic ?Pulm: CTA b/l ?GI: Soft, NT/ND ?NEURO:  Alert & Oriented x 3 ? ? ?Zenovia Jarred, MD ?[facility] ? ?01/28/2022 1:42 PM ? ?

## 2022-01-28 NOTE — Progress Notes (Signed)
Called to room to assist during endoscopic procedure.  Patient ID and intended procedure confirmed with present staff. Received instructions for my participation in the procedure from the performing physician.  

## 2022-01-28 NOTE — Op Note (Signed)
[facility] ?Patient Name: Sean Garner ?Procedure Date: 01/28/2022 1:36 PM ?MRN: [id number] ?Endoscopist: Jerene Bears , MD ?Age: 76 ?Referring MD:  ?Date of Birth: [birth year] ?Gender: Male ?Account #: [id number] ?Procedure:                Upper GI endoscopy ?Indications:              Follow-up of malignant esophageal adenocarcinoma  ?                          (Stage 1B) dx Dec 2021 treated with chemotherapy  ?                          with excellent response; last EGD Dec 2022 with  ?                          Barrett's and low-grade (possible focal high-grade)  ?                          dysplasia; for surveillance ?Medicines:                Monitored Anesthesia Care ?Procedure:                Pre-Anesthesia Assessment: ?                          - Prior to the procedure, a History and Physical  ?                          was performed, and patient medications and  ?                          allergies were reviewed. The patient's tolerance of  ?                          previous anesthesia was also reviewed. The risks  ?                          and benefits of the procedure and the sedation  ?                          options and risks were discussed with the patient.  ?                          All questions were answered, and informed consent  ?                          was obtained. Prior Anticoagulants: The patient has  ?                          taken Eliquis (apixaban), last dose was 2 days  ?                          prior to procedure. ASA Grade Assessment: II - A  ?  patient with mild systemic disease. After reviewing  ?                          the risks and benefits, the patient was deemed in  ?                          satisfactory condition to undergo the procedure. ?                          After obtaining informed consent, the endoscope was  ?                          passed under direct vision. Throughout the  ?                          procedure, the patient's  blood pressure, pulse, and  ?                          oxygen saturations were monitored continuously. The  ?                          GIF [id number] #[id number] was introduced through the  ?                          mouth, and advanced to the second part of duodenum.  ?                          The upper GI endoscopy was accomplished without  ?                          difficulty. The patient tolerated the procedure  ?                          well. ?Scope In: ?Scope Out: ?Findings:                 The esophagus and gastroesophageal junction were  ?                          examined with white light and narrow band imaging  ?                          (NBI) from a forward view and retroflexed position.  ?                          There were esophageal mucosal changes secondary to  ?                          established Barrett's disease. These changes  ?                          involved the mucosa at the upper extent of the  ?  gastric folds (39 cm from the incisors) extending  ?                          to the Z-line (34 cm from the incisors).  ?                          Circumferential salmon-colored mucosa was present  ?                          from 37 to 39 cm and scattered islands of  ?                          salmon-colored mucosa were present from 34 to 35  ?                          cm. The maximum longitudinal extent of these  ?                          esophageal mucosal changes was 5 cm in length.  ?                          Mucosa was biopsied with a cold forceps for  ?                          histology in a targeted manner and in 4 quadrants  ?                          at intervals of 1 cm. A total of 5 specimen bottles  ?                          were sent to pathology (esophagus at 39 cm, 38 cm,  ?                          37 cm, 35 cm and 34 cm). ?                          A 2 cm hiatal hernia was present. ?                          The entire examined stomach was normal. ?                           The examined duodenum was normal. ?Complications:            No immediate complications. ?Estimated Blood Loss:     Estimated blood loss was minimal. ?Impression:               - Esophageal mucosal changes secondary to  ?                          established short-segment Barrett's disease.  ?                          Biopsied. ?                          -  2 cm hiatal hernia. ?                          - Normal stomach. ?                          - Normal examined duodenum. ?Recommendation:           - Patient has a contact number available for  ?                          emergencies. The signs and symptoms of potential  ?                          delayed complications were discussed with the  ?                          patient. Return to normal activities tomorrow.  ?                          Written discharge instructions were provided to the  ?                          patient. ?                          - Resume previous diet. ?                          - Continue present medications. ?                          - Resume Eliquis tomorrow at previous dose per  ?                          prescribing provider. ?                          - Await pathology results. ?                          - Repeat upper endoscopy in 3 months for  ?                          surveillance. Possible RFA depending on results. ?Jerene Bears, MD ?01/28/2022 2:00:16 PM ?This report has been signed electronically. ?

## 2022-01-29 ENCOUNTER — Encounter: Payer: Self-pay | Admitting: Internal Medicine

## 2022-01-30 ENCOUNTER — Encounter: Payer: Self-pay | Admitting: Psychiatry

## 2022-01-30 ENCOUNTER — Other Ambulatory Visit: Payer: Self-pay

## 2022-01-30 ENCOUNTER — Ambulatory Visit (INDEPENDENT_AMBULATORY_CARE_PROVIDER_SITE_OTHER): Payer: Medicare Other | Admitting: Psychiatry

## 2022-01-30 ENCOUNTER — Telehealth: Payer: Self-pay | Admitting: *Deleted

## 2022-01-30 VITALS — BP 156/67 | HR 63 | Temp 98.3°F | Wt 174.4 lb

## 2022-01-30 DIAGNOSIS — F5101 Primary insomnia: Secondary | ICD-10-CM | POA: Diagnosis not present

## 2022-01-30 DIAGNOSIS — F411 Generalized anxiety disorder: Secondary | ICD-10-CM | POA: Diagnosis not present

## 2022-01-30 DIAGNOSIS — F4312 Post-traumatic stress disorder, chronic: Secondary | ICD-10-CM | POA: Diagnosis not present

## 2022-01-30 NOTE — Telephone Encounter (Signed)
?  Follow up Call- ? ?Call back number 01/28/2022 10/29/2021 11/03/2020  ?Post procedure Call Back phone  # [phone]  ?Permission to leave phone message Yes Yes Yes  ?Some recent data might be hidden  ?  ? ?Patient questions: ? ?Do you have a fever, pain , or abdominal swelling? No. ?Pain Score  0 * ? ?Have you tolerated food without any problems? Yes.   ? ?Have you been able to return to your normal activities? Yes.   ? ?Do you have any questions about your discharge instructions: ?Diet   No. ?Medications  No. ?Follow up visit  No. ? ?Do you have questions or concerns about your Care? No. ? ?Actions: ?* If pain score is 4 or above: ?No action needed, pain <4. ? ? ?

## 2022-01-30 NOTE — Progress Notes (Signed)
BH MD OP Progress Note  01/30/2022 2:52 PM Yoshua Arta Bruce.  MRN:  [id number]  Chief Complaint:  Chief Complaint  Patient presents with   Follow-up : 76 year old male with history of generalized anxiety disorder, PTSD, primary insomnia, presented for medication management.   HPI: Ziggy Dicapua. 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  Patient today reports he is currently doing well with regards to his mood.  He tries to stay busy.  Has taken up several hobbies, projects around the house.  Reports sleep as good , takes the trazodone which helps.  Denies side effects.  Patient reports appetite as fair.  He does have loss of sense of taste however he has been eating enough for himself.  Denies suicidality, homicidality or perceptual disturbances.  Patient with history of tongue carcinoma, adenocarcinoma of GE junction, with good response to treatment, Barrett's and low-grade possible focal dysplasia currently undergoing surveillance.  Continues to follow-up with his providers.  Patient denies any other concerns today.  Visit Diagnosis:    ICD-10-CM   1. GAD (generalized anxiety disorder)  F41.1     2. Chronic post-traumatic stress disorder (PTSD)  F43.12     3. Primary insomnia  F51.01       Past Psychiatric History: Reviewed past psychiatric history from progress note on 01/01/2021.  Past trials of medications like Celexa, trazodone, mirtazapine-nightmares.  Past Medical History:  Past Medical History:  Diagnosis Date   Anxiety    Cancer (HCC)    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   Claustrophobia    Diabetes mellitus without complication (HCC)    Esophageal adenocarcinoma (HCC) 04/21/2021   GERD  (gastroesophageal reflux disease)    Glaucoma    using gtts for this.   Hypercholesterolemia    Hypertension     Past Surgical History:  Procedure Laterality Date   CATARACT EXTRACTION W/PHACO Left 12/03/2017   Procedure: CATARACT EXTRACTION PHACO AND INTRAOCULAR LENS PLACEMENT ([facility]) COMPLICATED DIABETIC LEFT;  Surgeon: Lockie Mola, MD;  Location: [facility];  Service: Ophthalmology;  Laterality: Left;  Diabetic - oral meds   CHOLECYSTECTOMY     COLONOSCOPY  2016   PORTA CATH INSERTION N/A 11/13/2020   Procedure: PORTA CATH INSERTION;  Surgeon: Annice Needy, MD;  Location: [facility];  Service: Cardiovascular;  Laterality: N/A;    Family Psychiatric History: Reviewed family psychiatric history from progress note on 01/01/2021.  Family History:  Family History  Problem Relation Age of Onset   Cancer Mother    Colon cancer Neg Hx    Colon polyps Neg Hx    Esophageal cancer Neg Hx    Rectal cancer Neg Hx    Stomach cancer Neg Hx     Social History: Reviewed social history from progress note on 01/01/2021. Social History   Socioeconomic History   Marital status: Married    Spouse name: Not on file   Number of children: Not on file   Years of education: Not on file   Highest education level: Not on file  Occupational History   Not on file  Tobacco Use   Smoking status: Former    Types: Cigarettes    Quit date: 24  Years since quitting: 37.2   Smokeless tobacco: Never  Vaping Use   Vaping Use: Never used  Substance and Sexual Activity   Alcohol use: Not Currently    Comment: occasional - 1 glass wine/month   Drug use: Never   Sexual activity: Not on file  Other Topics Concern   Not on file  Social History Narrative   Not on file   Social Determinants of Health   Financial Resource Strain: Not on file  Food Insecurity: Not on file  Transportation Needs: Not on file  Physical Activity: Not on file  Stress: Not on file  Social  Connections: Not on file    Allergies: No Known Allergies  Metabolic Disorder Labs: Lab Results  Component Value Date   HGBA1C 8.1 (H) 01/20/2021   MPG 185.77 01/20/2021   No results found for: PROLACTIN No results found for: CHOL, TRIG, HDL, CHOLHDL, VLDL, LDLCALC Lab Results  Component Value Date   TSH 0.577 01/08/2021   TSH 2.077 11/27/2020    Therapeutic Level Labs: No results found for: LITHIUM No results found for: VALPROATE No components found for:  CBMZ  Current Medications: Current Outpatient Medications  Medication Sig Dispense Refill   APIXABAN (ELIQUIS) VTE STARTER PACK (10MG  AND 5MG ) Take as directed on package: start with two-5mg  tablets twice daily for 7 days. On day 8 (01/29/2021), switch to one-5mg  tablet twice daily. 1 tablet 0   brimonidine-timolol (COMBIGAN) 0.2-0.5 % ophthalmic solution Place 1 drop into the left eye daily.     calcium carbonate (OS-CAL) 600 MG TABS tablet Take by mouth.     citalopram (CELEXA) 10 MG tablet TAKE 1 TABLET BY MOUTH EVERY DAY 90 tablet 1   glucose blood test strip      magnesium chloride (SLOW-MAG) 64 MG TBEC SR tablet Take 1 tablet (64 mg total) by mouth 2 (two) times daily. 60 tablet 1   metFORMIN (GLUCOPHAGE-XR) 500 MG 24 hr tablet Take 500 mg by mouth 2 (two) times daily. Takes 2 tablets and 1 tablet in the pm     pantoprazole (PROTONIX) 40 MG tablet Take 40 mg by mouth daily.     simvastatin (ZOCOR) 40 MG tablet Take 40 mg by mouth daily at 6 PM.     traZODone (DESYREL) 50 MG tablet Take 1 tablet (50 mg total) by mouth at bedtime as needed for sleep. 90 tablet 1   vitamin B-12 (CYANOCOBALAMIN) 1000 MCG tablet Take 1,000 mcg by mouth daily.     VITAMIN D PO Take 800 Units by mouth daily.     zinc gluconate 50 MG tablet Take 50 mg by mouth daily.     ALPRAZolam (XANAX) 0.5 MG tablet Take 1 tablet (0.5 mg total) by mouth 3 (three) times daily as needed for anxiety. (Patient not taking: Reported on 01/30/2022) 30 tablet 0    Current Facility-Administered Medications  Medication Dose Route Frequency Provider Last Rate Last Admin   0.9 %  sodium chloride infusion  500 mL Intravenous Once Pyrtle, Carie Caddy, MD       Facility-Administered Medications Ordered in Other Visits  Medication Dose Route Frequency Provider Last Rate Last Admin   0.9 % NaCl with KCl 20 mEq/ L  infusion   Intravenous Once Corcoran, Melissa C, MD       heparin lock flush 100 UNIT/ML injection            heparin lock flush 100 unit/mL  500 Units Intravenous Once Rickard Patience, MD  magnesium sulfate IVPB 2 g 50 mL  2 g Intravenous Once Corcoran, Melissa C, MD       sodium chloride flush (NS) 0.9 % injection 10 mL  10 mL Intravenous Once Rickard Patience, MD         Musculoskeletal: Strength & Muscle Tone: within normal limits Gait & Station: normal Patient leans: N/A  Psychiatric Specialty Exam: Review of Systems  Psychiatric/Behavioral:  Negative for agitation, behavioral problems, confusion, decreased concentration, dysphoric mood, hallucinations, self-injury, sleep disturbance and suicidal ideas. The patient is not nervous/anxious and is not hyperactive.   All other systems reviewed and are negative.  Blood pressure (!) 156/67, pulse 63, temperature 98.3 F (36.8 C), temperature source Temporal, weight 174 lb 6.4 oz (79.1 kg).Body mass index is 25.02 kg/m.  General Appearance: Casual  Eye Contact:  Fair  Speech:  Clear and Coherent  Volume:  Normal  Mood:  Euthymic  Affect:  Congruent  Thought Process:  Goal Directed and Descriptions of Associations: Intact  Orientation:  Full (Time, Place, and Person)  Thought Content: Logical   Suicidal Thoughts:  No  Homicidal Thoughts:  No  Memory:  Immediate;   Fair Recent;   Fair Remote;   Fair  Judgement:  Fair  Insight:  Fair  Psychomotor Activity:  Normal  Concentration:  Concentration: Fair and Attention Span: Fair  Recall:  [employer] of Knowledge: Fair  Language: Fair  Akathisia:  No   Handed:  Right  AIMS (if indicated): done, 0  Assets:  Communication Skills Desire for Improvement Housing Intimacy Social Support Talents/Skills Transportation Vocational/Educational  ADL's:  Intact  Cognition: WNL  Sleep:  Fair   Screenings: GAD-7    Flowsheet Row Office Visit from 01/30/2022 in [facility] Video Visit from 01/01/2021 in [facility]  Total GAD-7 Score 2 12      PHQ2-9    Flowsheet Row Office Visit from 01/30/2022 in [facility] Office Visit from 10/22/2021 in [facility] Office Visit from 08/06/2021 in [facility] Video Visit from 02/09/2021 in [facility] Counselor from 01/09/2021 in [facility] [place]  PHQ-2 Total Score 0 0 0 0 3  PHQ-9 Total Score -- -- 3 -- 9      Flowsheet Row ED from 03/14/2021 in [facility] ED to Hosp-Admission (Discharged) from 01/19/2021 in [facility] Counselor from 01/09/2021 in BEHAVIORAL HEALTH OUTPATIENT THERAPY   C-SSRS RISK CATEGORY No Risk No Risk No Risk        Assessment and Plan: [place]. Is a 76 year old Caucasian male, married, has a history of PTSD, stage I right base of tongue carcinoma, stage Ib adenocarcinoma of GE junction, hypertension, hyperlipidemia, GERD, diabetes was evaluated in office today.  Patient is currently stable.  Plan as noted below.  Plan GAD-stable Celexa 10 mg p.o. daily  PTSD-stable We will monitor closely  Primary insomnia-stable Trazodone 50 mg p.o. nightly as needed.  Patient with elevated blood pressure reading in session today advised to follow up with primary care provider.  Follow-up in clinic in 4 to 6 months or sooner if needed.   Consent: Patient/Guardian gives verbal consent for treatment and  assignment of benefits for services provided during this visit. Patient/Guardian expressed understanding and agreed to proceed.   This note was generated in part or whole with voice recognition software. Voice recognition is usually quite accurate but  there are transcription errors that can and very often do occur. I apologize for any typographical errors that were not detected and corrected.      Jomarie Longs, MD 01/31/2022, 8:28 AM

## 2022-02-06 ENCOUNTER — Inpatient Hospital Stay: Payer: Medicare Other | Attending: Oncology

## 2022-02-06 ENCOUNTER — Other Ambulatory Visit: Payer: Self-pay

## 2022-02-06 DIAGNOSIS — C01 Malignant neoplasm of base of tongue: Secondary | ICD-10-CM | POA: Diagnosis present

## 2022-02-06 DIAGNOSIS — Z452 Encounter for adjustment and management of vascular access device: Secondary | ICD-10-CM | POA: Diagnosis not present

## 2022-02-06 DIAGNOSIS — K22719 Barrett's esophagus with dysplasia, unspecified: Secondary | ICD-10-CM

## 2022-02-06 DIAGNOSIS — Z95828 Presence of other vascular implants and grafts: Secondary | ICD-10-CM

## 2022-02-06 MED ORDER — SODIUM CHLORIDE 0.9% FLUSH
10.0000 mL | Freq: Once | INTRAVENOUS | Status: AC
  Administered 2022-02-06: 10 mL via INTRAVENOUS
  Filled 2022-02-06: qty 10

## 2022-02-06 MED ORDER — HEPARIN SOD (PORK) LOCK FLUSH 100 UNIT/ML IV SOLN
500.0000 [IU] | Freq: Once | INTRAVENOUS | Status: AC
  Administered 2022-02-06: 500 [IU] via INTRAVENOUS
  Filled 2022-02-06: qty 5

## 2022-02-06 NOTE — Progress Notes (Signed)
Mansouraty, Telford Nab., MD ?to Hilarie Fredrickson Lajuan Lines, MD  Earlie Server, MD   ?   6:09 AM ?JMP and ZY,  ?Thanks for reaching back out.  ?Happy to be available, will be middle of May or beginning of June for my availability unfortunately.  If the patient wants to remain in this area I am happy to be available.  We would need to talk with him in clinic to go over what the endoscopy surveillance/RFA treatment algorithm is.  So let me know what he decides so that Chong Sicilian can then work on scheduling a clinic visit and then the procedure.  ?If we want things to move more quickly than evaluation with the esophageal GI group at [name] ([facility]) would be able to get them in their protocol and follow-up.  ?Thanks.  ?GM  ?

## 2022-03-07 ENCOUNTER — Other Ambulatory Visit: Payer: Self-pay | Admitting: *Deleted

## 2022-03-07 DIAGNOSIS — C159 Malignant neoplasm of esophagus, unspecified: Secondary | ICD-10-CM

## 2022-03-12 ENCOUNTER — Telehealth: Payer: Self-pay

## 2022-03-12 ENCOUNTER — Encounter: Payer: Self-pay | Admitting: Oncology

## 2022-03-12 ENCOUNTER — Ambulatory Visit (INDEPENDENT_AMBULATORY_CARE_PROVIDER_SITE_OTHER): Payer: Medicare Other | Admitting: Gastroenterology

## 2022-03-12 ENCOUNTER — Encounter: Payer: Self-pay | Admitting: Hematology and Oncology

## 2022-03-12 ENCOUNTER — Encounter: Payer: Self-pay | Admitting: Gastroenterology

## 2022-03-12 VITALS — BP 150/70 | HR 53 | Ht 70.0 in | Wt 176.0 lb

## 2022-03-12 DIAGNOSIS — K22711 Barrett's esophagus with high grade dysplasia: Secondary | ICD-10-CM | POA: Diagnosis not present

## 2022-03-12 DIAGNOSIS — R933 Abnormal findings on diagnostic imaging of other parts of digestive tract: Secondary | ICD-10-CM

## 2022-03-12 DIAGNOSIS — Z7901 Long term (current) use of anticoagulants: Secondary | ICD-10-CM

## 2022-03-12 DIAGNOSIS — Z8501 Personal history of malignant neoplasm of esophagus: Secondary | ICD-10-CM | POA: Diagnosis not present

## 2022-03-12 NOTE — Patient Instructions (Signed)
You have been scheduled for an endoscopy. Please follow written instructions given to you at your visit today. ?If you use inhalers (even only as needed), please bring them with you on the day of your procedure. ? ?You will be contaced by our office prior to your procedure for directions on holding your Eliquis  If you do not hear from our office 1 week prior to your scheduled procedure, please call [phone] to discuss. ? ?Thank you for choosing me and [facility]. ? ?Dr. Rush Landmark ? ?

## 2022-03-12 NOTE — Telephone Encounter (Signed)
Patty, we seen Sean Garner in the office today. It looks like you sent a clearance to Dr Tasia Catchings on 02/06/22 asking for clearance to hold Eliquis prior to his procedure.Did you ever receive clearance back for his office?  ?

## 2022-03-13 NOTE — Telephone Encounter (Signed)
I did see below the message from Dr Tasia Catchings. I also notified the pt and his wife on 3/24 over the phone.  I had to dig deep to find the response.   ? ? ? ?Earlie Server, MD ?to Hilarie Fredrickson Lajuan Lines, MD  Mansouraty, Telford Nab., MD   ?ZY ?  11:37 PM ?Thanks you for the update.  ?He is on low dose Eliquis for prophylaxis. Ok to hold 2 days prior to RFA.  ? ?Zhou  ?

## 2022-03-16 ENCOUNTER — Encounter: Payer: Self-pay | Admitting: Gastroenterology

## 2022-03-16 DIAGNOSIS — Z8501 Personal history of malignant neoplasm of esophagus: Secondary | ICD-10-CM | POA: Insufficient documentation

## 2022-03-16 DIAGNOSIS — K22711 Barrett's esophagus with high grade dysplasia: Secondary | ICD-10-CM | POA: Insufficient documentation

## 2022-03-16 DIAGNOSIS — Z7901 Long term (current) use of anticoagulants: Secondary | ICD-10-CM | POA: Insufficient documentation

## 2022-03-16 DIAGNOSIS — R933 Abnormal findings on diagnostic imaging of other parts of digestive tract: Secondary | ICD-10-CM | POA: Insufficient documentation

## 2022-03-16 NOTE — Progress Notes (Signed)
? ?GASTROENTEROLOGY OUTPATIENT CLINIC VISIT  ? ?Primary Care Provider ?Derinda Late, MD ?[address] [facility] ?[place] [address] ?[phone] ? ?Referring Provider ?Dr. Hilarie Fredrickson, Dr. Tasia Catchings ? ?Patient Profile: ?Sean Garner. is a 76 y.o. 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  The patient presents to the [facility] for an evaluation and management of problem(s) noted below: ? ?Problem List ?1. History of esophageal cancer   ?2. Barrett's esophagus with high grade dysplasia   ?3. Abnormal endoscopy of upper gastrointestinal tract   ?4. Chronic anticoagulation   ? ? ?History of Present Illness ?Please see prior GI notes for full details of HPI. ? ?Interval History ?This is a patient that has been followed by Dr. 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  He ended up seeing oncology as well as thoracic surgery and after discussion underwent chemoradiation for his treatment without surgical resection.  He subsequently has had endoscopic surveillance in December 2022.  This showed no evidence of residual tumor but biopsies of the short segment Barrett's esophagus showed both low-grade dysplasia and focally suspicious high-grade dysplasia.  On the March endoscopy he had evidence of long segment Barrett's esophagus and at 37 cm had evidence of both low-grade and high-grade dysplasia.  His case was discussed at [facility] multidisciplinary conference and decision was made that surgical options could be offered but radiofrequency ablation would be less invasive.  The patient discussed this with his gastroenterologist as well as his oncologist and he has been referred to me now to consider ablative  techniques.  The patient is accompanied by his wife today.  He has been doing well overall.  He denies any significant issues of dysphagia.  He is willing and ready to begin treatments. ? ?GI Review of Systems ?Positive as above ?Negative for odynophagia, nausea, vomiting, pain, alteration of bowel habits, melena, hematochezia ? ?Review of Systems ?General: Denies fevers/chills/weight loss unintentionally ?HEENT: Denies oral lesions ?Cardiovascular: Denies chest pain ?Pulmonary: Denies shortness of breath ?Gastroenterological: See HPI ?Genitourinary: Denies darkened urine ?Hematological: Positive for history of easy bruising/bleeding due to anticoagulation ?Dermatological: Denies jaundice ?Psychological: Mood is stable ? ? ?Medications ?Current Outpatient Medications  ?Medication Sig Dispense Refill  ? APIXABAN (ELIQUIS) VTE STARTER PACK ('10MG'$  AND '5MG'$ ) Take as directed on package: start with two-'5mg'$  tablets twice daily for 7 days. On day 8 (01/29/2021), switch to one-'5mg'$  tablet twice daily. (Patient taking differently: Take 2.5 mg by mouth 2 (two) times daily. Take as directed on package: start with two-'5mg'$  tablets twice daily for 7 days. On day 8 (01/29/2021), switch to one-'5mg'$  tablet twice daily.) 1 tablet 0  ? brimonidine-timolol (COMBIGAN) 0.2-0.5 % ophthalmic solution Place 1 drop into the left eye daily.    ? calcium carbonate (OS-CAL) 600 MG TABS tablet Take by mouth.    ? citalopram (CELEXA) 10 MG tablet TAKE 1 TABLET BY MOUTH EVERY DAY 90 tablet 1  ? glucose blood test strip     ? losartan (COZAAR) 25 MG tablet Take 25 mg by mouth daily.    ? magnesium chloride (SLOW-MAG) 64 MG TBEC SR tablet Take 1 tablet (64 mg total) by mouth 2 (two) times daily. 60 tablet 1  ? metFORMIN (GLUCOPHAGE-XR) 500 MG 24 hr tablet Take 500 mg  by mouth 2 (two) times daily. Takes 2 tablets and 1 tablet in the pm    ? pantoprazole (PROTONIX) 40 MG tablet Take 40 mg by mouth daily.    ? simvastatin (ZOCOR) 40 MG tablet Take 40 mg by  mouth daily at 6 PM.    ? traZODone (DESYREL) 50 MG tablet Take 1 tablet (50 mg total) by mouth at bedtime as needed for sleep. 90 tablet 1  ? vitamin B-12 (CYANOCOBALAMIN) 1000 MCG tablet Take 1,000 mcg by mouth daily.    ? VITAMIN D PO Take 800 Units by mouth daily.    ? zinc gluconate 50 MG tablet Take 50 mg by mouth daily.    ? ?Current Facility-Administered Medications  ?Medication Dose Route Frequency Provider Last Rate Last Admin  ? 0.9 %  sodium chloride infusion  500 mL Intravenous Once Pyrtle, Lajuan Lines, MD      ? ?Facility-Administered Medications Ordered in Other Visits  ?Medication Dose Route Frequency Provider Last Rate Last Admin  ? 0.9 % NaCl with KCl 20 mEq/ L  infusion   Intravenous Once Mike Gip, Melissa C, MD      ? heparin lock flush 100 UNIT/ML injection           ? heparin lock flush 100 unit/mL  500 Units Intravenous Once Earlie Server, MD      ? magnesium sulfate IVPB 2 g 50 mL  2 g Intravenous Once Nolon Stalls C, MD      ? sodium chloride flush (NS) 0.9 % injection 10 mL  10 mL Intravenous Once Earlie Server, MD      ? ? ?Allergies ?No Known Allergies ? ?Histories ?Past Medical History:  ?Diagnosis Date  ? Anxiety   ? Cancer [facility])   ? Head/throat Cancer at the base of the tongue  ? Cataract   ? lt eye repair; present in rt eye but not ripe yet.  ? Claustrophobia   ? Diabetes mellitus without complication ([place])   ? Esophageal adenocarcinoma ([place]) 04/21/2021  ? GERD (gastroesophageal reflux disease)   ? Glaucoma   ? using gtts for this.  ? Hypercholesterolemia   ? Hypertension   ? ?Past Surgical History:  ?Procedure Laterality Date  ? CATARACT EXTRACTION W/PHACO Left 12/03/2017  ? Procedure: CATARACT EXTRACTION PHACO AND INTRAOCULAR LENS PLACEMENT ([place]) COMPLICATED DIABETIC LEFT;  Surgeon: Leandrew Koyanagi, MD;  Location: [place];  Service: Ophthalmology;  Laterality: Left;  Diabetic - oral meds  ? CHOLECYSTECTOMY    ? COLONOSCOPY  2016  ? PORTA CATH INSERTION N/A 11/13/2020  ?  Procedure: PORTA CATH INSERTION;  Surgeon: Algernon Huxley, MD;  Location: [place] [facility];  Service: Cardiovascular;  Laterality: N/A;  ? ?Social History  ? ?Socioeconomic History  ? Marital status: Married  ?  Spouse name: Not on file  ? Number of children: Not on file  ? Years of education: Not on file  ? Highest education level: Not on file  ?Occupational History  ? Not on file  ?Tobacco Use  ? Smoking status: Former  ?  Types: Cigarettes  ?  Quit date: 74  ?  Years since quitting: 37.3  ? Smokeless tobacco: Never  ?Vaping Use  ? Vaping Use: Never used  ?Substance and Sexual Activity  ? Alcohol use: Not Currently  ?  Comment: occasional - 1 glass wine/month  ? Drug use: Never  ? Sexual activity: Not on file  ?Other Topics Concern  ? Not on file  ?  Social History Narrative  ? Not on file  ? ?Social Determinants of Health  ? ?Financial Resource Strain: Not on file  ?Food Insecurity: Not on file  ?Transportation Needs: Not on file  ?Physical Activity: Not on file  ?Stress: Not on file  ?Social Connections: Not on file  ?Intimate Partner Violence: Not on file  ? ?Family History  ?Problem Relation Age of Onset  ? Cancer Mother   ? Colon cancer Neg Hx   ? Colon polyps Neg Hx   ? Esophageal cancer Neg Hx   ? Rectal cancer Neg Hx   ? Stomach cancer Neg Hx   ? Inflammatory bowel disease Neg Hx   ? Liver disease Neg Hx   ? Pancreatic cancer Neg Hx   ? ?I have reviewed his medical, social, and family history in detail and updated the electronic medical record as necessary.  ? ? ?PHYSICAL EXAMINATION  ?BP (!) 150/70   Pulse (!) 53   Ht '5\' 10"'$  (1.778 m)   Wt 176 lb (79.8 kg)   BMI 25.25 kg/m?  ?Wt Readings from Last 3 Encounters:  ?03/12/22 176 lb (79.8 kg)  ?01/28/22 173 lb (78.5 kg)  ?01/02/22 172 lb 14.4 oz (78.4 kg)  ?GEN: NAD, appears stated age, doesn't appear chronically ill, accompanied by wife ?PSYCH: Cooperative, without pressured speech ?EYE: Conjunctivae pink, sclerae anicteric ?ENT: MMM ?CV:  Nontachycardic ?RESP: No audible wheezing ?GI: NABS, soft, NT/ND, without rebound ?MSK/EXT: No significant lower extremity edema ?SKIN: No jaundice ?NEURO:  Alert & Oriented x 3, no focal deficits ? ? ?REVIEW OF DATA  ?I reviewe

## 2022-03-16 NOTE — H&P (View-Only) (Signed)
[facility] VISIT   Primary Care Provider Derinda Late, MD [phone] [facility] [place] [address] [phone]  Referring Provider Dr. Hilarie Fredrickson, Dr. Tasia Catchings  Patient Profile: Sean Lucy Tibor Lemmons. is a 76 y.o. 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  The patient presents to the [facility] for an evaluation and management of problem(s) noted below:  Problem List 1. History of esophageal cancer   2. Barrett's esophagus with high grade dysplasia   3. Abnormal endoscopy of upper gastrointestinal tract   4. Chronic anticoagulation     History of Present Illness Please see prior GI notes for full details of HPI.  Interval History This is a patient that has been followed by Dr. 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  He ended up seeing oncology as well as thoracic surgery and after discussion underwent chemoradiation for his treatment without surgical resection.  He subsequently has had endoscopic surveillance in December 2022.  This showed no evidence of residual tumor but biopsies of the short segment Barrett's esophagus showed both low-grade dysplasia and focally suspicious high-grade dysplasia.  On the March endoscopy he had evidence of long segment Barrett's esophagus and at 37 cm had evidence of both low-grade and high-grade dysplasia.  His case was discussed at [facility] multidisciplinary conference and decision was made that surgical options could be offered but radiofrequency ablation would be less invasive.  The patient discussed this with his gastroenterologist as well as his oncologist and he has been referred to me now to consider ablative  techniques.  The patient is accompanied by his wife today.  He has been doing well overall.  He denies any significant issues of dysphagia.  He is willing and ready to begin treatments.  GI Review of Systems Positive as above Negative for odynophagia, nausea, vomiting, pain, alteration of bowel habits, melena, hematochezia  Review of Systems General: Denies fevers/chills/weight loss unintentionally HEENT: Denies oral lesions Cardiovascular: Denies chest pain Pulmonary: Denies shortness of breath Gastroenterological: See HPI Genitourinary: Denies darkened urine Hematological: Positive for history of easy bruising/bleeding due to anticoagulation Dermatological: Denies jaundice Psychological: Mood is stable   Medications Current Outpatient Medications  Medication Sig Dispense Refill   APIXABAN (ELIQUIS) VTE STARTER PACK ('10MG'$  AND '5MG'$ ) Take as directed on package: start with two-'5mg'$  tablets twice daily for 7 days. On day 8 (01/29/2021), switch to one-'5mg'$  tablet twice daily. (Patient taking differently: Take 2.5 mg by mouth 2 (two) times daily. Take as directed on package: start with two-'5mg'$  tablets twice daily for 7 days. On day 8 (01/29/2021), switch to one-'5mg'$  tablet twice daily.) 1 tablet 0   brimonidine-timolol (COMBIGAN) 0.2-0.5 % ophthalmic solution Place 1 drop into the left eye daily.     calcium carbonate (OS-CAL) 600 MG TABS tablet Take by mouth.     citalopram (CELEXA) 10 MG tablet TAKE 1 TABLET BY MOUTH EVERY DAY 90 tablet 1   glucose blood test strip      losartan (COZAAR) 25 MG tablet Take 25 mg by mouth daily.     magnesium chloride (SLOW-MAG) 64 MG TBEC SR tablet Take 1 tablet (64 mg total) by mouth 2 (two) times daily. 60 tablet 1   metFORMIN (GLUCOPHAGE-XR) 500 MG 24 hr tablet Take 500 mg  by mouth 2 (two) times daily. Takes 2 tablets and 1 tablet in the pm     pantoprazole (PROTONIX) 40 MG tablet Take 40 mg by mouth daily.     simvastatin (ZOCOR) 40 MG tablet Take 40 mg by  mouth daily at 6 PM.     traZODone (DESYREL) 50 MG tablet Take 1 tablet (50 mg total) by mouth at bedtime as needed for sleep. 90 tablet 1   vitamin B-12 (CYANOCOBALAMIN) 1000 MCG tablet Take 1,000 mcg by mouth daily.     VITAMIN D PO Take 800 Units by mouth daily.     zinc gluconate 50 MG tablet Take 50 mg by mouth daily.     Current Facility-Administered Medications  Medication Dose Route Frequency Provider Last Rate Last Admin   0.9 %  sodium chloride infusion  500 mL Intravenous Once Pyrtle, Lajuan Lines, MD       Facility-Administered Medications Ordered in Other Visits  Medication Dose Route Frequency Provider Last Rate Last Admin   0.9 % NaCl with KCl 20 mEq/ L  infusion   Intravenous Once Mike Gip, Melissa C, MD       heparin lock flush 100 UNIT/ML injection            heparin lock flush 100 unit/mL  500 Units Intravenous Once Earlie Server, MD       magnesium sulfate IVPB 2 g 50 mL  2 g Intravenous Once Corcoran, Melissa C, MD       sodium chloride flush (NS) 0.9 % injection 10 mL  10 mL Intravenous Once Earlie Server, MD        Allergies No Known Allergies  Histories Past Medical History:  Diagnosis Date   Anxiety    Cancer ([facility])    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   Claustrophobia    Diabetes mellitus without complication ([facility])    Esophageal adenocarcinoma ([facility]) 04/21/2021   GERD (gastroesophageal reflux disease)    Glaucoma    using gtts for this.   Hypercholesterolemia    Hypertension    Past Surgical History:  Procedure Laterality Date   CATARACT EXTRACTION W/PHACO Left 12/03/2017   Procedure: CATARACT EXTRACTION PHACO AND INTRAOCULAR LENS PLACEMENT ([facility]) COMPLICATED DIABETIC LEFT;  Surgeon: Leandrew Koyanagi, MD;  Location: [facility];  Service: Ophthalmology;  Laterality: Left;  Diabetic - oral meds   CHOLECYSTECTOMY     COLONOSCOPY  2016   PORTA CATH INSERTION N/A 11/13/2020    Procedure: PORTA CATH INSERTION;  Surgeon: Algernon Huxley, MD;  Location: [facility];  Service: Cardiovascular;  Laterality: N/A;   Social History   Socioeconomic History   Marital status: Married    Spouse name: Not on file   Number of children: Not on file   Years of education: Not on file   Highest education level: Not on file  Occupational History   Not on file  Tobacco Use   Smoking status: Former    Types: Cigarettes    Quit date: 19    Years since quitting: 37.3   Smokeless tobacco: Never  Vaping Use   Vaping Use: Never used  Substance and Sexual Activity   Alcohol use: Not Currently    Comment: occasional - 1 glass wine/month   Drug use: Never   Sexual activity: Not on file  Other Topics Concern   Not on file  Social History Narrative   Not on file   Social Determinants of Health   Financial Resource Strain: Not on file  Food Insecurity: Not on file  Transportation Needs: Not on file  Physical Activity: Not on file  Stress: Not on file  Social Connections: Not on file  Intimate Partner Violence: Not on file   Family History  Problem Relation Age of Onset   Cancer Mother    Colon cancer Neg Hx    Colon polyps Neg Hx    Esophageal cancer Neg Hx    Rectal cancer Neg Hx    Stomach cancer Neg Hx    Inflammatory bowel disease Neg Hx    Liver disease Neg Hx    Pancreatic cancer Neg Hx    I have reviewed his medical, social, and family history in detail and updated the electronic medical record as necessary.    PHYSICAL EXAMINATION  BP (!) 150/70   Pulse (!) 53   Ht '5\' 10"'$  (1.778 m)   Wt 176 lb (79.8 kg)   BMI 25.25 kg/m  Wt Readings from Last 3 Encounters:  03/12/22 176 lb (79.8 kg)  01/28/22 173 lb (78.5 kg)  01/02/22 172 lb 14.4 oz (78.4 kg)  GEN: NAD, appears stated age, doesn't appear chronically ill, accompanied by wife PSYCH: Cooperative, without pressured speech EYE: Conjunctivae pink, sclerae anicteric ENT: MMM CV:  Nontachycardic RESP: No audible wheezing GI: NABS, soft, NT/ND, without rebound MSK/EXT: No significant lower extremity edema SKIN: No jaundice NEURO:  Alert & Oriented x 3, no focal deficits   REVIEW OF DATA  I reviewed the following data at the time of this encounter:  GI Procedures and Studies  March 2023 EGD - Esophageal mucosal changes secondary to established short-segment Barrett's disease.  Biopsied. - 2 cm hiatal hernia. - Normal stomach. - Normal examined duodenum.  Pathology Diagnosis 1. Surgical [P], distal esophagus at 39 cm - COLUMNAR GASTRIC-TYPE MUCOSA WITH FOCAL INTESTINAL METAPLASIA, CONSISTENT WITH BARRETT'S ESOPHAGUS. - NEGATIVE FOR DYSPLASIA. - NO SQUAMOUS MUCOSA PRESENT. 2. Surgical [P], distal esophagus at 38 cm - COLUMNAR GASTRIC-TYPE MUCOSA WITH INTESTINAL METAPLASIA, CONSISTENT WITH BARRETT'S ESOPHAGUS. - NEGATIVE FOR DYSPLASIA. - NO SQUAMOUS MUCOSA PRESENT. 3. Surgical [P], distal esophagus at 37 cm - COLUMNAR GASTRIC-TYPE MUCOSA WITH INTESTINAL METAPLASIA, CONSISTENT WITH BARRETT'S ESOPHAGUS, WITH LOW-GRADE DYSPLASIA AND FOCAL HIGH-GRADE DYSPLASIA. - FOCAL EXTRACELLULAR MUCIN IN THE LAMINA PROPRIA, WITH NO MALIGNANT CELLS IDENTIFIED. - FOCAL EVIDENCE OF ULCERATION. - SMALL FOCUS OF SQUAMOUS MUCOSA PRESENT. 4. Surgical [P], distal esophagus at 35 cm - SQUAMOCOLUMNAR MUCOSA WITH INTESTINAL METAPLASIA, CONSISTENT WITH BARRETT'S ESOPHAGUS. - NEGATIVE FOR DYSPLASIA. 5. Surgical [P], distal esophagus at 34 cm - SQUAMOCOLUMNAR MUCOSA WITH INTESTINAL METAPLASIA, CONSISTENT WITH BARRETT'S ESOPHAGUS. - NEGATIVE FOR DYSPLASIA.  December 2022 EGD - No evidence of residual esophageal tumor today. Esophageal mucosal changes consistent with short-segment Barrett's esophagus. Biopsied. - 2 cm hiatal hernia. - A single gastric polyp. Biopsied. - Normal examined duodenum.  Pathology Diagnosis 1. Surgical [P], gastric polyp - GASTRIC HYPERPLASTIC  POLYP(S) - NEGATIVE FOR INTESTINAL METAPLASIA OR DYSPLASIA 2. Surgical [P], esophagus at 41 - BARRETT'S ESOPHAGUS WITH LOW-GRADE DYSPLASIA, FOCALLY SUSPICIOUS FOR HIGH-GRADE DYSPLASIA - GRANULATION TISSUE, CONSISTENT WITH ULCERATION - EVIDENCE OF INVASIVE CARCINOMA IS NOT SEEN IN THE SUBMITTED BIOPSIES  Laboratory Studies  Reviewed those in epic and care everywhere  Imaging Studies  October 2022 PET scan IMPRESSION: No signs of increased metabolic activity in the neck to indicate residual head neck cancer. Focal  hypermetabolism in the esophagus is no longer visible. No signs of metastasis to the neck, chest, abdomen or pelvis. Aortic Atherosclerosis (ICD10-I70.0).   ASSESSMENT  Sean Garner is a 76 y.o. 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  The patient is seen today for evaluation and management of:  1. History of esophageal cancer   2. Barrett's esophagus with high grade dysplasia   3. Abnormal endoscopy of upper gastrointestinal tract   4. Chronic anticoagulation    The patient is clinically and hemodynamically stable.  I had an extensive discussion with the patient regarding the overall management of Barrett's esophagus (BE).  I reviewed the epidemiology, pathophysiology and clinical expectations of Barrett's and dysplasia.  We had a detailed discussion of BE and how it is a condition in which intestinal metaplasia (IM) replaces the normal squamous mucosa of the esophagus (typically in the lower part and at the GE junction).  Replacement is believed to be secondary to acid reflux injury that occurs in the 2nd, 3rd and 4th decades of life.  The condition of BE is noticed during endoscopy and confirmed by histopathology.  We discussed that the length of the  intestinal metaplasia is thought to remain static, however, the histopathology of the BE to cancer sequence is believed to progress along a continuum: non-dysplastic to low grade dysplasia (LGD) to high grade dysplasia (HGD) to intramucosal/early mucosal carcinoma [facility]) to advanced invasive carcinoma (AIC).  It is important to recognize that HGD and EMC may be indistinguishable histologically and the two often coexist.  It is also important to recognize that LGD and HGD may never progress to cancer and may even regress in certain circumstances.  We discussed the options for management which include but endoluminal therapies including endoscopic mucosal resection, radiofrequency ablation and cryotherapy.  We discussed the risks and benefits of each.  Treatment is always very individualized.  Age, co-morbidities, aversion to cancer risk, local surgical and endoscopic expertise in the area, are all considered when making a plan of care.  If there is no evidence of invasion, then the patient's endoscopic treatment options include radiofrequency ablation and/or endoscopic mucosal resection and/or cryotherapy.  I informed the patient and his wife that endoscopic therapy typically requires an average of 2-4 treatment sessions though frequently more sessions are required for complete eradication of all intestinal metaplasia.  These sessions will be schedule every 3-4 months until all areas are effectively treated.  The goal of endoscopic therapy is complete eradication of dysplasia and intestinal metaplasia.  It is known that approximately 1 out of every 10 patients may not respond completely to endoscopic ablation therapy and all Barrett's tissue may not be able to be fully eradicated.  I reinforced that the patient must accept life-long endoscopic surveillance and acid suppression therapy as recurrence rates of BE may be as high as 30%.  Surveillance of previously treated BE involves repeating the endoscopy every 6  months for the first year following complete eradication followed by endoscopy every 12 thereafter if no recurrence is found.   Although every effort will be made to treat all of the Barrett's esophagus endoscopically, I reinforced that endoscopic eradication therapy does not prevent the possible need for surgery down the line or other therapies to be considered.  Furthermore, endoscopic therapy does not completely eliminate the risk of developing esophageal cancer, it does decrease  risk though.  I also discussed the potential complications of endoluminal therapy which includes, but is not limited to: perforation, bleeding, and stricture formation.  I also reinforced that it is common to experience pain following radiofrequency ablation and this would be managed appropriately with pain medications post-procedure.  At this point in time, I believe the patient remains an appropriate candidate for RFA ablation; in an effort of trying to diminish prior history of Barrett's high-grade dysplasia and with his known previous history of esophageal cancer.  My goal is to try to get complete eradication of dysplasia and intestinal metaplasia.  If there is evidence of nodularity or other concerning findings we can proceed with attempted EMR.  As it is known approximately 10% of patients may not respond completely to endoscopic ablation therapy but we should attempt to try to control this as best we can to minimize risk of developing another esophageal adenocarcinoma.  The patient is agreeable to proceed.  All patient questions were answered, to the best of my ability, and the patient agrees to the aforementioned plan of action with follow-up as indicated.   PLAN  Proceed with scheduled EGD next month - RFA if no evidence of nodularities - EMR if any evidence of nodularity Obtain approval for anticoagulation hold   No orders of the defined types were placed in this encounter.   New Prescriptions   No medications  on file   Modified Medications   No medications on file    Planned Follow Up No follow-ups on file.   Total Time in Face-to-Face and in Coordination of Care for patient including independent/personal interpretation/review of prior testing, medical history, examination, medication adjustment, communicating results with the patient directly, and documentation within the EHR is 35 minutes.   Justice Britain, MD [facility] Office # [phone]

## 2022-03-19 ENCOUNTER — Telehealth: Payer: Self-pay | Admitting: Internal Medicine

## 2022-03-19 ENCOUNTER — Other Ambulatory Visit: Payer: No Typology Code available for payment source

## 2022-03-19 ENCOUNTER — Inpatient Hospital Stay: Payer: Medicare Other

## 2022-03-19 NOTE — Telephone Encounter (Signed)
Patient called regarding a letter he received regarding the [employer] Exposure he said it needs to indicate ? ?Regarding his esophageal adenocarcinoma I feel that it is at least more likely greater than 50% than not that this malignancy could relate to his [place] including possible exposure to agent orange. ? ? ?Requested a call back. ?

## 2022-03-20 ENCOUNTER — Ambulatory Visit: Payer: No Typology Code available for payment source | Admitting: Oncology

## 2022-03-20 ENCOUNTER — Encounter: Payer: Self-pay | Admitting: Internal Medicine

## 2022-03-20 NOTE — Telephone Encounter (Signed)
Letter mailed to pt.  

## 2022-03-20 NOTE — Telephone Encounter (Signed)
Left message for pt to call back. ? ?Pt states that the letter he got recently is exactly the same letter he got in March. States he brought an example of what the letter has to state for the [place]. ? ?He states the part about Regarding his esophageal adenocarcinoma I feel that it is more likely than not (greater than 50%) that this malignancy could relate to his [place] including possible exposure to agent orange. Dr. Hilarie Fredrickson notified. ?

## 2022-03-20 NOTE — Telephone Encounter (Signed)
See updated letter routed today ?

## 2022-04-01 ENCOUNTER — Other Ambulatory Visit: Payer: Self-pay | Admitting: Psychiatry

## 2022-04-01 ENCOUNTER — Encounter (HOSPITAL_COMMUNITY): Payer: Self-pay | Admitting: Gastroenterology

## 2022-04-01 DIAGNOSIS — F5101 Primary insomnia: Secondary | ICD-10-CM

## 2022-04-01 NOTE — Progress Notes (Signed)
Attempted to obtain medical history via telephone, unable to reach at this time. I left a voicemail to return pre surgical testing department's phone call.  

## 2022-04-08 ENCOUNTER — Other Ambulatory Visit: Payer: Self-pay

## 2022-04-08 ENCOUNTER — Ambulatory Visit (HOSPITAL_COMMUNITY): Payer: No Typology Code available for payment source | Admitting: Anesthesiology

## 2022-04-08 ENCOUNTER — Encounter (HOSPITAL_COMMUNITY): Payer: Self-pay | Admitting: Gastroenterology

## 2022-04-08 ENCOUNTER — Telehealth: Payer: Self-pay | Admitting: Gastroenterology

## 2022-04-08 ENCOUNTER — Ambulatory Visit (HOSPITAL_COMMUNITY)
Admission: RE | Admit: 2022-04-08 | Discharge: 2022-04-08 | Disposition: A | Discharge status: Home / Self Care | Payer: No Typology Code available for payment source | Attending: Gastroenterology | Admitting: Gastroenterology

## 2022-04-08 ENCOUNTER — Ambulatory Visit (HOSPITAL_BASED_OUTPATIENT_CLINIC_OR_DEPARTMENT_OTHER): Payer: No Typology Code available for payment source | Admitting: Anesthesiology

## 2022-04-08 ENCOUNTER — Encounter (HOSPITAL_COMMUNITY)
Admission: RE | Disposition: A | Discharge status: Home / Self Care | Payer: Self-pay | Source: Home / Self Care | Attending: Gastroenterology

## 2022-04-08 DIAGNOSIS — Z86711 Personal history of pulmonary embolism: Secondary | ICD-10-CM | POA: Insufficient documentation

## 2022-04-08 DIAGNOSIS — F419 Anxiety disorder, unspecified: Secondary | ICD-10-CM | POA: Insufficient documentation

## 2022-04-08 DIAGNOSIS — K3189 Other diseases of stomach and duodenum: Secondary | ICD-10-CM | POA: Insufficient documentation

## 2022-04-08 DIAGNOSIS — Z87891 Personal history of nicotine dependence: Secondary | ICD-10-CM | POA: Diagnosis not present

## 2022-04-08 DIAGNOSIS — E1139 Type 2 diabetes mellitus with other diabetic ophthalmic complication: Secondary | ICD-10-CM | POA: Diagnosis not present

## 2022-04-08 DIAGNOSIS — Z86718 Personal history of other venous thrombosis and embolism: Secondary | ICD-10-CM | POA: Insufficient documentation

## 2022-04-08 DIAGNOSIS — I10 Essential (primary) hypertension: Secondary | ICD-10-CM | POA: Insufficient documentation

## 2022-04-08 DIAGNOSIS — K22719 Barrett's esophagus with dysplasia, unspecified: Secondary | ICD-10-CM

## 2022-04-08 DIAGNOSIS — Z9221 Personal history of antineoplastic chemotherapy: Secondary | ICD-10-CM | POA: Diagnosis not present

## 2022-04-08 DIAGNOSIS — Z7901 Long term (current) use of anticoagulants: Secondary | ICD-10-CM | POA: Insufficient documentation

## 2022-04-08 DIAGNOSIS — K219 Gastro-esophageal reflux disease without esophagitis: Secondary | ICD-10-CM | POA: Diagnosis not present

## 2022-04-08 DIAGNOSIS — E119 Type 2 diabetes mellitus without complications: Secondary | ICD-10-CM | POA: Diagnosis not present

## 2022-04-08 DIAGNOSIS — H42 Glaucoma in diseases classified elsewhere: Secondary | ICD-10-CM | POA: Insufficient documentation

## 2022-04-08 DIAGNOSIS — K22711 Barrett's esophagus with high grade dysplasia: Secondary | ICD-10-CM

## 2022-04-08 DIAGNOSIS — K449 Diaphragmatic hernia without obstruction or gangrene: Secondary | ICD-10-CM

## 2022-04-08 DIAGNOSIS — Z923 Personal history of irradiation: Secondary | ICD-10-CM | POA: Diagnosis not present

## 2022-04-08 DIAGNOSIS — Z9889 Other specified postprocedural states: Secondary | ICD-10-CM | POA: Diagnosis not present

## 2022-04-08 DIAGNOSIS — Z85810 Personal history of malignant neoplasm of tongue: Secondary | ICD-10-CM | POA: Diagnosis not present

## 2022-04-08 DIAGNOSIS — Z8501 Personal history of malignant neoplasm of esophagus: Secondary | ICD-10-CM

## 2022-04-08 HISTORY — PX: GI RADIOFREQUENCY ABLATION: SHX6807

## 2022-04-08 HISTORY — PX: ESOPHAGOGASTRODUODENOSCOPY (EGD) WITH PROPOFOL: SHX5813

## 2022-04-08 LAB — GLUCOSE, CAPILLARY: Glucose-Capillary: 156 mg/dL — ABNORMAL HIGH (ref 70–99)

## 2022-04-08 SURGERY — ESOPHAGOGASTRODUODENOSCOPY (EGD) WITH PROPOFOL
Anesthesia: Monitor Anesthesia Care

## 2022-04-08 MED ORDER — PROPOFOL 500 MG/50ML IV EMUL
INTRAVENOUS | Status: DC | PRN
  Administered 2022-04-08: 125 ug/kg/min via INTRAVENOUS

## 2022-04-08 MED ORDER — HYDRALAZINE HCL 20 MG/ML IJ SOLN
10.0000 mg | Freq: Once | INTRAMUSCULAR | Status: AC
  Administered 2022-04-08: 10 mg via INTRAVENOUS

## 2022-04-08 MED ORDER — ACETAMINOPHEN-CODEINE 120-12 MG/5ML PO SOLN: 5.0000 mL | Freq: Four times a day (QID) | ORAL | 0 refills | Status: DC | PRN

## 2022-04-08 MED ORDER — PROPOFOL 10 MG/ML IV BOLUS
INTRAVENOUS | Status: AC
  Filled 2022-04-08: qty 20

## 2022-04-08 MED ORDER — PANTOPRAZOLE SODIUM 40 MG PO TBEC: 40.0000 mg | DELAYED_RELEASE_TABLET | Freq: Two times a day (BID) | ORAL | 6 refills | Status: DC

## 2022-04-08 MED ORDER — SUCRALFATE 1 GM/10ML PO SUSP: 1.0000 g | Freq: Four times a day (QID) | ORAL | 2 refills | Status: DC

## 2022-04-08 MED ORDER — STERILE WATER FOR INJECTION IJ SOLN
RESPIRATORY_TRACT | Status: DC | PRN
  Administered 2022-04-08: 60 mL via OROMUCOSAL

## 2022-04-08 MED ORDER — SODIUM CHLORIDE 0.9 % IV SOLN: INTRAVENOUS | Status: DC

## 2022-04-08 MED ORDER — PROPOFOL 10 MG/ML IV BOLUS
INTRAVENOUS | Status: DC | PRN
  Administered 2022-04-08: 20 mg via INTRAVENOUS

## 2022-04-08 MED ORDER — LIDOCAINE VISCOUS HCL 2 % MT SOLN: 30.0000 mL | Freq: Four times a day (QID) | OROMUCOSAL | 2 refills | Status: DC

## 2022-04-08 MED ORDER — HYDRALAZINE HCL 20 MG/ML IJ SOLN
INTRAMUSCULAR | Status: AC
  Filled 2022-04-08: qty 1

## 2022-04-08 MED ORDER — LACTATED RINGERS IV SOLN
INTRAVENOUS | Status: DC
Start: 2022-04-08 — End: 2022-04-08
  Administered 2022-04-08: 1000 mL via INTRAVENOUS

## 2022-04-08 MED ORDER — APIXABAN 2.5 MG PO TABS: 2.5000 mg | ORAL_TABLET | Freq: Two times a day (BID) | ORAL | Status: DC

## 2022-04-08 MED ORDER — PROPOFOL 500 MG/50ML IV EMUL
INTRAVENOUS | Status: AC
Start: 2022-04-08 — End: ?
  Filled 2022-04-08: qty 50

## 2022-04-08 SURGICAL SUPPLY — 15 items

## 2022-04-08 NOTE — Interval H&P Note (Signed)
History and Physical Interval Note:  04/08/2022 10:12 AM  Sean Garner.  has presented today for surgery, with the diagnosis of barretts.  The various methods of treatment have been discussed with the patient and family. After consideration of risks, benefits and other options for treatment, the patient has consented to  Procedure(s): ESOPHAGOGASTRODUODENOSCOPY (EGD) WITH PROPOFOL (N/A) as a surgical intervention.  The patient's history has been reviewed, patient examined, no change in status, stable for surgery.  I have reviewed the patient's chart and labs.  Questions were answered to the patient's satisfaction.    Patient with history of esophageal cancer status post chemo XRT without surgery.  Patient in follow-up surveillance ended up having high-grade dysplasia in Barrett's esophagus.  Here for RFA treatment and for possible EMR if any nodularity found.  This case has been discussed with Dr. Hilarie Fredrickson and with Dr. Tasia Catchings and with Dr. Elenor Quinones ([name]).   [employer]

## 2022-04-08 NOTE — Anesthesia Procedure Notes (Signed)
Procedure Name: MAC Date/Time: 04/08/2022 10:33 AM Performed by: Lollie Sails, CRNA Pre-anesthesia Checklist: Patient identified, Emergency Drugs available, Suction available, Patient being monitored and Timeout performed Oxygen Delivery Method: Nasal cannula Placement Confirmation: positive ETCO2

## 2022-04-08 NOTE — Telephone Encounter (Signed)
I called the original prescription into an alternate pharmacy. The pt's wife is picking up now.

## 2022-04-08 NOTE — Op Note (Signed)
[facility] Patient Name: Sean Garner Procedure Date: 04/08/2022 MRN: [id number] Attending MD: Justice Britain , MD Date of Birth: 18-Dec-1945 CSN: [id number] Age: 76 Admit Type: Outpatient Procedure:                Upper GI endoscopy Indications:              Follow-up of Barrett's esophagus, For endoscopic                            therapy of Barrett's esophagus with high grade                            dysplasia, Personal history of malignant esophageal                            neoplasm (s/p Chemo-XRT), Personal history of upper                            GI endoscopy Providers:                Justice Britain, MD, Burtis Junes, RN, [facility]                            Technician, Technician Referring MD:             Lajuan Lines. Pyrtle, MD, Dr. Tasia Catchings, Dr. Elenor Quinones Medicines:                Monitored Anesthesia Care Complications:            No immediate complications. Estimated Blood Loss:     Estimated blood loss was minimal. Procedure:                Pre-Anesthesia Assessment:                           - Prior to the procedure, a History and Physical                            was performed, and patient medications and                            allergies were reviewed. The patient's tolerance of                            previous anesthesia was also reviewed. The risks                            and benefits of the procedure and the sedation                            options and risks were discussed with the patient.                            All questions were answered, and informed consent  was obtained. Prior Anticoagulants: The patient has                            taken Eliquis (apixaban), last dose was 2 days                            prior to procedure. ASA Grade Assessment: III - A                            patient with severe systemic disease. After                            reviewing the risks and benefits, the patient was                             deemed in satisfactory condition to undergo the                            procedure.                           After obtaining informed consent, the endoscope was                            passed under direct vision. Throughout the                            procedure, the patient's blood pressure, pulse, and                            oxygen saturations were monitored continuously. The                            [id number]-[id number] ([id number]) Olympus therapeutic endoscope                            was introduced through the mouth, and advanced to                            the second part of duodenum. The upper GI endoscopy                            was accomplished without difficulty. The patient                            tolerated the procedure. Scope In: Scope Out: Findings:      No gross lesions were noted in the proximal esophagus and in the mid       esophagus.      The esophagus and gastroesophageal junction were examined with white       light and narrow band imaging (NBI) from a forward view and retroflexed       position. There were esophageal mucosal changes secondary to established       long-segment Barrett's disease. These changes involved the mucosa at  the       upper extent of the gastric folds (37 cm from the incisors) extending to       the Z-line (35 cm from the incisors). Circumferential salmon-colored       mucosa was present from 35 to 37 cm, one tongue of salmon-colored mucosa       was present from 33 to 35 cm and scattered islands of salmon-colored       mucosa were present from 30 to 35 cm. The maximum longitudinal extent of       these esophageal mucosal changes was 7 cm in length. Focal       radiofrequency ablation of Barrett's esophagus was performed. With the       endoscope in place, the position and extent of the Barrett's mucosa and       the anatomic landmarks including proximal and distal extent of Barrett's       mucosa and top  of gastric folds were noted. Endoscopic visualization       identified an ablation site including the entire visible Barrett's       segment. The Barrett's mucosa was irrigated with N-acetylcysteine       (Mucomyst) 1% mixed with water. Esophageal contents were suctioned. The       endoscope was then removed from the patient. The Barrx-90 Ultra       radiofrequency ablation catheter was attached to the tip of the       endoscope. The endoscope with the attached radiofrequency ablation       catheter was then passed transorally under direct vision into the       esophagus and advanced to the areas of Barrett's mucosa. The areas       included islands, tongues and circumferential areas of Barrett's mucosa.       The radiofrequency ablation catheter was placed in contact with the       surface of the Barrett's mucosa under direct visualization and energy       was applied twice at 12 J/cm2. Ablation was repeated in a likewise       fashion to treat the entire area of suspected Barrett's mucosa. The       ablation zone was cleaned of coagulative debris. The ablation catheter       and endoscope were then removed and the catheter was cleaned. The       catheter and endoscope were reinserted into the esophagus. A second       round of ablation was then performed. Energy was applied once at 12       J/cm2 to retreat the areas of Barrett's epithelium that had been treated       with the first series of ablation. The areas of the esophagus where       Barrett's mucosa had been ablated were examined. Areas of visible       Barrett's esophagus were completely ablated. Whitish changes of ablated       mucosa were present. The total number of energy applications for all       mucosal sites treated was 38.      A 4 cm hiatal hernia was present.      Patchy mildly erythematous mucosa without bleeding was found in the       entire examined stomach.      No gross lesions were noted in the duodenal bulb, in  the first portion  of the duodenum and in the second portion of the duodenum. Impression:               - No gross lesions in esophagus proximally.                            Esophageal mucosal changes secondary to established                            long-segment Barrett's disease distally - treated                            with radiofrequency ablation.                           - 4 cm hiatal hernia.                           - Erythematous mucosa in the stomach.                           - No gross lesions in the duodenal bulb, in the                            first portion of the duodenum and in the second                            portion of the duodenum. Moderate Sedation:      Not Applicable - Patient had care per Anesthesia. Recommendation:           - The patient will be observed post-procedure,                            until all discharge criteria are met.                           - Discharge patient to home.                           - Patient has a contact number available for                            emergencies. The signs and symptoms of potential                            delayed complications were discussed with the                            patient. Return to normal activities tomorrow.                            Written discharge instructions were provided to the                            patient.                           -  Clear liquid diet for 24 hours. Then, full liquid                            diet for 48 hours. Then, soft diet for 1 week                            thereafter. Then advance your diet thereafter to                            regular.                           - Continue your PPI 40 mg twice daily for the next                            3 months.                           - You will be prescribed a lidocaine/antiacid                            mixture that you can take up to 4 times per day as                            needed  (prescription has been sent to your                            pharmacy) - take no matter what for at least first                            3-days.                           - You will be prescribed liquid Tylenol with                            codeine that you can use up to 4 times daily for                            the next 72 hours if needed (prescription to be                            sent to your pharmacy) - I recommend using this                            even if you do not have pain for the first 2-3 days.                           - You will be prescribed Sucralfate Suspension and                            you will take this 4 times daily (make sure you  are                            not taking any other medication 1 hour before or 1                            hour after this medication is taken) to allow                            coating and healing of your esophagus - this needs                            to be taken for at least 66-month(you should have                            refills available but call if you run out).                           - Please use Cepacol or Halls Lozenges +/-                            Chloraseptic spray for next 72-96 hours to aid in                            sore thoat should you experience this.                           - Monitor for signs/symptoms of bleeding,                            perforation, and infection. If issues please call                            our number to get further assistance as needed.                           - May restart Eliquis on 5/24 AM to decrease risk                            of post-interventional bleeding.                           - Continue present medications.                           - Repeat EGD in 423-monthper protocol.                           - The findings and recommendations were discussed                            with the patient.                           -  The findings and  recommendations were discussed                            with the patient's family. Procedure Code(s):        --- Professional ---                           [phone], Esophagogastroduodenoscopy, flexible,                            transoral; with ablation of tumor(s), polyp(s), or                            other lesion(s) (includes pre- and post-dilation                            and guide wire passage, when performed) Diagnosis Code(s):        --- Professional ---                           K44.9, Diaphragmatic hernia without obstruction or                            gangrene                           K31.89, Other diseases of stomach and duodenum                           K22.711, Barrett's esophagus with high grade                            dysplasia                           Z85.01, Personal history of malignant neoplasm of                            esophagus                           Z98.890, Other specified postprocedural states CPT copyright 2019 [employer]. All rights reserved. The codes documented in this report are preliminary and upon coder review may  be revised to meet current compliance requirements. Justice Britain, MD 04/08/2022 11:40:12 AM Number of Addenda: 0

## 2022-04-08 NOTE — Telephone Encounter (Signed)
Patients wife called and said the pharmacy does not have the GI cocktail nor the Acetaminophen she asked to please resend to [facility] [place] [phone] also requested a call to discuss further.

## 2022-04-08 NOTE — Telephone Encounter (Signed)
Can we find out if the pharmacy can do Tylenol with hydrocodone? Thanks. GM

## 2022-04-08 NOTE — Anesthesia Postprocedure Evaluation (Signed)
Anesthesia Post Note  Patient: Sean Garner.  Procedure(s) Performed: ESOPHAGOGASTRODUODENOSCOPY (EGD) WITH PROPOFOL GI RADIOFREQUENCY ABLATION     Patient location during evaluation: Endoscopy Anesthesia Type: MAC Level of consciousness: awake and alert Pain management: pain level controlled Vital Signs Assessment: post-procedure vital signs reviewed and stable Respiratory status: spontaneous breathing, nonlabored ventilation and respiratory function stable Cardiovascular status: blood pressure returned to baseline and stable Postop Assessment: no apparent nausea or vomiting Anesthetic complications: no   No notable events documented.  Last Vitals:  Vitals:   04/08/22 1259 04/08/22 1310  BP: (!) 220/91 (!) 186/60  Pulse:    Resp:    Temp:    SpO2:      Last Pain:  Vitals:   04/08/22 1200  TempSrc:   PainSc: 0-No pain                 Lidia Collum

## 2022-04-08 NOTE — Telephone Encounter (Signed)
Patient spouse called regarding cetaminophen-codeine medication. Per spouse, needs to be sent to another pharmacy that has that medication[facility] [address], [place] [address]. Please call patient to let them know.

## 2022-04-08 NOTE — Discharge Instructions (Signed)
YOU HAD AN ENDOSCOPIC PROCEDURE TODAY: Refer to the procedure report and other information in the discharge instructions given to you for any specific questions about what was found during the examination. If this information does not answer your questions, please call [place] office at [phone] to clarify.  ° °YOU SHOULD EXPECT: Some feelings of bloating in the abdomen. Passage of more gas than usual. Walking can help get rid of the air that was put into your GI tract during the procedure and reduce the bloating. If you had a lower endoscopy (such as a colonoscopy or flexible sigmoidoscopy) you may notice spotting of blood in your stool or on the toilet paper. Some abdominal soreness may be present for a day or two, also. ° °DIET: Your first meal following the procedure should be a light meal and then it is ok to progress to your normal diet. A half-sandwich or bowl of soup is an example of a good first meal. Heavy or fried foods are harder to digest and may make you feel nauseous or bloated. Drink plenty of fluids but you should avoid alcoholic beverages for 24 hours. If you had a esophageal dilation, please see attached instructions for diet.   ° °ACTIVITY: Your care partner should take you home directly after the procedure. You should plan to take it easy, moving slowly for the rest of the day. You can resume normal activity the day after the procedure however YOU SHOULD NOT DRIVE, use power tools, machinery or perform tasks that involve climbing or major physical exertion for 24 hours (because of the sedation medicines used during the test).  ° °SYMPTOMS TO REPORT IMMEDIATELY: °A gastroenterologist can be reached at any hour. Please call [phone]  for any of the following symptoms:  °Following lower endoscopy (colonoscopy, flexible sigmoidoscopy) °Excessive amounts of blood in the stool  °Significant tenderness, worsening of abdominal pains  °Swelling of the abdomen that is new, acute  °Fever of 100° or  higher  °Following upper endoscopy (EGD, EUS, ERCP, esophageal dilation) °Vomiting of blood or coffee ground material  °New, significant abdominal pain  °New, significant chest pain or pain under the shoulder blades  °Painful or persistently difficult swallowing  °New shortness of breath  °Black, tarry-looking or red, bloody stools ° °FOLLOW UP:  °If any biopsies were taken you will be contacted by phone or by letter within the next 1-3 weeks. Call [phone]  if you have not heard about the biopsies in 3 weeks.  °Please also call with any specific questions about appointments or follow up tests. ° °

## 2022-04-08 NOTE — Transfer of Care (Signed)
Immediate Anesthesia Transfer of Care Note  Patient: Sean Garner.  Procedure(s) Performed: ESOPHAGOGASTRODUODENOSCOPY (EGD) WITH PROPOFOL GI RADIOFREQUENCY ABLATION  Patient Location: PACU and Endoscopy Unit  Anesthesia Type:MAC  Level of Consciousness: awake, alert , oriented and patient cooperative  Airway & Oxygen Therapy: Patient Spontanous Breathing and Patient connected to nasal cannula oxygen  Post-op Assessment: Report given to RN, Post -op Vital signs reviewed and stable and Patient moving all extremities  Post vital signs: Reviewed and stable  Last Vitals:  Vitals Value Taken Time  BP 158/57 04/08/22 1125  Temp    Pulse 51 04/08/22 1126  Resp 15 04/08/22 1126  SpO2 100 % 04/08/22 1126  Vitals shown include unvalidated device data.  Last Pain:  Vitals:   04/08/22 0929  TempSrc: Tympanic  PainSc: 0-No pain         Complications: No notable events documented.

## 2022-04-08 NOTE — Anesthesia Preprocedure Evaluation (Signed)
Anesthesia Evaluation  Patient identified by MRN, date of birth, ID band Patient awake    Reviewed: Allergy & Precautions, NPO status , Patient's Chart, lab work & pertinent test results  History of Anesthesia Complications Negative for: history of anesthetic complications  Airway Mallampati: II  TM Distance: >3 FB Neck ROM: Full    Dental   Pulmonary former smoker, PE   Pulmonary exam normal        Cardiovascular hypertension, Pt. on medications + DVT  Normal cardiovascular exam     Neuro/Psych Anxiety negative neurological ROS     GI/Hepatic Neg liver ROS, GERD  ,Esophageal cancer   Endo/Other  diabetes, Type 2, Oral Hypoglycemic Agents  Renal/GU negative Renal ROS  negative genitourinary   Musculoskeletal negative musculoskeletal ROS (+)   Abdominal   Peds  Hematology negative hematology ROS (+) Eliquis   Anesthesia Other Findings Base of tongue carcinoma  Reproductive/Obstetrics                             Anesthesia Physical Anesthesia Plan  ASA: 3  Anesthesia Plan: MAC   Post-op Pain Management: Minimal or no pain anticipated   Induction: Intravenous  PONV Risk Score and Plan: 1 and Propofol infusion, TIVA and Treatment may vary due to age or medical condition  Airway Management Planned: Natural Airway, Nasal Cannula and Simple Face Mask  Additional Equipment: None  Intra-op Plan:   Post-operative Plan:   Informed Consent: I have reviewed the patients History and Physical, chart, labs and discussed the procedure including the risks, benefits and alternatives for the proposed anesthesia with the patient or authorized representative who has indicated his/her understanding and acceptance.       Plan Discussed with:   Anesthesia Plan Comments:         Anesthesia Quick Evaluation

## 2022-04-08 NOTE — Telephone Encounter (Signed)
Per the pt wife the pharmacy that the tylenol with codeine that was sent to the pharmacy  is not available.  I have called in to [facility] in [place]. I have advise the pt and his wife.  She tells me that the pharmacy has been able to fill the GI cocktail and she does not need anything further for that.

## 2022-04-09 ENCOUNTER — Encounter (HOSPITAL_COMMUNITY): Payer: Self-pay | Admitting: Gastroenterology

## 2022-04-22 ENCOUNTER — Encounter
Admission: RE | Admit: 2022-04-22 | Discharge: 2022-04-22 | Disposition: A | Discharge status: Home / Self Care | Payer: Medicare Other | Source: Ambulatory Visit | Attending: Oncology | Admitting: Oncology

## 2022-04-22 ENCOUNTER — Other Ambulatory Visit: Payer: Self-pay | Admitting: *Deleted

## 2022-04-22 ENCOUNTER — Inpatient Hospital Stay: Payer: Medicare Other | Attending: Oncology

## 2022-04-22 DIAGNOSIS — Z7901 Long term (current) use of anticoagulants: Secondary | ICD-10-CM | POA: Diagnosis not present

## 2022-04-22 DIAGNOSIS — I2699 Other pulmonary embolism without acute cor pulmonale: Secondary | ICD-10-CM | POA: Insufficient documentation

## 2022-04-22 DIAGNOSIS — I2510 Atherosclerotic heart disease of native coronary artery without angina pectoris: Secondary | ICD-10-CM | POA: Insufficient documentation

## 2022-04-22 DIAGNOSIS — I700 Atherosclerosis of aorta: Secondary | ICD-10-CM | POA: Diagnosis not present

## 2022-04-22 DIAGNOSIS — C01 Malignant neoplasm of base of tongue: Secondary | ICD-10-CM | POA: Diagnosis present

## 2022-04-22 DIAGNOSIS — C159 Malignant neoplasm of esophagus, unspecified: Secondary | ICD-10-CM

## 2022-04-22 DIAGNOSIS — K4090 Unilateral inguinal hernia, without obstruction or gangrene, not specified as recurrent: Secondary | ICD-10-CM | POA: Insufficient documentation

## 2022-04-22 DIAGNOSIS — C160 Malignant neoplasm of cardia: Secondary | ICD-10-CM | POA: Insufficient documentation

## 2022-04-22 DIAGNOSIS — Z87891 Personal history of nicotine dependence: Secondary | ICD-10-CM | POA: Diagnosis not present

## 2022-04-22 DIAGNOSIS — N400 Enlarged prostate without lower urinary tract symptoms: Secondary | ICD-10-CM | POA: Insufficient documentation

## 2022-04-22 DIAGNOSIS — Z923 Personal history of irradiation: Secondary | ICD-10-CM | POA: Insufficient documentation

## 2022-04-22 DIAGNOSIS — Z79899 Other long term (current) drug therapy: Secondary | ICD-10-CM | POA: Insufficient documentation

## 2022-04-22 LAB — CBC WITH DIFFERENTIAL/PLATELET
Abs Immature Granulocytes: 0.02 10*3/uL (ref 0.00–0.07)
Basophils Absolute: 0 10*3/uL (ref 0.0–0.1)
Basophils Relative: 1 %
Eosinophils Absolute: 0 10*3/uL (ref 0.0–0.5)
Eosinophils Relative: 1 %
HCT: 40.6 % (ref 39.0–52.0)
Hemoglobin: 14 g/dL (ref 13.0–17.0)
Immature Granulocytes: 0 %
Lymphocytes Relative: 9 %
Lymphs Abs: 0.5 10*3/uL — ABNORMAL LOW (ref 0.7–4.0)
MCH: 31.4 pg (ref 26.0–34.0)
MCHC: 34.5 g/dL (ref 30.0–36.0)
MCV: 91 fL (ref 80.0–100.0)
Monocytes Absolute: 0.3 10*3/uL (ref 0.1–1.0)
Monocytes Relative: 5 %
Neutro Abs: 4.7 10*3/uL (ref 1.7–7.7)
Neutrophils Relative %: 84 %
Platelets: 180 10*3/uL (ref 150–400)
RBC: 4.46 MIL/uL (ref 4.22–5.81)
RDW: 12.4 % (ref 11.5–15.5)
WBC: 5.6 10*3/uL (ref 4.0–10.5)
nRBC: 0 % (ref 0.0–0.2)

## 2022-04-22 LAB — COMPREHENSIVE METABOLIC PANEL
ALT: 20 U/L (ref 0–44)
AST: 27 U/L (ref 15–41)
Albumin: 4 g/dL (ref 3.5–5.0)
Alkaline Phosphatase: 53 U/L (ref 38–126)
Anion gap: 4 — ABNORMAL LOW (ref 5–15)
BUN: 18 mg/dL (ref 8–23)
CO2: 28 mmol/L (ref 22–32)
Calcium: 8.6 mg/dL — ABNORMAL LOW (ref 8.9–10.3)
Chloride: 105 mmol/L (ref 98–111)
Creatinine, Ser: 0.84 mg/dL (ref 0.61–1.24)
GFR, Estimated: >60 mL/min (ref >60)
Glucose, Bld: 187 mg/dL — ABNORMAL HIGH (ref 70–99)
Potassium: 3.7 mmol/L (ref 3.5–5.1)
Sodium: 137 mmol/L (ref 135–145)
Total Bilirubin: 0.6 mg/dL (ref 0.3–1.2)
Total Protein: 6.9 g/dL (ref 6.5–8.1)

## 2022-04-22 LAB — GLUCOSE, CAPILLARY: Glucose-Capillary: 167 mg/dL — ABNORMAL HIGH (ref 70–99)

## 2022-04-22 MED ORDER — SODIUM CHLORIDE 0.9% FLUSH
10.0000 mL | Freq: Once | INTRAVENOUS | Status: AC
  Administered 2022-04-22: 10 mL via INTRAVENOUS
  Filled 2022-04-22: qty 10

## 2022-04-22 MED ORDER — HEPARIN SOD (PORK) LOCK FLUSH 100 UNIT/ML IV SOLN
500.0000 [IU] | Freq: Once | INTRAVENOUS | Status: AC
  Administered 2022-04-22: 500 [IU] via INTRAVENOUS
  Filled 2022-04-22: qty 5

## 2022-04-22 MED ORDER — FLUDEOXYGLUCOSE F - 18 (FDG) INJECTION
9.1000 | Freq: Once | INTRAVENOUS | Status: AC | PRN
  Administered 2022-04-22: 9.86 via INTRAVENOUS

## 2022-04-23 LAB — CEA: CEA: 0.7 ng/mL (ref 0.0–4.7)

## 2022-04-24 ENCOUNTER — Inpatient Hospital Stay: Payer: Medicare Other

## 2022-04-26 ENCOUNTER — Encounter: Payer: Self-pay | Admitting: Oncology

## 2022-04-26 ENCOUNTER — Inpatient Hospital Stay (HOSPITAL_BASED_OUTPATIENT_CLINIC_OR_DEPARTMENT_OTHER): Payer: Medicare Other | Admitting: Oncology

## 2022-04-26 VITALS — BP 157/75 | HR 52 | Temp 96.4°F | Resp 18 | Wt 176.8 lb

## 2022-04-26 DIAGNOSIS — C01 Malignant neoplasm of base of tongue: Secondary | ICD-10-CM

## 2022-04-26 DIAGNOSIS — I2699 Other pulmonary embolism without acute cor pulmonale: Secondary | ICD-10-CM

## 2022-04-26 DIAGNOSIS — C159 Malignant neoplasm of esophagus, unspecified: Secondary | ICD-10-CM

## 2022-04-26 MED ORDER — NYSTATIN 100000 UNIT/ML MT SUSP: 5.0000 mL | Freq: Four times a day (QID) | OROMUCOSAL | 0 refills | Status: DC

## 2022-04-28 ENCOUNTER — Encounter: Payer: Self-pay | Admitting: Hematology and Oncology

## 2022-04-28 ENCOUNTER — Encounter: Payer: Self-pay | Admitting: Oncology

## 2022-04-28 NOTE — Progress Notes (Signed)
Hematology/Oncology Progress note [phone] [phone]      Clinic Day:  04/28/2022  Referring physician: Derinda Late, MD   Assessment/Plan.   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 completed concurrent radiation and cisplatin  Surveillance images every 6 months.  Continue follow-up with ENT.  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  10/29/2021, EGD showed no invasive esophagus carcinoma.  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 Previously discussed with [name] surgery Dr.D'Amico who recommends to repeat EGD every 3 months for the first year followed by EGD every 6 months for the second year after treatments If persistent high-grade dysplastic changes., he recommends radiofrequency ablation.  04/08/2022 S/p radiofrequency ablation PET scan showed recurrent mild hypermetabolism within the lower esophagus, corresponding to the same location. Discussed with him that this is likely due to recent ablation. He will repeat EGD in August 2023.    Pulmonary embolism, bilateral ([place]) Bilateral pulmonary emboli and RLE DVT - 01/2021 Continue Eliquis to 2.5 mg twice daily for prophylaxis.    Follow up in 4 months, lab MD. Will order PET at that time.  Orders Placed This Encounter  Procedures   CBC with Differential/Platelet    Standing Status:   Future    Standing Expiration Date:   04/27/2023   Comprehensive metabolic panel    Standing Status:   Future    Standing Expiration Date:   04/26/2023   CEA     Standing Status:   Future    Standing Expiration Date:   04/26/2023     I discussed the assessment and treatment plan with the patient.  The patient was provided an opportunity to ask questions and all were answered.  The patient agreed with the plan and demonstrated an understanding of the instructions.  The patient was advised to call back if the symptoms worsen or if the condition fails to improve as anticipated.   Earlie Server, MD, PhD [facility] Hematology Oncology 04/26/2022     Chief Complaint: Sean Garner. is a 76 y.o. 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 There were no findings of extracervical metastatic disease. There was focal hypermetabolism in the distal esophagus, with possible concurrent soft tissue density lesion. Recommended correlation with endoscopy. There was vague right lower lobe low-level  hypermetabolism and ground-glass nodularity, favored minimal infection or aspiration. Incidental findings included aortic atherosclerosis, coronary artery atherosclerosis, emphysema, a tiny hiatal hernia, and prostatomegaly.   11/03/2020 Miscellaneous   11/03/2020 Upper endoscopy on  revealed a malignant esophageal tumor at the gastroesophageal junction. There were esophageal mucosal changes c/w short-segment Barrett's esophagus. There was a 2 cm hiatal hernia. There was gastritis.  Pathology in the esophagus at 36 cm revealed intramucosal adenocarcinoma arising in a background of high grade dysplasia and intestinal metaplasia; pathology at 38 cm revealed adenocarcinoma.   11/15/2020 EUS on  revealed early stage adenocarcinoma arising from Barrett's esophagus.  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      11/16/2020 Initial Diagnosis   Esophageal cancer, stage IB ([place])   04/09/2021 Miscellaneous   04/09/2021, patient was seen by Dr. Elenor Quinones and underwent EGD. Polypoid tumor was seen at the GE junction, consistent with the location of the previously visualized tumor.  With no significant difference compared to photos from other endoscopies.  The proximal esophagus is involved.  Esophagectomy will be applicable. Biopsy was taken at 38 cm.  Dr. Elenor Quinones has reached out radiation oncology Dr. Baruch Gouty and recommend neoadjuvant chemo and radiation.   05/23/2021 Concurrent Chemotherapy   05/23/2021-07/04/2021 weekly Carboplatin/ taxol  + RT finished 07/09/2021   09/12/2021 Imaging    PET showed no signs of increased hypermetabolic activity in the neck to indicate residual head neck cancer.  No hypermetabolism in the esophagus. No signs of metastasis to the neck, chest, abdomen or pelvis.   10/29/2021 Miscellaneous   EGD showed no evidence of residual esophageal tumor today.  Esophageal mucosal changes consistent with short segment Barrett's esophagus.  Biopsied.  2 cm  hiatal hernia.  A single gastric polyp, biopsied.  Pathology showed gastric hyperplastic polyps, Barrett's esophagus, low-grade dysplasia, focally suspicious for high-grade dysplasia.  Evidence of invasive carcinoma was not seen in the submitted biopsies   01/28/2022 Miscellaneous   EGD showed short segment Barrett's disease, distal esophagus at 37 cm biopsy showed low-grade dysplastic and focal high-grade dysplasia.    Miscellaneous     04/08/2022 Miscellaneous   EGD showed no gross lesions in esophagus approximately.  Esophageal mucosal changes secondary to established long segment Barrett's disease distantly.  Treated with radiofrequency ablation. 4 cm hiatal hernia.  Erythematous mucosa in the stomach.  No gross lesions in the duodenal bulb, first portion of the duodenum and in the second portion of duodenum.   Esophageal adenocarcinoma ([place])  04/21/2021 Initial Diagnosis   Esophageal adenocarcinoma ([place])   05/23/2021 -  Chemotherapy    Patient is on Treatment Plan: ESOPHAGUS CARBOPLATIN/PACLITAXEL WEEKLY X 6 WEEKS WITH XRT           INTERVAL HISTORY Fredrich Cory. is a 76 y.o. 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  He reports feeling well. No new complaints. Appetite is fair. Weight is stable.     Past Medical History:  Diagnosis Date   Anxiety    Cancer ([place])    Head/throat  Cancer at the base of the tongue   Cataract    lt eye repair; present in rt eye but not ripe yet.   Claustrophobia    Diabetes mellitus without complication ([place])    Esophageal adenocarcinoma ([place]) 04/21/2021   GERD (gastroesophageal reflux disease)    Glaucoma    using gtts for this.   Hypercholesterolemia    Hypertension     Past Surgical History:  Procedure Laterality Date   CATARACT EXTRACTION W/PHACO Left 12/03/2017   Procedure:  CATARACT EXTRACTION PHACO AND INTRAOCULAR LENS PLACEMENT ([place]) COMPLICATED DIABETIC LEFT;  Surgeon: Leandrew Koyanagi, MD;  Location: [facility];  Service: Ophthalmology;  Laterality: Left;  Diabetic - oral meds   CHOLECYSTECTOMY     COLONOSCOPY  2016   ESOPHAGOGASTRODUODENOSCOPY (EGD) WITH PROPOFOL N/A 04/08/2022   Procedure: ESOPHAGOGASTRODUODENOSCOPY (EGD) WITH PROPOFOL;  Surgeon: Rush Landmark Telford Nab., MD;  Location: [facility];  Service: Gastroenterology;  Laterality: N/A;   GI RADIOFREQUENCY ABLATION  04/08/2022   Procedure: GI RADIOFREQUENCY ABLATION;  Surgeon: Rush Landmark Telford Nab., MD;  Location: [facility];  Service: Gastroenterology;;   PORTA CATH INSERTION N/A 11/13/2020   Procedure: PORTA CATH INSERTION;  Surgeon: Algernon Huxley, MD;  Location: [facility];  Service: Cardiovascular;  Laterality: N/A;    Family History  Problem Relation Age of Onset   Cancer Mother    Colon cancer Neg Hx    Colon polyps Neg Hx    Esophageal cancer Neg Hx    Rectal cancer Neg Hx    Stomach cancer Neg Hx    Inflammatory bowel disease Neg Hx    Liver disease Neg Hx    Pancreatic cancer Neg Hx     Social History:  reports that he quit smoking about 37 years ago. His smoking use included cigarettes. He has never used smokeless tobacco. He reports that he does not currently use alcohol. He reports that he does not use drugs. He quit smoking cold [place] in 1986. He smoked 2 packs per day for 20 years. 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 He was exposed to [employer]. He worked with a Geologist, engineering for 34 years. The patient is accompanied by his wife today.  Allergies: No Known Allergies  Current Medications: Current Outpatient Medications  Medication Sig Dispense Refill   apixaban (ELIQUIS) 2.5 MG TABS tablet Take 1 tablet (2.5 mg total) by mouth 2 (two) times daily. 60 tablet    betamethasone valerate lotion (VALISONE) 0.1 % Apply 1 application.  topically 2 (two) times daily as needed (dry scalp).     brimonidine-timolol (COMBIGAN) 0.2-0.5 % ophthalmic solution Place 1 drop into the left eye 2 (two) times daily.     calcium carbonate (OS-CAL) 600 MG TABS tablet Take 600 mg by mouth daily.     cholecalciferol (VITAMIN D3) 25 MCG (1000 UNIT) tablet Take 1,000 Units by mouth daily.     citalopram (CELEXA) 10 MG tablet TAKE 1 TABLET BY MOUTH EVERY DAY 90 tablet 1   glucose blood test strip      losartan (COZAAR) 50 MG tablet Take 50 mg by mouth daily.     Magnesium 400 MG TABS Take 400 mg by mouth daily.     metFORMIN (GLUCOPHAGE-XR) 500 MG 24 hr tablet Take 500 mg by mouth daily with breakfast.     nystatin (MYCOSTATIN) 100000 UNIT/ML suspension Take 5 mLs (500,000 Units total) by mouth 4 (four) times daily. Swish and spit 473 mL 0  pantoprazole (PROTONIX) 40 MG tablet Take 1 tablet (40 mg total) by mouth 2 (two) times daily before a meal. 60 tablet 6   simvastatin (ZOCOR) 40 MG tablet Take 40 mg by mouth daily at 6 PM.     traZODone (DESYREL) 50 MG tablet TAKE 1 TABLET BY MOUTH AT BEDTIME AS NEEDED FOR SLEEP. (Patient taking differently: Take 50 mg by mouth at bedtime.) 90 tablet 1   vitamin B-12 (CYANOCOBALAMIN) 1000 MCG tablet Take 1,000 mcg by mouth daily.     acetaminophen-codeine 120-12 MG/5ML solution Take 5 mLs by mouth every 6 (six) hours as needed for moderate pain. (Patient not taking: Reported on 04/26/2022) 120 mL 0   Phenylephrine HCl (4-WAY FAST ACTING NA) Place 1 spray into the nose daily as needed (congestion). (Patient not taking: Reported on 04/26/2022)     sucralfate (CARAFATE) 1 GM/10ML suspension Take 10 mLs (1 g total) by mouth 4 (four) times daily. (Patient not taking: Reported on 04/26/2022) 420 mL 2   zinc gluconate 50 MG tablet Take 50 mg by mouth daily. (Patient not taking: Reported on 04/26/2022)     Current Facility-Administered Medications  Medication Dose Route Frequency Provider Last Rate Last Admin   0.9 %   sodium chloride infusion  500 mL Intravenous Once Pyrtle, Lajuan Lines, MD       Facility-Administered Medications Ordered in Other Visits  Medication Dose Route Frequency Provider Last Rate Last Admin   0.9 % NaCl with KCl 20 mEq/ L  infusion   Intravenous Once Corcoran, Melissa C, MD       heparin lock flush 100 UNIT/ML injection            heparin lock flush 100 unit/mL  500 Units Intravenous Once Earlie Server, MD       magnesium sulfate IVPB 2 g 50 mL  2 g Intravenous Once Corcoran, Melissa C, MD       sodium chloride flush (NS) 0.9 % injection 10 mL  10 mL Intravenous Once Earlie Server, MD        Review of Systems  Constitutional: Negative.  Negative for chills, fever, malaise/fatigue and weight loss.  HENT:  Negative for congestion, ear pain and tinnitus.   Eyes: Negative.  Negative for blurred vision and double vision.  Respiratory: Negative.  Negative for cough, sputum production and shortness of breath.   Cardiovascular:  Negative for chest pain and palpitations.  Gastrointestinal: Negative.  Negative for abdominal pain, constipation, diarrhea, nausea and vomiting.  Genitourinary:  Negative for dysuria, frequency and urgency.  Musculoskeletal:  Negative for back pain and falls.  Skin: Negative.  Negative for rash.  Neurological: Negative.  Negative for weakness and headaches.  Endo/Heme/Allergies: Negative.  Does not bruise/bleed easily.  Psychiatric/Behavioral: Negative.  Negative for depression. The patient is not nervous/anxious and does not have insomnia.    Performance status (ECOG): 1  Vitals Blood pressure (!) 157/75, pulse (!) 52, temperature (!) 96.4 F (35.8 C), resp. rate 18, weight 176 lb 12.8 oz (80.2 kg).    Physical Exam Constitutional:      General: He is not in acute distress.    Appearance: He is not diaphoretic.  HENT:     Head: Normocephalic and atraumatic.     Nose: Nose normal.     Mouth/Throat:     Pharynx: No oropharyngeal exudate.  Eyes:     General: No  scleral icterus.    Pupils: Pupils are equal, round, and reactive to light.  Cardiovascular:  Rate and Rhythm: Normal rate and regular rhythm.     Heart sounds: No murmur heard. Pulmonary:     Effort: Pulmonary effort is normal. No respiratory distress.     Breath sounds: No rales.  Chest:     Chest wall: No tenderness.  Abdominal:     General: There is no distension.     Palpations: Abdomen is soft.     Tenderness: There is no abdominal tenderness.  Musculoskeletal:        General: Normal range of motion.     Cervical back: Normal range of motion and neck supple.  Skin:    General: Skin is warm and dry.     Findings: No erythema.  Neurological:     Mental Status: He is alert and oriented to person, place, and time.     Cranial Nerves: No cranial nerve deficit.     Motor: No abnormal muscle tone.     Coordination: Coordination normal.  Psychiatric:        Mood and Affect: Affect normal.     No visits with results within 3 Day(s) from this visit.  Latest known visit with results is:  Hospital Outpatient Visit on 04/22/2022  Component Date Value Ref Range Status   Glucose-Capillary 04/22/2022 167 (H)  70 - 99 mg/dL Final   Glucose reference range applies only to samples taken after fasting for at least 8 hours.

## 2022-04-28 NOTE — Assessment & Plan Note (Signed)
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 completed concurrent radiation and cisplatin  Surveillance images every 6 months.  Continue follow-up with ENT.

## 2022-04-28 NOTE — Assessment & Plan Note (Signed)
Bilateral pulmonary emboli and RLE DVT - 01/2021 Continue Eliquis to 2.5 mg twice daily for prophylaxis.  

## 2022-04-28 NOTE — Assessment & Plan Note (Signed)
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  10/29/2021, EGD showed no invasive esophagus carcinoma.  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 Previously discussed with [name] surgery Dr.D'Amico who recommends to repeat EGD every 3 months for the first year followed by EGD every 6 months for the second year after treatments If persistent high-grade dysplastic changes., he recommends radiofrequency ablation.  04/08/2022 S/p radiofrequency ablation PET scan showed recurrent mild hypermetabolism within the lower esophagus, corresponding to the same location. Discussed with him that this is likely due to recent ablation. He will repeat EGD in August 2023.

## 2022-05-04 ENCOUNTER — Other Ambulatory Visit: Payer: Self-pay | Admitting: Nurse Practitioner

## 2022-07-02 ENCOUNTER — Encounter: Payer: Self-pay | Admitting: Psychiatry

## 2022-07-02 ENCOUNTER — Ambulatory Visit (INDEPENDENT_AMBULATORY_CARE_PROVIDER_SITE_OTHER): Payer: Medicare Other | Admitting: Psychiatry

## 2022-07-02 VITALS — BP 146/79 | HR 53 | Temp 98.1°F | Wt 187.0 lb

## 2022-07-02 DIAGNOSIS — F411 Generalized anxiety disorder: Secondary | ICD-10-CM | POA: Diagnosis not present

## 2022-07-02 DIAGNOSIS — F4312 Post-traumatic stress disorder, chronic: Secondary | ICD-10-CM

## 2022-07-02 DIAGNOSIS — F5101 Primary insomnia: Secondary | ICD-10-CM

## 2022-07-02 MED ORDER — CITALOPRAM HYDROBROMIDE 10 MG PO TABS: 10.0000 mg | ORAL_TABLET | Freq: Every day | ORAL | 1 refills | Status: DC

## 2022-07-02 NOTE — Progress Notes (Unsigned)
[facility] MD OP Progress Note  07/02/2022 1:52 PM Sean Farris Has.  MRN:  [id number]  Chief Complaint:  Chief Complaint  Patient presents with   Follow-up: 76 year old male with history of generalized anxiety disorder, PTSD, primary insomnia, presented for medication management.   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  Patient today appeared to be alert, oriented, pleasant in session today.  Reports he just got back home from the beach.  Reports he had a good time with his family, 4 grandchildren ages between 26-22.  Patient reports he had a good time.  Patient reports overall mood symptoms are stable.  Denies any significant depressive symptoms.  Anxiety is manageable.  Reports sleep is good on the trazodone.  He does have vivid dreams although it really does not affect him much with the sleep.  Patient denies any appetite problems.  Denies any suicidality, homicidality or perceptual disturbances.  Patient denies any side effects to medications.  Reviewed notes per oncology-Dr. Irene Shipper 04/26/2022-patient with carcinoma of the base of tongue-completed radiation and cisplatin, surveillance imaging every 6 months.  Esophageal cancer, stage Ib-status post concurrent chemotherapy carboplatin/Taxol and radiation therapy with complete response.  PET scan showed recurrent mild hypermetabolism within the lower esophagus likely due to recent ambulation.  And patient completed EGD in August 2023.  Denies any other concerns today.  Visit Diagnosis:    ICD-10-CM   1. GAD (generalized anxiety disorder)  F41.1 citalopram (CELEXA) 10 MG tablet    2. Chronic post-traumatic stress disorder (PTSD)  F43.12     3. Primary insomnia  F51.01 citalopram (CELEXA) 10  MG tablet      Past Psychiatric History: Reviewed past psychiatric history from progress note on 01/01/2021.  Past trials of medications like Celexa, trazodone, mirtazapine-nightmares.  Past Medical History:  Past Medical History:  Diagnosis Date   Anxiety    Cancer ([place])    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   Claustrophobia    Diabetes mellitus without complication ([place])    Esophageal adenocarcinoma ([place]) 04/21/2021   GERD (gastroesophageal reflux disease)    Glaucoma    using gtts for this.   Hypercholesterolemia    Hypertension     Past Surgical History:  Procedure Laterality Date   CATARACT EXTRACTION W/PHACO Left 12/03/2017   Procedure: CATARACT EXTRACTION PHACO AND INTRAOCULAR LENS PLACEMENT ([place]) COMPLICATED DIABETIC LEFT;  Surgeon: Leandrew Koyanagi, MD;  Location: [facility];  Service: Ophthalmology;  Laterality: Left;  Diabetic - oral meds   CHOLECYSTECTOMY     COLONOSCOPY  2016   ESOPHAGOGASTRODUODENOSCOPY (EGD) WITH PROPOFOL N/A 04/08/2022   Procedure: ESOPHAGOGASTRODUODENOSCOPY (EGD) WITH PROPOFOL;  Surgeon: Rush Landmark Telford Nab., MD;  Location: [facility];  Service: Gastroenterology;  Laterality: N/A;   GI RADIOFREQUENCY ABLATION  04/08/2022   Procedure: GI RADIOFREQUENCY ABLATION;  Surgeon: Rush Landmark Telford Nab., MD;  Location: [facility];  Service: Gastroenterology;;   PORTA CATH INSERTION N/A 11/13/2020   Procedure: PORTA CATH INSERTION;  Surgeon: Algernon Huxley, MD;  Location: [facility];  Service: Cardiovascular;  Laterality: N/A;    Family Psychiatric History: Reviewed family psychiatric history from progress note on 01/01/2021.  Family History:  Family History  Problem Relation Age of Onset   Cancer Mother    Colon cancer Neg Hx    Colon polyps Neg Hx    Esophageal cancer Neg Hx    Rectal cancer Neg Hx    Stomach cancer Neg Hx    Inflammatory bowel  disease Neg Hx    Liver disease Neg Hx    Pancreatic cancer Neg Hx     Social History: Reviewed social history from progress note on 01/01/2021. Social History   Socioeconomic History   Marital status: Married    Spouse name: Not on file   Number of children: Not on file   Years of education: Not on file   Highest education level: Not on file  Occupational History   Not on file  Tobacco Use   Smoking status: Former    Types: Cigarettes    Quit date: 77    Years since quitting: 37.6   Smokeless tobacco: Never  Vaping Use   Vaping Use: Never used  Substance and Sexual Activity   Alcohol use: Not Currently    Comment: occasional - 1 glass wine/month   Drug use: Never   Sexual activity: Not on file  Other Topics Concern   Not on file  Social History Narrative   Not on file   Social Determinants of Health   Financial Resource Strain: Not on file  Food Insecurity: Not on file  Transportation Needs: Not on file  Physical Activity: Not on file  Stress: Not on file  Social Connections: Not on file    Allergies: No Known Allergies  Metabolic Disorder Labs: Lab Results  Component Value Date   HGBA1C 8.1 (H) 01/20/2021   MPG 185.77 01/20/2021   No results found for: "PROLACTIN" No results found for: "CHOL", "TRIG", "HDL", "CHOLHDL", "VLDL", "LDLCALC" Lab Results  Component Value Date   TSH 0.577 01/08/2021   TSH 2.077 11/27/2020    Therapeutic Level Labs: No results found for: "LITHIUM" No results found for: "VALPROATE" No results found for: "CBMZ"  Current Medications: Current Outpatient Medications  Medication Sig Dispense Refill   apixaban (ELIQUIS) 2.5 MG TABS tablet Take 1 tablet (2.5 mg total) by mouth 2 (two) times daily. (Patient taking differently: Take 2.5 mg by mouth 2 (two) times daily. Takes differently) 60 tablet    betamethasone valerate lotion (VALISONE) 0.1 % Apply 1 application. topically 2 (two) times daily as needed (dry scalp).      brimonidine-timolol (COMBIGAN) 0.2-0.5 % ophthalmic solution Place 1 drop into the left eye 2 (two) times daily.     calcium carbonate (OS-CAL) 600 MG TABS tablet Take 600 mg by mouth daily.     cholecalciferol (VITAMIN D3) 25 MCG (1000 UNIT) tablet Take 1,000 Units by mouth daily.     glucose blood test strip      losartan (COZAAR) 25 MG tablet Take 25 mg by mouth 2 (two) times daily.     Magnesium 400 MG TABS Take 400 mg by mouth daily.     metFORMIN (GLUCOPHAGE-XR) 500 MG 24 hr tablet Take 500 mg by mouth daily with breakfast.     nystatin (MYCOSTATIN) 100000 UNIT/ML suspension Take 5 mLs (500,000 Units total) by mouth 4 (four) times daily. Swish and spit 473 mL 0   pantoprazole (PROTONIX) 40 MG tablet Take 1 tablet (40 mg total) by mouth 2 (two) times daily before a meal. 60 tablet 6   Phenylephrine HCl (4-WAY FAST ACTING NA) Place 1 spray into the nose daily as  needed (congestion).     simvastatin (ZOCOR) 40 MG tablet Take 40 mg by mouth daily at 6 PM.     traZODone (DESYREL) 50 MG tablet TAKE 1 TABLET BY MOUTH AT BEDTIME AS NEEDED FOR SLEEP. (Patient taking differently: Take 50 mg by mouth at bedtime.) 90 tablet 1   vitamin B-12 (CYANOCOBALAMIN) 1000 MCG tablet Take 1,000 mcg by mouth daily.     zinc gluconate 50 MG tablet Take 50 mg by mouth daily.     acetaminophen-codeine 120-12 MG/5ML solution Take 5 mLs by mouth every 6 (six) hours as needed for moderate pain. (Patient not taking: Reported on 07/02/2022) 120 mL 0   citalopram (CELEXA) 10 MG tablet Take 1 tablet (10 mg total) by mouth daily. 90 tablet 1   sucralfate (CARAFATE) 1 GM/10ML suspension Take 10 mLs (1 g total) by mouth 4 (four) times daily. (Patient not taking: Reported on 07/02/2022) 420 mL 2   Current Facility-Administered Medications  Medication Dose Route Frequency Provider Last Rate Last Admin   0.9 %  sodium chloride infusion  500 mL Intravenous Once Pyrtle, Lajuan Lines, MD       Facility-Administered Medications Ordered in  Other Visits  Medication Dose Route Frequency Provider Last Rate Last Admin   0.9 % NaCl with KCl 20 mEq/ L  infusion   Intravenous Once Corcoran, Melissa C, MD       heparin lock flush 100 UNIT/ML injection            heparin lock flush 100 unit/mL  500 Units Intravenous Once Earlie Server, MD       magnesium sulfate IVPB 2 g 50 mL  2 g Intravenous Once Corcoran, Melissa C, MD       sodium chloride flush (NS) 0.9 % injection 10 mL  10 mL Intravenous Once Earlie Server, MD         Musculoskeletal: Strength & Muscle Tone: within normal limits Gait & Station: normal Patient leans: N/A  Psychiatric Specialty Exam: Review of Systems  Psychiatric/Behavioral: Negative.    All other systems reviewed and are negative.   Blood pressure (!) 146/79, pulse (!) 53, temperature 98.1 F (36.7 C), temperature source Temporal, weight 187 lb (84.8 kg).Body mass index is 26.83 kg/m.  General Appearance: Casual  Eye Contact:  Fair  Speech:  Clear and Coherent  Volume:  Normal  Mood:  Euthymic  Affect:  Congruent  Thought Process:  Goal Directed and Descriptions of Associations: Intact  Orientation:  Full (Time, Place, and Person)  Thought Content: Logical   Suicidal Thoughts:  No  Homicidal Thoughts:  No  Memory:  Immediate;   Fair Recent;   Fair Remote;   Fair  Judgement:  Fair  Insight:  Fair  Psychomotor Activity:  Normal  Concentration:  Concentration: Fair and Attention Span: Fair  Recall:  [employer] of Knowledge: Fair  Language: Fair  Akathisia:  No  Handed:  Right  AIMS (if indicated): done  Assets:  Communication Skills Desire for [place] Talents/Skills Transportation  ADL's:  Intact  Cognition: WNL  Sleep:  Fair   Screenings: GAD-7    Flowsheet Row Office Visit from 07/02/2022 in [facility] Office Visit from 01/30/2022 in [facility] Video Visit from 01/01/2021 in [facility]  Total GAD-7 Score '7 2 12      '$ PHQ2-9    [place] Visit from 07/02/2022 in [facility] Office Visit  from 01/30/2022 in [facility] Visit from 10/22/2021 in [facility] Office Visit from 08/06/2021 in [facility] Video Visit from 02/09/2021 in [facility]  PHQ-2 Total Score 1 0 0 0 0  PHQ-9 Total Score -- -- -- 3 --      [place] Visit from 07/02/2022 in [facility] Admission (Discharged) from 04/08/2022 in [facility] ED from 03/14/2021 in [facility] CATEGORY No Risk No Risk No Risk        Assessment and Plan: Sean Linn. is a 76 year old Caucasian male, married, has a history of PTSD, stage I right base of tongue carcinoma, stage Ib adenocarcinoma of GE junction, hypertension, hyperlipidemia, GERD, diabetes was evaluated in office today.  Patient is currently stable.  Plan GAD-stable Celexa 10 mg p.o. daily  PTSD-stable Will monitor closely  Primary insomnia-stable Trazodone 50 mg p.o. nightly as needed  Reviewed notes per Dr. Irene Shipper 04/26/2022 as noted above.  Follow-up in clinic in 4 to 6 months or sooner if needed.  This note was generated in part or whole with voice recognition software. Voice recognition is usually quite accurate but there are transcription errors that can and very often do occur. I apologize for any typographical errors that were not detected and corrected.     Ursula Alert, MD 07/02/2022, 1:52 PM

## 2022-07-10 ENCOUNTER — Encounter: Payer: Self-pay | Admitting: Radiation Oncology

## 2022-07-10 ENCOUNTER — Ambulatory Visit
Admission: RE | Admit: 2022-07-10 | Discharge: 2022-07-10 | Disposition: A | Discharge status: Home / Self Care | Payer: Medicare Other | Source: Ambulatory Visit | Attending: Radiation Oncology | Admitting: Radiation Oncology

## 2022-07-10 VITALS — BP 115/69 | HR 58 | Temp 97.1°F | Resp 20 | Wt 184.1 lb

## 2022-07-10 DIAGNOSIS — C160 Malignant neoplasm of cardia: Secondary | ICD-10-CM | POA: Diagnosis present

## 2022-07-10 DIAGNOSIS — Z923 Personal history of irradiation: Secondary | ICD-10-CM | POA: Diagnosis not present

## 2022-07-10 DIAGNOSIS — C01 Malignant neoplasm of base of tongue: Secondary | ICD-10-CM

## 2022-07-10 DIAGNOSIS — Z85810 Personal history of malignant neoplasm of tongue: Secondary | ICD-10-CM | POA: Insufficient documentation

## 2022-07-10 NOTE — Progress Notes (Signed)
Radiation Oncology Follow up Note  Name: Sean Garner.   Date:   07/10/2022 MRN:  [id number] DOB: Mar 28, 1946    This 76 y.o. 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  REFERRING PROVIDER: BDerinda Late MD  HPI: Patient is a 76year old male now out 11 months for radiation therapy for a T1b adenocarcinoma the GE junction.  He also received concurrent chemoradiation for base of tongue cancer now at a year and a half.  From both cancers he is doing well specifically denies any head and neck pain dysphagia weight loss.  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  He is otherwise currently under observation..Marland Kitchen He is being followed by repeat upper endoscopies every 6 months.  COMPLICATIONS OF TREATMENT: none  FOLLOW UP COMPLIANCE: keeps appointments   PHYSICAL EXAM:  BP 115/69 (BP Location: Left Arm, Patient Position: Sitting, Cuff Size: Normal)   Pulse (!) 58   Temp (!) 97.1 F (36.2 C) (Tympanic)   Resp 20   Wt 184 lb 1.6 oz (83.5 kg)   BMI 26.42 kg/m  Well-developed well-nourished patient in NAD. HEENT reveals PERLA, EOMI, discs not visualized.  Oral cavity is clear. No oral mucosal lesions are identified. Neck is clear without evidence of cervical or supraclavicular adenopathy. Lungs are clear to A&P. Cardiac examination is essentially unremarkable with regular rate and rhythm without murmur rub or thrill. Abdomen is benign with no organomegaly or masses noted. Motor sensory and DTR levels are equal and symmetric in the upper and lower extremities. Cranial nerves II through XII are grossly intact. Proprioception is intact. No  peripheral adenopathy or edema is identified. No motor or sensory levels are noted. Crude visual fields are within normal range.  RADIOLOGY RESULTS: PET scan reviewed compatible with above-stated findings  PLAN: Present time patient is doing well from both the head and neck standpoint as well as esophageal cancer standpoint.  Had some mild uptake in his esophagus on his last PET scan most likely related to radiofrequency ablation.  I have asked to see him back in 6 months for follow-up.  Patient is to call with any concerns at any time.  I would like to take this opportunity to thank you for allowing me to participate in the care of your patient..Noreene Filbert MD

## 2022-07-29 ENCOUNTER — Other Ambulatory Visit: Payer: Self-pay

## 2022-07-29 ENCOUNTER — Telehealth: Payer: Self-pay | Admitting: Gastroenterology

## 2022-07-29 DIAGNOSIS — K22711 Barrett's esophagus with high grade dysplasia: Secondary | ICD-10-CM

## 2022-07-29 NOTE — Telephone Encounter (Signed)
EGD scheduled, pt instructed and medications reviewed.  Patient instructions mailed to home.  Patient to call with any questions or concerns.  

## 2022-07-29 NOTE — Telephone Encounter (Signed)
Patient called, states he would like to discuss his Barrett esophagus with dysplania with a nurse to know what would be the next move to take as far as scheduling. Please call to advise.

## 2022-07-29 NOTE — Telephone Encounter (Signed)
The pt has been scheduled for 09/09/22 at 145 pm with [facility] at [facility] to hold Eliquis 2 days prior to the EGD  Left message on machine to call back

## 2022-07-29 NOTE — Telephone Encounter (Signed)
Repeat EGD in 90-month per protocol.

## 2022-08-03 ENCOUNTER — Other Ambulatory Visit: Payer: Self-pay | Admitting: Gastroenterology

## 2022-08-09 ENCOUNTER — Telehealth: Payer: Self-pay | Admitting: Oncology

## 2022-08-09 ENCOUNTER — Inpatient Hospital Stay: Payer: Medicare Other | Attending: Oncology

## 2022-08-09 DIAGNOSIS — C01 Malignant neoplasm of base of tongue: Secondary | ICD-10-CM | POA: Insufficient documentation

## 2022-08-09 DIAGNOSIS — C160 Malignant neoplasm of cardia: Secondary | ICD-10-CM | POA: Diagnosis present

## 2022-08-09 DIAGNOSIS — Z95828 Presence of other vascular implants and grafts: Secondary | ICD-10-CM

## 2022-08-09 DIAGNOSIS — Z452 Encounter for adjustment and management of vascular access device: Secondary | ICD-10-CM | POA: Diagnosis present

## 2022-08-09 MED ORDER — SODIUM CHLORIDE 0.9% FLUSH
10.0000 mL | Freq: Once | INTRAVENOUS | Status: AC
  Administered 2022-08-09: 10 mL via INTRAVENOUS
  Filled 2022-08-09: qty 10

## 2022-08-09 MED ORDER — HEPARIN SOD (PORK) LOCK FLUSH 100 UNIT/ML IV SOLN
500.0000 [IU] | Freq: Once | INTRAVENOUS | Status: AC
  Administered 2022-08-09: 500 [IU] via INTRAVENOUS
  Filled 2022-08-09: qty 5

## 2022-08-09 NOTE — Telephone Encounter (Signed)
Patient currently has appointment scheduled with Dr. Tasia Catchings for 10/9. He is having the endoscopy done 10/26 and would like to know if he should reschedule his appointment with Dr. Tasia Catchings until after that. Please advise.   Endo is ordered by Dr. Mathews Robinsons

## 2022-08-09 NOTE — Telephone Encounter (Signed)
Please move appts (lab/MD) to be a few days after 10/26 (endo). Please inform pt of appts.

## 2022-08-26 ENCOUNTER — Ambulatory Visit: Payer: Medicare Other | Admitting: Oncology

## 2022-08-26 ENCOUNTER — Other Ambulatory Visit: Payer: Medicare Other

## 2022-08-29 ENCOUNTER — Ambulatory Visit (INDEPENDENT_AMBULATORY_CARE_PROVIDER_SITE_OTHER): Payer: Medicare Other | Admitting: Dermatology

## 2022-08-29 DIAGNOSIS — L570 Actinic keratosis: Secondary | ICD-10-CM

## 2022-08-29 DIAGNOSIS — L219 Seborrheic dermatitis, unspecified: Secondary | ICD-10-CM

## 2022-08-29 DIAGNOSIS — L820 Inflamed seborrheic keratosis: Secondary | ICD-10-CM | POA: Diagnosis not present

## 2022-08-29 DIAGNOSIS — D229 Melanocytic nevi, unspecified: Secondary | ICD-10-CM

## 2022-08-29 DIAGNOSIS — Z1283 Encounter for screening for malignant neoplasm of skin: Secondary | ICD-10-CM

## 2022-08-29 DIAGNOSIS — L814 Other melanin hyperpigmentation: Secondary | ICD-10-CM

## 2022-08-29 DIAGNOSIS — B353 Tinea pedis: Secondary | ICD-10-CM | POA: Diagnosis not present

## 2022-08-29 DIAGNOSIS — Z79899 Other long term (current) drug therapy: Secondary | ICD-10-CM

## 2022-08-29 DIAGNOSIS — L720 Epidermal cyst: Secondary | ICD-10-CM | POA: Diagnosis not present

## 2022-08-29 DIAGNOSIS — L578 Other skin changes due to chronic exposure to nonionizing radiation: Secondary | ICD-10-CM

## 2022-08-29 DIAGNOSIS — L821 Other seborrheic keratosis: Secondary | ICD-10-CM

## 2022-08-29 MED ORDER — BETAMETHASONE DIPROPIONATE 0.05 % EX LOTN: TOPICAL_LOTION | Freq: Two times a day (BID) | CUTANEOUS | 0 refills | Status: DC | PRN

## 2022-08-29 MED ORDER — TERBINAFINE HCL 250 MG PO TABS: 250.0000 mg | ORAL_TABLET | Freq: Every day | ORAL | 0 refills | Status: DC

## 2022-08-29 NOTE — Patient Instructions (Addendum)
Cryotherapy Aftercare  Wash gently with soap and water everyday.   Apply Vaseline and Band-Aid daily until healed.     Due to recent changes in healthcare laws, you may see results of your pathology and/or laboratory studies on MyChart before the doctors have had a chance to review them. We understand that in some cases there may be results that are confusing or concerning to you. Please understand that not all results are received at the same time and often the doctors may need to interpret multiple results in order to provide you with the best plan of care or course of treatment. Therefore, we ask that you please give us 2 business days to thoroughly review all your results before contacting the office for clarification. Should we see a critical lab result, you will be contacted sooner.   If You Need Anything After Your Visit  If you have any questions or concerns for your doctor, please call our main line at [phone] and press option 4 to reach your doctor's medical assistant. If no one answers, please leave a voicemail as directed and we will return your call as soon as possible. Messages left after 4 pm will be answered the following business day.   You may also send us a message via MyChart. We typically respond to MyChart messages within 1-2 business days.  For prescription refills, please ask your pharmacy to contact our office. Our fax number is [phone].  If you have an urgent issue when the clinic is closed that cannot wait until the next business day, you can page your doctor at the number below.    Please note that while we do our best to be available for urgent issues outside of office hours, we are not available 24/7.   If you have an urgent issue and are unable to reach us, you may choose to seek medical care at your doctor's office, retail clinic, urgent care center, or emergency room.  If you have a medical emergency, please immediately call 911 or go to the  emergency department.  Pager Numbers  - Dr. Kowalski: [phone]  - Dr. Moye: [phone]  - Dr. Stewart: [phone]  In the event of inclement weather, please call our main line at [phone] for an update on the status of any delays or closures.  Dermatology Medication Tips: Please keep the boxes that topical medications come in in order to help keep track of the instructions about where and how to use these. Pharmacies typically print the medication instructions only on the boxes and not directly on the medication tubes.   If your medication is too expensive, please contact our office at [phone] option 4 or send us a message through MyChart.   We are unable to tell what your co-pay for medications will be in advance as this is different depending on your insurance coverage. However, we may be able to find a substitute medication at lower cost or fill out paperwork to get insurance to cover a needed medication.   If a prior authorization is required to get your medication covered by your insurance company, please allow us 1-2 business days to complete this process.  Drug prices often vary depending on where the prescription is filled and some pharmacies may offer cheaper prices.  The website www.goodrx.com contains coupons for medications through different pharmacies. The prices here do not account for what the cost may be with help from insurance (it may be cheaper with your insurance), but the website can   give you the price if you did not use any insurance.  - You can print the associated coupon and take it with your prescription to the pharmacy.  - You may also stop by our office during regular business hours and pick up a GoodRx coupon card.  - If you need your prescription sent electronically to a different pharmacy, notify our office through [facility] MyChart or by phone at [phone] option 4.     Si Usted Necesita Algo Despus de Su Visita  Tambin puede  enviarnos un mensaje a travs de MyChart. Por lo general respondemos a los mensajes de MyChart en el transcurso de 1 a 2 das hbiles.  Para renovar recetas, por favor pida a su farmacia que se ponga en contacto con nuestra oficina. Nuestro nmero de fax es el [phone].  Si tiene un asunto urgente cuando la clnica est cerrada y que no puede esperar hasta el siguiente da hbil, puede llamar/localizar a su doctor(a) al nmero que aparece a continuacin.   Por favor, tenga en cuenta que aunque hacemos todo lo posible para estar disponibles para asuntos urgentes fuera del horario de oficina, no estamos disponibles las 24 horas del da, los 7 das de la semana.   Si tiene un problema urgente y no puede comunicarse con nosotros, puede optar por buscar atencin mdica  en el consultorio de su doctor(a), en una clnica privada, en un centro de atencin urgente o en una sala de emergencias.  Si tiene una emergencia mdica, por favor llame inmediatamente al 911 o vaya a la sala de emergencias.  Nmeros de bper  - Dr. Kowalski: [phone]  - Dra. Moye: [phone]  - Dra. Stewart: [phone]  En caso de inclemencias del tiempo, por favor llame a nuestra lnea principal al [phone] para una actualizacin sobre el estado de cualquier retraso o cierre.  Consejos para la medicacin en dermatologa: Por favor, guarde las cajas en las que vienen los medicamentos de uso tpico para ayudarle a seguir las instrucciones sobre dnde y cmo usarlos. Las farmacias generalmente imprimen las instrucciones del medicamento slo en las cajas y no directamente en los tubos del medicamento.   Si su medicamento es muy caro, por favor, pngase en contacto con nuestra oficina llamando al [phone] y presione la opcin 4 o envenos un mensaje a travs de MyChart.   No podemos decirle cul ser su copago por los medicamentos por adelantado ya que esto es diferente dependiendo de la cobertura de su seguro.  Sin embargo, es posible que podamos encontrar un medicamento sustituto a menor costo o llenar un formulario para que el seguro cubra el medicamento que se considera necesario.   Si se requiere una autorizacin previa para que su compaa de seguros cubra su medicamento, por favor permtanos de 1 a 2 das hbiles para completar este proceso.  Los precios de los medicamentos varan con frecuencia dependiendo del lugar de dnde se surte la receta y alguna farmacias pueden ofrecer precios ms baratos.  El sitio web www.goodrx.com tiene cupones para medicamentos de diferentes farmacias. Los precios aqu no tienen en cuenta lo que podra costar con la ayuda del seguro (puede ser ms barato con su seguro), pero el sitio web puede darle el precio si no utiliz ningn seguro.  - Puede imprimir el cupn correspondiente y llevarlo con su receta a la farmacia.  - Tambin puede pasar por nuestra oficina durante el horario de atencin regular y recoger una tarjeta de cupones de GoodRx.  -   Si necesita que su receta se enve electrnicamente a una farmacia diferente, informe a nuestra oficina a travs de MyChart de  o por telfono llamando al [phone] y presione la opcin 4.  

## 2022-08-29 NOTE — Progress Notes (Signed)
New pt  Visit   Subjective  Sean Garner. is a 76 y.o. male who presents for the following: Annual Exam. Patient c/o rash on his scalp using Betamethasone lotion with a fair response.  The patient presents for Total-Body Skin Exam (TBSE) for skin cancer screening and mole check.  The patient has spots, moles and lesions to be evaluated, some may be new or changing and the patient has concerns that these could be cancer.   The following portions of the chart were reviewed this encounter and updated as appropriate:   Tobacco  Allergies  Meds  Problems  Med Hx  Surg Hx  Fam Hx     Review of Systems:  No other skin or systemic complaints except as noted in HPI or Assessment and Plan.  Objective  Well appearing patient in no apparent distress; mood and affect are within normal limits.  A full examination was performed including scalp, head, eyes, ears, nose, lips, neck, chest, axillae, abdomen, back, buttocks, bilateral upper extremities, bilateral lower extremities, hands, feet, fingers, toes, fingernails, and toenails. All findings within normal limits unless otherwise noted below.  Scalp Pink patches with greasy scale.   left temple x 2, scalp x 1 (3) (3) Stuck-on, waxy, tan-brown papules and plaques -- Discussed benign etiology and prognosis.   face x 1 Erythematous thin papules/macules with gritty scale.   right mid lateral back 0.5 cm Subcutaneous nodule.   Right Foot - Anterior Scaling and maceration web spaces and over distal and lateral soles.    Assessment & Plan  Seborrheic dermatitis Scalp  Seborrheic Dermatitis  -  is a chronic persistent rash characterized by pinkness and scaling most commonly of the mid face but also can occur on the scalp (dandruff), ears; mid chest, mid back and groin.  It tends to be exacerbated by stress and cooler weather.  People who have neurologic disease may experience new onset or exacerbation of existing seborrheic dermatitis.   The condition is not curable but treatable and can be controlled.   Continue Betamethasone valerate lotion apply to scalp qd-bid prn up to 5 days per week.  Related Medications betamethasone dipropionate 0.05 % lotion Apply topically 2 (two) times daily as needed (Rash).  Inflamed seborrheic keratosis (3) left temple x 2, scalp x 1 (3) Symptomatic, irritating, patient would like treated.  Destruction of lesion - left temple x 2, scalp x 1 (3) Complexity: simple   Destruction method: cryotherapy   Informed consent: discussed and consent obtained   Timeout:  patient name, date of birth, surgical site, and procedure verified Lesion destroyed using liquid nitrogen: Yes   Region frozen until ice ball extended beyond lesion: Yes   Outcome: patient tolerated procedure well with no complications   Post-procedure details: wound care instructions given    AK (actinic keratosis) face x 1 Actinic keratoses are precancerous spots that appear secondary to cumulative UV radiation exposure/sun exposure over time. They are chronic with expected duration over 1 year. A portion of actinic keratoses will progress to squamous cell carcinoma of the skin. It is not possible to reliably predict which spots will progress to skin cancer and so treatment is recommended to prevent development of skin cancer.  Recommend daily broad spectrum sunscreen SPF 30+ to sun-exposed areas, reapply every 2 hours as needed.  Recommend staying in the shade or wearing long sleeves, sun glasses (UVA+UVB protection) and wide brim hats (4-inch brim around the entire circumference of the hat). Call  for new or changing lesions.   Destruction of lesion - face x 1  Epidermal cyst right mid lateral back Benign-appearing. Exam most consistent with an epidermal inclusion cyst. Discussed that a cyst is a benign growth that can grow over time and sometimes get irritated or inflamed. Recommend observation if it is not bothersome.  Discussed option of surgical excision to remove it if it is growing, symptomatic, or other changes noted. Please call for new or changing lesions so they can be evaluated.   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 Condition is symptomatic / bothersome to patient. Not to goal.   Start Terbinafine 250 mg take 1 tablet daily Terbinafine Counseling  Terbinafine is an anti-fungal medicine that can be applied to the skin (over the counter) or taken by mouth (prescription) to treat fungal infections. The pill version is often used to treat fungal infections of the nails or scalp. While most people do not have any side effects from taking terbinafine pills, some possible side effects of the medicine can include taste changes, headache, loss of smell, vision changes, nausea, vomiting, or diarrhea.   Rare side effects can include irritation of the liver, allergic reaction, or decrease in blood counts (which may show up as not feeling well or developing an infection). If you are concerned about any of these side effects, please stop the medicine and call your doctor, or in the case of an emergency such as feeling very unwell, seek immediate medical care.    Labs reviewed from June 2023 and liver is OK  Related Medications terbinafine (LAMISIL) 250 MG tablet Take 1 tablet (250 mg total) by mouth daily.  Lentigines - Scattered tan macules - Due to sun exposure - Benign-appearing, observe - Recommend daily broad spectrum sunscreen SPF 30+ to sun-exposed areas, reapply every 2 hours as needed. - Call for any changes  Seborrheic Keratoses - Stuck-on, waxy, tan-brown papules and/or plaques  - Benign-appearing - Discussed benign etiology and prognosis. - Observe - Call for any changes  Melanocytic Nevi - Tan-brown and/or pink-flesh-colored symmetric macules and papules - Benign appearing on exam today -  Observation - Call clinic for new or changing moles - Recommend daily use of broad spectrum spf 30+ sunscreen to sun-exposed areas.   Hemangiomas - Red papules - Discussed benign nature - Observe - Call for any changes  Actinic Damage - Chronic condition, secondary to cumulative UV/sun exposure - diffuse scaly erythematous macules with underlying dyspigmentation - Recommend daily broad spectrum sunscreen SPF 30+ to sun-exposed areas, reapply every 2 hours as needed.  - Staying in the shade or wearing long sleeves, sun glasses (UVA+UVB protection) and wide brim hats (4-inch brim around the entire circumference of the hat) are also recommended for sun protection.  - Call for new or changing lesions.  Skin cancer screening performed today.   Return in about 6 weeks (around 10/10/2022) for tinea pedis .  IMarye Round, CMA, am acting as scribe for Sarina Ser, MD .  Documentation: I have reviewed the above documentation for accuracy and completeness, and I agree with the above.  Sarina Ser, MD

## 2022-09-05 ENCOUNTER — Encounter (HOSPITAL_COMMUNITY): Payer: Self-pay | Admitting: Gastroenterology

## 2022-09-11 ENCOUNTER — Encounter: Payer: Self-pay | Admitting: Dermatology

## 2022-09-12 ENCOUNTER — Ambulatory Visit (HOSPITAL_COMMUNITY)
Admission: RE | Admit: 2022-09-12 | Discharge: 2022-09-12 | Disposition: A | Discharge status: Home / Self Care | Payer: Medicare Other | Attending: Gastroenterology | Admitting: Gastroenterology

## 2022-09-12 ENCOUNTER — Encounter (HOSPITAL_COMMUNITY): Payer: Self-pay | Admitting: Gastroenterology

## 2022-09-12 ENCOUNTER — Ambulatory Visit (HOSPITAL_COMMUNITY): Payer: Medicare Other | Admitting: Certified Registered Nurse Anesthetist

## 2022-09-12 ENCOUNTER — Encounter (HOSPITAL_COMMUNITY)
Admission: RE | Disposition: A | Discharge status: Home / Self Care | Payer: Self-pay | Source: Home / Self Care | Attending: Gastroenterology

## 2022-09-12 ENCOUNTER — Ambulatory Visit (HOSPITAL_BASED_OUTPATIENT_CLINIC_OR_DEPARTMENT_OTHER): Payer: Medicare Other | Admitting: Certified Registered Nurse Anesthetist

## 2022-09-12 ENCOUNTER — Other Ambulatory Visit: Payer: Self-pay

## 2022-09-12 DIAGNOSIS — Z09 Encounter for follow-up examination after completed treatment for conditions other than malignant neoplasm: Secondary | ICD-10-CM | POA: Insufficient documentation

## 2022-09-12 DIAGNOSIS — K449 Diaphragmatic hernia without obstruction or gangrene: Secondary | ICD-10-CM | POA: Insufficient documentation

## 2022-09-12 DIAGNOSIS — Z923 Personal history of irradiation: Secondary | ICD-10-CM | POA: Insufficient documentation

## 2022-09-12 DIAGNOSIS — Z8501 Personal history of malignant neoplasm of esophagus: Secondary | ICD-10-CM

## 2022-09-12 DIAGNOSIS — E119 Type 2 diabetes mellitus without complications: Secondary | ICD-10-CM | POA: Diagnosis not present

## 2022-09-12 DIAGNOSIS — I10 Essential (primary) hypertension: Secondary | ICD-10-CM | POA: Diagnosis not present

## 2022-09-12 DIAGNOSIS — Z87891 Personal history of nicotine dependence: Secondary | ICD-10-CM | POA: Diagnosis not present

## 2022-09-12 DIAGNOSIS — Z7984 Long term (current) use of oral hypoglycemic drugs: Secondary | ICD-10-CM | POA: Insufficient documentation

## 2022-09-12 DIAGNOSIS — K22711 Barrett's esophagus with high grade dysplasia: Secondary | ICD-10-CM | POA: Insufficient documentation

## 2022-09-12 DIAGNOSIS — Z08 Encounter for follow-up examination after completed treatment for malignant neoplasm: Secondary | ICD-10-CM

## 2022-09-12 HISTORY — PX: GI RADIOFREQUENCY ABLATION: SHX6807

## 2022-09-12 HISTORY — PX: ESOPHAGOGASTRODUODENOSCOPY (EGD) WITH PROPOFOL: SHX5813

## 2022-09-12 LAB — GLUCOSE, CAPILLARY
Glucose-Capillary: 282 mg/dL — ABNORMAL HIGH (ref 70–99)
Glucose-Capillary: 246 mg/dL — ABNORMAL HIGH (ref 70–99)

## 2022-09-12 SURGERY — ESOPHAGOGASTRODUODENOSCOPY (EGD) WITH PROPOFOL
Anesthesia: Monitor Anesthesia Care

## 2022-09-12 MED ORDER — SODIUM CHLORIDE 0.9 % IV SOLN: INTRAVENOUS | Status: DC

## 2022-09-12 MED ORDER — LACTATED RINGERS IV SOLN: INTRAVENOUS | Status: DC

## 2022-09-12 MED ORDER — LIDOCAINE VISCOUS HCL 2 % MT SOLN: 30.0000 mL | Freq: Four times a day (QID) | OROMUCOSAL | 2 refills | Status: DC

## 2022-09-12 MED ORDER — PROPOFOL 10 MG/ML IV BOLUS
INTRAVENOUS | Status: DC | PRN
  Administered 2022-09-12 (×2): 20 mg via INTRAVENOUS

## 2022-09-12 MED ORDER — APIXABAN 2.5 MG PO TABS: 2.5000 mg | ORAL_TABLET | Freq: Two times a day (BID) | ORAL | Status: DC

## 2022-09-12 MED ORDER — ACETYLCYSTEINE 20 % IN SOLN
RESPIRATORY_TRACT | Status: AC
  Filled 2022-09-12: qty 4

## 2022-09-12 MED ORDER — LIDOCAINE 2% (20 MG/ML) 5 ML SYRINGE
INTRAMUSCULAR | Status: DC | PRN
  Administered 2022-09-12: 100 mg via INTRAVENOUS

## 2022-09-12 MED ORDER — PROPOFOL 500 MG/50ML IV EMUL
INTRAVENOUS | Status: DC | PRN
  Administered 2022-09-12: 125 ug/kg/min via INTRAVENOUS

## 2022-09-12 MED ORDER — PROPOFOL 1000 MG/100ML IV EMUL
INTRAVENOUS | Status: AC
  Filled 2022-09-12: qty 200

## 2022-09-12 MED ORDER — ACETYLCYSTEINE 20 % IN SOLN
RESPIRATORY_TRACT | Status: DC | PRN
  Administered 2022-09-12: 3 mL via ORAL

## 2022-09-12 MED ORDER — SUCRALFATE 1 G PO TABS: 1.0000 g | ORAL_TABLET | Freq: Four times a day (QID) | ORAL | 0 refills | Status: DC

## 2022-09-12 SURGICAL SUPPLY — 15 items

## 2022-09-12 NOTE — Transfer of Care (Signed)
Immediate Anesthesia Transfer of Care Note  Patient: Sean Garner.  Procedure(s) Performed: ESOPHAGOGASTRODUODENOSCOPY (EGD) WITH PROPOFOL GI RADIOFREQUENCY ABLATION  Patient Location: PACU and Endoscopy Unit  Anesthesia Type:MAC  Level of Consciousness: awake and patient cooperative  Airway & Oxygen Therapy: Patient Spontanous Breathing and Patient connected to face mask oxygen  Post-op Assessment: Report given to RN and Post -op Vital signs reviewed and stable  Post vital signs: Reviewed and stable  Last Vitals:  Vitals Value Taken Time  BP 110/61 09/12/22 1200  Temp    Pulse 64 09/12/22 1203  Resp 18 09/12/22 1203  SpO2 95 % 09/12/22 1203  Vitals shown include unvalidated device data.  Last Pain:  Vitals:   09/12/22 1032  TempSrc: Temporal  PainSc: 0-No pain         Complications: No notable events documented.

## 2022-09-12 NOTE — Op Note (Signed)
[facility] Patient Name: Sean Garner Procedure Date: 09/12/2022 MRN: [id number] Attending MD: Justice Britain , MD, [id number] Date of Birth: June 24, 1946 CSN: [id number] Age: 76 Admit Type: Outpatient Procedure:                Upper GI endoscopy Indications:              Therapeutic procedure, Barrett's esophagus with                            high grade dysplasia, Follow-up of previous                            ablation treatment of Barrett's esophagus, Personal                            history of malignant esophageal neoplasm Providers:                Justice Britain, MD, Doristine Johns, RN,                            Dulcy Fanny, Cletis Athens, Technician, Brien Mates, RNFA Referring MD:             Lajuan Lines. Hilarie Fredrickson, MD, Lerry Paterson, MD, Noreene Filbert, MD, Earlie Server, MD Medicines:                Monitored Anesthesia Care Complications:            No immediate complications. Estimated Blood Loss:     Estimated blood loss was minimal. Procedure:                Pre-Anesthesia Assessment:                           - Prior to the procedure, a History and Physical                            was performed, and patient medications and                            allergies were reviewed. The patient's tolerance of                            previous anesthesia was also reviewed. The risks                            and benefits of the procedure and the sedation                            options and risks were discussed with the patient.  All questions were answered, and informed consent                            was obtained. Prior Anticoagulants: The patient has                            taken Eliquis (apixaban), last dose was 2 days                            prior to procedure. ASA Grade Assessment: III - A                            patient with severe systemic disease.  After                            reviewing the risks and benefits, the patient was                            deemed in satisfactory condition to undergo the                            procedure.                           After obtaining informed consent, the endoscope was                            passed under direct vision. Throughout the                            procedure, the patient's blood pressure, pulse, and                            oxygen saturations were monitored continuously. The                            [id number] ([id number]) Olympus therapeutic endoscope                            was introduced through the mouth, and advanced to                            the second part of duodenum. The upper GI endoscopy                            was accomplished without difficulty. The patient                            tolerated the procedure. Scope In: Scope Out: Findings:      No gross lesions were noted in the proximal esophagus and in the mid       esophagus.      The esophagus and gastroesophageal junction were examined with white       light and narrow band imaging (NBI) from a  forward view and retroflexed       position. There were esophageal mucosal changes suspicious for       short-segment Barrett's esophagus. These changes involved the mucosa at       the upper extent of the gastric folds (37 cm from the incisors)       extending to the Z-line (35 cm from the incisors). This looks       significantly different and improved from previous procedure. No visible       abnormalities were present. The maximum longitudinal extent of these       esophageal mucosal changes was 2 cm in length. Focal radiofrequency       ablation of Barrett's esophagus was performed. With the endoscope in       place, the position and extent of the Barrett's mucosa and the anatomic       landmarks including proximal and distal extent of Barrett's mucosa and       top of gastric folds were noted.  Endoscopic visualization identified an       ablation site including the entire visible Barrett's segment. The       Barrett's mucosa was irrigated with N-acetylcysteine (Mucomyst) 1% mixed       with water. Esophageal contents were suctioned. The endoscope was then       removed from the patient. The Barrx-90 radiofrequency ablation catheter       was attached to the tip of the endoscope. The endoscope with the       attached radiofrequency ablation catheter was then passed transorally       under direct vision into the esophagus and advanced to the areas of       Barrett's mucosa. The areas included circumferential areas of Barrett's       mucosa. The radiofrequency ablation catheter was placed in contact with       the surface of the Barrett's mucosa under direct visualization and       energy was applied twice at 12 J/cm2. Ablation was repeated in a       likewise fashion to treat the entire area of suspected Barrett's mucosa.       The ablation zone was cleaned of coagulative debris. The ablation       catheter and endoscope were then removed and the catheter was cleaned.       The catheter and endoscope were reinserted into the esophagus. A second       round of ablation was then performed. Energy was applied twice at 12       J/cm2 to retreat the areas of Barrett's epithelium that had been treated       with the first series of ablation. The areas of the esophagus where       Barrett's mucosa had been ablated were examined. Areas of Barrett's       esophagus were completely ablated. The total number of energy       applications for all mucosal sites treated was 50.      A 3 cm hiatal hernia was present.      No other gross lesions were noted in the entire examined stomach.      No gross lesions were noted in the duodenal bulb, in the first portion       of the duodenum and in the second portion of the duodenum. Impression:               -  No gross lesions in the proximal esophagus  and in                            the mid esophagus.                           - Esophageal mucosal changes suspicious for                            short-segment Barrett's esophagus (significantly                            improved from previous EGD with RFA session).                            Treated with radiofrequency ablation as noted above.                           - 3 cm hiatal hernia.                           - No other gross lesions in the entire stomach.                           - No gross lesions in the duodenal bulb, in the                            first portion of the duodenum and in the second                            portion of the duodenum. Moderate Sedation:      Not Applicable - Patient had care per Anesthesia. Recommendation:           - The patient will be observed post-procedure,                            until all discharge criteria are met.                           - Discharge patient to home.                           - Patient has a contact number available for                            emergencies. The signs and symptoms of potential                            delayed complications were discussed with the                            patient. Return to normal activities tomorrow.                            Written discharge  instructions were provided to the                            patient.                           - Clear liquid diet for 24 hours. Then, full liquid                            diet for 48 hours. Then, soft diet for 1 week                            thereafter. Then advance your diet thereafter to                            regular.                           - Continue your PPI 40 mg twice daily for the next                            3 months.                           - You will be prescribed a lidocaine/antiacid                            mixture that you can take up to 4 times per day as                            needed  (prescription has been sent to your                            pharmacy) - take no matter what for at least first                            3-days.                           - You have been previously prescribed liquid                            Tylenol with codeine that you can use up to 4 times                            daily for the next 72 hours if needed (prescription                            to be sent to your pharmacy) - I recommend using                            this even if you do not have pain for the first 2-3  days.                           - You will be prescribed Sucralfate Suspension and                            you will take this 4 times daily (make sure you are                            not taking any other medication 1 hour before or 1                            hour after this medication is taken) to allow                            coating and healing of your esophagus - this needs                            to be taken for at least 77-month(you should have                            refills available but call if you run out).                           - May restart apixaban on 10/20 8 AM to decrease                            risk of post interventional bleeding.                           - Repeat EGD in 4 months for follow-up. Hopefully                            we may be done with our therapy and we can begin                            surveillance but we will be prepared for another                            round of RFA if needed.                           - The findings and recommendations were discussed                            with the patient.                           - The findings and recommendations were discussed                            with the patient's family. Procedure Code(s):        --- Professional ---  56389, Esophagogastroduodenoscopy, flexible,                             transoral; with ablation of tumor(s), polyp(s), or                            other lesion(s) (includes pre- and post-dilation                            and guide wire passage, when performed) Diagnosis Code(s):        --- Professional ---                           K44.9, Diaphragmatic hernia without obstruction or                            gangrene                           K22.711, Barrett's esophagus with high grade                            dysplasia                           Z09, Encounter for follow-up examination after                            completed treatment for conditions other than                            malignant neoplasm                           Z85.01, Personal history of malignant neoplasm of                            esophagus CPT copyright 2022 [employer]. All rights reserved. The codes documented in this report are preliminary and upon coder review may  be revised to meet current compliance requirements. Justice Britain, MD 09/12/2022 12:13:08 PM Number of Addenda: 0

## 2022-09-12 NOTE — Discharge Instructions (Signed)
YOU HAD AN ENDOSCOPIC PROCEDURE TODAY: Refer to the procedure report and other information in the discharge instructions given to you for any specific questions about what was found during the examination. If this information does not answer your questions, please call [place] office at [phone] to clarify.   YOU SHOULD EXPECT: Some feelings of bloating in the abdomen. Passage of more gas than usual. Walking can help get rid of the air that was put into your GI tract during the procedure and reduce the bloating. If you had a lower endoscopy (such as a colonoscopy or flexible sigmoidoscopy) you may notice spotting of blood in your stool or on the toilet paper. Some abdominal soreness may be present for a day or two, also.  DIET: Your first meal following the procedure should be a light meal and then it is ok to progress to your normal diet. A half-sandwich or bowl of soup is an example of a good first meal. Heavy or fried foods are harder to digest and may make you feel nauseous or bloated. Drink plenty of fluids but you should avoid alcoholic beverages for 24 hours. If you had a esophageal dilation, please see attached instructions for diet.    ACTIVITY: Your care partner should take you home directly after the procedure. You should plan to take it easy, moving slowly for the rest of the day. You can resume normal activity the day after the procedure however YOU SHOULD NOT DRIVE, use power tools, machinery or perform tasks that involve climbing or major physical exertion for 24 hours (because of the sedation medicines used during the test).   SYMPTOMS TO REPORT IMMEDIATELY: A gastroenterologist can be reached at any hour. Please call [phone]  for any of the following symptoms:   Following upper endoscopy (EGD, EUS, ERCP, esophageal dilation) Vomiting of blood or coffee ground material  New, significant abdominal pain  New, significant chest pain or pain under the shoulder blades  Painful or  persistently difficult swallowing  New shortness of breath  Black, tarry-looking or red, bloody stools  FOLLOW UP:  If any biopsies were taken you will be contacted by phone or by letter within the next 1-3 weeks. Call [phone]  if you have not heard about the biopsies in 3 weeks.  Please also call with any specific questions about appointments or follow up tests.  

## 2022-09-12 NOTE — Anesthesia Procedure Notes (Signed)
Procedure Name: MAC Date/Time: 09/12/2022 11:15 AM  Performed by: West Pugh, CRNAPre-anesthesia Checklist: Patient identified, Emergency Drugs available, Suction available, Patient being monitored and Timeout performed Patient Re-evaluated:Patient Re-evaluated prior to induction Placement Confirmation: positive ETCO2 Dental Injury: Teeth and Oropharynx as per pre-operative assessment  Comments: pom

## 2022-09-12 NOTE — Anesthesia Postprocedure Evaluation (Signed)
Anesthesia Post Note  Patient: Sean Garner.  Procedure(s) Performed: ESOPHAGOGASTRODUODENOSCOPY (EGD) WITH PROPOFOL GI RADIOFREQUENCY ABLATION     Patient location during evaluation: PACU Anesthesia Type: MAC Level of consciousness: awake and alert Pain management: pain level controlled Vital Signs Assessment: post-procedure vital signs reviewed and stable Respiratory status: spontaneous breathing, nonlabored ventilation and respiratory function stable Cardiovascular status: blood pressure returned to baseline and stable Postop Assessment: no apparent nausea or vomiting Anesthetic complications: no   No notable events documented.  Last Vitals:  Vitals:   09/12/22 1210 09/12/22 1220  BP: (!) 136/57 (!) 113/40  Pulse: 63 (!) 54  Resp: 16 12  Temp:    SpO2: 98% 99%    Last Pain:  Vitals:   09/12/22 1210  TempSrc:   PainSc: 0-No pain                 Lynda Rainwater

## 2022-09-12 NOTE — H&P (Signed)
GASTROENTEROLOGY PROCEDURE H&P NOTE   Primary Care Physician: Derinda Late, MD  HPI: Sean Garner. is a 76 y.o. male who presents for EGD with possible RFA for follow-up of Barrett's esophagus high-grade dysplasia in setting of previous esophageal cancer status post XRT.  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   Hypercholesterolemia    Hypertension    Past Surgical History:  Procedure Laterality Date   CATARACT EXTRACTION W/PHACO Left 12/03/2017   Procedure: CATARACT EXTRACTION PHACO AND INTRAOCULAR LENS PLACEMENT ([place]) COMPLICATED DIABETIC LEFT;  Surgeon: Leandrew Koyanagi, MD;  Location: [facility];  Service: Ophthalmology;  Laterality: Left;  Diabetic - oral meds   CHOLECYSTECTOMY     COLONOSCOPY  2016   ESOPHAGOGASTRODUODENOSCOPY (EGD) WITH PROPOFOL N/A 04/08/2022   Procedure: ESOPHAGOGASTRODUODENOSCOPY (EGD) WITH PROPOFOL;  Surgeon: Rush Landmark Telford Nab., MD;  Location: [facility];  Service: Gastroenterology;  Laterality: N/A;   GI RADIOFREQUENCY ABLATION  04/08/2022   Procedure: GI RADIOFREQUENCY ABLATION;  Surgeon: Rush Landmark Telford Nab., MD;  Location: [facility];  Service: Gastroenterology;;   PORTA CATH INSERTION N/A 11/13/2020   Procedure: PORTA CATH INSERTION;  Surgeon: Algernon Huxley, MD;  Location: [facility];  Service: Cardiovascular;  Laterality: N/A;   No current facility-administered medications for this encounter.   Facility-Administered Medications Ordered in Other Encounters  Medication Dose Route Frequency Provider Last Rate Last Admin   0.9 % NaCl with KCl 20 mEq/ L  infusion   Intravenous Once  Corcoran, Melissa C, MD       heparin lock flush 100 UNIT/ML injection            heparin lock flush 100 unit/mL  500 Units Intravenous Once Earlie Server, MD       magnesium sulfate IVPB 2 g 50 mL  2 g Intravenous Once Corcoran, Melissa C, MD       sodium chloride flush (NS) 0.9 % injection 10 mL  10 mL Intravenous Once Earlie Server, MD       No current facility-administered medications for this encounter.  Facility-Administered Medications Ordered in Other Encounters:    0.9 % NaCl with KCl 20 mEq/ L  infusion, , Intravenous, Once, Corcoran, Melissa C, MD   heparin lock flush 100 UNIT/ML injection, , , ,    heparin lock flush 100 unit/mL, 500 Units, Intravenous, Once, Earlie Server, MD   magnesium sulfate IVPB 2 g 50 mL, 2 g, Intravenous, Once, Corcoran, Melissa C, MD   sodium chloride flush (NS) 0.9 % injection 10 mL, 10 mL, Intravenous, Once, Earlie Server, MD No Known Allergies Family History  Problem Relation Age of Onset   Cancer Mother    Colon cancer Neg Hx    Colon polyps Neg Hx    Esophageal cancer Neg Hx    Rectal cancer Neg Hx    Stomach cancer Neg Hx    Inflammatory bowel disease Neg Hx    Liver disease Neg Hx    Pancreatic cancer Neg Hx    Social History   Socioeconomic History   Marital status: Married  Spouse name: Not on file   Number of children: Not on file   Years of education: Not on file   Highest education level: Not on file  Occupational History   Not on file  Tobacco Use   Smoking status: Former    Types: Cigarettes    Quit date: 86    Years since quitting: 37.8   Smokeless tobacco: Never  Vaping Use   Vaping Use: Never used  Substance and Sexual Activity   Alcohol use: Not Currently    Comment: occasional - 1 glass wine/month   Drug use: Never   Sexual activity: Not on file  Other Topics Concern   Not on file  Social History Narrative   Not on file   Social Determinants of Health   Financial Resource Strain: Not on file  Food Insecurity: Not on  file  Transportation Needs: Not on file  Physical Activity: Not on file  Stress: Not on file  Social Connections: Not on file  Intimate Partner Violence: Not on file    Physical Exam: Today's Vitals   09/05/22 1247  Weight: 86.2 kg  Height: '5\' 10"'$  (1.778 m)   Body mass index is 27.26 kg/m. GEN: NAD EYE: Sclerae anicteric ENT: MMM CV: Non-tachycardic GI: Soft, NT/ND NEURO:  Alert & Oriented x 3  Lab Results: No results for input(s): "WBC", "HGB", "HCT", "PLT" in the last 72 hours. BMET No results for input(s): "NA", "K", "CL", "CO2", "GLUCOSE", "BUN", "CREATININE", "CALCIUM" in the last 72 hours. LFT No results for input(s): "PROT", "ALBUMIN", "AST", "ALT", "ALKPHOS", "BILITOT", "BILIDIR", "IBILI" in the last 72 hours. PT/INR No results for input(s): "LABPROT", "INR" in the last 72 hours.   Impression / Plan: This is a 76 y.o.male who presents for   The risks and benefits of endoscopic evaluation/treatment were discussed with the patient and/or family; these include but are not limited to the risk of perforation, infection, bleeding, missed lesions, lack of diagnosis, severe illness requiring hospitalization, as well as anesthesia and sedation related illnesses.  The patient's history has been reviewed, patient examined, no change in status, and deemed stable for procedure.  The patient and/or family is agreeable to proceed.    Justice Britain, MD [facility] Office # [phone]

## 2022-09-12 NOTE — Anesthesia Preprocedure Evaluation (Signed)
Anesthesia Evaluation  Patient identified by MRN, date of birth, ID band Patient awake    Reviewed: Allergy & Precautions, NPO status , Patient's Chart, lab work & pertinent test results  History of Anesthesia Complications Negative for: history of anesthetic complications  Airway Mallampati: II  TM Distance: >3 FB Neck ROM: Full    Dental   Pulmonary former smoker, PE   Pulmonary exam normal        Cardiovascular hypertension, Pt. on medications + DVT  Normal cardiovascular exam     Neuro/Psych Anxiety negative neurological ROS     GI/Hepatic Neg liver ROS, GERD  ,Esophageal cancer   Endo/Other  diabetes, Type 2, Oral Hypoglycemic Agents  Renal/GU negative Renal ROS  negative genitourinary   Musculoskeletal  (+) Arthritis , Osteoarthritis,    Abdominal   Peds  Hematology negative hematology ROS (+) Eliquis   Anesthesia Other Findings Base of tongue carcinoma  Reproductive/Obstetrics                             Anesthesia Physical  Anesthesia Plan  ASA: 3  Anesthesia Plan: MAC   Post-op Pain Management: Minimal or no pain anticipated   Induction: Intravenous  PONV Risk Score and Plan: 1 and Propofol infusion, TIVA and Treatment may vary due to age or medical condition  Airway Management Planned: Natural Airway, Nasal Cannula and Simple Face Mask  Additional Equipment: None  Intra-op Plan:   Post-operative Plan:   Informed Consent: I have reviewed the patients History and Physical, chart, labs and discussed the procedure including the risks, benefits and alternatives for the proposed anesthesia with the patient or authorized representative who has indicated his/her understanding and acceptance.       Plan Discussed with:   Anesthesia Plan Comments:         Anesthesia Quick Evaluation

## 2022-09-15 ENCOUNTER — Encounter (HOSPITAL_COMMUNITY): Payer: Self-pay | Admitting: Gastroenterology

## 2022-09-16 ENCOUNTER — Encounter (INDEPENDENT_AMBULATORY_CARE_PROVIDER_SITE_OTHER): Payer: Self-pay

## 2022-09-19 NOTE — Telephone Encounter (Signed)
Signing encounter, see note 08/30/22

## 2022-09-20 ENCOUNTER — Other Ambulatory Visit: Payer: Medicare Other

## 2022-09-20 ENCOUNTER — Ambulatory Visit: Payer: Medicare Other | Admitting: Oncology

## 2022-10-01 ENCOUNTER — Other Ambulatory Visit: Payer: Self-pay | Admitting: Psychiatry

## 2022-10-01 DIAGNOSIS — F5101 Primary insomnia: Secondary | ICD-10-CM

## 2022-10-07 ENCOUNTER — Inpatient Hospital Stay: Payer: Medicare Other

## 2022-10-07 ENCOUNTER — Inpatient Hospital Stay: Payer: Medicare Other | Attending: Oncology

## 2022-10-07 ENCOUNTER — Encounter: Payer: Self-pay | Admitting: Oncology

## 2022-10-07 ENCOUNTER — Inpatient Hospital Stay (HOSPITAL_BASED_OUTPATIENT_CLINIC_OR_DEPARTMENT_OTHER): Payer: Medicare Other | Admitting: Oncology

## 2022-10-07 VITALS — BP 102/77 | HR 53 | Temp 97.4°F | Resp 18 | Wt 185.7 lb

## 2022-10-07 DIAGNOSIS — Z79899 Other long term (current) drug therapy: Secondary | ICD-10-CM | POA: Insufficient documentation

## 2022-10-07 DIAGNOSIS — Z95828 Presence of other vascular implants and grafts: Secondary | ICD-10-CM

## 2022-10-07 DIAGNOSIS — I2699 Other pulmonary embolism without acute cor pulmonale: Secondary | ICD-10-CM

## 2022-10-07 DIAGNOSIS — Z923 Personal history of irradiation: Secondary | ICD-10-CM | POA: Diagnosis not present

## 2022-10-07 DIAGNOSIS — I82401 Acute embolism and thrombosis of unspecified deep veins of right lower extremity: Secondary | ICD-10-CM | POA: Insufficient documentation

## 2022-10-07 DIAGNOSIS — C160 Malignant neoplasm of cardia: Secondary | ICD-10-CM | POA: Diagnosis not present

## 2022-10-07 DIAGNOSIS — C159 Malignant neoplasm of esophagus, unspecified: Secondary | ICD-10-CM

## 2022-10-07 DIAGNOSIS — C01 Malignant neoplasm of base of tongue: Secondary | ICD-10-CM

## 2022-10-07 DIAGNOSIS — Z87891 Personal history of nicotine dependence: Secondary | ICD-10-CM | POA: Insufficient documentation

## 2022-10-07 LAB — CBC WITH DIFFERENTIAL/PLATELET
Abs Immature Granulocytes: 0.01 10*3/uL (ref 0.00–0.07)
Basophils Absolute: 0 10*3/uL (ref 0.0–0.1)
Basophils Relative: 0 %
Eosinophils Absolute: 0 10*3/uL (ref 0.0–0.5)
Eosinophils Relative: 1 %
HCT: 39 % (ref 39.0–52.0)
Hemoglobin: 13.5 g/dL (ref 13.0–17.0)
Immature Granulocytes: 0 %
Lymphocytes Relative: 11 %
Lymphs Abs: 0.6 10*3/uL — ABNORMAL LOW (ref 0.7–4.0)
MCH: 31.5 pg (ref 26.0–34.0)
MCHC: 34.6 g/dL (ref 30.0–36.0)
MCV: 90.9 fL (ref 80.0–100.0)
Monocytes Absolute: 0.4 10*3/uL (ref 0.1–1.0)
Monocytes Relative: 7 %
Neutro Abs: 4.3 10*3/uL (ref 1.7–7.7)
Neutrophils Relative %: 81 %
Platelets: 183 10*3/uL (ref 150–400)
RBC: 4.29 MIL/uL (ref 4.22–5.81)
RDW: 12.5 % (ref 11.5–15.5)
WBC: 5.2 10*3/uL (ref 4.0–10.5)
nRBC: 0 % (ref 0.0–0.2)

## 2022-10-07 LAB — COMPREHENSIVE METABOLIC PANEL
ALT: 24 U/L (ref 0–44)
AST: 27 U/L (ref 15–41)
Albumin: 3.9 g/dL (ref 3.5–5.0)
Alkaline Phosphatase: 53 U/L (ref 38–126)
Anion gap: 5 (ref 5–15)
BUN: 15 mg/dL (ref 8–23)
CO2: 30 mmol/L (ref 22–32)
Calcium: 8.9 mg/dL (ref 8.9–10.3)
Chloride: 100 mmol/L (ref 98–111)
Creatinine, Ser: 1.04 mg/dL (ref 0.61–1.24)
GFR, Estimated: >60 mL/min (ref >60)
Glucose, Bld: 220 mg/dL — ABNORMAL HIGH (ref 70–99)
Potassium: 3.8 mmol/L (ref 3.5–5.1)
Sodium: 135 mmol/L (ref 135–145)
Total Bilirubin: 0.6 mg/dL (ref 0.3–1.2)
Total Protein: 6.7 g/dL (ref 6.5–8.1)

## 2022-10-07 MED ORDER — HEPARIN SOD (PORK) LOCK FLUSH 100 UNIT/ML IV SOLN
500.0000 [IU] | Freq: Once | INTRAVENOUS | Status: AC
  Administered 2022-10-07: 500 [IU] via INTRAVENOUS
  Filled 2022-10-07: qty 5

## 2022-10-07 MED ORDER — SODIUM CHLORIDE 0.9% FLUSH
10.0000 mL | Freq: Once | INTRAVENOUS | Status: AC
  Administered 2022-10-07: 10 mL via INTRAVENOUS
  Filled 2022-10-07: qty 10

## 2022-10-07 MED ORDER — APIXABAN 2.5 MG PO TABS: 2.5000 mg | ORAL_TABLET | Freq: Two times a day (BID) | ORAL | 5 refills | Status: DC

## 2022-10-07 NOTE — Assessment & Plan Note (Signed)
Bilateral pulmonary emboli and RLE DVT - 01/2021 Continue Eliquis to 2.5 mg twice daily for prophylaxis.

## 2022-10-07 NOTE — Assessment & Plan Note (Addendum)
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 completed concurrent radiation and cisplatin  Surveillance images every 6 months.  I recommend patient to make follow-up appointment with ENT.

## 2022-10-07 NOTE — Assessment & Plan Note (Addendum)
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  10/29/2021, EGD showed no invasive esophagus carcinoma.  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 Previously discussed with [name] surgery Dr.D'Amico who recommends to repeat EGD every 3 months for the first year followed by EGD every 6 months for the second year after treatments If persistent high-grade dysplastic changes., he recommends radiofrequency ablation.  04/08/2022 S/p radiofrequency ablation PET scan showed recurrent mild hypermetabolism within the lower esophagus,corresponding to the same location. Discussed with him that this is likely due to recent ablation.  09/12/2022 repeat EGD showed no suspicious lesions.  Patient underwent additional ablation. Labs are reviewed and discussed with patient.  Patient is clinically doing well. Recommend to repeat imaging surveillance in December 2023.

## 2022-10-07 NOTE — Progress Notes (Signed)
Hematology/Oncology Progress note [phone] [phone]      Clinic Day:  10/07/2022  Referring physician: Derinda Late, MD   Assessment/Plan.   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 completed concurrent radiation and cisplatin  Surveillance images every 6 months.  I recommend patient to make follow-up appointment with ENT.  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  10/29/2021, EGD showed no invasive esophagus carcinoma.  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 Previously discussed with [name] surgery Dr.D'Amico who recommends to repeat EGD every 3 months for the first year followed by EGD every 6 months for the second year after treatments If persistent high-grade dysplastic changes., he recommends radiofrequency ablation.  04/08/2022 S/p radiofrequency ablation PET scan showed recurrent mild hypermetabolism within the lower esophagus,corresponding to the same location. Discussed with him that this is likely due to recent ablation.  09/12/2022 repeat EGD showed no suspicious lesions.  Patient underwent additional ablation. Labs are reviewed and discussed with patient.  Patient is clinically doing well. Recommend to repeat imaging surveillance in December 2023.   Pulmonary embolism, bilateral ([place]) Bilateral pulmonary emboli and RLE DVT - 01/2021 Continue Eliquis to 2.5 mg twice daily for prophylaxis.    Follow up in 6 months, lab MD.  Orders Placed This Encounter  Procedures   NM PET Image  Restag (PS) Skull Base To Thigh    To be scheduled Mid Dec. 2023.    Standing Status:   Future    Standing Expiration Date:   10/07/2023    Order Specific Question:   If indicated for the ordered procedure, I authorize the administration of a radiopharmaceutical per Radiology protocol    Answer:   Yes    Order Specific Question:   Preferred imaging location?    Answer:   [facility]   CBC with Differential/Platelet    Standing Status:   Future    Standing Expiration Date:   10/07/2023   Comprehensive metabolic panel    Standing Status:   Future    Standing Expiration Date:   10/07/2023   CEA    Standing Status:   Future    Standing Expiration Date:   10/07/2023     I discussed the assessment and treatment plan with the patient.  The patient was provided an opportunity to ask questions and all were answered.  The patient agreed with the plan and demonstrated an understanding of the instructions.  The patient was advised to call back if the symptoms worsen or if the condition fails to improve as anticipated.   Earlie Server, MD, PhD [facility] Hematology Oncology 10/07/2022     Chief Complaint: Cordarrel Stiefel. is a 76 y.o. 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 There were no findings of extracervical metastatic disease. There was focal hypermetabolism in the distal esophagus, with possible concurrent soft tissue density lesion. Recommended correlation with endoscopy. There was vague right lower lobe low-level hypermetabolism and ground-glass nodularity, favored minimal infection or aspiration. Incidental findings included aortic atherosclerosis, coronary artery atherosclerosis, emphysema, a tiny hiatal hernia, and prostatomegaly.   11/03/2020 Miscellaneous   11/03/2020 Upper endoscopy on  revealed a malignant esophageal tumor at the gastroesophageal junction. There were esophageal mucosal changes c/w short-segment Barrett's esophagus. There was a 2 cm hiatal hernia. There was gastritis.  Pathology in the esophagus at 36 cm revealed intramucosal adenocarcinoma arising in a background of high grade dysplasia and intestinal metaplasia; pathology at 38 cm revealed adenocarcinoma.   11/15/2020 EUS on  revealed early stage adenocarcinoma arising from Barrett's esophagus.  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      11/16/2020 Initial Diagnosis   Esophageal cancer, stage IB ([place])   04/09/2021 Miscellaneous   04/09/2021, patient was seen by Dr. Elenor Quinones and underwent EGD. Polypoid tumor was seen at the GE junction, consistent with the location of the previously visualized tumor.  With no significant difference compared to photos from other endoscopies.  The proximal esophagus is involved.  Esophagectomy will be applicable. Biopsy was taken at 38 cm.  Dr.  Elenor Quinones has reached out radiation oncology Dr. Baruch Gouty and recommend neoadjuvant chemo and radiation.   05/23/2021 Concurrent Chemotherapy   05/23/2021-07/04/2021 weekly Carboplatin/ taxol  + RT finished 07/09/2021   09/12/2021 Imaging    PET showed no signs of increased hypermetabolic activity in the neck to indicate residual head neck cancer.  No hypermetabolism in the esophagus. No signs of metastasis to the neck, chest, abdomen or pelvis.   10/29/2021 Miscellaneous   EGD showed no evidence of residual esophageal tumor today.  Esophageal mucosal changes consistent with short segment Barrett's esophagus.  Biopsied.  2 cm hiatal hernia.  A single gastric polyp, biopsied.  Pathology showed gastric hyperplastic polyps, Barrett's esophagus, low-grade dysplasia, focally suspicious for high-grade dysplasia.  Evidence of invasive carcinoma was not seen in the submitted biopsies   01/28/2022 Miscellaneous   EGD showed short segment Barrett's disease, distal esophagus at 37 cm biopsy showed low-grade dysplastic and focal high-grade dysplasia.    Miscellaneous     04/08/2022 Miscellaneous   EGD showed no gross lesions in esophagus approximately.  Esophageal mucosal changes secondary to established long segment Barrett's disease distantly.  Treated with radiofrequency ablation. 4 cm hiatal hernia.  Erythematous mucosa in the stomach.  No gross lesions in the duodenal bulb, first portion of the duodenum and in the second portion of duodenum.   Esophageal adenocarcinoma ([place])  04/21/2021 Initial Diagnosis   Esophageal adenocarcinoma ([place])  05/23/2021 - 07/04/2021 Chemotherapy   Patient is on Treatment Plan : ESOPHAGUS Carboplatin/PACLitaxel weekly x 6 weeks with XRT          INTERVAL HISTORY Romelo Sciandra. is a 76 y.o. male who has above history reviewed by me today presents for follow up visit for management of clinical stage I (T1N1Mx) right base of tongue carcinoma and stage IB adenocarcinoma of the  GE junction.  He reports feeling well. No new complaints. Appetite is fair. Weight is stable. Denies any swallowing difficulty, nausea vomiting, epigastric pain.     Past Medical History:  Diagnosis Date   Anxiety    Cancer ([place])    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   Claustrophobia    Diabetes mellitus without complication ([place])    Esophageal adenocarcinoma ([place]) 04/21/2021   GERD (gastroesophageal reflux disease)    Glaucoma    using gtts for this.   Hypercholesterolemia    Hypertension     Past Surgical History:  Procedure Laterality Date   CATARACT EXTRACTION W/PHACO Left 12/03/2017   Procedure: CATARACT EXTRACTION PHACO AND INTRAOCULAR LENS PLACEMENT ([place]) COMPLICATED DIABETIC LEFT;  Surgeon: Leandrew Koyanagi, MD;  Location: [facility];  Service: Ophthalmology;  Laterality: Left;  Diabetic - oral meds   CHOLECYSTECTOMY     COLONOSCOPY  2016   ESOPHAGOGASTRODUODENOSCOPY (EGD) WITH PROPOFOL N/A 04/08/2022   Procedure: ESOPHAGOGASTRODUODENOSCOPY (EGD) WITH PROPOFOL;  Surgeon: Rush Landmark Telford Nab., MD;  Location: [facility];  Service: Gastroenterology;  Laterality: N/A;   ESOPHAGOGASTRODUODENOSCOPY (EGD) WITH PROPOFOL N/A 09/12/2022   Procedure: ESOPHAGOGASTRODUODENOSCOPY (EGD) WITH PROPOFOL;  Surgeon: Rush Landmark Telford Nab., MD;  Location: [facility];  Service: Gastroenterology;  Laterality: N/A;   GI RADIOFREQUENCY ABLATION  04/08/2022   Procedure: GI RADIOFREQUENCY ABLATION;  Surgeon: Rush Landmark Telford Nab., MD;  Location: [facility];  Service: Gastroenterology;;   GI RADIOFREQUENCY ABLATION N/A 09/12/2022   Procedure: GI RADIOFREQUENCY ABLATION;  Surgeon: Irving Copas., MD;  Location: [facility];  Service: Gastroenterology;  Laterality: N/A;   PORTA CATH INSERTION N/A 11/13/2020   Procedure: PORTA CATH INSERTION;  Surgeon: Algernon Huxley, MD;  Location: [facility];  Service: Cardiovascular;  Laterality: N/A;    Family History  Problem Relation Age of Onset   Cancer Mother    Colon cancer Neg Hx    Colon polyps Neg Hx    Esophageal cancer Neg Hx    Rectal cancer Neg Hx    Stomach cancer Neg Hx    Inflammatory bowel disease Neg Hx    Liver disease Neg Hx    Pancreatic cancer Neg Hx     Social History:  reports that he quit smoking about 37 years ago. His smoking use included cigarettes. He has never used smokeless tobacco. He reports that he does not currently use alcohol. He reports that he does not use drugs. He quit smoking cold [place] in 1986. He smoked 2 packs per day for 20 years. 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 He was exposed to [employer]. He worked with a Geologist, engineering for 34 years. The patient is accompanied by his wife today.  Allergies: No Known Allergies  Current Medications: Current Outpatient Medications  Medication Sig Dispense Refill   betamethasone dipropionate 0.05 % lotion Apply topically 2 (two) times daily as needed (Rash). 45 mL 0   betamethasone valerate  lotion (VALISONE) 0.1 % Apply 1 application. topically 2 (two) times daily as needed (dry scalp).     brimonidine-timolol (COMBIGAN) 0.2-0.5 % ophthalmic solution Place 1 drop into the left eye 2 (two) times daily.     calcium carbonate (OS-CAL) 600 MG TABS tablet Take 600 mg by mouth daily.     cholecalciferol (VITAMIN D3) 25 MCG (1000 UNIT) tablet Take 1,000 Units by mouth daily.     citalopram (CELEXA) 10 MG tablet Take 1 tablet (10 mg total) by mouth daily. 90 tablet 1   glucose blood test strip      losartan (COZAAR) 25 MG tablet Take 25 mg by mouth 2 (two) times daily.     Magnesium 400 MG TABS Take 400 mg by mouth daily.     metFORMIN (GLUCOPHAGE-XR) 500 MG 24 hr tablet Take 500 mg by mouth daily with breakfast.     Multiple Vitamin (MULTIVITAMIN) tablet Take 1 tablet by mouth daily.     pantoprazole (PROTONIX) 40 MG tablet TAKE 1  TABLET (40 MG TOTAL) BY MOUTH TWICE A DAY BEFORE MEALS 180 tablet 2   Phenylephrine HCl (4-WAY FAST ACTING NA) Place 1 spray into the nose daily as needed (congestion).     simvastatin (ZOCOR) 40 MG tablet Take 40 mg by mouth daily at 6 PM.     sucralfate (CARAFATE) 1 g tablet Take 1 tablet (1 g total) by mouth 4 (four) times daily. Please show patient how to dissolve the tablets or have him call GI office. 120 tablet 0   traZODone (DESYREL) 50 MG tablet TAKE 1 TABLET BY MOUTH EVERY DAY AT BEDTIME AS NEEDED FOR SLEEP 90 tablet 1   vitamin B-12 (CYANOCOBALAMIN) 1000 MCG tablet Take 1,000 mcg by mouth daily.     apixaban (ELIQUIS) 2.5 MG TABS tablet Take 1 tablet (2.5 mg total) by mouth 2 (two) times daily. 60 tablet 5   GI Cocktail (alum & mag hydroxide, lidocaine, dicyclomine) oral mixture Take 30 mLs by mouth 4 (four) times daily. Swish and swallow (Patient not taking: Reported on 10/07/2022) 450 mL 2   nystatin (MYCOSTATIN) 100000 UNIT/ML suspension Take 5 mLs (500,000 Units total) by mouth 4 (four) times daily. Swish and spit (Patient not taking: Reported on 09/06/2022) 473 mL 0   terbinafine (LAMISIL) 250 MG tablet Take 1 tablet (250 mg total) by mouth daily. (Patient not taking: Reported on 10/07/2022) 30 tablet 0   Current Facility-Administered Medications  Medication Dose Route Frequency Provider Last Rate Last Admin   0.9 %  sodium chloride infusion  500 mL Intravenous Once Pyrtle, Lajuan Lines, MD       Facility-Administered Medications Ordered in Other Visits  Medication Dose Route Frequency Provider Last Rate Last Admin   0.9 % NaCl with KCl 20 mEq/ L  infusion   Intravenous Once Corcoran, Melissa C, MD       heparin lock flush 100 UNIT/ML injection            heparin lock flush 100 unit/mL  500 Units Intravenous Once Earlie Server, MD       magnesium sulfate IVPB 2 g 50 mL  2 g Intravenous Once Corcoran, Melissa C, MD       sodium chloride flush (NS) 0.9 % injection 10 mL  10 mL Intravenous Once  Earlie Server, MD        Review of Systems  Constitutional: Negative.  Negative for chills, fever, malaise/fatigue and weight loss.  HENT:  Negative for congestion,  ear pain and tinnitus.   Eyes: Negative.  Negative for blurred vision and double vision.  Respiratory: Negative.  Negative for cough, sputum production and shortness of breath.   Cardiovascular:  Negative for chest pain and palpitations.  Gastrointestinal: Negative.  Negative for abdominal pain, constipation, diarrhea, nausea and vomiting.  Genitourinary:  Negative for dysuria, frequency and urgency.  Musculoskeletal:  Negative for back pain and falls.  Skin: Negative.  Negative for rash.  Neurological: Negative.  Negative for weakness and headaches.  Endo/Heme/Allergies: Negative.  Does not bruise/bleed easily.  Psychiatric/Behavioral: Negative.  Negative for depression. The patient is not nervous/anxious and does not have insomnia.    Performance status (ECOG): 1  Vitals Blood pressure 102/77, pulse (!) 53, temperature (!) 97.4 F (36.3 C), resp. rate 18, weight 185 lb 11.2 oz (84.2 kg), SpO2 98 %.    Physical Exam Constitutional:      General: He is not in acute distress. HENT:     Head: Normocephalic.     Nose: Nose normal.     Mouth/Throat:     Pharynx: No oropharyngeal exudate.  Eyes:     General: No scleral icterus.    Pupils: Pupils are equal, round, and reactive to light.  Cardiovascular:     Rate and Rhythm: Normal rate.     Heart sounds: No murmur heard. Pulmonary:     Effort: Pulmonary effort is normal. No respiratory distress.     Breath sounds: No wheezing.  Abdominal:     General: Bowel sounds are normal. There is no distension.     Palpations: Abdomen is soft.  Musculoskeletal:        General: Normal range of motion.     Cervical back: Normal range of motion.  Skin:    General: Skin is warm and dry.     Findings: No erythema.  Neurological:     Mental Status: He is alert and oriented to  person, place, and time. Mental status is at baseline.     Cranial Nerves: No cranial nerve deficit.     Motor: No abnormal muscle tone.  Psychiatric:        Mood and Affect: Mood and affect normal.     Labs    Latest Ref Rng & Units 10/07/2022   12:50 PM 04/22/2022   12:30 PM 12/12/2021    1:30 PM  CBC  WBC 4.0 - 10.5 K/uL 5.2  5.6  4.4   Hemoglobin 13.0 - 17.0 g/dL 13.5  14.0  13.5   Hematocrit 39.0 - 52.0 % 39.0  40.6  38.5   Platelets 150 - 400 K/uL 183  180  146       Latest Ref Rng & Units 10/07/2022   12:50 PM 04/22/2022   12:30 PM 12/12/2021    1:30 PM  CMP  Glucose 70 - 99 mg/dL 220  187  156   BUN 8 - 23 mg/dL '15  18  16   '$ Creatinine 0.61 - 1.24 mg/dL 1.04  0.84  0.82   Sodium 135 - 145 mmol/L 135  137  137   Potassium 3.5 - 5.1 mmol/L 3.8  3.7  3.8   Chloride 98 - 111 mmol/L 100  105  101   CO2 22 - 32 mmol/L '30  28  29   '$ Calcium 8.9 - 10.3 mg/dL 8.9  8.6  9.2   Total Protein 6.5 - 8.1 g/dL 6.7  6.9  6.8   Total Bilirubin 0.3 - 1.2 mg/dL  0.6  0.6  0.9   Alkaline Phos 38 - 126 U/L 53  53  52   AST 15 - 41 U/L '27  27  23   '$ ALT 0 - 44 U/L 24  20  21

## 2022-10-08 LAB — CEA: CEA: 0.7 ng/mL (ref 0.0–4.7)

## 2022-10-09 ENCOUNTER — Other Ambulatory Visit: Payer: Self-pay | Admitting: Gastroenterology

## 2022-10-17 ENCOUNTER — Ambulatory Visit (INDEPENDENT_AMBULATORY_CARE_PROVIDER_SITE_OTHER): Payer: Medicare Other | Admitting: Dermatology

## 2022-10-17 DIAGNOSIS — Z79899 Other long term (current) drug therapy: Secondary | ICD-10-CM | POA: Diagnosis not present

## 2022-10-17 DIAGNOSIS — B353 Tinea pedis: Secondary | ICD-10-CM | POA: Diagnosis not present

## 2022-10-17 MED ORDER — TERBINAFINE HCL 250 MG PO TABS: 250.0000 mg | ORAL_TABLET | Freq: Every day | ORAL | 2 refills | Status: DC

## 2022-10-17 NOTE — Progress Notes (Signed)
   Follow-Up Visit   Subjective  Sean Garner. is a 76 y.o. male who presents for the following: Skin Problem (Patient tine pedis using terbinafine 250 mg ) and Tinea Pedis (6 week tinea pedis follow up. Patient has been on terbinafine 250 mg tab po qd for the past 2 months. Patient has seen improvement. ).  The following portions of the chart were reviewed this encounter and updated as appropriate:  Tobacco  Allergies  Meds  Problems  Med Hx  Surg Hx  Fam Hx     Review of Systems: No other skin or systemic complaints except as noted in HPI or Assessment and Plan.  Objective  Well appearing patient in no apparent distress; mood and affect are within normal limits.  A focused examination was performed including right foot. Relevant physical exam findings are noted in the Assessment and Plan.  right foot Scaling and maceration web spaces and over distal and lateral soles.    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 Condition is symptomatic / bothersome to patient. Not to goal.  Has improved   Continue Terbinafine 250 mg take 1 tablet daily for another 3 months Terbinafine Counseling   Terbinafine is an anti-fungal medicine that can be applied to the skin (over the counter) or taken by mouth (prescription) to treat fungal infections. The pill version is often used to treat fungal infections of the nails or scalp. While most people do not have any side effects from taking terbinafine pills, some possible side effects of the medicine can include taste changes, headache, loss of smell, vision changes, nausea, vomiting, or diarrhea.    Rare side effects can include irritation of the liver, allergic reaction, or decrease in blood counts (which may show up as not feeling well or developing an infection). If you are concerned about any of these side effects, please stop the medicine and call your  doctor, or in the case of an emergency such as feeling very unwell, seek immediate medical care.     terbinafine (LAMISIL) 250 MG tablet - right foot Take 1 tablet (250 mg total) by mouth daily.  Return for 1 year tbse and follow up on feet.  IRuthell Rummage, CMA, am acting as scribe for Sarina Ser, MD. Documentation: I have reviewed the above documentation for accuracy and completeness, and I agree with the above.  Sarina Ser, MD

## 2022-10-17 NOTE — Patient Instructions (Signed)
Terbinafine Counseling  Terbinafine is an anti-fungal medicine that can be applied to the skin (over the counter) or taken by mouth (prescription) to treat fungal infections. The pill version is often used to treat fungal infections of the nails or scalp. While most people do not have any side effects from taking terbinafine pills, some possible side effects of the medicine can include taste changes, headache, loss of smell, vision changes, nausea, vomiting, or diarrhea.   Rare side effects can include irritation of the liver, allergic reaction, or decrease in blood counts (which may show up as not feeling well or developing an infection). If you are concerned about any of these side effects, please stop the medicine and call your doctor, or in the case of an emergency such as feeling very unwell, seek immediate medical care.    Due to recent changes in healthcare laws, you may see results of your pathology and/or laboratory studies on MyChart before the doctors have had a chance to review them. We understand that in some cases there may be results that are confusing or concerning to you. Please understand that not all results are received at the same time and often the doctors may need to interpret multiple results in order to provide you with the best plan of care or course of treatment. Therefore, we ask that you please give [place] 2 business days to thoroughly review all your results before contacting the office for clarification. Should we see a critical lab result, you will be contacted sooner.   If You Need Anything After Your Visit  If you have any questions or concerns for your doctor, please call our main line at [phone] and press option 4 to reach your doctor's medical assistant. If no one answers, please leave a voicemail as directed and we will return your call as soon as possible. Messages left after 4 pm will be answered the following business day.   You may also send [place] a message via  [place]. We typically respond to MyChart messages within 1-2 business days.  For prescription refills, please ask your pharmacy to contact our office. Our fax number is [phone].  If you have an urgent issue when the clinic is closed that cannot wait until the next business day, you can page your doctor at the number below.    Please note that while we do our best to be available for urgent issues outside of office hours, we are not available 24/7.   If you have an urgent issue and are unable to reach [place], you may choose to seek medical care at your doctor's office, retail clinic, urgent care center, or emergency room.  If you have a medical emergency, please immediately call 911 or go to the emergency department.  Pager Numbers  - Dr. Nehemiah Massed: [phone]  - Dr. Laurence Ferrari: [phone]  - Dr. Nicole Kindred: [phone]  In the event of inclement weather, please call our main line at [phone] for an update on the status of any delays or closures.  Dermatology Medication Tips: Please keep the boxes that topical medications come in in order to help keep track of the instructions about where and how to use these. Pharmacies typically print the medication instructions only on the boxes and not directly on the medication tubes.   If your medication is too expensive, please contact our office at [phone] option 4 or send [place] a message through [place].   We are unable to tell what your co-pay for medications will be  in advance as this is different depending on your insurance coverage. However, we may be able to find a substitute medication at lower cost or fill out paperwork to get insurance to cover a needed medication.   If a prior authorization is required to get your medication covered by your insurance company, please allow [place] 1-2 business days to complete this process.  Drug prices often vary depending on where the prescription is filled and some pharmacies may offer cheaper  prices.  The website www.goodrx.com contains coupons for medications through different pharmacies. The prices here do not account for what the cost may be with help from insurance (it may be cheaper with your insurance), but the website can give you the price if you did not use any insurance.  - You can print the associated coupon and take it with your prescription to the pharmacy.  - You may also stop by our office during regular business hours and pick up a GoodRx coupon card.  - If you need your prescription sent electronically to a different pharmacy, notify our office through [facility] or by phone at [phone] option 4.     Si Usted Necesita Algo Despus de Su Visita  Tambin puede enviarnos un mensaje a travs de Pharmacist, community. Por lo general respondemos a los mensajes de MyChart en el transcurso de 1 a 2 das hbiles.  Para renovar recetas, por favor pida a su farmacia que se ponga en contacto con nuestra oficina. Harland Dingwall de fax es [place] [phone].  Si tiene un asunto urgente cuando la clnica est cerrada y que no puede esperar hasta el siguiente da hbil, puede llamar/localizar a su doctor(a) al nmero que aparece a continuacin.   Por favor, tenga en cuenta que aunque hacemos todo lo posible para estar disponibles para asuntos urgentes fuera del horario de [place], no estamos disponibles las 24 horas del da, los 7 das de la [place].   Si tiene un problema urgente y no puede comunicarse con nosotros, puede optar por buscar atencin mdica  en el consultorio de su doctor(a), en una clnica privada, en un centro de atencin urgente o en una sala de emergencias.  Si tiene Engineering geologist, por favor llame inmediatamente al 911 o vaya a la sala de emergencias.  Nmeros de bper  - Dr. Nehemiah Massed: [phone]  - Dra. Moye: [phone]  - Dra. Nicole Kindred: [phone]  En caso de inclemencias del [place], por favor llame a Johnsie Kindred principal al [phone]  para una actualizacin sobre el [place] de cualquier retraso o cierre.  Consejos para la medicacin en dermatologa: Por favor, guarde las cajas en las que vienen los medicamentos de uso tpico para ayudarle a seguir las instrucciones sobre dnde y cmo usarlos. Las farmacias generalmente imprimen las instrucciones del medicamento slo en las cajas y no directamente en los tubos del [place].   Si su medicamento es muy caro, por favor, pngase en contacto con Zigmund Daniel llamando al [phone] y presione la opcin 4 o envenos un mensaje a travs de Pharmacist, community.   No podemos decirle cul ser su copago por los medicamentos por adelantado ya que esto es diferente dependiendo de la cobertura de su seguro. Sin embargo, es posible que podamos encontrar un medicamento sustituto a Electrical engineer un formulario para que el seguro cubra el medicamento que se considera necesario.   Si se requiere una autorizacin previa para que su compaa de seguros [place] su medicamento, por favor permtanos de 1 a  2 das hbiles para completar este proceso.  Los precios de los medicamentos varan con frecuencia dependiendo del Environmental consultant de dnde se surte la receta y alguna farmacias pueden ofrecer precios ms baratos.  El sitio web www.goodrx.com tiene cupones para medicamentos de Airline pilot. Los precios aqu no tienen en cuenta lo que podra costar con la ayuda del seguro (puede ser ms barato con su seguro), pero el sitio web puede darle el precio si no utiliz Research scientist (physical sciences).  - Puede imprimir el cupn correspondiente y llevarlo con su receta a la farmacia.  - Tambin puede pasar por nuestra oficina durante el horario de atencin regular y Charity fundraiser una tarjeta de cupones de GoodRx.  - Si necesita que su receta se enve electrnicamente a una farmacia diferente, informe a nuestra oficina a travs de MyChart de [place] o por telfono llamando al [phone] y presione la opcin 4.

## 2022-11-01 ENCOUNTER — Other Ambulatory Visit: Payer: Self-pay | Admitting: Psychiatry

## 2022-11-01 DIAGNOSIS — F411 Generalized anxiety disorder: Secondary | ICD-10-CM

## 2022-11-01 DIAGNOSIS — F5101 Primary insomnia: Secondary | ICD-10-CM

## 2022-11-02 ENCOUNTER — Encounter: Payer: Self-pay | Admitting: Dermatology

## 2022-11-06 ENCOUNTER — Ambulatory Visit: Payer: Non-veteran care

## 2022-12-02 ENCOUNTER — Inpatient Hospital Stay: Payer: Medicare Other

## 2022-12-04 ENCOUNTER — Inpatient Hospital Stay: Payer: Medicare Other | Attending: Oncology

## 2022-12-04 DIAGNOSIS — C160 Malignant neoplasm of cardia: Secondary | ICD-10-CM | POA: Insufficient documentation

## 2022-12-04 DIAGNOSIS — Z452 Encounter for adjustment and management of vascular access device: Secondary | ICD-10-CM | POA: Insufficient documentation

## 2022-12-04 DIAGNOSIS — C01 Malignant neoplasm of base of tongue: Secondary | ICD-10-CM | POA: Diagnosis present

## 2022-12-04 DIAGNOSIS — Z95828 Presence of other vascular implants and grafts: Secondary | ICD-10-CM

## 2022-12-04 MED ORDER — HEPARIN SOD (PORK) LOCK FLUSH 100 UNIT/ML IV SOLN
500.0000 [IU] | Freq: Once | INTRAVENOUS | Status: AC
  Administered 2022-12-04: 500 [IU] via INTRAVENOUS
  Filled 2022-12-04: qty 5

## 2022-12-04 MED ORDER — SODIUM CHLORIDE 0.9% FLUSH
10.0000 mL | Freq: Once | INTRAVENOUS | Status: AC
  Administered 2022-12-04: 10 mL via INTRAVENOUS
  Filled 2022-12-04: qty 10

## 2022-12-04 NOTE — Patient Instructions (Signed)

## 2022-12-16 ENCOUNTER — Ambulatory Visit
Admission: RE | Admit: 2022-12-16 | Discharge: 2022-12-16 | Disposition: A | Discharge status: Home / Self Care | Payer: No Typology Code available for payment source | Source: Ambulatory Visit | Attending: Oncology | Admitting: Oncology

## 2022-12-16 DIAGNOSIS — Z923 Personal history of irradiation: Secondary | ICD-10-CM | POA: Diagnosis not present

## 2022-12-16 DIAGNOSIS — I700 Atherosclerosis of aorta: Secondary | ICD-10-CM | POA: Insufficient documentation

## 2022-12-16 DIAGNOSIS — R911 Solitary pulmonary nodule: Secondary | ICD-10-CM | POA: Diagnosis not present

## 2022-12-16 DIAGNOSIS — K5730 Diverticulosis of large intestine without perforation or abscess without bleeding: Secondary | ICD-10-CM | POA: Insufficient documentation

## 2022-12-16 DIAGNOSIS — Z9221 Personal history of antineoplastic chemotherapy: Secondary | ICD-10-CM | POA: Diagnosis not present

## 2022-12-16 DIAGNOSIS — F40240 Claustrophobia: Secondary | ICD-10-CM | POA: Insufficient documentation

## 2022-12-16 DIAGNOSIS — C159 Malignant neoplasm of esophagus, unspecified: Secondary | ICD-10-CM | POA: Insufficient documentation

## 2022-12-16 DIAGNOSIS — C01 Malignant neoplasm of base of tongue: Secondary | ICD-10-CM

## 2022-12-16 DIAGNOSIS — I2510 Atherosclerotic heart disease of native coronary artery without angina pectoris: Secondary | ICD-10-CM | POA: Diagnosis not present

## 2022-12-16 LAB — GLUCOSE, CAPILLARY: Glucose-Capillary: 184 mg/dL — ABNORMAL HIGH (ref 70–99)

## 2022-12-16 MED ORDER — FLUDEOXYGLUCOSE F - 18 (FDG) INJECTION
9.6000 | Freq: Once | INTRAVENOUS | Status: AC | PRN
  Administered 2022-12-16: 9.64 via INTRAVENOUS

## 2022-12-18 ENCOUNTER — Telehealth: Payer: Self-pay | Admitting: Oncology

## 2022-12-18 NOTE — Telephone Encounter (Signed)
Request call back to [phone]

## 2022-12-18 NOTE — Telephone Encounter (Signed)
Patients wife called and is requesting follow up appointment from PET and ablation. There are no regular open appointments until March 20. Please advise.  Thank you

## 2022-12-18 NOTE — Telephone Encounter (Signed)
Error

## 2022-12-20 ENCOUNTER — Inpatient Hospital Stay: Payer: Medicare Other | Attending: Oncology | Admitting: Oncology

## 2022-12-20 ENCOUNTER — Encounter: Payer: Self-pay | Admitting: Oncology

## 2022-12-20 VITALS — BP 163/71 | HR 51 | Temp 96.2°F | Resp 18 | Wt 188.2 lb

## 2022-12-20 DIAGNOSIS — Z79899 Other long term (current) drug therapy: Secondary | ICD-10-CM | POA: Insufficient documentation

## 2022-12-20 DIAGNOSIS — Z7901 Long term (current) use of anticoagulants: Secondary | ICD-10-CM | POA: Insufficient documentation

## 2022-12-20 DIAGNOSIS — C159 Malignant neoplasm of esophagus, unspecified: Secondary | ICD-10-CM

## 2022-12-20 DIAGNOSIS — C01 Malignant neoplasm of base of tongue: Secondary | ICD-10-CM | POA: Diagnosis not present

## 2022-12-20 DIAGNOSIS — Z86711 Personal history of pulmonary embolism: Secondary | ICD-10-CM | POA: Diagnosis not present

## 2022-12-20 DIAGNOSIS — Z87891 Personal history of nicotine dependence: Secondary | ICD-10-CM | POA: Insufficient documentation

## 2022-12-20 DIAGNOSIS — C160 Malignant neoplasm of cardia: Secondary | ICD-10-CM | POA: Insufficient documentation

## 2022-12-20 DIAGNOSIS — R911 Solitary pulmonary nodule: Secondary | ICD-10-CM

## 2022-12-20 DIAGNOSIS — I2699 Other pulmonary embolism without acute cor pulmonale: Secondary | ICD-10-CM

## 2022-12-20 NOTE — Assessment & Plan Note (Signed)
Bilateral pulmonary emboli and RLE DVT - 01/2021 Continue Eliquis to 2.5 mg twice daily for prophylaxis.

## 2022-12-20 NOTE — Telephone Encounter (Signed)
Pt seeing dr. Tasia Catchings on 2/2

## 2022-12-20 NOTE — Assessment & Plan Note (Signed)
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 completed concurrent radiation and cisplatin  Surveillance images every 6 months. In remission.  Continue to follow-up  with ENT.

## 2022-12-20 NOTE — Assessment & Plan Note (Signed)
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  10/29/2021, EGD showed no invasive esophagus carcinoma.  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 Previously discussed with [name] surgery Dr.D'Amico who recommends to repeat EGD every 3 months for the first year followed by EGD every 6 months for the second year after treatments If persistent high-grade dysplastic changes., he recommends radiofrequency ablation.  04/08/2022 S/p radiofrequency ablation PET scan showed recurrent mild hypermetabolism within the lower esophagus,corresponding to the same location. Discussed with him that this is likely due to recent ablation.  09/12/2022 repeat EGD showed no suspicious lesions.  Patient underwent additional ablation. Labs are reviewed and discussed with patient.  Patient is clinically doing well. Recommend to repeat imaging surveillance in December 2023.

## 2022-12-21 NOTE — Progress Notes (Signed)
Hematology/Oncology Progress note [phone] [phone]       Chief Complaint: Sean Garner. is a 77 y.o. 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   Assessment/Plan.   Cancer Staging  Carcinoma of base of tongue ([place]) Staging form: Pharynx - HPV-Mediated Oropharynx, AJCC 8th Edition - Clinical stage from 10/11/2020: Stage I (cT1, cN1, cM0, p16+) - Signed by Earlie Server, MD on 04/28/2022   Carcinoma of base of tongue ([place]) Stage I right base of tongue carcinoma,+P16   01/11/2021. completed concurrent radiation and cisplatin  Surveillance images every 6 months. In remission.  Continue to follow-up  with ENT.  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  10/29/2021, EGD showed no invasive esophagus carcinoma.  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 Previously discussed with [name] surgery Dr.D'Amico who recommends to repeat EGD every 3 months for the first year followed by EGD every 6 months for the second year after treatments If persistent high-grade dysplastic changes., he recommends radiofrequency ablation.  04/08/2022 S/p radiofrequency ablation 09/12/2022 repeat EGD showed no suspicious lesions.  Patient underwent additional ablation. Labs are reviewed and discussed with patient.  Patient is clinically doing well. PET scan - CR, small lung nodule Recommend to repeat CT chest in 3 months.   Pulmonary embolism, bilateral ([place]) Bilateral pulmonary emboli and RLE DVT - 01/2021 Continue Eliquis to 2.5 mg twice daily for prophylaxis.    Follow up in 3 months, lab MD.  Orders Placed This Encounter  Procedures   CT Chest Wo Contrast    Standing Status:   Future    Standing Expiration Date:   12/20/2023     Order Specific Question:   Preferred imaging location?    Answer:   [place]     I discussed the assessment and treatment plan with the patient.  The patient was provided an opportunity to ask questions and all were answered.  The patient agreed with the plan and demonstrated an understanding of the instructions.  The patient was advised to call back if the symptoms worsen or if the condition fails to improve as anticipated.   Earlie Server, MD, PhD [facility] Hematology Oncology 12/20/2022       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[place])  10/11/2020 Cancer Staging   Staging form: Pharynx - HPV-Mediated Oropharynx, AJCC 8th Edition - Clinical stage from 10/11/2020: Stage I (cT1, cN1, cM0, p16+) - Signed by Earlie Server, MD on 04/28/2022 Stage prefix: Initial diagnosis Stage used in treatment planning: Yes National guidelines used in treatment planning: Yes   10/17/2020 Initial Diagnosis   Carcinoma of base of tongue ([place])   11/20/2020 - 12/27/2020 Chemotherapy   11/20/2020 - 12/27/2020  6 weeks of cisplatin with concurrent radiation        05/23/2021 - 07/04/2021 Chemotherapy   Patient is on Treatment Plan : ESOPHAGUS Carboplatin/PACLitaxel weekly x 6 weeks with XRT       Esophageal cancer, stage IB ([place])  10/25/2020 Imaging   PET scan on 10/25/2020 revealed right tongue base primary with ipsilateral level 2 nodal metastasis. There were no findings of extracervical metastatic disease. There was focal  hypermetabolism in the distal esophagus, with possible concurrent soft tissue density lesion. Recommended correlation with endoscopy. There was vague right lower lobe low-level hypermetabolism and ground-glass nodularity, favored minimal infection or aspiration. Incidental findings included aortic  atherosclerosis, coronary artery atherosclerosis, emphysema, a tiny hiatal hernia, and prostatomegaly.   11/03/2020 Miscellaneous   11/03/2020 Upper endoscopy on  revealed a malignant esophageal tumor at the gastroesophageal junction. There were esophageal mucosal changes c/w short-segment Barrett's esophagus. There was a 2 cm hiatal hernia. There was gastritis.  Pathology in the esophagus at 36 cm revealed intramucosal adenocarcinoma arising in a background of high grade dysplasia and intestinal metaplasia; pathology at 38 cm revealed adenocarcinoma.   11/15/2020 EUS on  revealed early stage adenocarcinoma arising from Barrett's esophagus.  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      11/16/2020 Initial Diagnosis   Esophageal cancer, stage IB ([place])   04/09/2021 Miscellaneous   04/09/2021, patient was seen by Dr. Elenor Quinones and underwent EGD. Polypoid tumor was seen at the GE junction, consistent with the location of the previously visualized tumor.  With no significant difference compared to photos from other endoscopies.  The proximal esophagus is involved.  Esophagectomy will be applicable. Biopsy was taken at 38 cm.  Dr. Elenor Quinones has reached out radiation oncology Dr. Baruch Gouty and recommend neoadjuvant chemo and radiation.   05/23/2021 Concurrent Chemotherapy   05/23/2021-07/04/2021 weekly Carboplatin/ taxol  + RT finished 07/09/2021   09/12/2021 Imaging    PET showed no signs of increased hypermetabolic activity in the neck to indicate residual head neck cancer.  No hypermetabolism in the esophagus. No signs of metastasis to the neck, chest, abdomen or pelvis.   10/29/2021 Miscellaneous   EGD showed no evidence of residual esophageal tumor today.  Esophageal mucosal changes consistent with short segment Barrett's esophagus.  Biopsied.  2 cm hiatal hernia.  A single gastric polyp, biopsied.  Pathology showed gastric hyperplastic polyps, Barrett's esophagus,  low-grade dysplasia, focally suspicious for high-grade dysplasia.  Evidence of invasive carcinoma was not seen in the submitted biopsies   01/28/2022 Miscellaneous   EGD showed short segment Barrett's disease, distal esophagus at 37 cm biopsy showed low-grade dysplastic and focal high-grade dysplasia.   04/08/2022 Miscellaneous   EGD showed no gross lesions in esophagus approximately.  Esophageal mucosal changes secondary to established long segment Barrett's disease distantly.  Treated with radiofrequency ablation. 4 cm hiatal hernia.  Erythematous mucosa in the stomach.  No gross lesions in the duodenal bulb, first portion of the duodenum and in the second portion of duodenum.   09/12/2022 Miscellaneous   Repeat EGD showed no suspicious lesions.  Patient underwent additional ablation.   12/17/2022 Imaging   PET scan showed 1. Interval resolution of the hypermetabolism seen in the distal esophagus on the previous study. Uptake in the distal esophagus today is below blood pool levels. 2. No evidence for hypermetabolic metastatic disease in the neck, chest, abdomen, or pelvis. 3. 4-5 mm posterior right middle lobe pulmonary nodule is new in the interval. Nonspecific on this non breath hold exam, dedicated CT chest without contrast recommended for more definitive characterization. 4. Left colonic diverticulosis without diverticulitis. 5. Small right groin hernia contains only fat. 6.  Aortic Atherosclerosis   Esophageal adenocarcinoma ([place])  04/21/2021 Initial Diagnosis   Esophageal adenocarcinoma ([place])   05/23/2021 - 07/04/2021 Chemotherapy   Patient is on Treatment Plan : ESOPHAGUS Carboplatin/PACLitaxel weekly x 6 weeks with XRT  INTERVAL HISTORY Sean Garner. is a 77 y.o. 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  He  reports feeling well. No new complaints. Appetite is fair. Weight is stable. Denies any swallowing difficulty, nausea vomiting, epigastric pain.     Past Medical History:  Diagnosis Date   Anxiety    Cancer ([place])    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   Claustrophobia    Diabetes mellitus without complication ([place])    Esophageal adenocarcinoma ([place]) 04/21/2021   GERD (gastroesophageal reflux disease)    Glaucoma    using gtts for this.   Hypercholesterolemia    Hypertension     Past Surgical History:  Procedure Laterality Date   CATARACT EXTRACTION W/PHACO Left 12/03/2017   Procedure: CATARACT EXTRACTION PHACO AND INTRAOCULAR LENS PLACEMENT ([place]) COMPLICATED DIABETIC LEFT;  Surgeon: Leandrew Koyanagi, MD;  Location: [facility];  Service: Ophthalmology;  Laterality: Left;  Diabetic - oral meds   CHOLECYSTECTOMY     COLONOSCOPY  2016   ESOPHAGOGASTRODUODENOSCOPY (EGD) WITH PROPOFOL N/A 04/08/2022   Procedure: ESOPHAGOGASTRODUODENOSCOPY (EGD) WITH PROPOFOL;  Surgeon: Rush Landmark Telford Nab., MD;  Location: [facility];  Service: Gastroenterology;  Laterality: N/A;   ESOPHAGOGASTRODUODENOSCOPY (EGD) WITH PROPOFOL N/A 09/12/2022   Procedure: ESOPHAGOGASTRODUODENOSCOPY (EGD) WITH PROPOFOL;  Surgeon: Rush Landmark Telford Nab., MD;  Location: [facility];  Service: Gastroenterology;  Laterality: N/A;   GI RADIOFREQUENCY ABLATION  04/08/2022   Procedure: GI RADIOFREQUENCY ABLATION;  Surgeon: Rush Landmark Telford Nab., MD;  Location: [facility];  Service: Gastroenterology;;   GI RADIOFREQUENCY ABLATION N/A 09/12/2022   Procedure: GI RADIOFREQUENCY ABLATION;  Surgeon: Irving Copas., MD;  Location: [facility];  Service: Gastroenterology;  Laterality: N/A;   PORTA CATH INSERTION N/A 11/13/2020   Procedure: PORTA CATH INSERTION;  Surgeon: Algernon Huxley, MD;  Location: [facility];  Service:  Cardiovascular;  Laterality: N/A;    Family History  Problem Relation Age of Onset   Cancer Mother    Colon cancer Neg Hx    Colon polyps Neg Hx    Esophageal cancer Neg Hx    Rectal cancer Neg Hx    Stomach cancer Neg Hx    Inflammatory bowel disease Neg Hx    Liver disease Neg Hx    Pancreatic cancer Neg Hx     Social History:  reports that he quit smoking about 38 years ago. His smoking use included cigarettes. He has never used smokeless tobacco. He reports that he does not currently use alcohol. He reports that he does not use drugs. He quit smoking cold [place] in 1986. He smoked 2 packs per day for 20 years. 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 He was exposed to [employer]. He worked with a Geologist, engineering for 34 years. The patient is accompanied by his wife today.  Allergies: No Known Allergies  Current Medications: Current Outpatient Medications  Medication Sig Dispense Refill   apixaban (ELIQUIS) 2.5 MG TABS tablet Take 1 tablet (2.5 mg total) by mouth 2 (two) times daily. 60 tablet 5   brimonidine-timolol (COMBIGAN) 0.2-0.5 % ophthalmic solution Place 1 drop into the left eye 2 (two) times daily.     calcium carbonate (OS-CAL) 600 MG TABS  tablet Take 600 mg by mouth daily.     cholecalciferol (VITAMIN D3) 25 MCG (1000 UNIT) tablet Take 1,000 Units by mouth daily.     citalopram (CELEXA) 10 MG tablet Take 1 tablet (10 mg total) by mouth daily. 90 tablet 1   glucose blood test strip      losartan (COZAAR) 25 MG tablet Take 25 mg by mouth 2 (two) times daily.     Magnesium 400 MG TABS Take 400 mg by mouth daily.     metFORMIN (GLUCOPHAGE-XR) 500 MG 24 hr tablet Take 500 mg by mouth daily with breakfast.     Multiple Vitamin (MULTIVITAMIN) tablet Take 1 tablet by mouth daily.     pantoprazole (PROTONIX) 40 MG tablet TAKE 1 TABLET (40 MG TOTAL) BY MOUTH TWICE A DAY BEFORE MEALS 180 tablet 2   simvastatin (ZOCOR) 40 MG tablet Take 40 mg by mouth daily at 6  PM.     terbinafine (LAMISIL) 250 MG tablet Take 1 tablet (250 mg total) by mouth daily. 30 tablet 2   traZODone (DESYREL) 50 MG tablet TAKE 1 TABLET BY MOUTH EVERY DAY AT BEDTIME AS NEEDED FOR SLEEP 90 tablet 1   vitamin B-12 (CYANOCOBALAMIN) 1000 MCG tablet Take 1,000 mcg by mouth daily.     betamethasone dipropionate 0.05 % lotion Apply topically 2 (two) times daily as needed (Rash). (Patient not taking: Reported on 12/20/2022) 45 mL 0   betamethasone valerate lotion (VALISONE) 0.1 % Apply 1 application. topically 2 (two) times daily as needed (dry scalp). (Patient not taking: Reported on 12/20/2022)     GI Cocktail (alum & mag hydroxide, lidocaine, dicyclomine) oral mixture Take 30 mLs by mouth 4 (four) times daily. Swish and swallow (Patient not taking: Reported on 12/20/2022) 450 mL 2   nystatin (MYCOSTATIN) 100000 UNIT/ML suspension Take 5 mLs (500,000 Units total) by mouth 4 (four) times daily. Swish and spit (Patient not taking: Reported on 12/20/2022) 473 mL 0   Phenylephrine HCl (4-WAY FAST ACTING NA) Place 1 spray into the nose daily as needed (congestion). (Patient not taking: Reported on 12/20/2022)     sucralfate (CARAFATE) 1 g tablet TAKE 1 TABLET (1 G TOTAL) BY MOUTH 4 (FOUR) TIMES DAILY. PLEASE SHOW PATIENT HOW TO DISSOLVE THE TABLETS OR HAVE HIM CALL GI OFFICE. (Patient not taking: Reported on 12/20/2022) 120 tablet 0   Current Facility-Administered Medications  Medication Dose Route Frequency Provider Last Rate Last Admin   0.9 %  sodium chloride infusion  500 mL Intravenous Once Pyrtle, Lajuan Lines, MD       Facility-Administered Medications Ordered in Other Visits  Medication Dose Route Frequency Provider Last Rate Last Admin   0.9 % NaCl with KCl 20 mEq/ L  infusion   Intravenous Once Corcoran, Melissa C, MD       heparin lock flush 100 UNIT/ML injection            heparin lock flush 100 unit/mL  500 Units Intravenous Once Earlie Server, MD       magnesium sulfate IVPB 2 g 50 mL  2 g Intravenous  Once Corcoran, Melissa C, MD       sodium chloride flush (NS) 0.9 % injection 10 mL  10 mL Intravenous Once Earlie Server, MD        Review of Systems  Constitutional: Negative.  Negative for chills, fever, malaise/fatigue and weight loss.  HENT:  Negative for congestion, ear pain and tinnitus.   Eyes: Negative.  Negative for  blurred vision and double vision.  Respiratory: Negative.  Negative for cough, sputum production and shortness of breath.   Cardiovascular:  Negative for chest pain and palpitations.  Gastrointestinal: Negative.  Negative for abdominal pain, constipation, diarrhea, nausea and vomiting.  Genitourinary:  Negative for dysuria, frequency and urgency.  Musculoskeletal:  Negative for back pain and falls.  Skin: Negative.  Negative for rash.  Neurological: Negative.  Negative for weakness and headaches.  Endo/Heme/Allergies: Negative.  Does not bruise/bleed easily.  Psychiatric/Behavioral: Negative.  Negative for depression. The patient is not nervous/anxious and does not have insomnia.    Performance status (ECOG): 1  Vitals Blood pressure (!) 163/71, pulse (!) 51, temperature (!) 96.2 F (35.7 C), temperature source Tympanic, resp. rate 18, weight 188 lb 3.2 oz (85.4 kg), SpO2 98 %.    Physical Exam Constitutional:      General: He is not in acute distress. HENT:     Head: Normocephalic.     Nose: Nose normal.     Mouth/Throat:     Pharynx: No oropharyngeal exudate.  Eyes:     General: No scleral icterus.    Pupils: Pupils are equal, round, and reactive to light.  Cardiovascular:     Rate and Rhythm: Normal rate.     Heart sounds: No murmur heard. Pulmonary:     Effort: Pulmonary effort is normal. No respiratory distress.     Breath sounds: No wheezing.  Abdominal:     General: Bowel sounds are normal. There is no distension.     Palpations: Abdomen is soft.  Musculoskeletal:        General: Normal range of motion.     Cervical back: Normal range of motion.   Skin:    General: Skin is warm and dry.     Findings: No erythema.  Neurological:     Mental Status: He is alert and oriented to person, place, and time. Mental status is at baseline.     Cranial Nerves: No cranial nerve deficit.     Motor: No abnormal muscle tone.  Psychiatric:        Mood and Affect: Mood and affect normal.     Labs    Latest Ref Rng & Units 10/07/2022   12:50 PM 04/22/2022   12:30 PM 12/12/2021    1:30 PM  CBC  WBC 4.0 - 10.5 K/uL 5.2  5.6  4.4   Hemoglobin 13.0 - 17.0 g/dL 13.5  14.0  13.5   Hematocrit 39.0 - 52.0 % 39.0  40.6  38.5   Platelets 150 - 400 K/uL 183  180  146       Latest Ref Rng & Units 10/07/2022   12:50 PM 04/22/2022   12:30 PM 12/12/2021    1:30 PM  CMP  Glucose 70 - 99 mg/dL 220  187  156   BUN 8 - 23 mg/dL '15  18  16   '$ Creatinine 0.61 - 1.24 mg/dL 1.04  0.84  0.82   Sodium 135 - 145 mmol/L 135  137  137   Potassium 3.5 - 5.1 mmol/L 3.8  3.7  3.8   Chloride 98 - 111 mmol/L 100  105  101   CO2 22 - 32 mmol/L '30  28  29   '$ Calcium 8.9 - 10.3 mg/dL 8.9  8.6  9.2   Total Protein 6.5 - 8.1 g/dL 6.7  6.9  6.8   Total Bilirubin 0.3 - 1.2 mg/dL 0.6  0.6  0.9  Alkaline Phos 38 - 126 U/L 53  53  52   AST 15 - 41 U/L '27  27  23   '$ ALT 0 - 44 U/L 24  20  21

## 2022-12-27 ENCOUNTER — Other Ambulatory Visit: Payer: Self-pay | Admitting: Psychiatry

## 2022-12-27 DIAGNOSIS — F5101 Primary insomnia: Secondary | ICD-10-CM

## 2022-12-27 DIAGNOSIS — F411 Generalized anxiety disorder: Secondary | ICD-10-CM

## 2023-01-02 ENCOUNTER — Ambulatory Visit: Payer: Medicare Other | Attending: Radiation Oncology | Admitting: Radiation Oncology

## 2023-01-23 ENCOUNTER — Ambulatory Visit
Admission: RE | Admit: 2023-01-23 | Discharge: 2023-01-23 | Disposition: A | Discharge status: Home / Self Care | Payer: Medicare Other | Source: Ambulatory Visit | Attending: Radiation Oncology | Admitting: Radiation Oncology

## 2023-01-23 ENCOUNTER — Encounter: Payer: Self-pay | Admitting: Radiation Oncology

## 2023-01-23 ENCOUNTER — Inpatient Hospital Stay: Payer: Medicare Other | Attending: Oncology

## 2023-01-23 VITALS — BP 125/71 | HR 57 | Temp 98.6°F | Resp 20 | Ht 70.0 in | Wt 190.0 lb

## 2023-01-23 DIAGNOSIS — Z452 Encounter for adjustment and management of vascular access device: Secondary | ICD-10-CM | POA: Insufficient documentation

## 2023-01-23 DIAGNOSIS — C01 Malignant neoplasm of base of tongue: Secondary | ICD-10-CM | POA: Insufficient documentation

## 2023-01-23 DIAGNOSIS — C160 Malignant neoplasm of cardia: Secondary | ICD-10-CM | POA: Diagnosis present

## 2023-01-23 DIAGNOSIS — Z95828 Presence of other vascular implants and grafts: Secondary | ICD-10-CM

## 2023-01-23 MED ORDER — HEPARIN SOD (PORK) LOCK FLUSH 100 UNIT/ML IV SOLN
500.0000 [IU] | Freq: Once | INTRAVENOUS | Status: AC
  Administered 2023-01-23: 500 [IU] via INTRAVENOUS
  Filled 2023-01-23: qty 5

## 2023-01-23 MED ORDER — SODIUM CHLORIDE 0.9% FLUSH
10.0000 mL | Freq: Once | INTRAVENOUS | Status: AC
  Administered 2023-01-23: 10 mL via INTRAVENOUS
  Filled 2023-01-23: qty 10

## 2023-01-23 NOTE — Progress Notes (Signed)
Radiation Oncology Follow up Note  Name: Sean Garner.   Date:   01/23/2023 MRN:  [id number] DOB: June 07, 1946    This 77 y.o. 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  REFERRING PROVIDER: BDerinda Late MD  HPI: Patient is a 77year old male who is completed radiation therapy for both esophageal cancer adenocarcinoma the GE junction as well as base of tongue cancer stage III squamous cell carcinoma.  Seen today in routine follow-up he is doing well.  He specifically denies any dysphagia cough hemoptysis or chest tightness..  He had a PET CT scan back in January showing interval resolution of hypermetabolic activity in the distal esophagus.  No evidence of hypermetabolic disease in the head and neck chest abdomen or pelvis.  He did have a 4 to 5 mm posterior right middle lobe pulmonary nodule which was new which he has repeat imaging studies in May.  COMPLICATIONS OF TREATMENT: none  FOLLOW UP COMPLIANCE: keeps appointments   PHYSICAL EXAM:  BP 125/71 (BP Location: Left Arm, Patient Position: Sitting, Cuff Size: Normal)   Pulse (!) 57   Temp 98.6 F (37 C) (Tympanic)   Resp 20   Ht '5\' 10"'$  (1.778 m)   Wt 190 lb (86.2 kg)   BMI 27.26 kg/m  Neck is clear with evidence of cervical or supraclavicular adenopathy.  Well-developed well-nourished patient in NAD. HEENT reveals PERLA, EOMI, discs not visualized.  Oral cavity is clear. No oral mucosal lesions are identified. Neck is clear without evidence of cervical or supraclavicular adenopathy. Lungs are clear to A&P. Cardiac examination is essentially unremarkable with regular rate and rhythm without murmur rub or thrill. Abdomen is benign with no organomegaly or masses noted. Motor sensory and DTR levels are equal and symmetric in  the upper and lower extremities. Cranial nerves II through XII are grossly intact. Proprioception is intact. No peripheral adenopathy or edema is identified. No motor or sensory levels are noted. Crude visual fields are within normal range.  RADIOLOGY RESULTS: PET CT scan reviewed compatible with above-stated findings.  PLAN: Present time patient is doing well with no evidence of disease now out over 2 years for head and neck concurrent chemoradiation as well as radiation for esophageal cancer.  And pleased with his overall progress.  He continues to do well.  Will keep an eye on this new pulmonary nodule should have progress I have suggested we may discuss SBRT treatment.  I have asked to see him back in 6 months for follow-up.  Patient is to call sooner with any concerns.  I would like to take this opportunity to thank you for allowing me to participate in the care of your patient..Noreene Filbert MD

## 2023-01-27 ENCOUNTER — Inpatient Hospital Stay: Payer: Medicare Other

## 2023-02-24 ENCOUNTER — Telehealth: Payer: Self-pay

## 2023-02-24 ENCOUNTER — Other Ambulatory Visit: Payer: Self-pay | Admitting: Dermatology

## 2023-02-24 DIAGNOSIS — B353 Tinea pedis: Secondary | ICD-10-CM

## 2023-02-24 NOTE — Telephone Encounter (Signed)
I spoke with patient and he did take 3 months of Lamisil, but then a few weeks ago he said the pharmacy called and said he had more ready for pick up. I advised him that since he took the full three months of medication he can go ahead and stop, and we will recheck condition at follow up.

## 2023-03-19 ENCOUNTER — Inpatient Hospital Stay: Payer: Medicare Other | Attending: Oncology

## 2023-03-19 DIAGNOSIS — Z87891 Personal history of nicotine dependence: Secondary | ICD-10-CM | POA: Insufficient documentation

## 2023-03-19 DIAGNOSIS — C160 Malignant neoplasm of cardia: Secondary | ICD-10-CM | POA: Insufficient documentation

## 2023-03-19 DIAGNOSIS — C01 Malignant neoplasm of base of tongue: Secondary | ICD-10-CM | POA: Insufficient documentation

## 2023-03-19 DIAGNOSIS — Z86711 Personal history of pulmonary embolism: Secondary | ICD-10-CM | POA: Insufficient documentation

## 2023-03-19 DIAGNOSIS — Z79899 Other long term (current) drug therapy: Secondary | ICD-10-CM | POA: Insufficient documentation

## 2023-03-19 DIAGNOSIS — Z923 Personal history of irradiation: Secondary | ICD-10-CM | POA: Insufficient documentation

## 2023-03-19 DIAGNOSIS — Z7901 Long term (current) use of anticoagulants: Secondary | ICD-10-CM | POA: Insufficient documentation

## 2023-03-20 ENCOUNTER — Ambulatory Visit
Admission: RE | Admit: 2023-03-20 | Discharge: 2023-03-20 | Disposition: A | Discharge status: Home / Self Care | Payer: Medicare Other | Source: Ambulatory Visit | Attending: Oncology | Admitting: Oncology

## 2023-03-20 DIAGNOSIS — R911 Solitary pulmonary nodule: Secondary | ICD-10-CM | POA: Insufficient documentation

## 2023-03-24 ENCOUNTER — Inpatient Hospital Stay: Payer: Medicare Other

## 2023-03-24 ENCOUNTER — Telehealth: Payer: Self-pay | Admitting: *Deleted

## 2023-03-24 NOTE — Telephone Encounter (Signed)
Patient called asking about CT Results which is not read yet. I called radiology and they are going to make it stat, I informed patient that it is not read and should be read today. He thanked me for letting him know

## 2023-03-24 NOTE — Telephone Encounter (Signed)
Patient called asking for someone to call him to go over his results His next appointment is 04/07/23  IMPRESSION: 1. 4-5 mm posterior right middle lobe pulmonary nodule identified as new on the prior study has resolved in the interval. 2. Interval development of 3 new nodular ground-glass opacities in the left upper lobe measuring up to 1.6 x 0.7 cm. These are likely infectious/inflammatory. Non-contrast chest CT at 3-6 months is recommended. If the ground glass opacities persist, subsequent management will be based upon the most suspicious findings. This recommendation follows the consensus statement: Guidelines for Management of Incidental Pulmonary Nodules Detected on CT Images: From the Fleischner Society 2017; Radiology 2017; 281: 228-243. 3. Tiny hiatal hernia. 4.  Aortic Atherosclerosis (ICD10-I70.0).     Electronically Signed   By: Kennith Center M.D.   On: 03/24/2023 11:28

## 2023-03-25 ENCOUNTER — Encounter: Payer: Self-pay | Admitting: Oncology

## 2023-03-25 ENCOUNTER — Inpatient Hospital Stay: Payer: Medicare Other

## 2023-03-25 ENCOUNTER — Inpatient Hospital Stay (HOSPITAL_BASED_OUTPATIENT_CLINIC_OR_DEPARTMENT_OTHER): Payer: Medicare Other | Admitting: Oncology

## 2023-03-25 VITALS — BP 145/74 | HR 56 | Temp 96.9°F | Resp 18 | Wt 196.3 lb

## 2023-03-25 DIAGNOSIS — C01 Malignant neoplasm of base of tongue: Secondary | ICD-10-CM

## 2023-03-25 DIAGNOSIS — C159 Malignant neoplasm of esophagus, unspecified: Secondary | ICD-10-CM

## 2023-03-25 DIAGNOSIS — I2699 Other pulmonary embolism without acute cor pulmonale: Secondary | ICD-10-CM

## 2023-03-25 DIAGNOSIS — R918 Other nonspecific abnormal finding of lung field: Secondary | ICD-10-CM

## 2023-03-25 DIAGNOSIS — Z95828 Presence of other vascular implants and grafts: Secondary | ICD-10-CM

## 2023-03-25 DIAGNOSIS — C160 Malignant neoplasm of cardia: Secondary | ICD-10-CM | POA: Diagnosis present

## 2023-03-25 DIAGNOSIS — Z86711 Personal history of pulmonary embolism: Secondary | ICD-10-CM | POA: Diagnosis not present

## 2023-03-25 DIAGNOSIS — R911 Solitary pulmonary nodule: Secondary | ICD-10-CM

## 2023-03-25 DIAGNOSIS — Z7901 Long term (current) use of anticoagulants: Secondary | ICD-10-CM | POA: Diagnosis not present

## 2023-03-25 DIAGNOSIS — Z923 Personal history of irradiation: Secondary | ICD-10-CM | POA: Diagnosis not present

## 2023-03-25 DIAGNOSIS — Z87891 Personal history of nicotine dependence: Secondary | ICD-10-CM | POA: Diagnosis not present

## 2023-03-25 DIAGNOSIS — Z79899 Other long term (current) drug therapy: Secondary | ICD-10-CM | POA: Diagnosis not present

## 2023-03-25 LAB — CBC WITH DIFFERENTIAL/PLATELET
Abs Immature Granulocytes: 0.02 10*3/uL (ref 0.00–0.07)
Basophils Absolute: 0 10*3/uL (ref 0.0–0.1)
Basophils Relative: 0 %
Eosinophils Absolute: 0.1 10*3/uL (ref 0.0–0.5)
Eosinophils Relative: 1 %
HCT: 41.1 % (ref 39.0–52.0)
Hemoglobin: 14.5 g/dL (ref 13.0–17.0)
Immature Granulocytes: 0 %
Lymphocytes Relative: 12 %
Lymphs Abs: 0.6 10*3/uL — ABNORMAL LOW (ref 0.7–4.0)
MCH: 32.4 pg (ref 26.0–34.0)
MCHC: 35.3 g/dL (ref 30.0–36.0)
MCV: 91.7 fL (ref 80.0–100.0)
Monocytes Absolute: 0.3 10*3/uL (ref 0.1–1.0)
Monocytes Relative: 7 %
Neutro Abs: 3.9 10*3/uL (ref 1.7–7.7)
Neutrophils Relative %: 80 %
Platelets: 155 10*3/uL (ref 150–400)
RBC: 4.48 MIL/uL (ref 4.22–5.81)
RDW: 12.8 % (ref 11.5–15.5)
WBC: 4.9 10*3/uL (ref 4.0–10.5)
nRBC: 0 % (ref 0.0–0.2)

## 2023-03-25 LAB — COMPREHENSIVE METABOLIC PANEL
ALT: 19 U/L (ref 0–44)
AST: 21 U/L (ref 15–41)
Albumin: 4 g/dL (ref 3.5–5.0)
Alkaline Phosphatase: 51 U/L (ref 38–126)
Anion gap: 8 (ref 5–15)
BUN: 15 mg/dL (ref 8–23)
CO2: 26 mmol/L (ref 22–32)
Calcium: 8.8 mg/dL — ABNORMAL LOW (ref 8.9–10.3)
Chloride: 101 mmol/L (ref 98–111)
Creatinine, Ser: 1.09 mg/dL (ref 0.61–1.24)
GFR, Estimated: >60 mL/min (ref >60)
Glucose, Bld: 323 mg/dL — ABNORMAL HIGH (ref 70–99)
Potassium: 3.8 mmol/L (ref 3.5–5.1)
Sodium: 135 mmol/L (ref 135–145)
Total Bilirubin: 0.9 mg/dL (ref 0.3–1.2)
Total Protein: 6.5 g/dL (ref 6.5–8.1)

## 2023-03-25 MED ORDER — SODIUM CHLORIDE 0.9% FLUSH
10.0000 mL | Freq: Once | INTRAVENOUS | Status: AC
  Administered 2023-03-25: 10 mL via INTRAVENOUS
  Filled 2023-03-25: qty 10

## 2023-03-25 MED ORDER — HEPARIN SOD (PORK) LOCK FLUSH 100 UNIT/ML IV SOLN
500.0000 [IU] | Freq: Once | INTRAVENOUS | Status: AC
  Administered 2023-03-25: 500 [IU] via INTRAVENOUS
  Filled 2023-03-25: qty 5

## 2023-03-25 NOTE — Telephone Encounter (Signed)
Scheduling will check with pt to see if he can come today to review results

## 2023-03-25 NOTE — Assessment & Plan Note (Signed)
Continue port flush as scheduled.

## 2023-03-25 NOTE — Assessment & Plan Note (Addendum)
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  10/29/2021, EGD showed no invasive esophagus carcinoma.  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 Previously discussed with [name] surgery Dr.D'Amico who recommends to repeat EGD every 3 months for the first year followed by EGD every 6 months for the second year after treatments If persistent high-grade dysplastic changes., he recommends radiofrequency ablation.  04/08/2022 S/p radiofrequency ablation Continue follow up with GI and EGD surveillance and ablation if needed.  CT images were reviewed and discussed with patient.  Repeat CT in 2-3 months.

## 2023-03-25 NOTE — Progress Notes (Signed)
Hematology/Oncology Progress note [phone] [phone]       Chief Complaint: Sean Garner. is a 77 y.o. 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   Assessment/Plan.   Cancer Staging  Carcinoma of base of tongue (HCC) Staging form: Pharynx - HPV-Mediated Oropharynx, AJCC 8th Edition - Clinical stage from 10/11/2020: Stage I (cT1, cN1, cM0, p16+) - Signed by Rickard Patience, MD on 04/28/2022   Carcinoma of base of tongue (HCC) Stage I right base of tongue carcinoma,+P16   01/11/2021. completed concurrent radiation and cisplatin  Continue to follow-up  with ENT.  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  10/29/2021, EGD showed no invasive esophagus carcinoma.  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 Previously discussed with [name] surgery Dr.D'Amico who recommends to repeat EGD every 3 months for the first year followed by EGD every 6 months for the second year after treatments If persistent high-grade dysplastic changes., he recommends radiofrequency ablation.  04/08/2022 S/p radiofrequency ablation Continue follow up with GI and EGD surveillance and ablation if needed.  CT images were reviewed and discussed with patient.  Repeat CT in 2-3 months.    Pulmonary embolism, bilateral (HCC) Bilateral pulmonary emboli and RLE DVT - 01/2021 Continue Eliquis to 2.5 mg twice daily for prophylaxis.   Lung nodules CT was reviewed and discussed with patient.  Probably infectious/inflammation.  Repeat CT in 2-3 months.   Port-A-Cath in place Continue port flush as scheduled.     Orders Placed This Encounter  Procedures   CT Chest Wo Contrast    Standing Status:   Future    Standing Expiration Date:    03/24/2024    Order Specific Question:   Preferred imaging location?    Answer:   [facility]   CBC with Differential (Cancer Center Only)    Standing Status:   Future    Standing Expiration Date:   03/24/2024   CMP (Cancer Center only)    Standing Status:   Future    Standing Expiration Date:   03/24/2024   CEA    Standing Status:   Future    Standing Expiration Date:   03/24/2024     I discussed the assessment and treatment plan with the patient.  The patient was provided an opportunity to ask questions and all were answered.  The patient agreed with the plan and demonstrated an understanding of the instructions.  The patient was advised to call back if the symptoms worsen or if the condition fails to improve as anticipated.   Rickard Patience, MD, PhD [facility] Hematology Oncology 03/25/2023       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HCC)  10/11/2020 Cancer Staging   Staging form: Pharynx - HPV-Mediated Oropharynx, AJCC 8th Edition - Clinical stage from 10/11/2020: Stage I (cT1, cN1, cM0, p16+) - Signed by Rickard Patience, MD on 04/28/2022 Stage prefix: Initial diagnosis Stage used in treatment planning: Yes National guidelines used in treatment planning: Yes   10/17/2020 Initial Diagnosis   Carcinoma of base of tongue (HCC)   11/20/2020 - 12/27/2020 Chemotherapy   11/20/2020 - 12/27/2020  6 weeks of cisplatin with  concurrent radiation        05/23/2021 - 07/04/2021 Chemotherapy   Patient is on Treatment Plan : ESOPHAGUS Carboplatin/PACLitaxel weekly x 6 weeks with XRT       Esophageal cancer, stage IB (HCC)  10/25/2020 Imaging   PET scan on 10/25/2020 revealed right tongue base primary with ipsilateral level 2 nodal metastasis. There were no findings of extracervical metastatic disease. There was focal  hypermetabolism in the distal esophagus, with possible concurrent soft tissue density lesion. Recommended correlation with endoscopy. There was vague right lower lobe low-level hypermetabolism and ground-glass nodularity, favored minimal infection or aspiration. Incidental findings included aortic atherosclerosis, coronary artery atherosclerosis, emphysema, a tiny hiatal hernia, and prostatomegaly.   11/03/2020 Miscellaneous   11/03/2020 Upper endoscopy on  revealed a malignant esophageal tumor at the gastroesophageal junction. There were esophageal mucosal changes c/w short-segment Barrett's esophagus. There was a 2 cm hiatal hernia. There was gastritis.  Pathology in the esophagus at 36 cm revealed intramucosal adenocarcinoma arising in a background of high grade dysplasia and intestinal metaplasia; pathology at 38 cm revealed adenocarcinoma.   11/15/2020 EUS on  revealed early stage adenocarcinoma arising from Barrett's esophagus.  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      11/16/2020 Initial Diagnosis   Esophageal cancer, stage IB (HCC)   04/09/2021 Miscellaneous   04/09/2021, patient was seen by Dr. Ewing Schlein and underwent EGD. Polypoid tumor was seen at the GE junction, consistent with the location of the previously visualized tumor.  With no significant difference compared to photos from other endoscopies.  The proximal esophagus is involved.  Esophagectomy will be applicable. Biopsy was taken at 38 cm.  Dr. Ewing Schlein has reached out radiation oncology Dr. Rushie Chestnut and recommend neoadjuvant chemo and radiation.   05/23/2021 Concurrent Chemotherapy   05/23/2021-07/04/2021 weekly Carboplatin/ taxol  + RT finished 07/09/2021   09/12/2021 Imaging    PET showed no signs of increased hypermetabolic activity in the neck to indicate residual head neck cancer.  No hypermetabolism in the esophagus. No signs of metastasis to the neck, chest, abdomen or pelvis.    10/29/2021 Miscellaneous   EGD showed no evidence of residual esophageal tumor today.  Esophageal mucosal changes consistent with short segment Barrett's esophagus.  Biopsied.  2 cm hiatal hernia.  A single gastric polyp, biopsied.  Pathology showed gastric hyperplastic polyps, Barrett's esophagus, low-grade dysplasia, focally suspicious for high-grade dysplasia.  Evidence of invasive carcinoma was not seen in the submitted biopsies   01/28/2022 Miscellaneous   EGD showed short segment Barrett's disease, distal esophagus at 37 cm biopsy showed low-grade dysplastic and focal high-grade dysplasia.   04/08/2022 Miscellaneous   EGD showed no gross lesions in esophagus approximately.  Esophageal mucosal changes secondary to established long segment Barrett's disease distantly.  Treated with radiofrequency ablation. 4 cm hiatal hernia.  Erythematous mucosa in the stomach.  No gross lesions in the duodenal bulb, first portion of the duodenum and in the second portion of duodenum.   09/12/2022 Miscellaneous   Repeat EGD showed no suspicious lesions.  Patient underwent additional ablation.   12/17/2022 Imaging   PET scan showed 1. Interval resolution of the hypermetabolism seen in the distal esophagus on the previous study. Uptake in the distal esophagus today is below blood pool levels. 2. No evidence for hypermetabolic metastatic disease in the neck, chest, abdomen, or pelvis. 3. 4-5 mm posterior right middle lobe pulmonary nodule is new in the interval. Nonspecific on this non breath hold exam,  dedicated CT chest without contrast recommended for more definitive characterization. 4. Left colonic diverticulosis without diverticulitis. 5. Small right groin hernia contains only fat. 6.  Aortic Atherosclerosis    Imaging      Imaging   CT chest wo contrast  1. 4-5 mm posterior right middle lobe pulmonary nodule identified as new on the prior study has resolved in the interval. 2. Interval development  of 3 new nodular ground-glass opacities in the left upper lobe measuring up to 1.6 x 0.7 cm. These are likely infectious/inflammatory. Non-contrast chest CT at 3-6 months is recommended. If the ground glass opacities persist, subsequent management will be based upon the most suspicious findings. This recommendation follows the consensus statement: Guidelines for Management of Incidental Pulmonary Nodules Detected on CT Images: From the Fleischner Society 2017; Radiology 2017; 281: 228-243. 3. Tiny hiatal hernia. 4.  Aortic Atherosclerosis   Esophageal adenocarcinoma (HCC)  04/21/2021 Initial Diagnosis   Esophageal adenocarcinoma (HCC)   05/23/2021 - 07/04/2021 Chemotherapy   Patient is on Treatment Plan : ESOPHAGUS Carboplatin/PACLitaxel weekly x 6 weeks with XRT          INTERVAL HISTORY Caydin Hearns. is a 77 y.o. 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  He reports feeling well.  Appetite is fair. Weight is stable. Denies any swallowing difficulty, nausea vomiting, epigastric pain.  + cough at night, recent pollen allergy    Past Medical History:  Diagnosis Date   Anxiety    Cancer (HCC)    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   Hypercholesterolemia    Hypertension     Past Surgical History:  Procedure Laterality Date   CATARACT EXTRACTION W/PHACO Left 12/03/2017   Procedure: CATARACT EXTRACTION PHACO AND INTRAOCULAR LENS PLACEMENT (IOC) COMPLICATED DIABETIC LEFT;  Surgeon: Lockie Mola, MD;  Location: [facility];  Service: Ophthalmology;   Laterality: Left;  Diabetic - oral meds   CHOLECYSTECTOMY     COLONOSCOPY  2016   ESOPHAGOGASTRODUODENOSCOPY (EGD) WITH PROPOFOL N/A 04/08/2022   Procedure: ESOPHAGOGASTRODUODENOSCOPY (EGD) WITH PROPOFOL;  Surgeon: Meridee Score Netty Starring., MD;  Location: [facility];  Service: Gastroenterology;  Laterality: N/A;   ESOPHAGOGASTRODUODENOSCOPY (EGD) WITH PROPOFOL N/A 09/12/2022   Procedure: ESOPHAGOGASTRODUODENOSCOPY (EGD) WITH PROPOFOL;  Surgeon: Meridee Score Netty Starring., MD;  Location: [facility];  Service: Gastroenterology;  Laterality: N/A;   GI RADIOFREQUENCY ABLATION  04/08/2022   Procedure: GI RADIOFREQUENCY ABLATION;  Surgeon: Meridee Score Netty Starring., MD;  Location: [facility];  Service: Gastroenterology;;   GI RADIOFREQUENCY ABLATION N/A 09/12/2022   Procedure: GI RADIOFREQUENCY ABLATION;  Surgeon: Lemar Lofty., MD;  Location: [facility];  Service: Gastroenterology;  Laterality: N/A;   PORTA CATH INSERTION N/A 11/13/2020   Procedure: PORTA CATH INSERTION;  Surgeon: Annice Needy, MD;  Location: [facility];  Service: Cardiovascular;  Laterality: N/A;    Family History  Problem Relation Age of Onset   Cancer Mother    Colon cancer Neg Hx    Colon polyps Neg Hx    Esophageal cancer  Neg Hx    Rectal cancer Neg Hx    Stomach cancer Neg Hx    Inflammatory bowel disease Neg Hx    Liver disease Neg Hx    Pancreatic cancer Neg Hx     Social History:  reports that he quit smoking about 38 years ago. His smoking use included cigarettes. He has never used smokeless tobacco. He reports that he does not currently use alcohol. He reports that he does not use drugs. He quit smoking cold [place] in 1986. He smoked 2 packs per day for 20 years. 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 He was exposed to [employer]. He worked with a Architect for 34 years. The patient is accompanied by his wife today.  Allergies: No Known Allergies  Current  Medications: Current Outpatient Medications  Medication Sig Dispense Refill   apixaban (ELIQUIS) 2.5 MG TABS tablet Take 1 tablet (2.5 mg total) by mouth 2 (two) times daily. 60 tablet 5   betamethasone dipropionate 0.05 % lotion Apply topically 2 (two) times daily as needed (Rash). 45 mL 0   betamethasone valerate lotion (VALISONE) 0.1 % Apply 1 application  topically 2 (two) times daily as needed (dry scalp).     brimonidine-timolol (COMBIGAN) 0.2-0.5 % ophthalmic solution Place 1 drop into the left eye 2 (two) times daily.     calcium carbonate (OS-CAL) 600 MG TABS tablet Take 600 mg by mouth daily.     cholecalciferol (VITAMIN D3) 25 MCG (1000 UNIT) tablet Take 1,000 Units by mouth daily.     citalopram (CELEXA) 10 MG tablet TAKE 1 TABLET BY MOUTH EVERY DAY 90 tablet 1   GI Cocktail (alum & mag hydroxide, lidocaine, dicyclomine) oral mixture Take 30 mLs by mouth 4 (four) times daily. Swish and swallow 450 mL 2   glucose blood test strip      losartan (COZAAR) 25 MG tablet Take 25 mg by mouth 2 (two) times daily.     Magnesium 400 MG TABS Take 400 mg by mouth daily.     metFORMIN (GLUCOPHAGE-XR) 500 MG 24 hr tablet Take 500 mg by mouth daily with breakfast.     Multiple Vitamin (MULTIVITAMIN) tablet Take 1 tablet by mouth daily.     pantoprazole (PROTONIX) 40 MG tablet TAKE 1 TABLET (40 MG TOTAL) BY MOUTH TWICE A DAY BEFORE MEALS 180 tablet 2   Phenylephrine HCl (4-WAY FAST ACTING NA) Place 1 spray into the nose daily as needed (congestion).     simvastatin (ZOCOR) 40 MG tablet Take 40 mg by mouth daily at 6 PM.     terbinafine (LAMISIL) 250 MG tablet Take 1 tablet (250 mg total) by mouth daily. 30 tablet 2   traZODone (DESYREL) 50 MG tablet TAKE 1 TABLET BY MOUTH EVERY DAY AT BEDTIME AS NEEDED FOR SLEEP 90 tablet 1   vitamin B-12 (CYANOCOBALAMIN) 1000 MCG tablet Take 1,000 mcg by mouth daily.     nystatin (MYCOSTATIN) 100000 UNIT/ML suspension Take 5 mLs (500,000 Units total) by mouth 4  (four) times daily. Swish and spit (Patient not taking: Reported on 12/20/2022) 473 mL 0   Current Facility-Administered Medications  Medication Dose Route Frequency Provider Last Rate Last Admin   0.9 %  sodium chloride infusion  500 mL Intravenous Once Pyrtle, Carie Caddy, MD       Facility-Administered Medications Ordered in Other Visits  Medication Dose Route Frequency Provider Last Rate Last Admin   0.9 % NaCl with KCl 20  mEq/ L  infusion   Intravenous Once Nelva Nay C, MD       heparin lock flush 100 UNIT/ML injection            heparin lock flush 100 unit/mL  500 Units Intravenous Once Rickard Patience, MD       magnesium sulfate IVPB 2 g 50 mL  2 g Intravenous Once Corcoran, Melissa C, MD       sodium chloride flush (NS) 0.9 % injection 10 mL  10 mL Intravenous Once Rickard Patience, MD        Review of Systems  Constitutional: Negative.  Negative for chills, fever, malaise/fatigue and weight loss.  HENT:  Negative for congestion, ear pain and tinnitus.   Eyes: Negative.  Negative for blurred vision and double vision.  Respiratory: Negative.  Negative for cough, sputum production and shortness of breath.   Cardiovascular:  Negative for chest pain and palpitations.  Gastrointestinal: Negative.  Negative for abdominal pain, constipation, diarrhea, nausea and vomiting.  Genitourinary:  Negative for dysuria, frequency and urgency.  Musculoskeletal:  Negative for back pain and falls.  Skin: Negative.  Negative for rash.  Neurological: Negative.  Negative for weakness and headaches.  Endo/Heme/Allergies: Negative.  Does not bruise/bleed easily.  Psychiatric/Behavioral: Negative.  Negative for depression. The patient is not nervous/anxious and does not have insomnia.    Performance status (ECOG): 1  Vitals Blood pressure (!) 145/74, pulse (!) 56, temperature (!) 96.9 F (36.1 C), temperature source Tympanic, resp. rate 18, weight 196 lb 4.8 oz (89 kg), SpO2 96 %.    Physical  Exam Constitutional:      General: He is not in acute distress. HENT:     Head: Normocephalic.     Nose: Nose normal.     Mouth/Throat:     Pharynx: No oropharyngeal exudate.  Eyes:     General: No scleral icterus.    Pupils: Pupils are equal, round, and reactive to light.  Cardiovascular:     Rate and Rhythm: Normal rate.     Heart sounds: No murmur heard. Pulmonary:     Effort: Pulmonary effort is normal. No respiratory distress.     Breath sounds: No wheezing.  Abdominal:     General: Bowel sounds are normal. There is no distension.     Palpations: Abdomen is soft.  Musculoskeletal:        General: Normal range of motion.     Cervical back: Normal range of motion.  Skin:    General: Skin is warm and dry.     Findings: No erythema.  Neurological:     Mental Status: He is alert and oriented to person, place, and time. Mental status is at baseline.     Cranial Nerves: No cranial nerve deficit.     Motor: No abnormal muscle tone.  Psychiatric:        Mood and Affect: Mood and affect normal.     Labs    Latest Ref Rng & Units 03/25/2023    9:59 AM 10/07/2022   12:50 PM 04/22/2022   12:30 PM  CBC  WBC 4.0 - 10.5 K/uL 4.9  5.2  5.6   Hemoglobin 13.0 - 17.0 g/dL 18.8  41.6  60.6   Hematocrit 39.0 - 52.0 % 41.1  39.0  40.6   Platelets 150 - 400 K/uL 155  183  180       Latest Ref Rng & Units 03/25/2023    9:59 AM 10/07/2022  12:50 PM 04/22/2022   12:30 PM  CMP  Glucose 70 - 99 mg/dL 782  956  213   BUN 8 - 23 mg/dL 15  15  18    Creatinine 0.61 - 1.24 mg/dL 0.86  5.78  4.69   Sodium 135 - 145 mmol/L 135  135  137   Potassium 3.5 - 5.1 mmol/L 3.8  3.8  3.7   Chloride 98 - 111 mmol/L 101  100  105   CO2 22 - 32 mmol/L 26  30  28    Calcium 8.9 - 10.3 mg/dL 8.8  8.9  8.6   Total Protein 6.5 - 8.1 g/dL 6.5  6.7  6.9   Total Bilirubin 0.3 - 1.2 mg/dL 0.9  0.6  0.6   Alkaline Phos 38 - 126 U/L 51  53  53   AST 15 - 41 U/L 21  27  27    ALT 0 - 44 U/L 19  24  20

## 2023-03-25 NOTE — Assessment & Plan Note (Signed)
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 completed concurrent radiation and cisplatin  Continue to follow-up  with ENT.

## 2023-03-25 NOTE — Assessment & Plan Note (Signed)
CT was reviewed and discussed with patient.  Probably infectious/inflammation.  Repeat CT in 2-3 months.

## 2023-03-25 NOTE — Assessment & Plan Note (Signed)
Bilateral pulmonary emboli and RLE DVT - 01/2021 Continue Eliquis to 2.5 mg twice daily for prophylaxis.  

## 2023-03-26 ENCOUNTER — Telehealth: Payer: Self-pay | Admitting: Gastroenterology

## 2023-03-26 NOTE — Telephone Encounter (Signed)
Dr Meridee Score the pt would like you to review his recent CT prior to setting up recall EGD.

## 2023-03-26 NOTE — Telephone Encounter (Signed)
Patty, I see no contraindication based on the CT scan findings why we would not perform repeat EGD with possible RFA.  Please move forward with scheduling this.  The CT scan has been reviewed by his oncologist.  Next available 60-minute EGD.  This patient can be done on my hospital week if necessary.  Thanks. GM

## 2023-03-26 NOTE — Telephone Encounter (Signed)
Patient is calling wondering if Dr Meridee Score would be able to review his recent CT scan before scheduling for his next EGD. Please advise

## 2023-03-27 LAB — CEA: CEA: 0.6 ng/mL (ref 0.0–4.7)

## 2023-03-28 ENCOUNTER — Other Ambulatory Visit: Payer: Self-pay

## 2023-03-28 DIAGNOSIS — K22711 Barrett's esophagus with high grade dysplasia: Secondary | ICD-10-CM

## 2023-03-28 NOTE — Telephone Encounter (Signed)
EGD RFA has been scheduled for 04/01/23 at 9 am at [facility] with GM   EGD RFA scheduled, pt instructed and medications reviewed.  Patient instructions sent to My Chart per pt request.  Patient to call with any questions or concerns.

## 2023-03-28 NOTE — Telephone Encounter (Signed)
Patient wife called returning phone call. States that she was unable to hear voicemail. Please advise, thank you.

## 2023-03-28 NOTE — Telephone Encounter (Signed)
Left message on machine to call back to offer 5/14 at 9 am with GM at [facility]

## 2023-03-31 ENCOUNTER — Telehealth: Payer: Self-pay | Admitting: Gastroenterology

## 2023-03-31 ENCOUNTER — Encounter (HOSPITAL_COMMUNITY): Payer: Self-pay | Admitting: Gastroenterology

## 2023-03-31 NOTE — Telephone Encounter (Signed)
Inbound call from patient want to make sure that his [place] auth was received for his procedure tomorrow 05/14 at [facility]. Please advise, thank you.

## 2023-03-31 NOTE — Telephone Encounter (Signed)
Judeth Cornfield can you guide me with this? I am not sure what I need to do.

## 2023-03-31 NOTE — Telephone Encounter (Signed)
This patient walked in stating that he has a hospital procedure scheduled tomorrow with Dr. Meridee Score. He is wanting the [facility] to pay for this but no continuation of care was requested. Patient is wanting to know if he should wait for August to have this procedure done that way the [facility] will pay for this?   If patient does reschedule to August then you will need to send a continuation of care to the [place] to obtain new authorization and valid dates.

## 2023-04-01 ENCOUNTER — Ambulatory Visit (HOSPITAL_COMMUNITY): Payer: Medicare Other | Admitting: Registered Nurse

## 2023-04-01 ENCOUNTER — Ambulatory Visit (HOSPITAL_COMMUNITY)
Admission: RE | Admit: 2023-04-01 | Discharge: 2023-04-01 | Disposition: A | Discharge status: Home / Self Care | Payer: Medicare Other | Attending: Gastroenterology | Admitting: Gastroenterology

## 2023-04-01 ENCOUNTER — Ambulatory Visit (HOSPITAL_BASED_OUTPATIENT_CLINIC_OR_DEPARTMENT_OTHER): Payer: Medicare Other | Admitting: Registered Nurse

## 2023-04-01 ENCOUNTER — Other Ambulatory Visit: Payer: Self-pay | Admitting: Psychiatry

## 2023-04-01 ENCOUNTER — Encounter (HOSPITAL_COMMUNITY)
Admission: RE | Disposition: A | Discharge status: Home / Self Care | Payer: Self-pay | Source: Home / Self Care | Attending: Gastroenterology

## 2023-04-01 ENCOUNTER — Other Ambulatory Visit: Payer: Self-pay

## 2023-04-01 ENCOUNTER — Other Ambulatory Visit: Payer: Self-pay | Admitting: Dermatology

## 2023-04-01 ENCOUNTER — Encounter (HOSPITAL_COMMUNITY): Payer: Self-pay | Admitting: Gastroenterology

## 2023-04-01 DIAGNOSIS — F5101 Primary insomnia: Secondary | ICD-10-CM

## 2023-04-01 DIAGNOSIS — K22711 Barrett's esophagus with high grade dysplasia: Secondary | ICD-10-CM | POA: Insufficient documentation

## 2023-04-01 DIAGNOSIS — K317 Polyp of stomach and duodenum: Secondary | ICD-10-CM | POA: Diagnosis not present

## 2023-04-01 DIAGNOSIS — E119 Type 2 diabetes mellitus without complications: Secondary | ICD-10-CM | POA: Diagnosis not present

## 2023-04-01 DIAGNOSIS — I10 Essential (primary) hypertension: Secondary | ICD-10-CM | POA: Diagnosis not present

## 2023-04-01 DIAGNOSIS — D175 Benign lipomatous neoplasm of intra-abdominal organs: Secondary | ICD-10-CM | POA: Diagnosis not present

## 2023-04-01 DIAGNOSIS — K3189 Other diseases of stomach and duodenum: Secondary | ICD-10-CM | POA: Insufficient documentation

## 2023-04-01 DIAGNOSIS — Z8501 Personal history of malignant neoplasm of esophagus: Secondary | ICD-10-CM | POA: Insufficient documentation

## 2023-04-01 DIAGNOSIS — Z09 Encounter for follow-up examination after completed treatment for conditions other than malignant neoplasm: Secondary | ICD-10-CM

## 2023-04-01 DIAGNOSIS — B353 Tinea pedis: Secondary | ICD-10-CM

## 2023-04-01 DIAGNOSIS — Z7984 Long term (current) use of oral hypoglycemic drugs: Secondary | ICD-10-CM | POA: Diagnosis not present

## 2023-04-01 DIAGNOSIS — Z79899 Other long term (current) drug therapy: Secondary | ICD-10-CM | POA: Insufficient documentation

## 2023-04-01 DIAGNOSIS — Z86718 Personal history of other venous thrombosis and embolism: Secondary | ICD-10-CM | POA: Insufficient documentation

## 2023-04-01 DIAGNOSIS — K219 Gastro-esophageal reflux disease without esophagitis: Secondary | ICD-10-CM | POA: Insufficient documentation

## 2023-04-01 DIAGNOSIS — Z87891 Personal history of nicotine dependence: Secondary | ICD-10-CM | POA: Insufficient documentation

## 2023-04-01 DIAGNOSIS — Z923 Personal history of irradiation: Secondary | ICD-10-CM | POA: Insufficient documentation

## 2023-04-01 DIAGNOSIS — K449 Diaphragmatic hernia without obstruction or gangrene: Secondary | ICD-10-CM

## 2023-04-01 DIAGNOSIS — F419 Anxiety disorder, unspecified: Secondary | ICD-10-CM

## 2023-04-01 HISTORY — PX: ESOPHAGOGASTRODUODENOSCOPY (EGD) WITH PROPOFOL: SHX5813

## 2023-04-01 HISTORY — PX: GI RADIOFREQUENCY ABLATION: SHX6807

## 2023-04-01 LAB — GLUCOSE, CAPILLARY
Glucose-Capillary: 221 mg/dL — ABNORMAL HIGH (ref 70–99)
Glucose-Capillary: 207 mg/dL — ABNORMAL HIGH (ref 70–99)

## 2023-04-01 SURGERY — ESOPHAGOGASTRODUODENOSCOPY (EGD) WITH PROPOFOL
Anesthesia: Monitor Anesthesia Care

## 2023-04-01 MED ORDER — STERILE WATER FOR INJECTION IJ SOLN
RESPIRATORY_TRACT | Status: DC | PRN
  Administered 2023-04-01: 35 mL via OROMUCOSAL
  Administered 2023-04-01: 25 mL via OROMUCOSAL

## 2023-04-01 MED ORDER — SODIUM CHLORIDE 0.9 % IV SOLN: INTRAVENOUS | Status: DC

## 2023-04-01 MED ORDER — ACETYLCYSTEINE 20 % IN SOLN
RESPIRATORY_TRACT | Status: AC
  Filled 2023-04-01: qty 4

## 2023-04-01 MED ORDER — AMBULATORY NON FORMULARY MEDICATION: 30.0000 mL | Freq: Four times a day (QID) | 2 refills | Status: DC

## 2023-04-01 MED ORDER — LACTATED RINGERS IV SOLN
INTRAVENOUS | Status: DC
  Administered 2023-04-01: 1000 mL via INTRAVENOUS

## 2023-04-01 MED ORDER — PROPOFOL 1000 MG/100ML IV EMUL
INTRAVENOUS | Status: AC
  Filled 2023-04-01: qty 100

## 2023-04-01 MED ORDER — ACETAMINOPHEN-CODEINE 120-12 MG/5ML PO SOLN: 10.0000 mL | Freq: Four times a day (QID) | ORAL | 0 refills | Status: DC | PRN

## 2023-04-01 MED ORDER — LIDOCAINE 2% (20 MG/ML) 5 ML SYRINGE
INTRAMUSCULAR | Status: DC | PRN
  Administered 2023-04-01: 40 mg via INTRAVENOUS

## 2023-04-01 MED ORDER — APIXABAN 2.5 MG PO TABS: 2.5000 mg | ORAL_TABLET | Freq: Two times a day (BID) | ORAL | 5 refills | Status: DC

## 2023-04-01 MED ORDER — PROPOFOL 500 MG/50ML IV EMUL
INTRAVENOUS | Status: DC | PRN
  Administered 2023-04-01: 130 ug/kg/min via INTRAVENOUS
  Administered 2023-04-01 (×2): 20 mg via INTRAVENOUS
  Administered 2023-04-01: 10 mg via INTRAVENOUS

## 2023-04-01 MED ORDER — SUCRALFATE 1 G PO TABS: 1.0000 g | ORAL_TABLET | Freq: Four times a day (QID) | ORAL | 1 refills | Status: DC

## 2023-04-01 SURGICAL SUPPLY — 15 items

## 2023-04-01 NOTE — Anesthesia Procedure Notes (Signed)
Procedure Name: MAC Date/Time: 04/01/2023 9:08 AM  Performed by: Elisabeth Cara, CRNAPre-anesthesia Checklist: Patient identified, Emergency Drugs available, Suction available, Patient being monitored and Timeout performed Patient Re-evaluated:Patient Re-evaluated prior to induction Oxygen Delivery Method: Simple face mask Preoxygenation: Pre-oxygenation with 100% oxygen Induction Type: IV induction Placement Confirmation: positive ETCO2 Comments: POM

## 2023-04-01 NOTE — Transfer of Care (Signed)
Immediate Anesthesia Transfer of Care Note  Patient: Sean Garner.  Procedure(s) Performed: ESOPHAGOGASTRODUODENOSCOPY (EGD) WITH PROPOFOL GI RADIOFREQUENCY ABLATION  Patient Location: PACU and Endoscopy Unit  Anesthesia Type:MAC  Level of Consciousness: awake, alert , oriented, and patient cooperative  Airway & Oxygen Therapy: Patient Spontanous Breathing and Patient connected to face mask oxygen  Post-op Assessment: Report given to RN, Post -op Vital signs reviewed and stable, and Patient moving all extremities  Post vital signs: Reviewed and stable  Last Vitals:  Vitals Value Taken Time  BP    Temp    Pulse    Resp    SpO2      Last Pain:  Vitals:   04/01/23 0743  TempSrc: Tympanic  PainSc: 0-No pain         Complications: No notable events documented.

## 2023-04-01 NOTE — Anesthesia Preprocedure Evaluation (Addendum)
Anesthesia Evaluation  Patient identified by MRN, date of birth, ID band Patient awake    Reviewed: Allergy & Precautions, NPO status , Patient's Chart, lab work & pertinent test results  History of Anesthesia Complications Negative for: history of anesthetic complications  Airway Mallampati: II  TM Distance: >3 FB Neck ROM: Full    Dental  (+) Dental Advisory Given, Teeth Intact   Pulmonary former smoker, PE   Pulmonary exam normal        Cardiovascular hypertension, Pt. on medications + DVT  Normal cardiovascular exam     Neuro/Psych  PSYCHIATRIC DISORDERS Anxiety     negative neurological ROS     GI/Hepatic Neg liver ROS,GERD  Medicated and Controlled,, Esophageal cancer    Endo/Other  diabetes, Type 2, Oral Hypoglycemic Agents    Renal/GU negative Renal ROS     Musculoskeletal  (+) Arthritis ,    Abdominal   Peds  Hematology  On eliquis    Anesthesia Other Findings   Reproductive/Obstetrics                             Anesthesia Physical Anesthesia Plan  ASA: 3  Anesthesia Plan: MAC   Post-op Pain Management: Minimal or no pain anticipated   Induction:   PONV Risk Score and Plan: 1 and Propofol infusion and Treatment may vary due to age or medical condition  Airway Management Planned: Nasal Cannula and Natural Airway  Additional Equipment: None  Intra-op Plan:   Post-operative Plan:   Informed Consent: I have reviewed the patients History and Physical, chart, labs and discussed the procedure including the risks, benefits and alternatives for the proposed anesthesia with the patient or authorized representative who has indicated his/her understanding and acceptance.       Plan Discussed with: CRNA and Anesthesiologist  Anesthesia Plan Comments:        Anesthesia Quick Evaluation

## 2023-04-01 NOTE — H&P (Signed)
GASTROENTEROLOGY PROCEDURE H&P NOTE   Primary Care Physician: [name], MD  HPI: Sean Garner. is a 77 y.o. male who presents for EGD for follow up esophageal cancer and prior XRT with development of HGD/LGD who has had RFA for this Barrett's esophagus.  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   Hypercholesterolemia    Hypertension    Past Surgical History:  Procedure Laterality Date   CATARACT EXTRACTION W/PHACO Left 12/03/2017   Procedure: CATARACT EXTRACTION PHACO AND INTRAOCULAR LENS PLACEMENT (IOC) COMPLICATED DIABETIC LEFT;  Surgeon: Lockie Mola, MD;  Location: [facility];  Service: Ophthalmology;  Laterality: Left;  Diabetic - oral meds   CHOLECYSTECTOMY     COLONOSCOPY  2016   ESOPHAGOGASTRODUODENOSCOPY (EGD) WITH PROPOFOL N/A 04/08/2022   Procedure: ESOPHAGOGASTRODUODENOSCOPY (EGD) WITH PROPOFOL;  Surgeon: Meridee Score Netty Starring., MD;  Location: [facility];  Service: Gastroenterology;  Laterality: N/A;   ESOPHAGOGASTRODUODENOSCOPY (EGD) WITH PROPOFOL N/A 09/12/2022   Procedure: ESOPHAGOGASTRODUODENOSCOPY (EGD) WITH PROPOFOL;  Surgeon: Meridee Score Netty Starring., MD;  Location: [facility];  Service: Gastroenterology;  Laterality: N/A;   GI RADIOFREQUENCY ABLATION  04/08/2022   Procedure: GI RADIOFREQUENCY ABLATION;  Surgeon: Meridee Score Netty Starring., MD;  Location: [facility];  Service: Gastroenterology;;   GI RADIOFREQUENCY ABLATION N/A 09/12/2022   Procedure: GI RADIOFREQUENCY ABLATION;  Surgeon: Lemar Lofty., MD;  Location: [facility];  Service: Gastroenterology;  Laterality: N/A;   PORTA CATH INSERTION N/A  11/13/2020   Procedure: PORTA CATH INSERTION;  Surgeon: Annice Needy, MD;  Location: [facility];  Service: Cardiovascular;  Laterality: N/A;   Current Facility-Administered Medications  Medication Dose Route Frequency Provider Last Rate Last Admin   0.9 %  sodium chloride infusion   Intravenous Continuous Mansouraty, Netty Starring., MD       lactated ringers infusion   Intravenous Continuous Mansouraty, Netty Starring., MD       Facility-Administered Medications Ordered in Other Encounters  Medication Dose Route Frequency Provider Last Rate Last Admin   0.9 % NaCl with KCl 20 mEq/ L  infusion   Intravenous Once Merlene Pulling, Melissa C, MD       heparin lock flush 100 UNIT/ML injection            heparin lock flush 100 unit/mL  500 Units Intravenous Once Rickard Patience, MD       magnesium sulfate IVPB 2 g 50 mL  2 g Intravenous Once Corcoran, Melissa C, MD       sodium chloride flush (NS) 0.9 % injection 10 mL  10 mL Intravenous Once Rickard Patience, MD        Current Facility-Administered Medications:    0.9 %  sodium chloride infusion, , Intravenous, Continuous, Mansouraty, Netty Starring., MD   lactated ringers infusion, , Intravenous, Continuous, Mansouraty, Netty Starring., MD  Facility-Administered Medications Ordered in Other Encounters:    0.9 % NaCl with KCl 20 mEq/ L  infusion, , Intravenous, Once, Corcoran, Melissa C, MD   heparin lock flush 100 UNIT/ML injection, , , ,    heparin lock flush 100 unit/mL, 500  Units, Intravenous, Once, Rickard Patience, MD   magnesium sulfate IVPB 2 g 50 mL, 2 g, Intravenous, Once, Corcoran, Melissa C, MD   sodium chloride flush (NS) 0.9 % injection 10 mL, 10 mL, Intravenous, Once, Rickard Patience, MD No Known Allergies Family History  Problem Relation Age of Onset   Cancer Mother    Colon cancer Neg Hx    Colon polyps Neg Hx    Esophageal cancer Neg Hx    Rectal cancer Neg Hx    Stomach cancer Neg Hx    Inflammatory bowel disease Neg Hx    Liver disease Neg Hx     Pancreatic cancer Neg Hx    Social History   Socioeconomic History   Marital status: Married    Spouse name: Not on file   Number of children: Not on file   Years of education: Not on file   Highest education level: Not on file  Occupational History   Not on file  Tobacco Use   Smoking status: Former    Types: Cigarettes    Quit date: 38    Years since quitting: 38.3   Smokeless tobacco: Never  Vaping Use   Vaping Use: Never used  Substance and Sexual Activity   Alcohol use: Not Currently    Comment: occasional - 1 glass wine/month   Drug use: Never   Sexual activity: Not on file  Other Topics Concern   Not on file  Social History Narrative   Not on file   Social Determinants of Health   Financial Resource Strain: Not on file  Food Insecurity: Not on file  Transportation Needs: Not on file  Physical Activity: Not on file  Stress: Not on file  Social Connections: Not on file  Intimate Partner Violence: Not on file    Physical Exam: There were no vitals filed for this visit. There is no height or weight on file to calculate BMI. GEN: NAD EYE: Sclerae anicteric ENT: MMM CV: Non-tachycardic GI: Soft, NT/ND NEURO:  Alert & Oriented x 3  Lab Results: No results for input(s): "WBC", "HGB", "HCT", "PLT" in the last 72 hours. BMET No results for input(s): "NA", "K", "CL", "CO2", "GLUCOSE", "BUN", "CREATININE", "CALCIUM" in the last 72 hours. LFT No results for input(s): "PROT", "ALBUMIN", "AST", "ALT", "ALKPHOS", "BILITOT", "BILIDIR", "IBILI" in the last 72 hours. PT/INR No results for input(s): "LABPROT", "INR" in the last 72 hours.   Impression / Plan: This is a 77 y.o.male who presents for EGD for follow up esophageal cancer and prior XRT with development of HGD/LGD who has had RFA for this Barrett's esophagus.  The risks and benefits of endoscopic evaluation/treatment were discussed with the patient and/or family; these include but are not limited to the  risk of perforation, infection, bleeding, missed lesions, lack of diagnosis, severe illness requiring hospitalization, as well as anesthesia and sedation related illnesses.  The patient's history has been reviewed, patient examined, no change in status, and deemed stable for procedure.  The patient and/or family is agreeable to proceed.    Corliss Parish, MD [facility] Office # [phone]

## 2023-04-01 NOTE — Anesthesia Postprocedure Evaluation (Signed)
Anesthesia Post Note  Patient: Sean Garner.  Procedure(s) Performed: ESOPHAGOGASTRODUODENOSCOPY (EGD) WITH PROPOFOL GI RADIOFREQUENCY ABLATION     Patient location during evaluation: PACU Anesthesia Type: MAC Level of consciousness: awake and alert Pain management: pain level controlled Vital Signs Assessment: post-procedure vital signs reviewed and stable Respiratory status: spontaneous breathing, nonlabored ventilation and respiratory function stable Cardiovascular status: stable and blood pressure returned to baseline Anesthetic complications: no   No notable events documented.  Last Vitals:  Vitals:   04/01/23 0950 04/01/23 1000  BP: (!) 162/60   Pulse: (!) 55 (!) 52  Resp: 13 20  Temp:    SpO2: 97% 97%    Last Pain:  Vitals:   04/01/23 0941  TempSrc: Temporal  PainSc: 0-No pain                 Beryle Lathe

## 2023-04-01 NOTE — Discharge Instructions (Signed)
YOU HAD AN ENDOSCOPIC PROCEDURE TODAY: Refer to the procedure report and other information in the discharge instructions given to you for any specific questions about what was found during the examination. If this information does not answer your questions, please call [place] office at [phone] to clarify.   YOU SHOULD EXPECT: Some feelings of bloating in the abdomen. Passage of more gas than usual. Walking can help get rid of the air that was put into your GI tract during the procedure and reduce the bloating. If you had a lower endoscopy (such as a colonoscopy or flexible sigmoidoscopy) you may notice spotting of blood in your stool or on the toilet paper. Some abdominal soreness may be present for a day or two, also.  DIET: Your first meal following the procedure should be a light meal and then it is ok to progress to your normal diet. A half-sandwich or bowl of soup is an example of a good first meal. Heavy or fried foods are harder to digest and may make you feel nauseous or bloated. Drink plenty of fluids but you should avoid alcoholic beverages for 24 hours. If you had a esophageal dilation, please see attached instructions for diet.    ACTIVITY: Your care partner should take you home directly after the procedure. You should plan to take it easy, moving slowly for the rest of the day. You can resume normal activity the day after the procedure however YOU SHOULD NOT DRIVE, use power tools, machinery or perform tasks that involve climbing or major physical exertion for 24 hours (because of the sedation medicines used during the test).   SYMPTOMS TO REPORT IMMEDIATELY: A gastroenterologist can be reached at any hour. Please call [phone]  for any of the following symptoms:   Following upper endoscopy (EGD, EUS, ERCP, esophageal dilation) Vomiting of blood or coffee ground material  New, significant abdominal pain  New, significant chest pain or pain under the shoulder blades  Painful or  persistently difficult swallowing  New shortness of breath  Black, tarry-looking or red, bloody stools  FOLLOW UP:  If any biopsies were taken you will be contacted by phone or by letter within the next 1-3 weeks. Call [phone]  if you have not heard about the biopsies in 3 weeks.  Please also call with any specific questions about appointments or follow up tests.  

## 2023-04-01 NOTE — Op Note (Signed)
[facility] Patient Name: Sean Garner Procedure Date: 04/01/2023 MRN: [id number] Attending MD: Corliss Parish , MD, [id number] Date of Birth: [birth year] CSN: [id number] Age: 77 Admit Type: Outpatient Procedure:                Upper GI endoscopy Indications:              For endoscopic therapy of Barrett's esophagus with                            low grade dysplasia, For endoscopic therapy of                            Barrett's esophagus with high grade dysplasia,                            Follow-up of previous ablation treatment of                            Barrett's esophagus, Personal history of malignant                            esophageal neoplasm Providers:                Corliss Parish, MD, Fransisca Connors, Irene Shipper, Technician, Lauree Chandler. [place], CRNA Referring MD:             Carie Caddy. Rhea Belton, MD, Hassell Halim MD, Rickard Patience, MD Medicines:                Monitored Anesthesia Care Complications:            No immediate complications. Estimated Blood Loss:     Estimated blood loss was minimal. Procedure:                Pre-Anesthesia Assessment:                           - Prior to the procedure, a History and Physical                            was performed, and patient medications and                            allergies were reviewed. The patient's tolerance of                            previous anesthesia was also reviewed. The risks                            and benefits of the procedure and the sedation                            options and risks were discussed with the patient.  All questions were answered, and informed consent                            was obtained. Prior Anticoagulants: The patient has                            taken Eliquis (apixaban), last dose was 2 days                            prior to procedure. ASA Grade Assessment: III - A                             patient with severe systemic disease. After                            reviewing the risks and benefits, the patient was                            deemed in satisfactory condition to undergo the                            procedure.                           After obtaining informed consent, the endoscope was                            passed under direct vision. Throughout the                            procedure, the patient's blood pressure, pulse, and                            oxygen saturations were monitored continuously. The                            [id number]-H190 ([id number]) Olympus endoscope was introduced                            through the mouth, and advanced to the second part                            of duodenum. The upper GI endoscopy was                            accomplished without difficulty. The patient                            tolerated the procedure. Scope In: Scope Out: Findings:      No gross lesions were noted in the proximal esophagus and in the mid       esophagus.      The esophagus and gastroesophageal junction were examined with white       light and narrow band imaging (NBI) from a forward  view and retroflexed       position. There were esophageal mucosal changes consistent with       Barrett's esophagus in the very distal esophagus. These changes involved       the mucosa along an irregular Z-line (38 cm from the incisors). One       tongue of salmon-colored mucosa was present from 37 to 38 cm. The       maximum longitudinal extent of these esophageal mucosal changes was 1 cm       in length. This is much improved from her initial endoscopic ablation.       Focal radiofrequency ablation of the presumed Barrett's esophagus was       performed. With the endoscope in place, the position and extent of the       Barrett's mucosa and the anatomic landmarks including proximal and       distal extent of Barrett's mucosa and top of gastric folds were noted.        Endoscopic visualization identified an ablation site including the       entire visible Barrett's segment. The Barrett's mucosa was irrigated       with N-acetylcysteine (Mucomyst) 1% mixed with water. Esophageal       contents were suctioned. The endoscope was then removed from the       patient. The Barrx-90 radiofrequency ablation catheter was attached to       the tip of the endoscope. The endoscope with the attached radiofrequency       ablation catheter was then passed transorally under direct vision into       the esophagus and advanced to the areas of Barrett's mucosa. The areas       included tongues of Barrett's mucosa. The radiofrequency ablation       catheter was placed in contact with the surface of the Barrett's mucosa       under direct visualization and energy was applied twice at 12 J/cm2.       Ablation was repeated in a likewise fashion to treat all visible       Barrett's mucosa and treat circumferentially around the squamocolumnar       junction. The ablation zone was cleaned of coagulative debris. The       ablation catheter and endoscope were then removed and the catheter was       cleaned. The catheter and endoscope were reinserted into the esophagus.       A second round of ablation was then performed. Energy was applied twice       at 12 J/cm2 to retreat the areas of Barrett's epithelium that had been       treated with the first series of ablation. The areas of the esophagus       where Barrett's mucosa had been ablated were examined. Areas of visible       Barrett's esophagus were completely ablated. The total number of energy       applications for all mucosal sites treated was 34. There was no evidence       for perforation.      A 4 cm hiatal hernia was present.      Striped mildly erythematous mucosa without bleeding was found in the       gastric antrum. This has previously been biopsied and negative for H.       pylori. It was not rebiopsied.      There  was a small lipoma, 8 mm in diameter, in the second portion of the       duodenum.      No other gross lesions were noted in the duodenal bulb, in the first       portion of the duodenum and in the second portion of the duodenum. Impression:               - No gross lesions in the proximal esophagus and in                            the mid esophagus.                           - Esophageal mucosal changes consistent with                            Barrett's esophagus in the very distal esophagus (1                            tongue). Treated with radiofrequency ablation as                            documented above.                           - 4 cm hiatal hernia.                           - Erythematous mucosa in the antrum. Previously                            negative for H. pylori on biopsies.                           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                           - No other gross lesions in the duodenal bulb, in                            the first portion of the duodenum and in the second                            portion of the duodenum. Moderate Sedation:      Not Applicable - Patient had care per Anesthesia. Recommendation:           - The patient will be observed post-procedure,                            until all discharge criteria are met.                           - Discharge patient to home.                           - Patient  has a contact number available for                            emergencies. The signs and symptoms of potential                            delayed complications were discussed with the                            patient. Return to normal activities tomorrow.                            Written discharge instructions were provided to the                            patient.                           - Clear liquid diet for 24 hours. Then, full liquid                            diet for 48 hours. Then, soft diet for 1 week                             thereafter. Then advance your diet thereafter to                            regular.                           - Continue your PPI 40 mg twice daily for the next                            3 months.                           - You will be prescribed a lidocaine/antiacid                            mixture that you can take up to 4 times per day as                            needed (prescription has been sent to your                            pharmacy) - take no matter what for at least first                            3-days.                           - You will be prescribed liquid Tylenol with  codeine that you can use up to 4 times daily for                            the next 72 hours if needed (prescription to be                            sent to your pharmacy) - I recommend using this                            even if you do not have pain for the first 2-3 days.                           - You will be prescribed Sucralfate Suspension and                            you will take this 4 times daily (make sure you are                            not taking any other medication 1 hour before or 1                            hour after this medication is taken) to allow                            coating and healing of your esophagus - this needs                            to be taken for at least 61-month (you should have                            refills available but call if you run out).                           - May restart Eliquis on 5/16 a.m. to decrease post                            interventional bleeding risk.                           - Continue present medications otherwise                           - Repeat EGD in 3 to 4 months, if all looks well                            which is my hope, then we will begin surveillance.                           - The findings and recommendations were discussed  with the  patient.                           - The findings and recommendations were discussed                            with the patient's family. Procedure Code(s):        --- Professional ---                           [phone], Esophagogastroduodenoscopy, flexible,                            transoral; with ablation of tumor(s), polyp(s), or                            other lesion(s) (includes pre- and post-dilation                            and guide wire passage, when performed) Diagnosis Code(s):        --- Professional ---                           K44.9, Diaphragmatic hernia without obstruction or                            gangrene                           K31.89, Other diseases of stomach and duodenum                           D17.5, Benign lipomatous neoplasm of                            intra-abdominal organs                           K22.711, Barrett's esophagus with high grade                            dysplasia                           Z09, Encounter for follow-up examination after                            completed treatment for conditions other than                            malignant neoplasm                           Z85.01, Personal history of malignant neoplasm of                            esophagus CPT copyright 2022 [employer]. All rights reserved. The codes  documented in this report are preliminary and upon coder review may  be revised to meet current compliance requirements. Corliss Parish, MD 04/01/2023 9:51:54 AM Number of Addenda: 0

## 2023-04-03 ENCOUNTER — Encounter (HOSPITAL_COMMUNITY): Payer: Self-pay | Admitting: Gastroenterology

## 2023-04-07 ENCOUNTER — Ambulatory Visit: Payer: Medicare Other | Admitting: Oncology

## 2023-04-07 ENCOUNTER — Other Ambulatory Visit: Payer: Medicare Other

## 2023-04-30 ENCOUNTER — Other Ambulatory Visit: Payer: Medicare Other

## 2023-05-02 ENCOUNTER — Ambulatory Visit: Payer: Medicare Other | Admitting: Oncology

## 2023-05-14 ENCOUNTER — Other Ambulatory Visit: Payer: Self-pay | Admitting: Gastroenterology

## 2023-05-14 ENCOUNTER — Other Ambulatory Visit: Payer: Self-pay | Admitting: Psychiatry

## 2023-05-14 ENCOUNTER — Other Ambulatory Visit: Payer: Self-pay | Admitting: Dermatology

## 2023-05-14 DIAGNOSIS — F5101 Primary insomnia: Secondary | ICD-10-CM

## 2023-05-14 DIAGNOSIS — B353 Tinea pedis: Secondary | ICD-10-CM

## 2023-05-14 DIAGNOSIS — F411 Generalized anxiety disorder: Secondary | ICD-10-CM

## 2023-05-15 NOTE — Telephone Encounter (Signed)
Patient's wife states that Lamisil refill isn't needed at this time.

## 2023-05-19 ENCOUNTER — Inpatient Hospital Stay: Payer: Medicare Other

## 2023-05-30 ENCOUNTER — Ambulatory Visit
Admission: RE | Admit: 2023-05-30 | Discharge: 2023-05-30 | Disposition: A | Discharge status: Home / Self Care | Payer: Medicare Other | Source: Ambulatory Visit | Attending: Oncology | Admitting: Oncology

## 2023-05-30 DIAGNOSIS — R911 Solitary pulmonary nodule: Secondary | ICD-10-CM | POA: Diagnosis present

## 2023-06-05 ENCOUNTER — Inpatient Hospital Stay (HOSPITAL_BASED_OUTPATIENT_CLINIC_OR_DEPARTMENT_OTHER): Payer: Medicare Other | Admitting: Oncology

## 2023-06-05 ENCOUNTER — Other Ambulatory Visit: Payer: Medicare Other

## 2023-06-05 ENCOUNTER — Inpatient Hospital Stay: Payer: Medicare Other | Attending: Oncology

## 2023-06-05 ENCOUNTER — Encounter: Payer: Self-pay | Admitting: Oncology

## 2023-06-05 VITALS — BP 148/78 | HR 59 | Temp 96.8°F | Resp 18 | Wt 198.5 lb

## 2023-06-05 DIAGNOSIS — Z809 Family history of malignant neoplasm, unspecified: Secondary | ICD-10-CM | POA: Diagnosis not present

## 2023-06-05 DIAGNOSIS — C01 Malignant neoplasm of base of tongue: Secondary | ICD-10-CM

## 2023-06-05 DIAGNOSIS — Z7901 Long term (current) use of anticoagulants: Secondary | ICD-10-CM | POA: Diagnosis not present

## 2023-06-05 DIAGNOSIS — C160 Malignant neoplasm of cardia: Secondary | ICD-10-CM | POA: Diagnosis not present

## 2023-06-05 DIAGNOSIS — C159 Malignant neoplasm of esophagus, unspecified: Secondary | ICD-10-CM

## 2023-06-05 DIAGNOSIS — Z79899 Other long term (current) drug therapy: Secondary | ICD-10-CM | POA: Insufficient documentation

## 2023-06-05 DIAGNOSIS — Z923 Personal history of irradiation: Secondary | ICD-10-CM | POA: Diagnosis not present

## 2023-06-05 DIAGNOSIS — Z87891 Personal history of nicotine dependence: Secondary | ICD-10-CM | POA: Insufficient documentation

## 2023-06-05 DIAGNOSIS — Z86711 Personal history of pulmonary embolism: Secondary | ICD-10-CM | POA: Diagnosis not present

## 2023-06-05 DIAGNOSIS — I2699 Other pulmonary embolism without acute cor pulmonale: Secondary | ICD-10-CM | POA: Diagnosis not present

## 2023-06-05 LAB — CMP (CANCER CENTER ONLY)
ALT: 21 U/L (ref 0–44)
AST: 19 U/L (ref 15–41)
Albumin: 4 g/dL (ref 3.5–5.0)
Alkaline Phosphatase: 48 U/L (ref 38–126)
Anion gap: 9 (ref 5–15)
BUN: 21 mg/dL (ref 8–23)
CO2: 25 mmol/L (ref 22–32)
Calcium: 8.7 mg/dL — ABNORMAL LOW (ref 8.9–10.3)
Chloride: 102 mmol/L (ref 98–111)
Creatinine: 1.01 mg/dL (ref 0.61–1.24)
GFR, Estimated: >60 mL/min (ref >60)
Glucose, Bld: 247 mg/dL — ABNORMAL HIGH (ref 70–99)
Potassium: 3.6 mmol/L (ref 3.5–5.1)
Sodium: 136 mmol/L (ref 135–145)
Total Bilirubin: 0.8 mg/dL (ref 0.3–1.2)
Total Protein: 6.7 g/dL (ref 6.5–8.1)

## 2023-06-05 LAB — CBC WITH DIFFERENTIAL (CANCER CENTER ONLY)
Abs Immature Granulocytes: 0.02 10*3/uL (ref 0.00–0.07)
Basophils Absolute: 0 10*3/uL (ref 0.0–0.1)
Basophils Relative: 1 %
Eosinophils Absolute: 0.1 10*3/uL (ref 0.0–0.5)
Eosinophils Relative: 1 %
HCT: 40.5 % (ref 39.0–52.0)
Hemoglobin: 14.2 g/dL (ref 13.0–17.0)
Immature Granulocytes: 0 %
Lymphocytes Relative: 9 %
Lymphs Abs: 0.6 10*3/uL — ABNORMAL LOW (ref 0.7–4.0)
MCH: 31.6 pg (ref 26.0–34.0)
MCHC: 35.1 g/dL (ref 30.0–36.0)
MCV: 90 fL (ref 80.0–100.0)
Monocytes Absolute: 0.3 10*3/uL (ref 0.1–1.0)
Monocytes Relative: 5 %
Neutro Abs: 5.4 10*3/uL (ref 1.7–7.7)
Neutrophils Relative %: 84 %
Platelet Count: 171 10*3/uL (ref 150–400)
RBC: 4.5 MIL/uL (ref 4.22–5.81)
RDW: 12.6 % (ref 11.5–15.5)
WBC Count: 6.5 10*3/uL (ref 4.0–10.5)
nRBC: 0 % (ref 0.0–0.2)

## 2023-06-05 MED ORDER — HEPARIN SOD (PORK) LOCK FLUSH 100 UNIT/ML IV SOLN
INTRAVENOUS | Status: AC
  Administered 2023-06-05: 500 [IU] via INTRAVENOUS
  Filled 2023-06-05: qty 5

## 2023-06-05 MED ORDER — SODIUM CHLORIDE 0.9% FLUSH
10.0000 mL | Freq: Once | INTRAVENOUS | Status: AC
  Administered 2023-06-05: 10 mL via INTRAVENOUS
  Filled 2023-06-05: qty 10

## 2023-06-05 MED ORDER — HEPARIN SOD (PORK) LOCK FLUSH 100 UNIT/ML IV SOLN
500.0000 [IU] | Freq: Once | INTRAVENOUS | Status: AC
  Filled 2023-06-05: qty 5

## 2023-06-05 NOTE — Assessment & Plan Note (Signed)
Bilateral pulmonary emboli and RLE DVT - 01/2021 Continue Eliquis to 2.5 mg twice daily for prophylaxis.  

## 2023-06-05 NOTE — Progress Notes (Signed)
Hematology/Oncology Progress note [phone] [phone]       Chief Complaint: Sean Lodge. is a 77 y.o. 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   Assessment/Plan.   Cancer Staging  Carcinoma of base of tongue (HCC) Staging form: Pharynx - HPV-Mediated Oropharynx, AJCC 8th Edition - Clinical stage from 10/11/2020: Stage I (cT1, cN1, cM0, p16+) - Signed by Rickard Patience, MD on 04/28/2022   Carcinoma of base of tongue (HCC) Stage I right base of tongue carcinoma,+P16   01/11/2021. completed concurrent radiation and cisplatin  Recommend patient to follow-up  with ENT.  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  10/29/2021, EGD showed no invasive esophagus carcinoma.  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 Previously discussed with [name] surgery Dr.D'Amico who recommends to repeat EGD every 3 months for the first year followed by EGD every 6 months for the second year after treatments If persistent high-grade dysplastic changes., he recommends radiofrequency ablation.  04/01/2023 S/p radiofrequency ablation Continue follow up with GI and EGD surveillance and ablation if needed.  CT images were reviewed and discussed with patient. Previous lung nodules resolved. New 4mm lung nodule Repeat CT in 3 months.    Pulmonary embolism, bilateral (HCC) Bilateral pulmonary emboli and RLE DVT - 01/2021 Continue Eliquis to 2.5 mg twice daily for prophylaxis.     Orders Placed This Encounter  Procedures   CT SOFT TISSUE NECK WO CONTRAST    Standing Status:   Future    Standing Expiration Date:   06/04/2024    Order Specific Question:   Preferred imaging location?    Answer:   [facility]   CT CHEST  ABDOMEN PELVIS WO CONTRAST    Standing Status:   Future    Standing Expiration Date:   06/04/2024    Order Specific Question:   Preferred imaging location?    Answer:   [facility]    Order Specific Question:   If indicated for the ordered procedure, I authorize the administration of oral contrast media per Radiology protocol    Answer:   Yes    Order Specific Question:   Does the patient have a contrast media/X-ray dye allergy?    Answer:   No   CBC with Differential (Cancer Center Only)    Standing Status:   Future    Standing Expiration Date:   06/04/2024   CMP (Cancer Center only)    Standing Status:   Future    Standing Expiration Date:   06/04/2024   CEA    Standing Status:   Future    Standing Expiration Date:   06/04/2024     I discussed the assessment and treatment plan with the patient.  The patient was provided an opportunity to ask questions and all were answered.  The patient agreed with the plan and demonstrated an understanding of the instructions.  The patient was advised to call back if the symptoms worsen or if the condition fails to improve as anticipated.   Rickard Patience, MD, PhD [facility] Hematology Oncology 06/05/2023       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HCC)  10/11/2020 Cancer Staging   Staging form: Pharynx - HPV-Mediated Oropharynx, AJCC 8th Edition - Clinical stage from 10/11/2020: Stage I (cT1, cN1, cM0, p16+) - Signed by Rickard Patience, MD on 04/28/2022 Stage prefix: Initial diagnosis Stage used in treatment planning: Yes National guidelines used in treatment planning: Yes   10/17/2020 Initial Diagnosis   Carcinoma of base of tongue (HCC)   11/20/2020 - 12/27/2020 Chemotherapy   11/20/2020 - 12/27/2020  6 weeks of cisplatin with concurrent radiation        05/23/2021 -  07/04/2021 Chemotherapy   Patient is on Treatment Plan : ESOPHAGUS Carboplatin/PACLitaxel weekly x 6 weeks with XRT       Esophageal cancer, stage IB (HCC)  10/25/2020 Imaging   PET scan on 10/25/2020 revealed right tongue base primary with ipsilateral level 2 nodal metastasis. There were no findings of extracervical metastatic disease. There was focal hypermetabolism in the distal esophagus, with possible concurrent soft tissue density lesion. Recommended correlation with endoscopy. There was vague right lower lobe low-level hypermetabolism and ground-glass nodularity, favored minimal infection or aspiration. Incidental findings included aortic atherosclerosis, coronary artery atherosclerosis, emphysema, a tiny hiatal hernia, and prostatomegaly.   11/03/2020 Miscellaneous   11/03/2020 Upper endoscopy on  revealed a malignant esophageal tumor at the gastroesophageal junction. There were esophageal mucosal changes c/w short-segment Barrett's esophagus. There was a 2 cm hiatal hernia. There was gastritis.  Pathology in the esophagus at 36 cm revealed intramucosal adenocarcinoma arising in a background of high grade dysplasia and intestinal metaplasia; pathology at 38 cm revealed adenocarcinoma.   11/15/2020 EUS on  revealed early stage adenocarcinoma arising from Barrett's esophagus.  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      11/16/2020 Initial Diagnosis   Esophageal cancer, stage IB (HCC)   04/09/2021 Miscellaneous   04/09/2021, patient was seen by Dr. Ewing Schlein and underwent EGD. Polypoid tumor was seen at the GE junction, consistent with the location of the previously visualized tumor.  With no significant difference compared to photos from other endoscopies.  The proximal esophagus is involved.  Esophagectomy will be applicable. Biopsy was taken at 38 cm.  Dr. Ewing Schlein has reached out radiation oncology Dr. Rushie Chestnut and recommend neoadjuvant chemo and  radiation.   05/23/2021 Concurrent Chemotherapy   05/23/2021-07/04/2021 weekly Carboplatin/ taxol  + RT finished 07/09/2021   09/12/2021 Imaging    PET showed no signs of increased hypermetabolic activity in the neck to indicate residual head neck cancer.  No hypermetabolism in the esophagus. No signs of metastasis to the neck, chest, abdomen or pelvis.   10/29/2021 Miscellaneous   EGD showed no evidence of residual esophageal tumor today.  Esophageal mucosal changes consistent with short segment Barrett's esophagus.  Biopsied.  2 cm hiatal hernia.  A single gastric polyp, biopsied.  Pathology showed gastric hyperplastic polyps, Barrett's esophagus, low-grade dysplasia, focally suspicious for high-grade dysplasia.  Evidence of invasive carcinoma was not seen in the submitted biopsies   01/28/2022 Miscellaneous   EGD showed short segment Barrett's disease, distal esophagus at 37 cm biopsy showed low-grade dysplastic and focal high-grade dysplasia.   04/08/2022 Miscellaneous   EGD showed no gross lesions in esophagus approximately.  Esophageal mucosal changes secondary to established long segment Barrett's disease distantly.  Treated with radiofrequency ablation. 4 cm hiatal hernia.  Erythematous mucosa in the stomach.  No gross lesions in the duodenal bulb, first portion of the duodenum and in the second portion of duodenum.   09/12/2022 Miscellaneous   Repeat  EGD showed no suspicious lesions.  Patient underwent additional ablation.   12/17/2022 Imaging   PET scan showed 1. Interval resolution of the hypermetabolism seen in the distal esophagus on the previous study. Uptake in the distal esophagus today is below blood pool levels. 2. No evidence for hypermetabolic metastatic disease in the neck, chest, abdomen, or pelvis. 3. 4-5 mm posterior right middle lobe pulmonary nodule is new in the interval. Nonspecific on this non breath hold exam, dedicated CT chest without contrast recommended for more  definitive characterization. 4. Left colonic diverticulosis without diverticulitis. 5. Small right groin hernia contains only fat. 6.  Aortic Atherosclerosis    Imaging      Imaging   CT chest wo contrast  1. 4-5 mm posterior right middle lobe pulmonary nodule identified as new on the prior study has resolved in the interval. 2. Interval development of 3 new nodular ground-glass opacities in the left upper lobe measuring up to 1.6 x 0.7 cm. These are likely infectious/inflammatory. Non-contrast chest CT at 3-6 months is recommended. If the ground glass opacities persist, subsequent management will be based upon the most suspicious findings. This recommendation follows the consensus statement: Guidelines for Management of Incidental Pulmonary Nodules Detected on CT Images: From the Fleischner Society 2017; Radiology 2017; 281: 228-243. 3. Tiny hiatal hernia. 4.  Aortic Atherosclerosis   Esophageal adenocarcinoma (HCC)  04/21/2021 Initial Diagnosis   Esophageal adenocarcinoma (HCC)   05/23/2021 - 07/04/2021 Chemotherapy   Patient is on Treatment Plan : ESOPHAGUS Carboplatin/PACLitaxel weekly x 6 weeks with XRT          INTERVAL HISTORY Sean Garner. is a 77 y.o. 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  He reports feeling well.  Appetite is fair. Weight is stable. Denies any swallowing difficulty, nausea vomiting, epigastric pain.  cough has improved    Past Medical History:  Diagnosis Date   Anxiety    Cancer (HCC)    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    Hypercholesterolemia    Hypertension     Past Surgical History:  Procedure Laterality Date   CATARACT EXTRACTION W/PHACO Left 12/03/2017   Procedure: CATARACT EXTRACTION PHACO AND INTRAOCULAR LENS PLACEMENT (IOC) COMPLICATED DIABETIC LEFT;  Surgeon: Lockie Mola, MD;  Location: [facility];  Service: Ophthalmology;  Laterality: Left;  Diabetic - oral meds   CHOLECYSTECTOMY     COLONOSCOPY  2016   ESOPHAGOGASTRODUODENOSCOPY (EGD) WITH PROPOFOL N/A 04/08/2022   Procedure: ESOPHAGOGASTRODUODENOSCOPY (EGD) WITH PROPOFOL;  Surgeon: Meridee Score Netty Starring., MD;  Location: [facility];  Service: Gastroenterology;  Laterality: N/A;   ESOPHAGOGASTRODUODENOSCOPY (EGD) WITH PROPOFOL N/A 09/12/2022   Procedure: ESOPHAGOGASTRODUODENOSCOPY (EGD) WITH PROPOFOL;  Surgeon: Meridee Score Netty Starring., MD;  Location: [facility];  Service: Gastroenterology;  Laterality: N/A;   ESOPHAGOGASTRODUODENOSCOPY (EGD) WITH PROPOFOL N/A 04/01/2023   Procedure: ESOPHAGOGASTRODUODENOSCOPY (EGD) WITH PROPOFOL;  Surgeon: Meridee Score Netty Starring., MD;  Location: [facility];  Service: Gastroenterology;  Laterality: N/A;   GI RADIOFREQUENCY ABLATION  04/08/2022   Procedure: GI RADIOFREQUENCY ABLATION;  Surgeon: Lemar Lofty., MD;  Location: [facility];  Service: Gastroenterology;;   GI RADIOFREQUENCY ABLATION N/A 09/12/2022   Procedure: GI RADIOFREQUENCY ABLATION;  Surgeon: Lemar Lofty., MD;  Location: [facility];  Service: Gastroenterology;  Laterality: N/A;   GI RADIOFREQUENCY ABLATION  04/01/2023   Procedure: GI RADIOFREQUENCY ABLATION;  Surgeon: Meridee Score Netty Starring., MD;  Location: [facility];  Service: Gastroenterology;;   PORTA CATH INSERTION N/A 11/13/2020   Procedure: PORTA CATH INSERTION;  Surgeon: Annice Needy, MD;  Location: [facility];  Service: Cardiovascular;  Laterality: N/A;    Family History  Problem Relation Age of Onset   Cancer Mother    Colon cancer  Neg Hx    Colon polyps Neg Hx    Esophageal cancer Neg Hx    Rectal cancer Neg Hx    Stomach cancer Neg Hx    Inflammatory bowel disease Neg Hx    Liver disease Neg Hx    Pancreatic cancer Neg Hx     Social History:  reports that he quit smoking about 38 years ago. His smoking use included cigarettes. He has never used smokeless tobacco. He reports that he does not currently use alcohol. He reports that he does not use drugs. He quit smoking cold [place] in 1986. He smoked 2 packs per day for 20 years. 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 He was exposed to [employer]. He worked with a Architect for 34 years. The patient is accompanied by his wife today.  Allergies: No Known Allergies  Current Medications: Current Outpatient Medications  Medication Sig Dispense Refill   acetaminophen-codeine 120-12 MG/5ML solution Take 10 mLs by mouth every 6 (six) hours as needed for moderate pain. 120 mL 0   AMBULATORY NON FORMULARY MEDICATION Take 30 mLs by mouth in the morning, at noon, in the evening, and at bedtime. Medication Name: GI cocktail  90 ml viscous lidocaine 90 ml 10 mg/5 ml dicyclomine 270 ml maalox  Swish and swallow 450 mL 2   apixaban (ELIQUIS) 2.5 MG TABS tablet Take 1 tablet (2.5 mg total) by mouth 2 (two) times daily. 60 tablet 5   betamethasone valerate lotion (VALISONE) 0.1 % Apply 1 application  topically 2 (two) times daily as needed (dry scalp).     brimonidine-timolol (COMBIGAN) 0.2-0.5 % ophthalmic solution Place 1 drop into the left eye 2 (two) times daily.     Calcium Carb-Cholecalciferol (CALCIUM 600 + D PO) Take 1 tablet by mouth daily.     cholecalciferol (VITAMIN D3) 25 MCG (1000 UNIT) tablet Take 1,000 Units by mouth daily.     citalopram (CELEXA) 10 MG tablet TAKE 1 TABLET BY MOUTH EVERY DAY 90 tablet 1   glucose blood test strip      loratadine (CLARITIN) 10 MG tablet Take 10 mg by mouth daily with lunch.     losartan (COZAAR) 25 MG tablet  Take 25 mg by mouth 2 (two) times daily.     Magnesium 400 MG TABS Take 400 mg by mouth daily.     metFORMIN (GLUCOPHAGE-XR) 500 MG 24 hr tablet Take 500 mg by mouth 2 (two) times daily.     Multiple Vitamin (MULTIVITAMIN) tablet Take 1 tablet by mouth daily.     pantoprazole (PROTONIX) 40 MG tablet TAKE 1 TABLET (40 MG TOTAL) BY MOUTH TWICE A DAY BEFORE MEALS 180 tablet 2   Phenylephrine HCl (4-WAY FAST ACTING NA) Place 1 spray into the nose daily  as needed (congestion).     simvastatin (ZOCOR) 40 MG tablet Take 40 mg by mouth daily at 6 PM.     sucralfate (CARAFATE) 1 g tablet Take 1 tablet (1 g total) by mouth 4 (four) times daily. 120 tablet 1   traZODone (DESYREL) 50 MG tablet TAKE 1 TABLET BY MOUTH AT BEDTIME AS NEEDED FOR SLEEP 90 tablet 1   vitamin B-12 (CYANOCOBALAMIN) 1000 MCG tablet Take 1,000 mcg by mouth daily.     Current Facility-Administered Medications  Medication Dose Route Frequency Provider Last Rate Last Admin   0.9 %  sodium chloride infusion  500 mL Intravenous Once Pyrtle, Carie Caddy, MD       Facility-Administered Medications Ordered in Other Visits  Medication Dose Route Frequency Provider Last Rate Last Admin   0.9 % NaCl with KCl 20 mEq/ L  infusion   Intravenous Once Corcoran, Melissa C, MD       heparin lock flush 100 UNIT/ML injection            heparin lock flush 100 unit/mL  500 Units Intravenous Once Rickard Patience, MD       magnesium sulfate IVPB 2 g 50 mL  2 g Intravenous Once Corcoran, Melissa C, MD       sodium chloride flush (NS) 0.9 % injection 10 mL  10 mL Intravenous Once Rickard Patience, MD        Review of Systems  Constitutional: Negative.  Negative for chills, fever, malaise/fatigue and weight loss.  HENT:  Negative for congestion, ear pain and tinnitus.   Eyes: Negative.  Negative for blurred vision and double vision.  Respiratory: Negative.  Negative for cough, sputum production and shortness of breath.   Cardiovascular:  Negative for chest pain and  palpitations.  Gastrointestinal: Negative.  Negative for abdominal pain, constipation, diarrhea, nausea and vomiting.  Genitourinary:  Negative for dysuria, frequency and urgency.  Musculoskeletal:  Negative for back pain and falls.  Skin: Negative.  Negative for rash.  Neurological: Negative.  Negative for weakness and headaches.  Endo/Heme/Allergies: Negative.  Does not bruise/bleed easily.  Psychiatric/Behavioral: Negative.  Negative for depression. The patient is not nervous/anxious and does not have insomnia.    Performance status (ECOG): 1  Vitals Blood pressure (!) 148/78, pulse (!) 59, temperature (!) 96.8 F (36 C), temperature source Tympanic, resp. rate 18, weight 198 lb 8 oz (90 kg), SpO2 95%.    Physical Exam Constitutional:      General: He is not in acute distress. HENT:     Head: Normocephalic.     Nose: Nose normal.     Mouth/Throat:     Pharynx: No oropharyngeal exudate.  Eyes:     General: No scleral icterus.    Pupils: Pupils are equal, round, and reactive to light.  Cardiovascular:     Rate and Rhythm: Normal rate.     Heart sounds: No murmur heard. Pulmonary:     Effort: Pulmonary effort is normal. No respiratory distress.     Breath sounds: No wheezing.  Abdominal:     General: Bowel sounds are normal. There is no distension.     Palpations: Abdomen is soft.  Musculoskeletal:        General: Normal range of motion.     Cervical back: Normal range of motion.  Skin:    General: Skin is warm and dry.     Findings: No erythema.  Neurological:     Mental Status: He is alert and oriented  to person, place, and time. Mental status is at baseline.     Cranial Nerves: No cranial nerve deficit.     Motor: No abnormal muscle tone.  Psychiatric:        Mood and Affect: Mood and affect normal.     Labs    Latest Ref Rng & Units 06/05/2023   10:00 AM 03/25/2023    9:59 AM 10/07/2022   12:50 PM  CBC  WBC 4.0 - 10.5 K/uL 6.5  4.9  5.2   Hemoglobin 13.0 -  17.0 g/dL 26.9  48.5  46.2   Hematocrit 39.0 - 52.0 % 40.5  41.1  39.0   Platelets 150 - 400 K/uL 171  155  183       Latest Ref Rng & Units 06/05/2023   10:00 AM 03/25/2023    9:59 AM 10/07/2022   12:50 PM  CMP  Glucose 70 - 99 mg/dL 703  500  938   BUN 8 - 23 mg/dL 21  15  15    Creatinine 0.61 - 1.24 mg/dL 1.82  9.93  7.16   Sodium 135 - 145 mmol/L 136  135  135   Potassium 3.5 - 5.1 mmol/L 3.6  3.8  3.8   Chloride 98 - 111 mmol/L 102  101  100   CO2 22 - 32 mmol/L 25  26  30    Calcium 8.9 - 10.3 mg/dL 8.7  8.8  8.9   Total Protein 6.5 - 8.1 g/dL 6.7  6.5  6.7   Total Bilirubin 0.3 - 1.2 mg/dL 0.8  0.9  0.6   Alkaline Phos 38 - 126 U/L 48  51  53   AST 15 - 41 U/L 19  21  27    ALT 0 - 44 U/L 21  19  24

## 2023-06-05 NOTE — Assessment & Plan Note (Addendum)
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  10/29/2021, EGD showed no invasive esophagus carcinoma.  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 Previously discussed with [name] surgery Dr.D'Amico who recommends to repeat EGD every 3 months for the first year followed by EGD every 6 months for the second year after treatments If persistent high-grade dysplastic changes., he recommends radiofrequency ablation.  04/01/2023 S/p radiofrequency ablation Continue follow up with GI and EGD surveillance and ablation if needed.  CT images were reviewed and discussed with patient. Previous lung nodules resolved. New 4mm lung nodule Repeat CT in 3 months.

## 2023-06-05 NOTE — Assessment & Plan Note (Addendum)
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 completed concurrent radiation and cisplatin  Recommend patient to follow-up  with ENT.

## 2023-06-06 ENCOUNTER — Ambulatory Visit: Payer: Medicare Other | Admitting: Oncology

## 2023-06-06 LAB — CEA: CEA: 0.8 ng/mL (ref 0.0–4.7)

## 2023-07-14 ENCOUNTER — Inpatient Hospital Stay: Payer: Medicare Other

## 2023-07-28 ENCOUNTER — Encounter: Payer: Self-pay | Admitting: Radiation Oncology

## 2023-07-28 ENCOUNTER — Ambulatory Visit
Admission: RE | Admit: 2023-07-28 | Discharge: 2023-07-28 | Disposition: A | Discharge status: Home / Self Care | Payer: Medicare Other | Source: Ambulatory Visit | Attending: Radiation Oncology | Admitting: Radiation Oncology

## 2023-07-28 VITALS — BP 155/70 | HR 56 | Temp 96.9°F | Resp 16 | Ht 70.0 in | Wt 200.1 lb

## 2023-07-28 DIAGNOSIS — C160 Malignant neoplasm of cardia: Secondary | ICD-10-CM | POA: Insufficient documentation

## 2023-07-28 DIAGNOSIS — Z86711 Personal history of pulmonary embolism: Secondary | ICD-10-CM | POA: Insufficient documentation

## 2023-07-28 DIAGNOSIS — Z7901 Long term (current) use of anticoagulants: Secondary | ICD-10-CM | POA: Insufficient documentation

## 2023-07-28 DIAGNOSIS — Z85810 Personal history of malignant neoplasm of tongue: Secondary | ICD-10-CM | POA: Diagnosis not present

## 2023-07-28 DIAGNOSIS — Z923 Personal history of irradiation: Secondary | ICD-10-CM | POA: Diagnosis not present

## 2023-07-28 DIAGNOSIS — C01 Malignant neoplasm of base of tongue: Secondary | ICD-10-CM

## 2023-07-28 NOTE — Progress Notes (Signed)
Radiation Oncology Follow up Note  Name: Sean Garner.   Date:   07/28/2023 MRN:  [id number] DOB: 01/15/1946    This 77 y.o. 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  Seen today in routine follow-up he continues to do quite well.  He is still having some ablative therapy with GI although no malignancy has been detected.  He is swallowing well.  He is having no head and neck pain.  He also has been seeing.  [name] patient is currently on Eliquis for bilateral pulmonary emboli.  He had a PET/CT scan back in January showing no evidence of disease.  Recent chest CT scan showed interval resolution of previous ground glass nodules in the left upper lobe does have a nonspecific a 4 mm nodule in the superior segment of the right lower lobe which is being followed. COMPLICATIONS OF TREATMENT: none  FOLLOW UP COMPLIANCE: keeps appointments   PHYSICAL EXAM:  BP (!) 155/70   Pulse (!) 56   Temp (!) 96.9 F (36.1 C)   Resp 16   Ht 5\' 10"  (1.778 m)   Wt 200 lb 1.6 oz (90.8 kg)   BMI 28.71 kg/m  Oral cavity is clear no oral mucosal lesions are identified neck is clear without evidence of cervical or supraclavicular adenopathy.  Well-developed well-nourished patient in NAD. HEENT reveals PERLA, EOMI, discs not visualized.  Oral cavity is clear. No oral mucosal lesions are identified. Neck is clear without evidence of cervical or supraclavicular adenopathy. Lungs are clear to A&P. Cardiac examination is essentially unremarkable with regular rate and rhythm without murmur rub or thrill.  Abdomen is benign with no organomegaly or masses noted. Motor sensory and DTR levels are equal and symmetric in the upper and lower extremities. Cranial nerves II through XII are grossly intact. Proprioception is intact. No peripheral adenopathy or edema is identified. No motor or sensory levels are noted. Crude visual fields are within normal range.  RADIOLOGY RESULTS: PET CT and CT scans reviewed compatible with above-stated findings  PLAN: Present time he continues close follow-up care with medical oncology.  He has a CT scan chest abdomen pelvis as well as soft tissue of neck in October of asked him to copy me those results.  He continues I think 1 more ablative upper endoscopy with GI.  Otherwise and pleased with his overall progress he has no evidence of disease at this point time of asked to see him back in 1 year for follow-up.  Patient knows to call sooner with any concerns.  I would like to take this opportunity to thank you for allowing me to participate in the care of your patient.Sean Miller, MD

## 2023-08-17 ENCOUNTER — Other Ambulatory Visit: Payer: Self-pay | Admitting: Psychiatry

## 2023-08-17 DIAGNOSIS — F5101 Primary insomnia: Secondary | ICD-10-CM

## 2023-08-17 DIAGNOSIS — F411 Generalized anxiety disorder: Secondary | ICD-10-CM

## 2023-09-01 ENCOUNTER — Other Ambulatory Visit: Payer: Self-pay

## 2023-09-01 ENCOUNTER — Telehealth: Payer: Self-pay | Admitting: Gastroenterology

## 2023-09-01 DIAGNOSIS — K22711 Barrett's esophagus with high grade dysplasia: Secondary | ICD-10-CM

## 2023-09-01 NOTE — Telephone Encounter (Signed)
EGD has been set up for 11/10/23 at 2 pm at [facility] with GM   Left message on machine to call back

## 2023-09-01 NOTE — Telephone Encounter (Signed)
PT is calling to have EGD scheduled. Last one was May 2024 and it had on post op to reschedule another one in 3-4 months. Please advise.

## 2023-09-02 NOTE — Telephone Encounter (Signed)
The pt wanted to make sure that the procedure would be precerted.  I did make him aware that we will get prior auth however, if he needs out of pocket he should call his insurance. The pt has been advised of the information and verbalized understanding.

## 2023-09-02 NOTE — Telephone Encounter (Signed)
Left message on machine to call back  

## 2023-09-02 NOTE — Telephone Encounter (Signed)
Inbound call from patient in regards to scheduled procedure. Requesting to speak with a nurse. Please advise.

## 2023-09-02 NOTE — Telephone Encounter (Signed)
EGD scheduled, pt instructed and medications reviewed.  Patient instructions mailed to home and sent to My chart .  Patient to call with any questions or concerns.

## 2023-09-03 NOTE — Telephone Encounter (Signed)
The request to cancel the appt until after the first of the year.  He will call back when he is ready to proceed.  I have cancelled the appt.

## 2023-09-03 NOTE — Telephone Encounter (Signed)
PT is calling to have his EGD pushed out until January. Please advise.

## 2023-09-05 ENCOUNTER — Ambulatory Visit: Payer: Medicare Other

## 2023-09-05 ENCOUNTER — Ambulatory Visit
Admission: RE | Admit: 2023-09-05 | Discharge: 2023-09-05 | Disposition: A | Discharge status: Home / Self Care | Payer: Medicare Other | Source: Ambulatory Visit | Attending: Oncology | Admitting: Oncology

## 2023-09-05 DIAGNOSIS — C01 Malignant neoplasm of base of tongue: Secondary | ICD-10-CM | POA: Insufficient documentation

## 2023-09-05 DIAGNOSIS — C159 Malignant neoplasm of esophagus, unspecified: Secondary | ICD-10-CM | POA: Diagnosis present

## 2023-09-11 ENCOUNTER — Inpatient Hospital Stay: Payer: Medicare Other | Attending: Oncology

## 2023-09-11 ENCOUNTER — Encounter: Payer: Self-pay | Admitting: Oncology

## 2023-09-11 ENCOUNTER — Inpatient Hospital Stay (HOSPITAL_BASED_OUTPATIENT_CLINIC_OR_DEPARTMENT_OTHER): Payer: Medicare Other | Admitting: Oncology

## 2023-09-11 VITALS — BP 156/64 | HR 55 | Temp 96.0°F | Resp 18 | Wt 199.7 lb

## 2023-09-11 DIAGNOSIS — C159 Malignant neoplasm of esophagus, unspecified: Secondary | ICD-10-CM

## 2023-09-11 DIAGNOSIS — Z7901 Long term (current) use of anticoagulants: Secondary | ICD-10-CM | POA: Diagnosis not present

## 2023-09-11 DIAGNOSIS — I2699 Other pulmonary embolism without acute cor pulmonale: Secondary | ICD-10-CM | POA: Diagnosis not present

## 2023-09-11 DIAGNOSIS — C160 Malignant neoplasm of cardia: Secondary | ICD-10-CM | POA: Insufficient documentation

## 2023-09-11 DIAGNOSIS — Z79899 Other long term (current) drug therapy: Secondary | ICD-10-CM | POA: Insufficient documentation

## 2023-09-11 DIAGNOSIS — Z86711 Personal history of pulmonary embolism: Secondary | ICD-10-CM | POA: Diagnosis not present

## 2023-09-11 DIAGNOSIS — C01 Malignant neoplasm of base of tongue: Secondary | ICD-10-CM

## 2023-09-11 DIAGNOSIS — Z9221 Personal history of antineoplastic chemotherapy: Secondary | ICD-10-CM | POA: Diagnosis not present

## 2023-09-11 DIAGNOSIS — Z95828 Presence of other vascular implants and grafts: Secondary | ICD-10-CM

## 2023-09-11 DIAGNOSIS — Z923 Personal history of irradiation: Secondary | ICD-10-CM | POA: Diagnosis not present

## 2023-09-11 DIAGNOSIS — Z87891 Personal history of nicotine dependence: Secondary | ICD-10-CM | POA: Diagnosis not present

## 2023-09-11 LAB — CMP (CANCER CENTER ONLY)
ALT: 21 U/L (ref 0–44)
AST: 19 U/L (ref 15–41)
Albumin: 3.7 g/dL (ref 3.5–5.0)
Alkaline Phosphatase: 56 U/L (ref 38–126)
Anion gap: 6 (ref 5–15)
BUN: 12 mg/dL (ref 8–23)
CO2: 26 mmol/L (ref 22–32)
Calcium: 8.4 mg/dL — ABNORMAL LOW (ref 8.9–10.3)
Chloride: 101 mmol/L (ref 98–111)
Creatinine: 0.94 mg/dL (ref 0.61–1.24)
GFR, Estimated: >60 mL/min (ref >60)
Glucose, Bld: 317 mg/dL — ABNORMAL HIGH (ref 70–99)
Potassium: 3.7 mmol/L (ref 3.5–5.1)
Sodium: 133 mmol/L — ABNORMAL LOW (ref 135–145)
Total Bilirubin: 0.7 mg/dL (ref 0.3–1.2)
Total Protein: 6.3 g/dL — ABNORMAL LOW (ref 6.5–8.1)

## 2023-09-11 LAB — CBC WITH DIFFERENTIAL (CANCER CENTER ONLY)
Abs Immature Granulocytes: 0.03 10*3/uL (ref 0.00–0.07)
Basophils Absolute: 0 10*3/uL (ref 0.0–0.1)
Basophils Relative: 1 %
Eosinophils Absolute: 0.1 10*3/uL (ref 0.0–0.5)
Eosinophils Relative: 1 %
HCT: 38.5 % — ABNORMAL LOW (ref 39.0–52.0)
Hemoglobin: 13.4 g/dL (ref 13.0–17.0)
Immature Granulocytes: 1 %
Lymphocytes Relative: 12 %
Lymphs Abs: 0.5 10*3/uL — ABNORMAL LOW (ref 0.7–4.0)
MCH: 31.7 pg (ref 26.0–34.0)
MCHC: 34.8 g/dL (ref 30.0–36.0)
MCV: 91 fL (ref 80.0–100.0)
Monocytes Absolute: 0.3 10*3/uL (ref 0.1–1.0)
Monocytes Relative: 7 %
Neutro Abs: 3.4 10*3/uL (ref 1.7–7.7)
Neutrophils Relative %: 78 %
Platelet Count: 184 10*3/uL (ref 150–400)
RBC: 4.23 MIL/uL (ref 4.22–5.81)
RDW: 12.7 % (ref 11.5–15.5)
WBC Count: 4.3 10*3/uL (ref 4.0–10.5)
nRBC: 0 % (ref 0.0–0.2)

## 2023-09-11 MED ORDER — HEPARIN SOD (PORK) LOCK FLUSH 100 UNIT/ML IV SOLN
500.0000 [IU] | Freq: Once | INTRAVENOUS | Status: AC
  Administered 2023-09-11: 500 [IU] via INTRAVENOUS
  Filled 2023-09-11: qty 5

## 2023-09-11 MED ORDER — SODIUM CHLORIDE 0.9% FLUSH
10.0000 mL | Freq: Once | INTRAVENOUS | Status: AC
  Administered 2023-09-11: 10 mL via INTRAVENOUS
  Filled 2023-09-11: qty 10

## 2023-09-11 NOTE — Assessment & Plan Note (Addendum)
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 completed concurrent radiation and cisplatin  CT neck showed non specific thickening of epiglottis.he has no clinical signs of epiglottitis. -observation.  Recommend patient to follow-up  with ENT.

## 2023-09-11 NOTE — Assessment & Plan Note (Addendum)
Continue port flush every 6-8 weeks.  

## 2023-09-11 NOTE — Assessment & Plan Note (Signed)
Bilateral pulmonary emboli and RLE DVT - 01/2021 Continue Eliquis to 2.5 mg twice daily for prophylaxis.  

## 2023-09-11 NOTE — Assessment & Plan Note (Addendum)
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  10/29/2021, EGD showed no invasive esophagus carcinoma.  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 04/01/2023 S/p radiofrequency ablation Continue follow up with GI and EGD surveillance and ablation if needed.  CT images were reviewed and discussed with patient. - no recurrence.  New focal area of ground glass opacity, -observation.  Repeat CT in 6 months.

## 2023-09-11 NOTE — Progress Notes (Signed)
Hematology/Oncology Progress note [phone] [phone]       Chief Complaint: Sean Lodge. is a 77 y.o. 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   Assessment/Plan.   Cancer Staging  Carcinoma of base of tongue (HCC) Staging form: Pharynx - HPV-Mediated Oropharynx, AJCC 8th Edition - Clinical stage from 10/11/2020: Stage I (cT1, cN1, cM0, p16+) - Signed by Rickard Patience, MD on 04/28/2022   Carcinoma of base of tongue (HCC) Stage I right base of tongue carcinoma,+P16   01/11/2021. completed concurrent radiation and cisplatin  CT neck showed non specific thickening of epiglottis.he has no clinical signs of epiglottitis. -observation.  Recommend patient to follow-up  with ENT.  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  10/29/2021, EGD showed no invasive esophagus carcinoma.  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 04/01/2023 S/p radiofrequency ablation Continue follow up with GI and EGD surveillance and ablation if needed.  CT images were reviewed and discussed with patient. - no recurrence.  New focal area of ground glass opacity, -observation.  Repeat CT in 6 months.   Pulmonary embolism, bilateral (HCC) Bilateral pulmonary emboli and RLE DVT - 01/2021 Continue Eliquis to 2.5 mg twice daily for prophylaxis.   Port-A-Cath in place Continue port flush every 6- 8 weeks.     Orders Placed This Encounter  Procedures   CT SOFT TISSUE NECK W CONTRAST    Standing Status:   Future    Standing Expiration Date:   09/10/2024    Order Specific Question:   If indicated for the ordered procedure, I authorize the administration of contrast media per Radiology protocol    Answer:   Yes    Order Specific  Question:   Does the patient have a contrast media/X-ray dye allergy?    Answer:   No    Order Specific Question:   Preferred imaging location?    Answer:   [facility]   CT CHEST ABDOMEN PELVIS W CONTRAST    Standing Status:   Future    Standing Expiration Date:   09/10/2024    Order Specific Question:   If indicated for the ordered procedure, I authorize the administration of contrast media per Radiology protocol    Answer:   Yes    Order Specific Question:   Does the patient have a contrast media/X-ray dye allergy?    Answer:   No    Order Specific Question:   Preferred imaging location?    Answer:   [facility]    Order Specific Question:   If indicated for the ordered procedure, I authorize the administration of oral contrast media per Radiology protocol    Answer:   Yes   CMP (Cancer Center only)    Standing Status:   Future    Standing Expiration Date:   09/10/2024   CEA    Standing Status:   Future    Standing Expiration Date:   09/10/2024   CBC with Differential (Cancer Center Only)    Standing Status:   Future    Standing Expiration Date:   09/10/2024     I discussed the assessment and treatment plan with the patient.  The patient was provided an opportunity to ask questions and all were answered.  The patient agreed with the plan and  demonstrated an understanding of the instructions.  The patient was advised to call back if the symptoms worsen or if the condition fails to improve as anticipated.   Rickard Patience, MD, PhD [facility] Hematology Oncology 09/11/2023       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HCC)  10/11/2020 Cancer Staging   Staging form: Pharynx - HPV-Mediated Oropharynx, AJCC 8th Edition - Clinical stage from 10/11/2020: Stage I (cT1, cN1, cM0, p16+) - Signed by Rickard Patience, MD on 04/28/2022 Stage prefix: Initial diagnosis Stage used in treatment planning: Yes National guidelines used in treatment planning: Yes   10/17/2020 Initial Diagnosis   Carcinoma of base of tongue (HCC)   11/20/2020 - 12/27/2020 Chemotherapy   11/20/2020 - 12/27/2020  6 weeks of cisplatin with concurrent radiation        05/23/2021 - 07/04/2021 Chemotherapy   Patient is on Treatment Plan : ESOPHAGUS Carboplatin/PACLitaxel weekly x 6 weeks with XRT       Esophageal cancer, stage IB (HCC)  10/25/2020 Imaging   PET scan on 10/25/2020 revealed right tongue base primary with ipsilateral level 2 nodal metastasis. There were no findings of extracervical metastatic disease. There was focal hypermetabolism in the distal esophagus, with possible concurrent soft tissue density lesion. Recommended correlation with endoscopy. There was vague right lower lobe low-level hypermetabolism and ground-glass nodularity, favored minimal infection or aspiration. Incidental findings included aortic atherosclerosis, coronary artery atherosclerosis, emphysema, a tiny hiatal hernia, and prostatomegaly.   11/03/2020 Miscellaneous   11/03/2020 Upper endoscopy on  revealed a malignant esophageal tumor at the gastroesophageal junction. There were esophageal mucosal changes c/w short-segment Barrett's esophagus. There was a 2 cm hiatal hernia. There was gastritis.  Pathology in the esophagus at 36 cm revealed intramucosal adenocarcinoma arising in a background of high grade dysplasia and intestinal metaplasia; pathology at 38 cm revealed adenocarcinoma.   11/15/2020 EUS on  revealed early stage adenocarcinoma arising from Barrett's esophagus.  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      11/16/2020 Initial Diagnosis   Esophageal cancer, stage IB (HCC)   04/09/2021 Miscellaneous   04/09/2021, patient was seen by Dr. Ewing Schlein and underwent EGD. Polypoid tumor was seen at the GE  junction, consistent with the location of the previously visualized tumor.  With no significant difference compared to photos from other endoscopies.  The proximal esophagus is involved.  Esophagectomy will be applicable. Biopsy was taken at 38 cm.  Dr. Ewing Schlein has reached out radiation oncology Dr. Rushie Chestnut and recommend neoadjuvant chemo and radiation.   05/23/2021 Concurrent Chemotherapy   05/23/2021-07/04/2021 weekly Carboplatin/ taxol  + RT finished 07/09/2021   09/12/2021 Imaging    PET showed no signs of increased hypermetabolic activity in the neck to indicate residual head neck cancer.  No hypermetabolism in the esophagus. No signs of metastasis to the neck, chest, abdomen or pelvis.   10/29/2021 Miscellaneous   EGD showed no evidence of residual esophageal tumor today.  Esophageal mucosal changes consistent with short segment Barrett's esophagus.  Biopsied.  2 cm hiatal hernia.  A single gastric polyp, biopsied.  Pathology showed gastric hyperplastic polyps, Barrett's esophagus, low-grade dysplasia, focally suspicious for high-grade dysplasia.  Evidence of invasive carcinoma was not seen in the submitted biopsies   01/28/2022 Miscellaneous   EGD showed short segment Barrett's disease, distal esophagus at  37 cm biopsy showed low-grade dysplastic and focal high-grade dysplasia.   04/08/2022 Miscellaneous   EGD showed no gross lesions in esophagus approximately.  Esophageal mucosal changes secondary to established long segment Barrett's disease distantly.  Treated with radiofrequency ablation. 4 cm hiatal hernia.  Erythematous mucosa in the stomach.  No gross lesions in the duodenal bulb, first portion of the duodenum and in the second portion of duodenum.   09/12/2022 Miscellaneous   Repeat EGD showed no suspicious lesions.  Patient underwent additional ablation.   12/17/2022 Imaging   PET scan showed 1. Interval resolution of the hypermetabolism seen in the distal esophagus on the previous  study. Uptake in the distal esophagus today is below blood pool levels. 2. No evidence for hypermetabolic metastatic disease in the neck, chest, abdomen, or pelvis. 3. 4-5 mm posterior right middle lobe pulmonary nodule is new in the interval. Nonspecific on this non breath hold exam, dedicated CT chest without contrast recommended for more definitive characterization. 4. Left colonic diverticulosis without diverticulitis. 5. Small right groin hernia contains only fat. 6.  Aortic Atherosclerosis    Imaging      Imaging   CT chest wo contrast  1. 4-5 mm posterior right middle lobe pulmonary nodule identified as new on the prior study has resolved in the interval. 2. Interval development of 3 new nodular ground-glass opacities in the left upper lobe measuring up to 1.6 x 0.7 cm. These are likely infectious/inflammatory. Non-contrast chest CT at 3-6 months is recommended. If the ground glass opacities persist, subsequent management will be based upon the most suspicious findings. This recommendation follows the consensus statement: Guidelines for Management of Incidental Pulmonary Nodules Detected on CT Images: From the Fleischner Society 2017; Radiology 2017; 281: 228-243. 3. Tiny hiatal hernia. 4.  Aortic Atherosclerosis   Esophageal adenocarcinoma (HCC)  04/21/2021 Initial Diagnosis   Esophageal adenocarcinoma (HCC)   05/23/2021 - 07/04/2021 Chemotherapy   Patient is on Treatment Plan : ESOPHAGUS Carboplatin/PACLitaxel weekly x 6 weeks with XRT          INTERVAL HISTORY Sean Garner. is a 77 y.o. 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  He reports feeling well.  Appetite is fair. Weight is stable. Denies any swallowing difficulty, nausea vomiting, epigastric pain. Denies fever, sore throat or respiratory distress.  Occasional  cough    Past Medical History:  Diagnosis Date   Anxiety    Cancer (HCC)    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   Hypercholesterolemia    Hypertension     Past Surgical History:  Procedure Laterality Date   CATARACT EXTRACTION W/PHACO Left 12/03/2017   Procedure: CATARACT EXTRACTION PHACO AND INTRAOCULAR LENS PLACEMENT (IOC) COMPLICATED DIABETIC LEFT;  Surgeon: Lockie Mola, MD;  Location: [facility];  Service: Ophthalmology;  Laterality: Left;  Diabetic - oral meds   CHOLECYSTECTOMY     COLONOSCOPY  2016   ESOPHAGOGASTRODUODENOSCOPY (EGD) WITH PROPOFOL N/A 04/08/2022   Procedure: ESOPHAGOGASTRODUODENOSCOPY (EGD) WITH PROPOFOL;  Surgeon: Lemar Lofty., MD;  Location: [facility];  Service: Gastroenterology;  Laterality: N/A;   ESOPHAGOGASTRODUODENOSCOPY (EGD) WITH PROPOFOL N/A 09/12/2022   Procedure: ESOPHAGOGASTRODUODENOSCOPY (EGD) WITH PROPOFOL;  Surgeon: Meridee Score Netty Starring., MD;  Location: [facility];  Service: Gastroenterology;  Laterality: N/A;   ESOPHAGOGASTRODUODENOSCOPY (EGD) WITH PROPOFOL N/A 04/01/2023   Procedure: ESOPHAGOGASTRODUODENOSCOPY (EGD) WITH PROPOFOL;  Surgeon: Meridee Score Netty Starring., MD;  Location: [facility];  Service: Gastroenterology;  Laterality: N/A;   GI RADIOFREQUENCY ABLATION  04/08/2022   Procedure: GI RADIOFREQUENCY ABLATION;  Surgeon: Meridee Score Netty Starring., MD;  Location: [facility];  Service: Gastroenterology;;   GI RADIOFREQUENCY ABLATION N/A 09/12/2022   Procedure: GI RADIOFREQUENCY ABLATION;  Surgeon: Lemar Lofty., MD;  Location: [facility];  Service: Gastroenterology;  Laterality: N/A;   GI RADIOFREQUENCY ABLATION  04/01/2023   Procedure:  GI RADIOFREQUENCY ABLATION;  Surgeon: Meridee Score Netty Starring., MD;  Location: [facility];  Service: Gastroenterology;;   PORTA CATH INSERTION N/A 11/13/2020   Procedure: PORTA CATH INSERTION;  Surgeon: Annice Needy, MD;  Location: [facility];  Service: Cardiovascular;  Laterality: N/A;    Family History  Problem Relation Age of Onset   Cancer Mother    Colon cancer Neg Hx    Colon polyps Neg Hx    Esophageal cancer Neg Hx    Rectal cancer Neg Hx    Stomach cancer Neg Hx    Inflammatory bowel disease Neg Hx    Liver disease Neg Hx    Pancreatic cancer Neg Hx     Social History:  reports that he quit smoking about 38 years ago. His smoking use included cigarettes. He has never used smokeless tobacco. He reports that he does not currently use alcohol. He reports that he does not use drugs. He quit smoking cold [place] in 1986. He smoked 2 packs per day for 20 years. 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 He was exposed to [employer]. He worked with a Architect for 34 years. The patient is accompanied by his wife today.  Allergies: No Known Allergies  Current Medications: Current Outpatient Medications  Medication Sig Dispense Refill   acetaminophen-codeine 120-12 MG/5ML solution Take 10 mLs by mouth every 6 (six) hours as needed for moderate pain. 120 mL 0   AMBULATORY NON FORMULARY MEDICATION Take 30 mLs by mouth in the morning, at noon, in the evening, and at bedtime. Medication Name: GI cocktail  90 ml viscous lidocaine 90 ml 10 mg/5 ml dicyclomine 270 ml maalox  Swish and swallow 450 mL 2   apixaban (ELIQUIS) 2.5 MG TABS tablet Take 1 tablet (2.5 mg total) by mouth 2 (two) times daily. 60 tablet 5   betamethasone valerate lotion (VALISONE) 0.1 % Apply 1 application  topically 2 (two) times daily as needed (dry scalp).     brimonidine-timolol (COMBIGAN) 0.2-0.5 % ophthalmic solution Place 1 drop into the left eye 2 (two) times daily.     Calcium  Carb-Cholecalciferol (CALCIUM 600 + D PO) Take 1 tablet by mouth daily.     cholecalciferol (VITAMIN D3) 25 MCG (1000 UNIT) tablet Take 1,000 Units by mouth daily.     citalopram (CELEXA) 10 MG tablet TAKE 1 TABLET BY MOUTH EVERY DAY 90 tablet 0   glucose blood test strip      loratadine (CLARITIN) 10 MG tablet Take 10 mg by mouth daily with lunch.     losartan (COZAAR) 25 MG tablet Take 25 mg by mouth 2 (two) times daily.  Magnesium 400 MG TABS Take 400 mg by mouth daily.     metFORMIN (GLUCOPHAGE-XR) 500 MG 24 hr tablet Take 500 mg by mouth 2 (two) times daily.     Multiple Vitamin (MULTIVITAMIN) tablet Take 1 tablet by mouth daily.     pantoprazole (PROTONIX) 40 MG tablet TAKE 1 TABLET (40 MG TOTAL) BY MOUTH TWICE A DAY BEFORE MEALS 180 tablet 2   Phenylephrine HCl (4-WAY FAST ACTING NA) Place 1 spray into the nose daily as needed (congestion).     simvastatin (ZOCOR) 40 MG tablet Take 40 mg by mouth daily at 6 PM.     sucralfate (CARAFATE) 1 g tablet Take 1 tablet (1 g total) by mouth 4 (four) times daily. 120 tablet 1   traZODone (DESYREL) 50 MG tablet TAKE 1 TABLET BY MOUTH AT BEDTIME AS NEEDED FOR SLEEP 90 tablet 1   vitamin B-12 (CYANOCOBALAMIN) 1000 MCG tablet Take 1,000 mcg by mouth daily.     Current Facility-Administered Medications  Medication Dose Route Frequency Provider Last Rate Last Admin   0.9 %  sodium chloride infusion  500 mL Intravenous Once Pyrtle, Carie Caddy, MD       Facility-Administered Medications Ordered in Other Visits  Medication Dose Route Frequency Provider Last Rate Last Admin   0.9 % NaCl with KCl 20 mEq/ L  infusion   Intravenous Once Corcoran, Melissa C, MD       heparin lock flush 100 UNIT/ML injection            heparin lock flush 100 unit/mL  500 Units Intravenous Once Rickard Patience, MD       magnesium sulfate IVPB 2 g 50 mL  2 g Intravenous Once Corcoran, Melissa C, MD       sodium chloride flush (NS) 0.9 % injection 10 mL  10 mL Intravenous Once Rickard Patience,  MD        Review of Systems  Constitutional: Negative.  Negative for chills, fever, malaise/fatigue and weight loss.  HENT:  Negative for congestion, ear pain and tinnitus.   Eyes: Negative.  Negative for blurred vision and double vision.  Respiratory: Negative.  Negative for cough, sputum production and shortness of breath.   Cardiovascular:  Negative for chest pain and palpitations.  Gastrointestinal: Negative.  Negative for abdominal pain, constipation, diarrhea, nausea and vomiting.  Genitourinary:  Negative for dysuria, frequency and urgency.  Musculoskeletal:  Negative for back pain and falls.  Skin: Negative.  Negative for rash.  Neurological: Negative.  Negative for weakness and headaches.  Endo/Heme/Allergies: Negative.  Does not bruise/bleed easily.  Psychiatric/Behavioral: Negative.  Negative for depression. The patient is not nervous/anxious and does not have insomnia.    Performance status (ECOG): 1  Vitals Blood pressure (!) 156/64, pulse (!) 55, temperature (!) 96 F (35.6 C), temperature source Tympanic, resp. rate 18, weight 199 lb 11.2 oz (90.6 kg), SpO2 94%.    Physical Exam Constitutional:      General: He is not in acute distress. HENT:     Head: Normocephalic.     Nose: Nose normal.     Mouth/Throat:     Pharynx: No oropharyngeal exudate.  Eyes:     General: No scleral icterus.    Pupils: Pupils are equal, round, and reactive to light.  Cardiovascular:     Rate and Rhythm: Normal rate.     Heart sounds: No murmur heard. Pulmonary:     Effort: Pulmonary effort is normal. No respiratory distress.  Breath sounds: No wheezing.  Abdominal:     General: Bowel sounds are normal. There is no distension.     Palpations: Abdomen is soft.  Musculoskeletal:        General: Normal range of motion.     Cervical back: Normal range of motion.  Skin:    General: Skin is warm and dry.     Findings: No erythema.  Neurological:     Mental Status: He is alert  and oriented to person, place, and time. Mental status is at baseline.     Cranial Nerves: No cranial nerve deficit.     Motor: No abnormal muscle tone.  Psychiatric:        Mood and Affect: Mood and affect normal.     Labs    Latest Ref Rng & Units 09/11/2023    9:23 AM 06/05/2023   10:00 AM 03/25/2023    9:59 AM  CBC  WBC 4.0 - 10.5 K/uL 4.3  6.5  4.9   Hemoglobin 13.0 - 17.0 g/dL 78.4  69.6  29.5   Hematocrit 39.0 - 52.0 % 38.5  40.5  41.1   Platelets 150 - 400 K/uL 184  171  155       Latest Ref Rng & Units 09/11/2023    9:23 AM 06/05/2023   10:00 AM 03/25/2023    9:59 AM  CMP  Glucose 70 - 99 mg/dL 284  132  440   BUN 8 - 23 mg/dL 12  21  15    Creatinine 0.61 - 1.24 mg/dL 1.02  7.25  3.66   Sodium 135 - 145 mmol/L 133  136  135   Potassium 3.5 - 5.1 mmol/L 3.7  3.6  3.8   Chloride 98 - 111 mmol/L 101  102  101   CO2 22 - 32 mmol/L 26  25  26    Calcium 8.9 - 10.3 mg/dL 8.4  8.7  8.8   Total Protein 6.5 - 8.1 g/dL 6.3  6.7  6.5   Total Bilirubin 0.3 - 1.2 mg/dL 0.7  0.8  0.9   Alkaline Phos 38 - 126 U/L 56  48  51   AST 15 - 41 U/L 19  19  21    ALT 0 - 44 U/L 21  21  19

## 2023-09-12 LAB — CEA: CEA: 1 ng/mL (ref 0.0–4.7)

## 2023-10-22 ENCOUNTER — Ambulatory Visit: Payer: Medicare Other | Admitting: Dermatology

## 2023-10-22 ENCOUNTER — Encounter: Payer: Self-pay | Admitting: Dermatology

## 2023-10-22 DIAGNOSIS — Z1283 Encounter for screening for malignant neoplasm of skin: Secondary | ICD-10-CM | POA: Diagnosis not present

## 2023-10-22 DIAGNOSIS — L814 Other melanin hyperpigmentation: Secondary | ICD-10-CM | POA: Diagnosis not present

## 2023-10-22 DIAGNOSIS — Z872 Personal history of diseases of the skin and subcutaneous tissue: Secondary | ICD-10-CM

## 2023-10-22 DIAGNOSIS — Z7189 Other specified counseling: Secondary | ICD-10-CM

## 2023-10-22 DIAGNOSIS — L219 Seborrheic dermatitis, unspecified: Secondary | ICD-10-CM

## 2023-10-22 DIAGNOSIS — L578 Other skin changes due to chronic exposure to nonionizing radiation: Secondary | ICD-10-CM | POA: Diagnosis not present

## 2023-10-22 DIAGNOSIS — Z79899 Other long term (current) drug therapy: Secondary | ICD-10-CM

## 2023-10-22 DIAGNOSIS — L820 Inflamed seborrheic keratosis: Secondary | ICD-10-CM

## 2023-10-22 DIAGNOSIS — W908XXA Exposure to other nonionizing radiation, initial encounter: Secondary | ICD-10-CM

## 2023-10-22 DIAGNOSIS — D229 Melanocytic nevi, unspecified: Secondary | ICD-10-CM

## 2023-10-22 DIAGNOSIS — D1801 Hemangioma of skin and subcutaneous tissue: Secondary | ICD-10-CM

## 2023-10-22 DIAGNOSIS — B351 Tinea unguium: Secondary | ICD-10-CM

## 2023-10-22 DIAGNOSIS — L821 Other seborrheic keratosis: Secondary | ICD-10-CM

## 2023-10-22 MED ORDER — BETAMETHASONE VALERATE 0.1 % EX LOTN: 1.0000 | TOPICAL_LOTION | CUTANEOUS | 6 refills | Status: AC

## 2023-10-22 MED ORDER — TERBINAFINE HCL 250 MG PO TABS: 250.0000 mg | ORAL_TABLET | Freq: Every day | ORAL | 0 refills | Status: AC

## 2023-10-22 NOTE — Patient Instructions (Signed)

## 2023-10-22 NOTE — Progress Notes (Signed)
Follow-Up Visit   Subjective  Sean Garner. is a 77 y.o. male who presents for the following: Skin Cancer Screening and Full Body Skin Exam, hx of Aks, seb derm scalp, needs more Betamethasone lotion  The patient presents for Total-Body Skin Exam (TBSE) for skin cancer screening and mole check. The patient has spots, moles and lesions to be evaluated, some may be new or changing and the patient may have concern these could be cancer.  The following portions of the chart were reviewed this encounter and updated as appropriate: medications, allergies, medical history  Review of Systems:  No other skin or systemic complaints except as noted in HPI or Assessment and Plan.  Objective  Well appearing patient in no apparent distress; mood and affect are within normal limits.  A full examination was performed including scalp, head, eyes, ears, nose, lips, neck, chest, axillae, abdomen, back, buttocks, bilateral upper extremities, bilateral lower extremities, hands, feet, fingers, toes, fingernails, and toenails. All findings within normal limits unless otherwise noted below.   Relevant physical exam findings are noted in the Assessment and Plan.  R post ear x 1 Stuck on waxy paps with erythema   Assessment & Plan   SKIN CANCER SCREENING PERFORMED TODAY.  ACTINIC DAMAGE - Chronic condition, secondary to cumulative UV/sun exposure - diffuse scaly erythematous macules with underlying dyspigmentation - Recommend daily broad spectrum sunscreen SPF 30+ to sun-exposed areas, reapply every 2 hours as needed.  - Staying in the shade or wearing long sleeves, sun glasses (UVA+UVB protection) and wide brim hats (4-inch brim around the entire circumference of the hat) are also recommended for sun protection.  - Call for new or changing lesions.  LENTIGINES, SEBORRHEIC KERATOSES, HEMANGIOMAS - Benign normal skin lesions - Benign-appearing - Call for any changes  MELANOCYTIC NEVI - Tan-brown  and/or pink-flesh-colored symmetric macules and papules - Benign appearing on exam today - Observation - Call clinic for new or changing moles - Recommend daily use of broad spectrum spf 30+ sunscreen to sun-exposed areas.   SEBORRHEIC DERMATITIS scalp Exam: Pink patches with greasy scale at scalp Chronic and persistent condition with duration or expected duration over one year. Condition is symptomatic/ bothersome to patient. Not currently at goal.   Seborrheic Dermatitis is a chronic persistent rash characterized by pinkness and scaling most commonly of the mid face but also can occur on the scalp (dandruff), ears; mid chest, mid back and groin.  It tends to be exacerbated by stress and cooler weather.  People who have neurologic disease may experience new onset or exacerbation of existing seborrheic dermatitis.  The condition is not curable but treatable and can be controlled.  Treatment Plan: Cont Betamethasone lotion qd up to 5d/wk aa scalp, avoid f/g/a    Inflamed seborrheic keratosis R post ear x 1  Symptomatic, irritating, patient would like treated.  Destruction of lesion - R post ear x 1 Complexity: simple   Destruction method: cryotherapy   Informed consent: discussed and consent obtained   Timeout:  patient name, date of birth, surgical site, and procedure verified Lesion destroyed using liquid nitrogen: Yes   Region frozen until ice ball extended beyond lesion: Yes   Outcome: patient tolerated procedure well with no complications   Post-procedure details: wound care instructions given     ONYCHOMYCOSIS Exam: Thickened toenails with subungal debris c/w onychomycosis, clearing at the base Chronic and persistent condition with duration or expected duration over one year. Condition is improving with treatment but  not currently at goal. Treatment Plan: Recommend 1 more month of Terbinafine Restart Terbinafine 250mg  1 po qd    HISTORY OF PRECANCEROUS ACTINIC  KERATOSIS - site(s) of PreCancerous Actinic Keratosis clear today. - these may recur and new lesions may form requiring treatment to prevent transformation into skin cancer - observe for new or changing spots and contact [facility] for appointment if occur - photoprotection with sun protective clothing; sunglasses and broad spectrum sunscreen with SPF of at least 30 + and frequent self skin exams recommended - yearly exams by a dermatologist recommended for persons with history of PreCancerous Actinic Keratoses   Return in about 1 year (around 10/21/2024) for TBSE.  I, Ardis Rowan, RMA, am acting as scribe for Armida Sans, MD .   Documentation: I have reviewed the above documentation for accuracy and completeness, and I agree with the above.  Armida Sans, MD

## 2023-10-23 ENCOUNTER — Telehealth: Payer: Self-pay | Admitting: Gastroenterology

## 2023-10-23 ENCOUNTER — Inpatient Hospital Stay: Payer: Medicare Other | Attending: Oncology

## 2023-10-23 DIAGNOSIS — Z87891 Personal history of nicotine dependence: Secondary | ICD-10-CM | POA: Insufficient documentation

## 2023-10-23 DIAGNOSIS — Z452 Encounter for adjustment and management of vascular access device: Secondary | ICD-10-CM | POA: Diagnosis present

## 2023-10-23 DIAGNOSIS — C01 Malignant neoplasm of base of tongue: Secondary | ICD-10-CM | POA: Insufficient documentation

## 2023-10-23 DIAGNOSIS — Z95828 Presence of other vascular implants and grafts: Secondary | ICD-10-CM

## 2023-10-23 MED ORDER — SODIUM CHLORIDE 0.9% FLUSH
10.0000 mL | Freq: Once | INTRAVENOUS | Status: AC
Start: 2023-10-23 — End: 2023-10-23
  Administered 2023-10-23: 10 mL via INTRAVENOUS
  Filled 2023-10-23: qty 10

## 2023-10-23 MED ORDER — HEPARIN SOD (PORK) LOCK FLUSH 100 UNIT/ML IV SOLN
500.0000 [IU] | Freq: Once | INTRAVENOUS | Status: AC
Start: 2023-10-23 — End: 2023-10-23
  Administered 2023-10-23: 500 [IU] via INTRAVENOUS
  Filled 2023-10-23: qty 5

## 2023-10-23 NOTE — Telephone Encounter (Signed)
Inbound call from patient requesting a call from the nurse to discuss if the schedule has been  released for procedure at hospital. Please advise.

## 2023-10-24 NOTE — Telephone Encounter (Signed)
Appt has been moved to 12/11/23 at 1115 am [facility] with GM   I will resend a note to Dr Cathie Hoops to confirm that this is still ok to hold.    Rickard Patience, MD to Beverley Fiedler, MD  Mansouraty, Netty Starring., MD   ZY   11:37 PM Thanks you for the update.  He is on low dose Eliquis for prophylaxis. Ok to hold 2 days prior to RFA.   Zhou     EGD scheduled, pt instructed and medications reviewed.  Patient instructions mailed to home.  Patient to call with any questions or concerns.

## 2023-10-27 ENCOUNTER — Telehealth: Payer: Self-pay

## 2023-10-27 ENCOUNTER — Other Ambulatory Visit: Payer: Self-pay

## 2023-10-27 NOTE — Telephone Encounter (Signed)
Fax received from [facility] stating they could not order Betamethasone Val. 0.1% lotion, and we can send that prescription to another pharmacy or substitute it with a different medication. After speaking with patient he prefers to order it through the [place] and he will call if he has any issues or concerns.

## 2023-10-28 ENCOUNTER — Encounter: Payer: Self-pay | Admitting: Dermatology

## 2023-10-30 ENCOUNTER — Telehealth: Payer: Self-pay

## 2023-10-30 NOTE — Telephone Encounter (Signed)
-----   Message from Rickard Patience sent at 10/30/2023  9:53 AM EST ----- It is ok for him to stop Eliquis 2.5mg  BID for 3 days prior to the procedure. Resume medication 1 day after procedure.  Thank you    Zhou ----- Message ----- From: Loretha Stapler, RN Sent: 10/24/2023   2:49 PM EST To: Rickard Patience, MD

## 2023-10-30 NOTE — Telephone Encounter (Signed)
The pt has been advised 

## 2023-10-31 ENCOUNTER — Telehealth: Payer: Self-pay | Admitting: Gastroenterology

## 2023-10-31 NOTE — Telephone Encounter (Signed)
Inbound call from patient would like to confirm that procedure scheduled at [facility] on 1/23 is covered with his VA authorization. Patient would like to talk to [facility] to discuss.

## 2023-11-01 ENCOUNTER — Other Ambulatory Visit: Payer: Self-pay | Admitting: Psychiatry

## 2023-11-01 ENCOUNTER — Other Ambulatory Visit: Payer: Self-pay | Admitting: Dermatology

## 2023-11-01 DIAGNOSIS — B351 Tinea unguium: Secondary | ICD-10-CM

## 2023-11-01 DIAGNOSIS — F411 Generalized anxiety disorder: Secondary | ICD-10-CM

## 2023-11-01 DIAGNOSIS — F5101 Primary insomnia: Secondary | ICD-10-CM

## 2023-11-03 NOTE — Telephone Encounter (Signed)
Can one of you help with this?

## 2023-11-03 NOTE — Telephone Encounter (Signed)
Form completed and faxed.   Pt aware.

## 2023-11-10 ENCOUNTER — Ambulatory Visit (HOSPITAL_COMMUNITY): Admit: 2023-11-10 | Payer: Medicare Other | Admitting: Gastroenterology

## 2023-11-10 ENCOUNTER — Encounter (HOSPITAL_COMMUNITY): Payer: Self-pay

## 2023-11-10 SURGERY — ESOPHAGOGASTRODUODENOSCOPY (EGD) WITH PROPOFOL
Anesthesia: Monitor Anesthesia Care

## 2023-11-24 NOTE — Telephone Encounter (Signed)
 Appt has been moved to 01/05/24 at 1 pm at [facility] with GM due to Dr Elesa Hacker scheduler change

## 2023-11-25 NOTE — Telephone Encounter (Signed)
 EGD rescheduled, pt instructed and medications reviewed.  Patient instructions mailed to home.  Patient to call with any questions or concerns. He will hold Eliquis 2 days prior

## 2023-12-02 ENCOUNTER — Other Ambulatory Visit: Payer: Self-pay | Admitting: Psychiatry

## 2023-12-02 DIAGNOSIS — F411 Generalized anxiety disorder: Secondary | ICD-10-CM

## 2023-12-02 DIAGNOSIS — F5101 Primary insomnia: Secondary | ICD-10-CM

## 2023-12-04 ENCOUNTER — Other Ambulatory Visit: Payer: Self-pay | Admitting: Dermatology

## 2023-12-04 ENCOUNTER — Inpatient Hospital Stay: Payer: Medicare Other | Attending: Oncology

## 2023-12-04 ENCOUNTER — Other Ambulatory Visit: Payer: Self-pay

## 2023-12-04 DIAGNOSIS — C01 Malignant neoplasm of base of tongue: Secondary | ICD-10-CM

## 2023-12-04 MED ORDER — LIDOCAINE-PRILOCAINE 2.5-2.5 % EX CREA: TOPICAL_CREAM | CUTANEOUS | 3 refills | Status: AC

## 2023-12-05 ENCOUNTER — Telehealth: Payer: Self-pay

## 2023-12-05 ENCOUNTER — Telehealth: Payer: Self-pay | Admitting: Psychiatry

## 2023-12-05 DIAGNOSIS — F5101 Primary insomnia: Secondary | ICD-10-CM

## 2023-12-05 MED ORDER — TRAZODONE HCL 50 MG PO TABS: 50.0000 mg | ORAL_TABLET | Freq: Every evening | ORAL | 0 refills | Status: DC | PRN

## 2023-12-05 NOTE — Telephone Encounter (Signed)
Received fax from pharmacy requesting a refill for the following medication please advise  traZODone (DESYREL) 50 MG tablet   Last visit 01-30-22 Next visit 01-01-24    Preferred Pharmacies   [id number] [place], [place] - [address] Phone: [phone]  Fax: [phone]

## 2023-12-05 NOTE — Telephone Encounter (Signed)
Done

## 2023-12-05 NOTE — Telephone Encounter (Signed)
I have sent trazodone to pharmacy to last until next appointment.

## 2023-12-16 ENCOUNTER — Telehealth: Payer: Self-pay | Admitting: Gastroenterology

## 2023-12-16 ENCOUNTER — Other Ambulatory Visit: Payer: Self-pay

## 2023-12-16 DIAGNOSIS — K22711 Barrett's esophagus with high grade dysplasia: Secondary | ICD-10-CM

## 2023-12-16 DIAGNOSIS — Z8501 Personal history of malignant neoplasm of esophagus: Secondary | ICD-10-CM

## 2023-12-16 NOTE — Telephone Encounter (Signed)
Patient is requesting a call back to confirm procedure 02/17 done at the hospital approved by the [place].

## 2023-12-17 NOTE — Telephone Encounter (Signed)
Can one of you help with this? I did send the form to the [place] but have not heard anything back.

## 2023-12-23 ENCOUNTER — Encounter: Payer: Self-pay | Admitting: Psychiatry

## 2023-12-23 ENCOUNTER — Ambulatory Visit: Payer: Medicare Other | Admitting: Psychiatry

## 2023-12-23 VITALS — BP 138/71 | HR 60 | Temp 98.0°F | Ht 70.0 in | Wt 204.2 lb

## 2023-12-23 DIAGNOSIS — F4312 Post-traumatic stress disorder, chronic: Secondary | ICD-10-CM | POA: Diagnosis not present

## 2023-12-23 DIAGNOSIS — F5101 Primary insomnia: Secondary | ICD-10-CM

## 2023-12-23 DIAGNOSIS — F411 Generalized anxiety disorder: Secondary | ICD-10-CM | POA: Diagnosis not present

## 2023-12-23 MED ORDER — TRAZODONE HCL 50 MG PO TABS: 50.0000 mg | ORAL_TABLET | Freq: Every evening | ORAL | 1 refills | Status: DC | PRN

## 2023-12-23 NOTE — Progress Notes (Signed)
 [facility] MD OP Progress Note  12/23/2023 1:50 PM Sean Garner.  MRN:  [id number]  Chief Complaint:  Chief Complaint  Patient presents with   Follow-up   Anxiety   Insomnia   Medication Refill   HPI: Sean Garner. 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  Patient reports depression and anxiety has improved, feeling generally well but occasionally 'keyed up,' especially when assisting his cousin with multiple sclerosis. He takes citalopram  10 mg daily for depression.  Denies side effects.  He has gained weight, increasing from 195 pounds last week to 204 pounds today. He had previously lost about 40 pounds following treatments but regained weight as his appetite improved. He struggles with portion control despite efforts to reduce bread and sugar intake. He walks 1.5 to 2 miles daily.  He manages diabetes with metformin, taking 1.5 tablets in the morning and at night. His recent glucose level was 260, and his hemoglobin A1c was 7.2 in November.  He is currently following up with primary care provider for management of the same.  He has a history of hypertension, with a current reading of 147/76, compared to 135/76 last week. He takes losartan in the morning and at night and attributes today's higher reading to rushing to the appointment.  He experiences sleep disturbances, waking around 2:30 AM and staying awake for about an hour, often due to nocturia. He takes trazodone  for sleep.  Denies side effects to trazodone .  He continues to be under the care of oncology, reports he has upcoming imaging , but overall he is doing fairly well.  Denies any other concerns today. Visit Diagnosis:    ICD-10-CM   1. GAD (generalized anxiety disorder)  F41.1      2. Chronic post-traumatic stress disorder (PTSD)  F43.12     3. Primary insomnia  F51.01 traZODone  (DESYREL ) 50 MG tablet      Past Psychiatric History: I have reviewed past psychiatric history from progress note on 01/01/2021.  Past trials of medications like Celexa , trazodone , mirtazapine -nightmares  Past Medical History:  Past Medical History:  Diagnosis Date   Actinic keratosis    Anxiety    Cancer (HCC)    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   Hypercholesterolemia    Hypertension     Past Surgical History:  Procedure Laterality Date   CATARACT EXTRACTION W/PHACO Left 12/03/2017   Procedure: CATARACT EXTRACTION PHACO AND INTRAOCULAR LENS PLACEMENT (IOC) COMPLICATED DIABETIC LEFT;  Surgeon: Mittie Gaskin, MD;  Location: [facility];  Service: Ophthalmology;  Laterality: Left;  Diabetic - oral meds   CHOLECYSTECTOMY     COLONOSCOPY  2016   ESOPHAGOGASTRODUODENOSCOPY (EGD) WITH PROPOFOL  N/A 04/08/2022   Procedure: ESOPHAGOGASTRODUODENOSCOPY (EGD) WITH PROPOFOL ;  Surgeon: Wilhelmenia Aloha Garner., MD;  [facility]: [facility];  Service: Gastroenterology;  Laterality: N/A;   ESOPHAGOGASTRODUODENOSCOPY (EGD) WITH PROPOFOL  N/A 09/12/2022   Procedure: ESOPHAGOGASTRODUODENOSCOPY (EGD) WITH PROPOFOL ;  Surgeon: Wilhelmenia Aloha Garner., MD;  Location: [facility];  Service: Gastroenterology;  Laterality: N/A;   ESOPHAGOGASTRODUODENOSCOPY (EGD) WITH PROPOFOL  N/A 04/01/2023   Procedure: ESOPHAGOGASTRODUODENOSCOPY (EGD) WITH PROPOFOL ;  Surgeon: Wilhelmenia Aloha Raddle., MD;  Location: [facility];  Service: Gastroenterology;  Laterality: N/A;   GI RADIOFREQUENCY ABLATION  04/08/2022   Procedure: GI RADIOFREQUENCY  ABLATION;  Surgeon: Wilhelmenia Aloha Raddle., MD;  Location: [facility];  Service: Gastroenterology;;   GI RADIOFREQUENCY ABLATION N/A 09/12/2022   Procedure: GI RADIOFREQUENCY ABLATION;  Surgeon: Wilhelmenia Aloha Raddle., MD;  Location: [facility];  Service: Gastroenterology;  Laterality: N/A;   GI RADIOFREQUENCY ABLATION  04/01/2023   Procedure: GI RADIOFREQUENCY ABLATION;  Surgeon: Wilhelmenia Aloha Raddle., MD;  Location: [facility];  Service: Gastroenterology;;   PORTA CATH INSERTION N/A 11/13/2020   Procedure: PORTA CATH INSERTION;  Surgeon: Marea Selinda RAMAN, MD;  Location: [facility];  Service: Cardiovascular;  Laterality: N/A;    Family Psychiatric History: I have reviewed family psychiatric history from progress note on 01/01/2021.  Family History:  Family History  Problem Relation Age of Onset   Cancer Mother    Colon cancer Neg Hx    Colon polyps Neg Hx    Esophageal cancer Neg Hx    Rectal cancer Neg Hx    Stomach cancer Neg Hx    Inflammatory bowel disease Neg Hx    Liver disease Neg Hx    Pancreatic cancer Neg Hx     Social History: I have reviewed social history from progress note on 01/01/2021. Social History   Socioeconomic History   Marital status: Married    Spouse name: Not on file   Number of children: Not on file   Years of education: Not on file   Highest education level: Not on file  Occupational History   Not on file  Tobacco Use   Smoking status: Former    Current packs/day: 0.00    Types: Cigarettes    Quit date: 54    Years since quitting: 39.1   Smokeless tobacco: Never  Vaping Use   Vaping status: Never Used  Substance and Sexual Activity   Alcohol use: Not Currently    Comment: occasional - 1 glass wine/month   Drug use: Never   Sexual activity: Not on file  Other Topics Concern   Not on file  Social History Narrative   Not on file   Social Drivers of Health   Financial Resource Strain: Patient Declined (11/07/2023)    Received from [facility]   Overall Financial Resource Strain (CARDIA)    Difficulty of Paying Living Expenses: Patient declined  Food Insecurity: Patient Declined (11/07/2023)   Received from [facility]   Hunger Vital Sign    Worried About Running Out of Food in the Last Year: Patient declined    Ran Out of Food in the Last Year: Patient declined  Transportation Needs: Patient Declined (11/07/2023)   Received from [facility]   PRAPARE - Transportation    In the past 12 months, has lack of transportation kept you from medical appointments or from getting medications?: Patient declined    Lack of Transportation (Non-Medical): Patient declined  Physical Activity: Not on file  Stress: Not on file  Social Connections: Not on file    Allergies: No Known Allergies  Metabolic Disorder Labs: Lab Results  Component Value Date   HGBA1C 8.1 (H) 01/20/2021   MPG 185.77 01/20/2021   No results found for: PROLACTIN No results found for: CHOL, TRIG, HDL, CHOLHDL, VLDL, LDLCALC Lab  Results  Component Value Date   TSH 0.577 01/08/2021   TSH 2.077 11/27/2020    Therapeutic Level Labs: No results found for: LITHIUM No results found for: VALPROATE No results found for: CBMZ  Current Medications: Current Outpatient Medications  Medication Sig Dispense Refill   acetaminophen -codeine  120-12 MG/5ML solution Take 10 mLs by mouth every 6 (six) hours as needed for moderate pain. 120 mL 0   AMBULATORY NON FORMULARY MEDICATION Take 30 mLs by mouth in the morning, at noon, in the evening, and at bedtime. Medication Name: GI cocktail  90 ml viscous lidocaine  90 ml 10 mg/5 ml dicyclomine  270 ml maalox  Swish and swallow 450 mL 2   apixaban  (ELIQUIS ) 2.5 MG TABS tablet Take 1 tablet (2.5 mg total) by mouth 2 (two) times daily. 60 tablet 5   betamethasone  valerate lotion (VALISONE ) 0.1 % Apply 1 Application topically as  directed. qd up to 5 days a week to aa scalp, avoid face, groin, axilla 60 mL 6   brimonidine -timolol  (COMBIGAN ) 0.2-0.5 % ophthalmic solution Place 1 drop into the left eye 2 (two) times daily.     Calcium Carb-Cholecalciferol (CALCIUM 600 + D PO) Take 1 tablet by mouth daily.     cholecalciferol (VITAMIN D3) 25 MCG (1000 UNIT) tablet Take 1,000 Units by mouth daily.     citalopram  (CELEXA ) 10 MG tablet TAKE 1 TABLET BY MOUTH EVERY DAY 90 tablet 1   glucose blood test strip      lidocaine -prilocaine  (EMLA ) cream Apply small amount to port and cover with saran wrap 1-2 hours prior to port access 30 g 3   loratadine (CLARITIN) 10 MG tablet Take 10 mg by mouth daily with lunch.     losartan (COZAAR) 25 MG tablet Take 25 mg by mouth 2 (two) times daily.     Magnesium  400 MG TABS Take 400 mg by mouth daily.     metFORMIN (GLUCOPHAGE-XR) 500 MG 24 hr tablet Take 500 mg by mouth 2 (two) times daily.     Multiple Vitamin (MULTIVITAMIN) tablet Take 1 tablet by mouth daily.     pantoprazole  (PROTONIX ) 40 MG tablet TAKE 1 TABLET (40 MG TOTAL) BY MOUTH TWICE A DAY BEFORE MEALS 180 tablet 2   Phenylephrine HCl (4-WAY FAST ACTING NA) Place 1 spray into the nose daily as needed (congestion).     simvastatin  (ZOCOR ) 40 MG tablet Take 40 mg by mouth daily at 6 PM.     sucralfate  (CARAFATE ) 1 g tablet Take 1 tablet (1 g total) by mouth 4 (four) times daily. 120 tablet 1   terbinafine  (LAMISIL ) 250 MG tablet Take 1 tablet (250 mg total) by mouth daily. 30 tablet 0   vitamin B-12 (CYANOCOBALAMIN) 1000 MCG tablet Take 1,000 mcg by mouth daily.     traZODone  (DESYREL ) 50 MG tablet Take 1 tablet (50 mg total) by mouth at bedtime as needed. for sleep 90 tablet 1   Current Facility-Administered Medications  Medication Dose Route Frequency Provider Last Rate Last Admin   0.9 %  sodium chloride  infusion  500 mL Intravenous Once Pyrtle, Gordy HERO, MD       Facility-Administered Medications Ordered in Other Visits   Medication Dose Route Frequency Provider Last Rate Last Admin   0.9 % NaCl with KCl 20 mEq/ L  infusion   Intravenous Once Corcoran, Melissa C, MD       heparin  lock flush 100 UNIT/ML injection  heparin  lock flush 100 unit/mL  500 Units Intravenous Once Babara Call, MD       magnesium  sulfate IVPB 2 g 50 mL  2 g Intravenous Once Corcoran, Melissa C, MD       sodium chloride  flush (NS) 0.9 % injection 10 mL  10 mL Intravenous Once Babara Call, MD         Musculoskeletal: Strength & Muscle Tone: within normal limits Gait & Station: normal Patient leans: N/A  Psychiatric Specialty Exam: Review of Systems  Psychiatric/Behavioral:  Positive for sleep disturbance.     Blood pressure 138/71, pulse 60, temperature 98 F (36.7 C), temperature source Skin, height 5' 10 (1.778 m), weight 204 lb 3.2 oz (92.6 kg).Body mass index is 29.3 kg/m.  General Appearance: Fairly Groomed  Eye Contact:  Fair  Speech:  Clear and Coherent  Volume:  Normal  Mood:  Euthymic  Affect:  Appropriate  Thought Process:  Goal Directed and Descriptions of Associations: Intact  Orientation:  Full (Time, Place, and Person)  Thought Content: Logical   Suicidal Thoughts:  No  Homicidal Thoughts:  No  Memory:  Immediate;   Fair Recent;   Fair Remote;   Fair  Judgement:  Fair  Insight:  Fair  Psychomotor Activity:  Normal  Concentration:  Concentration: Fair and Attention Span: Fair  Recall:  [employer] of Knowledge: Fair  Language: Fair  Akathisia:  No  Handed:  Right  AIMS (if indicated): not done  Assets:  Communication Skills Desire for Improvement Housing Social Support Talents/Skills  ADL's:  Intact  Cognition: WNL  Sleep:   Restless at times due to nocturia   Screenings: AIMS    Flowsheet Row Office Visit from 07/02/2022 in [facility]  AIMS Total Score 0      GAD-7    Flowsheet Row Office Visit from 12/23/2023 in [facility] Office Visit from 07/02/2022 in [facility] Office Visit from 01/30/2022 in [facility] Video Visit from 01/01/2021 in [facility]  Total GAD-7 Score 0 7 2 12       PHQ2-9    Flowsheet Row Office Visit from 12/23/2023 in [facility] Office Visit from 07/02/2022 in [facility] Office Visit from 01/30/2022 in [facility] Office Visit from 10/22/2021 in [facility] Office Visit from 08/06/2021 in [facility]  PHQ-2 Total Score 0 1 0 0 0  PHQ-9 Total Score -- -- -- -- 3      Flowsheet Row Office Visit from 12/23/2023 in [facility] Admission (Discharged) from 04/01/2023 in [facility] Admission (Discharged) from 09/12/2022 in [facility]  C-SSRS RISK CATEGORY No Risk No Risk No Risk        Assessment and Plan: Sean Garner. Is a 78 year old Caucasian male, married, has a history of PTSD, stage I right base of tongue carcinoma, stage Ib adenocarcinoma of GE junction, hypertension, hyperlipidemia was evaluated in office today.  Patient with sleep problems likely due to nocturia otherwise doing fairly well with regards to mood symptoms, discussed assessment and plan as noted below.  Generalized anxiety disorder-stable PTSD-stable Primary insomnia-stable Minimal anxiety and depression symptoms. On citalopram  10 mg daily. Occasional sleep disturbances, overall fair sleep quality. Situational anxiety  related to caregiving responsibilities for a cousin with MS. - Continue Citalopram  10 mg daily - Continue Trazodone  50 mg at bedtime for sleep as needed - Advise on  sleep hygiene practices, including reducing fluid intake in the evening and urinating before bed   Patient with elevated blood pressure reading blood pressure was repeated and trending low.  Patient to keep track and follow up with primary care provider as needed.   Follow-up - Schedule follow-up appointment in six months  Consent: Patient/Guardian gives verbal consent for treatment and assignment of benefits for services provided during this visit. Patient/Guardian expressed understanding and agreed to proceed.   This note was generated in part or whole with voice recognition software. Voice recognition is usually quite accurate but there are transcription errors that can and very often do occur. I apologize for any typographical errors that were not detected and corrected.    Arnold Depinto, MD 12/25/2023, 2:37 PM

## 2023-12-26 ENCOUNTER — Telehealth: Payer: Self-pay | Admitting: Gastroenterology

## 2023-12-26 NOTE — Telephone Encounter (Signed)
 [facility] called and stated that they will be faxing over the extension for this patient procedure. [facility] provided us  with the verification number [id number]. Please advise.

## 2023-12-30 ENCOUNTER — Encounter (HOSPITAL_COMMUNITY): Payer: Self-pay | Admitting: Gastroenterology

## 2023-12-30 NOTE — Progress Notes (Signed)
Attempted to obtain medical history for pre op call via telephone, unable to reach at this time. HIPAA compliant voicemail message left requesting return call to pre surgical testing department.

## 2023-12-31 NOTE — Telephone Encounter (Signed)
I did call the pt and let him know that we are checking to confirm.

## 2023-12-31 NOTE — Telephone Encounter (Signed)
Patient requesting a call to discuss if we have received VA auth for procedure on 2/17. Please advise.

## 2023-12-31 NOTE — Telephone Encounter (Signed)
Can one of you please help me with this? Thank you

## 2023-12-31 NOTE — Telephone Encounter (Signed)
April can you check to make sure we have the auth for this pt appt on Monday.  I think we do but I am not sure where to look to confirm.

## 2024-01-01 ENCOUNTER — Ambulatory Visit: Payer: Self-pay | Admitting: Psychiatry

## 2024-01-01 ENCOUNTER — Telehealth: Payer: Self-pay | Admitting: *Deleted

## 2024-01-01 NOTE — Telephone Encounter (Signed)
Thank you for checking behind me.  Pt aware

## 2024-01-01 NOTE — Telephone Encounter (Signed)
Auth department will obtain auth from insurance approx 1 week prior to scan. They will obtain auth from [place] as well. If either insurance does not approve scan, pt will be notified.

## 2024-01-01 NOTE — Telephone Encounter (Signed)
The pt does have 2 scans on 03/11/2024. I am not sure if that what he was talking about. He wants to give staff time to get the approval with auth through [place]. He says that he was [place] in [place]. And he will need somone to get the auth from [place] and let him know

## 2024-01-05 ENCOUNTER — Other Ambulatory Visit: Payer: Self-pay

## 2024-01-05 ENCOUNTER — Ambulatory Visit (HOSPITAL_COMMUNITY): Payer: Medicare Other | Admitting: Anesthesiology

## 2024-01-05 ENCOUNTER — Encounter (HOSPITAL_COMMUNITY)
Admission: RE | Disposition: A | Discharge status: Home / Self Care | Payer: Self-pay | Source: Ambulatory Visit | Attending: Gastroenterology

## 2024-01-05 ENCOUNTER — Encounter (HOSPITAL_COMMUNITY): Payer: Self-pay | Admitting: Gastroenterology

## 2024-01-05 ENCOUNTER — Ambulatory Visit (HOSPITAL_COMMUNITY)
Admission: RE | Admit: 2024-01-05 | Discharge: 2024-01-05 | Disposition: A | Discharge status: Home / Self Care | Payer: Medicare Other | Source: Ambulatory Visit | Attending: Gastroenterology | Admitting: Gastroenterology

## 2024-01-05 DIAGNOSIS — Z86718 Personal history of other venous thrombosis and embolism: Secondary | ICD-10-CM | POA: Diagnosis not present

## 2024-01-05 DIAGNOSIS — Z9221 Personal history of antineoplastic chemotherapy: Secondary | ICD-10-CM | POA: Diagnosis not present

## 2024-01-05 DIAGNOSIS — Z09 Encounter for follow-up examination after completed treatment for conditions other than malignant neoplasm: Secondary | ICD-10-CM | POA: Diagnosis present

## 2024-01-05 DIAGNOSIS — C159 Malignant neoplasm of esophagus, unspecified: Secondary | ICD-10-CM | POA: Diagnosis not present

## 2024-01-05 DIAGNOSIS — Z8719 Personal history of other diseases of the digestive system: Secondary | ICD-10-CM | POA: Insufficient documentation

## 2024-01-05 DIAGNOSIS — K449 Diaphragmatic hernia without obstruction or gangrene: Secondary | ICD-10-CM

## 2024-01-05 DIAGNOSIS — K2950 Unspecified chronic gastritis without bleeding: Secondary | ICD-10-CM | POA: Diagnosis not present

## 2024-01-05 DIAGNOSIS — Z77098 Contact with and (suspected) exposure to other hazardous, chiefly nonmedicinal, chemicals: Secondary | ICD-10-CM | POA: Diagnosis not present

## 2024-01-05 DIAGNOSIS — K2270 Barrett's esophagus without dysplasia: Secondary | ICD-10-CM

## 2024-01-05 DIAGNOSIS — K2289 Other specified disease of esophagus: Secondary | ICD-10-CM | POA: Insufficient documentation

## 2024-01-05 DIAGNOSIS — Z86711 Personal history of pulmonary embolism: Secondary | ICD-10-CM | POA: Diagnosis not present

## 2024-01-05 DIAGNOSIS — K219 Gastro-esophageal reflux disease without esophagitis: Secondary | ICD-10-CM | POA: Diagnosis not present

## 2024-01-05 DIAGNOSIS — Z87891 Personal history of nicotine dependence: Secondary | ICD-10-CM | POA: Diagnosis not present

## 2024-01-05 DIAGNOSIS — K3189 Other diseases of stomach and duodenum: Secondary | ICD-10-CM | POA: Diagnosis not present

## 2024-01-05 DIAGNOSIS — Z8501 Personal history of malignant neoplasm of esophagus: Secondary | ICD-10-CM | POA: Insufficient documentation

## 2024-01-05 DIAGNOSIS — I10 Essential (primary) hypertension: Secondary | ICD-10-CM | POA: Insufficient documentation

## 2024-01-05 DIAGNOSIS — E119 Type 2 diabetes mellitus without complications: Secondary | ICD-10-CM | POA: Insufficient documentation

## 2024-01-05 DIAGNOSIS — M21372 Foot drop, left foot: Secondary | ICD-10-CM | POA: Diagnosis not present

## 2024-01-05 DIAGNOSIS — Z7984 Long term (current) use of oral hypoglycemic drugs: Secondary | ICD-10-CM | POA: Insufficient documentation

## 2024-01-05 DIAGNOSIS — K31A14 Gastric intestinal metaplasia without dysplasia, involving the cardia: Secondary | ICD-10-CM

## 2024-01-05 HISTORY — PX: BIOPSY: SHX5522

## 2024-01-05 HISTORY — PX: ESOPHAGOGASTRODUODENOSCOPY (EGD) WITH PROPOFOL: SHX5813

## 2024-01-05 LAB — GLUCOSE, CAPILLARY: Glucose-Capillary: 225 mg/dL — ABNORMAL HIGH (ref 70–99)

## 2024-01-05 SURGERY — ESOPHAGOGASTRODUODENOSCOPY (EGD) WITH PROPOFOL
Anesthesia: Monitor Anesthesia Care

## 2024-01-05 MED ORDER — SODIUM CHLORIDE 0.9 % IV SOLN: INTRAVENOUS | Status: DC

## 2024-01-05 MED ORDER — PROPOFOL 10 MG/ML IV BOLUS
INTRAVENOUS | Status: DC | PRN
  Administered 2024-01-05: 10 mg via INTRAVENOUS
  Administered 2024-01-05: 20 mg via INTRAVENOUS
  Administered 2024-01-05: 130 ug/kg/min via INTRAVENOUS
  Administered 2024-01-05: 20 mg via INTRAVENOUS

## 2024-01-05 MED ORDER — PROPOFOL 1000 MG/100ML IV EMUL
INTRAVENOUS | Status: AC
  Filled 2024-01-05: qty 100

## 2024-01-05 MED ORDER — PROPOFOL 500 MG/50ML IV EMUL
INTRAVENOUS | Status: AC
  Filled 2024-01-05: qty 50

## 2024-01-05 MED ORDER — ACETYLCYSTEINE 20 % IN SOLN
RESPIRATORY_TRACT | Status: AC
  Filled 2024-01-05: qty 4

## 2024-01-05 MED ORDER — LIDOCAINE 2% (20 MG/ML) 5 ML SYRINGE
INTRAMUSCULAR | Status: DC | PRN
Start: 2024-01-05 — End: 2024-01-05
  Administered 2024-01-05: 100 mg via INTRAVENOUS

## 2024-01-05 SURGICAL SUPPLY — 14 items

## 2024-01-05 NOTE — H&P (Signed)
GASTROENTEROLOGY PROCEDURE H&P NOTE   Primary Care Physician: [name], MD  HPI: Sean Garner. is a 78 y.o. 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   Hypercholesterolemia    Hypertension    Past Surgical History:  Procedure Laterality Date   CATARACT EXTRACTION W/PHACO Left 12/03/2017   Procedure: CATARACT EXTRACTION PHACO AND INTRAOCULAR LENS PLACEMENT (IOC) COMPLICATED DIABETIC LEFT;  Surgeon: Lockie Mola, MD;  Location: [facility];  Service: Ophthalmology;  Laterality: Left;  Diabetic - oral meds   CHOLECYSTECTOMY     COLONOSCOPY  2016   ESOPHAGOGASTRODUODENOSCOPY (EGD) WITH PROPOFOL N/A 04/08/2022   Procedure: ESOPHAGOGASTRODUODENOSCOPY (EGD) WITH PROPOFOL;  Surgeon: Meridee Score Netty Starring., MD;  Location: [facility];  Service: Gastroenterology;  Laterality: N/A;   ESOPHAGOGASTRODUODENOSCOPY (EGD) WITH PROPOFOL N/A 09/12/2022   Procedure: ESOPHAGOGASTRODUODENOSCOPY (EGD) WITH PROPOFOL;  Surgeon: Meridee Score Netty Starring., MD;  Location: [facility];  Service: Gastroenterology;  Laterality: N/A;   ESOPHAGOGASTRODUODENOSCOPY (EGD) WITH PROPOFOL N/A 04/01/2023   Procedure: ESOPHAGOGASTRODUODENOSCOPY (EGD) WITH PROPOFOL;  Surgeon: Meridee Score Netty Starring., MD;  Location: [facility];  Service: Gastroenterology;  Laterality: N/A;   GI RADIOFREQUENCY ABLATION  04/08/2022   Procedure: GI RADIOFREQUENCY ABLATION;  Surgeon: Meridee Score Netty Starring.,  MD;  Location: [facility];  Service: Gastroenterology;;   GI RADIOFREQUENCY ABLATION N/A 09/12/2022   Procedure: GI RADIOFREQUENCY ABLATION;  Surgeon: Lemar Lofty., MD;  Location: [facility];  Service: Gastroenterology;  Laterality: N/A;   GI RADIOFREQUENCY ABLATION  04/01/2023   Procedure: GI RADIOFREQUENCY ABLATION;  Surgeon: Meridee Score Netty Starring., MD;  Location: [facility];  Service: Gastroenterology;;   PORTA CATH INSERTION N/A 11/13/2020   Procedure: PORTA CATH INSERTION;  Surgeon: Annice Needy, MD;  Location: [facility];  Service: Cardiovascular;  Laterality: N/A;   No current facility-administered medications for this encounter.   Facility-Administered Medications Ordered in Other Encounters  Medication Dose Route Frequency Provider Last Rate Last Admin   0.9 % NaCl with KCl 20 mEq/ L  infusion   Intravenous Once Corcoran, Melissa C, MD       heparin lock flush 100 UNIT/ML injection            heparin lock flush 100 unit/mL  500 Units Intravenous Once Rickard Patience, MD       magnesium sulfate IVPB 2 g 50 mL  2 g Intravenous Once Corcoran, Melissa C, MD       sodium chloride flush (NS) 0.9 % injection 10 mL  10 mL Intravenous Once Rickard Patience, MD       No current facility-administered medications for this encounter.  Facility-Administered Medications Ordered in Other Encounters:    0.9 % NaCl with KCl 20 mEq/ L  infusion, , Intravenous, Once, Corcoran, Melissa C, MD   heparin lock flush 100 UNIT/ML injection, , , ,    heparin lock flush 100 unit/mL, 500 Units, Intravenous, Once, Rickard Patience,  MD   magnesium sulfate IVPB 2 g 50 mL, 2 g, Intravenous, Once, Corcoran, Melissa C, MD   sodium chloride flush (NS) 0.9 % injection 10 mL, 10 mL, Intravenous, Once, Rickard Patience, MD No Known Allergies Family History  Problem Relation Age of Onset   Cancer Mother    Colon cancer Neg Hx    Colon polyps Neg Hx    Esophageal cancer Neg Hx    Rectal cancer Neg Hx    Stomach cancer  Neg Hx    Inflammatory bowel disease Neg Hx    Liver disease Neg Hx    Pancreatic cancer Neg Hx    Social History   Socioeconomic History   Marital status: Married    Spouse name: Not on file   Number of children: Not on file   Years of education: Not on file   Highest education level: Not on file  Occupational History   Not on file  Tobacco Use   Smoking status: Former    Current packs/day: 0.00    Types: Cigarettes    Quit date: 38    Years since quitting: 39.1   Smokeless tobacco: Never  Vaping Use   Vaping status: Never Used  Substance and Sexual Activity   Alcohol use: Not Currently    Comment: occasional - 1 glass wine/month   Drug use: Never   Sexual activity: Not on file  Other Topics Concern   Not on file  Social History Narrative   Not on file   Social Drivers of Health   Financial Resource Strain: Patient Declined (11/07/2023)   Received from [facility]   Overall Financial Resource Strain (CARDIA)    Difficulty of Paying Living Expenses: Patient declined  Food Insecurity: Patient Declined (11/07/2023)   Received from [facility]   Hunger Vital Sign    Worried About Running Out of Food in the Last Year: Patient declined    Ran Out of Food in the Last Year: Patient declined  Transportation Needs: Patient Declined (11/07/2023)   Received from [facility]   PRAPARE - Transportation    In the past 12 months, has lack of transportation kept you from medical appointments or from getting medications?: Patient declined    Lack of Transportation (Non-Medical): Patient declined  Physical Activity: Not on file  Stress: Not on file  Social Connections: Not on file  Intimate Partner Violence: Not on file    Physical Exam: There were no vitals filed for this visit. There is no height or weight on file to calculate BMI. GEN: NAD EYE: Sclerae anicteric ENT: MMM CV: Non-tachycardic GI: Soft, NT/ND NEURO:   Alert & Oriented x 3  Lab Results: No results for input(s): "WBC", "HGB", "HCT", "PLT" in the last 72 hours. BMET No results for input(s): "NA", "K", "CL", "CO2", "GLUCOSE", "BUN", "CREATININE", "CALCIUM" in the last 72 hours. LFT No results for input(s): "PROT", "ALBUMIN", "AST", "ALT", "ALKPHOS", "BILITOT", "BILIDIR", "IBILI" in the last 72 hours. PT/INR No results for input(s): "LABPROT", "INR" in the last 72 hours.   Impression / Plan: This is a 78 y.o.male who presents for EGD for history of previous esophageal cancer status post chemo XRT with subsequent Barrett's high-grade dysplasia status post RFA (last RFA 2024).  The risks and benefits of endoscopic evaluation/treatment were discussed with the patient and/or family; these include but are not limited to the risk of perforation, infection, bleeding, missed lesions, lack of diagnosis, severe illness requiring hospitalization,  as well as anesthesia and sedation related illnesses.  The patient's history has been reviewed, patient examined, no change in status, and deemed stable for procedure.  The patient and/or family is agreeable to proceed.    Corliss Parish, MD [facility] Office # [phone]

## 2024-01-05 NOTE — Progress Notes (Signed)
Pt is ready for discharge.  Unable to reach his wife/driver.

## 2024-01-05 NOTE — Anesthesia Postprocedure Evaluation (Signed)
Anesthesia Post Note  Patient: Sayan Aldava.  Procedure(s) Performed: ESOPHAGOGASTRODUODENOSCOPY (EGD) WITH PROPOFOL BIOPSY     Patient location during evaluation: PACU Anesthesia Type: MAC Level of consciousness: awake and alert Pain management: pain level controlled Vital Signs Assessment: post-procedure vital signs reviewed and stable Respiratory status: spontaneous breathing, nonlabored ventilation, respiratory function stable and patient connected to nasal cannula oxygen Cardiovascular status: stable and blood pressure returned to baseline Postop Assessment: no apparent nausea or vomiting Anesthetic complications: no  No notable events documented.  Last Vitals:  Vitals:   01/05/24 1230 01/05/24 1240  BP: (!) 105/38 (!) 126/43  Pulse: (!) 52 (!) 48  Resp: 14 18  Temp:    SpO2: 99% 92%    Last Pain:  Vitals:   01/05/24 1240  TempSrc:   PainSc: 0-No pain                 Shelton Silvas

## 2024-01-05 NOTE — Discharge Instructions (Signed)

## 2024-01-05 NOTE — Transfer of Care (Signed)
Immediate Anesthesia Transfer of Care Note  Patient: Sean Garner.  Procedure(s) Performed: ESOPHAGOGASTRODUODENOSCOPY (EGD) WITH PROPOFOL BIOPSY  Patient Location: PACU  Anesthesia Type:MAC  Level of Consciousness: sedated  Airway & Oxygen Therapy: Patient Spontanous Breathing and Patient connected to face mask oxygen  Post-op Assessment: Report given to RN and Post -op Vital signs reviewed and stable  Post vital signs: Reviewed and stable  Last Vitals:  Vitals Value Taken Time  BP 93/35 01/05/24 1228  Temp 36.2 C 01/05/24 1228  Pulse 52 01/05/24 1229  Resp 14 01/05/24 1229  SpO2 100 % 01/05/24 1229  Vitals shown include unfiled device data.  Last Pain:  Vitals:   01/05/24 1228  TempSrc: Temporal  PainSc: Asleep         Complications: No notable events documented.

## 2024-01-05 NOTE — Op Note (Signed)
[facility] Patient Name: Sean Garner Procedure Date: 01/05/2024 MRN: [id number] Attending MD: Corliss Parish , MD, [id number] Date of Birth: 10-15-1946 CSN: [id number] Age: 78 Admit Type: Outpatient Procedure:                Upper GI endoscopy Indications:              Follow-up of malignant esophageal adenocarcinoma                            (status post chemo XRT), Barrett's esophagus with                            high grade dysplasia thereafter (status post prior                            RFA's x 2) Providers:                Corliss Parish, MD, Eliberto Ivory, RN, Harrington Challenger, Technician Referring MD:              Medicines:                Monitored Anesthesia Care Complications:            No immediate complications. Estimated Blood Loss:     Estimated blood loss was minimal. Procedure:                Pre-Anesthesia Assessment:                           - Prior to the procedure, a History and Physical                            was performed, and patient medications and                            allergies were reviewed. The patient's tolerance of                            previous anesthesia was also reviewed. The risks                            and benefits of the procedure and the sedation                            options and risks were discussed with the patient.                            All questions were answered, and informed consent                            was obtained. Prior Anticoagulants: The patient has                            taken  Eliquis (apixaban), last dose was 2 days                            prior to procedure. ASA Grade Assessment: III - A                            patient with severe systemic disease. After                            reviewing the risks and benefits, the patient was                            deemed in satisfactory condition to undergo the                            procedure.                            After obtaining informed consent, the endoscope was                            passed under direct vision. Throughout the                            procedure, the patient's blood pressure, pulse, and                            oxygen saturations were monitored continuously. The                            [id number]-[id number] ([id number]) Olympus therapeutic endoscope                            was introduced through the mouth, and advanced to                            the second part of duodenum. The upper GI endoscopy                            was accomplished without difficulty. The patient                            tolerated the procedure. Scope In: Scope Out: Findings:      No gross lesions were noted in the proximal esophagus, in the mid       esophagus and in the entire esophagus.      No gross lesions were noted in the distal esophagus (specifically no       evidence of overt Barrett's esophagus). Biopsies were taken with a cold       forceps for histology from the distal 2-3 cm to rule out buried       Barrett's.      The Z-line was irregular and was found 35 cm from the incisors. Biopsies       were taken with a cold forceps for histology to rule out  persisting       Barrett's.      A 5 cm hiatal hernia was present.      Multiple dispersed small erosions with no bleeding and no stigmata of       recent bleeding were found in the entire examined stomach. Biopsies were       taken with a cold forceps for histology and Helicobacter pylori testing.      No gross lesions were noted in the duodenal bulb, in the first portion       of the duodenum and in the second portion of the duodenum. Impression:               - No gross lesions in the proximal esophagus, in                            the mid esophagus and in the entire esophagus.                           - No gross lesions in the distal esophagus.                            Biopsied.                           -  Z-line irregular, 35 cm from the incisors.                            Biopsied.                           - 5 cm hiatal hernia.                           - Erosive gastropathy with no bleeding and no                            stigmata of recent bleeding. Biopsied.                           - No gross lesions in the duodenal bulb, in the                            first portion of the duodenum and in the second                            portion of the duodenum. Moderate Sedation:      Not Applicable - Patient had care per Anesthesia. Recommendation:           - The patient will be observed post-procedure,                            until all discharge criteria are met.                           - Discharge patient to home.                           -  Patient has a contact number available for                            emergencies. The signs and symptoms of potential                            delayed complications were discussed with the                            patient. Return to normal activities tomorrow.                            Written discharge instructions were provided to the                            patient.                           - Resume previous diet and high fiber diet.                           - May restart Eliquis on 2/18 PM to decrease risk                            of post interventional bleeding.                           - Continue present medications.                           - Await pathology results.                           - Repeat upper endoscopy for surveillance based on                            pathology results. If patient has no evidence of                            Barrett's esophagus, then patient may enter into                            surveillance protocol. This would be upper                            endoscopies every 6 months for 2 years and then                            yearly thereafter. With his underlying history of                             esophagus cancer likely would extend further from                            there. If  patient has evidence of Barrett's                            esophagus without high-grade dysplasia, then                            discussion with patient/primary GI/oncology as to                            performing 1 further RFA session before going into                            surveillance protocol can be considered. Will need                            to wait for pathology to return.                           - Indefinite use of PPI unless something else                            changes in future.                           - The findings and recommendations were discussed                            with the patient.                           - The findings and recommendations were discussed                            with the patient's family. Procedure Code(s):        --- Professional ---                           [phone], Esophagogastroduodenoscopy, flexible,                            transoral; with biopsy, single or multiple Diagnosis Code(s):        --- Professional ---                           K22.89, Other specified disease of esophagus                           K44.9, Diaphragmatic hernia without obstruction or                            gangrene                           K31.89, Other diseases of stomach and duodenum  K22.711, Barrett's esophagus with high grade                            dysplasia                           C15.9, Malignant neoplasm of esophagus, unspecified CPT copyright 2022 American Medical Association. All rights reserved. The codes documented in this report are preliminary and upon coder review may  be revised to meet current compliance requirements. Corliss Parish, MD 01/05/2024 12:33:17 PM Number of Addenda: 0

## 2024-01-05 NOTE — Anesthesia Preprocedure Evaluation (Addendum)
Anesthesia Evaluation  Patient identified by MRN, date of birth, ID band Patient awake    Reviewed: Allergy & Precautions, NPO status , Patient's Chart, lab work & pertinent test results  Airway Mallampati: II  TM Distance: >3 FB Neck ROM: Full    Dental  (+) Teeth Intact, Dental Advisory Given   Pulmonary former smoker, PE   breath sounds clear to auscultation       Cardiovascular hypertension, Pt. on medications and Pt. on home beta blockers + DVT   Rhythm:Regular Rate:Normal     Neuro/Psych  PSYCHIATRIC DISORDERS Anxiety     Glaucoma Left foot drop Hx/o Agent Orange exposure negative neurological ROS     GI/Hepatic Neg liver ROS,GERD  Medicated,,Hx/o Barrett's esophagus Hx/o esophageal Ca S/P chemoRx Hx/o Ca base of tongue S/P RT   Endo/Other  diabetes, Poorly Controlled, Type 2, Oral Hypoglycemic Agents    Renal/GU negative Renal ROS  negative genitourinary   Musculoskeletal  (+) Arthritis , Osteoarthritis,    Abdominal   Peds  Hematology  (+) Blood dyscrasia, anemia Eliquis therapy- last dose   Anesthesia Other Findings   Reproductive/Obstetrics                             Anesthesia Physical Anesthesia Plan  ASA: 3  Anesthesia Plan: MAC   Post-op Pain Management: Minimal or no pain anticipated   Induction: Intravenous  PONV Risk Score and Plan: 1 and Treatment may vary due to age or medical condition and Propofol infusion  Airway Management Planned: Natural Airway and Nasal Cannula  Additional Equipment: None  Intra-op Plan:   Post-operative Plan:   Informed Consent: I have reviewed the patients History and Physical, chart, labs and discussed the procedure including the risks, benefits and alternatives for the proposed anesthesia with the patient or authorized representative who has indicated his/her understanding and acceptance.       Plan Discussed with:    Anesthesia Plan Comments:         Anesthesia Quick Evaluation

## 2024-01-06 ENCOUNTER — Encounter (HOSPITAL_COMMUNITY): Payer: Self-pay | Admitting: Gastroenterology

## 2024-01-06 LAB — SURGICAL PATHOLOGY

## 2024-01-07 ENCOUNTER — Encounter: Payer: Self-pay | Admitting: Gastroenterology

## 2024-01-07 ENCOUNTER — Other Ambulatory Visit: Payer: Self-pay

## 2024-01-07 DIAGNOSIS — K22711 Barrett's esophagus with high grade dysplasia: Secondary | ICD-10-CM

## 2024-01-15 ENCOUNTER — Inpatient Hospital Stay: Payer: Medicare Other | Attending: Oncology

## 2024-01-15 DIAGNOSIS — C01 Malignant neoplasm of base of tongue: Secondary | ICD-10-CM | POA: Diagnosis present

## 2024-01-15 DIAGNOSIS — Z87891 Personal history of nicotine dependence: Secondary | ICD-10-CM | POA: Diagnosis not present

## 2024-01-15 DIAGNOSIS — Z452 Encounter for adjustment and management of vascular access device: Secondary | ICD-10-CM | POA: Insufficient documentation

## 2024-01-15 DIAGNOSIS — Z95828 Presence of other vascular implants and grafts: Secondary | ICD-10-CM

## 2024-01-15 MED ORDER — SODIUM CHLORIDE 0.9% FLUSH
10.0000 mL | Freq: Once | INTRAVENOUS | Status: AC
  Administered 2024-01-15: 10 mL via INTRAVENOUS
  Filled 2024-01-15: qty 10

## 2024-01-15 MED ORDER — HEPARIN SOD (PORK) LOCK FLUSH 100 UNIT/ML IV SOLN
500.0000 [IU] | Freq: Once | INTRAVENOUS | Status: AC
  Administered 2024-01-15: 500 [IU] via INTRAVENOUS
  Filled 2024-01-15: qty 5

## 2024-01-16 ENCOUNTER — Telehealth: Payer: Self-pay | Admitting: *Deleted

## 2024-01-16 NOTE — Telephone Encounter (Signed)
 Pt scheduled for CT soft tissue neck and CT chest abd pel on 4/24 and lab/MD on 5/6.   Pt is having a repeat endoscopy on 4/28 and would like to know if CTs and Lab/MD need to be rescheduled or he ok to keep appts as is,  given that he will have the repeat Endoscopy.   Please advise

## 2024-01-16 NOTE — Telephone Encounter (Signed)
 He states that he just had endo. 2 weeks ago and then he has to have another one in April. He feels he will need to r/s scan and the appts are going to be moved. The phone number to speak to him is [phone]

## 2024-01-19 NOTE — Telephone Encounter (Signed)
 Pt and wife requesting for CT scans to be done after Endoscopy. Per Dr.Yu, ok to do CT's approx 1 week after endo.   Please r/s appts and inform pt of appt details.   CT soft tissue neck and CT chest abd pel approx 1 week after 4/28 Port lab / MD 1 week after CT

## 2024-02-14 ENCOUNTER — Other Ambulatory Visit: Payer: Self-pay | Admitting: Dermatology

## 2024-03-09 ENCOUNTER — Telehealth: Payer: Self-pay

## 2024-03-09 ENCOUNTER — Encounter (HOSPITAL_COMMUNITY): Payer: Self-pay | Admitting: Gastroenterology

## 2024-03-09 NOTE — Telephone Encounter (Signed)
 Procedure:endo Procedure date: 03/15/24 Procedure location: wl Arrival Time: 110 Spoke with the patient Y/N: y Any prep concerns? n  Has the patient obtained the prep from the pharmacy ? n Do you have a care partner and transportation: y Any additional concerns? n

## 2024-03-11 ENCOUNTER — Other Ambulatory Visit: Payer: Medicare Other

## 2024-03-11 ENCOUNTER — Ambulatory Visit: Payer: Medicare Other

## 2024-03-15 ENCOUNTER — Encounter (HOSPITAL_COMMUNITY)
Admission: RE | Disposition: A | Discharge status: Home / Self Care | Payer: Self-pay | Source: Home / Self Care | Attending: Gastroenterology

## 2024-03-15 ENCOUNTER — Other Ambulatory Visit: Payer: Self-pay

## 2024-03-15 ENCOUNTER — Ambulatory Visit (HOSPITAL_COMMUNITY)

## 2024-03-15 ENCOUNTER — Telehealth: Payer: Self-pay | Admitting: Gastroenterology

## 2024-03-15 ENCOUNTER — Telehealth: Payer: Self-pay

## 2024-03-15 ENCOUNTER — Encounter (HOSPITAL_COMMUNITY): Payer: Self-pay | Admitting: Gastroenterology

## 2024-03-15 ENCOUNTER — Ambulatory Visit (HOSPITAL_COMMUNITY)
Admission: RE | Admit: 2024-03-15 | Discharge: 2024-03-15 | Disposition: A | Discharge status: Home / Self Care | Payer: Medicare Other | Attending: Gastroenterology | Admitting: Gastroenterology

## 2024-03-15 DIAGNOSIS — M1990 Unspecified osteoarthritis, unspecified site: Secondary | ICD-10-CM | POA: Diagnosis not present

## 2024-03-15 DIAGNOSIS — F419 Anxiety disorder, unspecified: Secondary | ICD-10-CM | POA: Insufficient documentation

## 2024-03-15 DIAGNOSIS — I10 Essential (primary) hypertension: Secondary | ICD-10-CM | POA: Diagnosis not present

## 2024-03-15 DIAGNOSIS — Z85810 Personal history of malignant neoplasm of tongue: Secondary | ICD-10-CM | POA: Diagnosis not present

## 2024-03-15 DIAGNOSIS — Z7984 Long term (current) use of oral hypoglycemic drugs: Secondary | ICD-10-CM | POA: Diagnosis not present

## 2024-03-15 DIAGNOSIS — K2270 Barrett's esophagus without dysplasia: Secondary | ICD-10-CM | POA: Diagnosis present

## 2024-03-15 DIAGNOSIS — K219 Gastro-esophageal reflux disease without esophagitis: Secondary | ICD-10-CM | POA: Diagnosis not present

## 2024-03-15 DIAGNOSIS — E119 Type 2 diabetes mellitus without complications: Secondary | ICD-10-CM | POA: Diagnosis not present

## 2024-03-15 DIAGNOSIS — D649 Anemia, unspecified: Secondary | ICD-10-CM | POA: Insufficient documentation

## 2024-03-15 DIAGNOSIS — K449 Diaphragmatic hernia without obstruction or gangrene: Secondary | ICD-10-CM

## 2024-03-15 DIAGNOSIS — M21372 Foot drop, left foot: Secondary | ICD-10-CM | POA: Diagnosis not present

## 2024-03-15 DIAGNOSIS — K22711 Barrett's esophagus with high grade dysplasia: Secondary | ICD-10-CM

## 2024-03-15 DIAGNOSIS — H409 Unspecified glaucoma: Secondary | ICD-10-CM | POA: Diagnosis not present

## 2024-03-15 DIAGNOSIS — K2289 Other specified disease of esophagus: Secondary | ICD-10-CM

## 2024-03-15 DIAGNOSIS — Z86718 Personal history of other venous thrombosis and embolism: Secondary | ICD-10-CM | POA: Diagnosis not present

## 2024-03-15 DIAGNOSIS — Z7901 Long term (current) use of anticoagulants: Secondary | ICD-10-CM | POA: Insufficient documentation

## 2024-03-15 DIAGNOSIS — Z87891 Personal history of nicotine dependence: Secondary | ICD-10-CM | POA: Insufficient documentation

## 2024-03-15 DIAGNOSIS — Z8501 Personal history of malignant neoplasm of esophagus: Secondary | ICD-10-CM | POA: Insufficient documentation

## 2024-03-15 HISTORY — PX: ESOPHAGOGASTRODUODENOSCOPY (EGD) WITH PROPOFOL: SHX5813

## 2024-03-15 HISTORY — PX: GI RADIOFREQUENCY ABLATION: SHX6807

## 2024-03-15 LAB — GLUCOSE, CAPILLARY: Glucose-Capillary: 185 mg/dL — ABNORMAL HIGH (ref 70–99)

## 2024-03-15 SURGERY — ESOPHAGOGASTRODUODENOSCOPY (EGD) WITH PROPOFOL
Anesthesia: Monitor Anesthesia Care

## 2024-03-15 MED ORDER — LIDOCAINE 2% (20 MG/ML) 5 ML SYRINGE
INTRAMUSCULAR | Status: DC | PRN
  Administered 2024-03-15: 60 mg via INTRAVENOUS

## 2024-03-15 MED ORDER — SODIUM CHLORIDE 0.9 % IV SOLN: INTRAVENOUS | Status: DC

## 2024-03-15 MED ORDER — AMBULATORY NON FORMULARY MEDICATION: 30.0000 mL | Freq: Four times a day (QID) | 2 refills | Status: DC

## 2024-03-15 MED ORDER — SUCRALFATE 1 G PO TABS: 1.0000 g | ORAL_TABLET | Freq: Four times a day (QID) | ORAL | 2 refills | Status: AC

## 2024-03-15 MED ORDER — STERILE WATER FOR INJECTION IJ SOLN
RESPIRATORY_TRACT | Status: DC | PRN
  Administered 2024-03-15: 60 mL via OROMUCOSAL

## 2024-03-15 MED ORDER — LIDOCAINE VISCOUS HCL 2 % MT SOLN: 30.0000 mL | Freq: Four times a day (QID) | OROMUCOSAL | 3 refills | Status: AC

## 2024-03-15 MED ORDER — ONDANSETRON HCL 4 MG/2ML IJ SOLN
INTRAMUSCULAR | Status: DC | PRN
  Administered 2024-03-15: 4 mg via INTRAVENOUS

## 2024-03-15 MED ORDER — ACETYLCYSTEINE 20 % IN SOLN
RESPIRATORY_TRACT | Status: AC
Start: 2024-03-15 — End: ?
  Filled 2024-03-15: qty 4

## 2024-03-15 MED ORDER — LACTATED RINGERS IV SOLN: INTRAVENOUS | Status: DC | PRN

## 2024-03-15 MED ORDER — ACETAMINOPHEN-CODEINE 120-12 MG/5ML PO SOLN
10.0000 mL | Freq: Four times a day (QID) | ORAL | 0 refills | Status: AC | PRN
Start: 2024-03-15 — End: ?

## 2024-03-15 MED ORDER — PROPOFOL 500 MG/50ML IV EMUL
INTRAVENOUS | Status: DC | PRN
  Administered 2024-03-15: 50 mg via INTRAVENOUS
  Administered 2024-03-15: 125 ug/kg/min via INTRAVENOUS

## 2024-03-15 SURGICAL SUPPLY — 13 items
BLOCK BITE 60FR ADLT L/F BLUE (MISCELLANEOUS) ×1 IMPLANT
ELECTRODE REM PT RTRN 9FT ADLT (ELECTROSURGICAL) IMPLANT
FORCEP RJ3 GP 1.8X160 W-NEEDLE (CUTTING FORCEPS) IMPLANT
FORCEPS BIOP RAD 4 LRG CAP 4 (CUTTING FORCEPS) IMPLANT
NDL SCLEROTHERAPY 25GX240 (NEEDLE) IMPLANT
NEEDLE SCLEROTHERAPY 25GX240 (NEEDLE) IMPLANT
PROBE APC STR FIRE (PROBE) IMPLANT
PROBE INJECTION GOLD 7FR (MISCELLANEOUS) IMPLANT
SNARE SHORT THROW 13M SML OVAL (MISCELLANEOUS) IMPLANT
SYR 50ML LL SCALE MARK (SYRINGE) IMPLANT
TUBING ENDO SMARTCAP PENTAX (MISCELLANEOUS) ×2 IMPLANT
TUBING IRRIGATION ENDOGATOR (MISCELLANEOUS) ×1 IMPLANT
WATER STERILE IRR 1000ML POUR (IV SOLUTION) IMPLANT

## 2024-03-15 NOTE — Anesthesia Preprocedure Evaluation (Signed)
 Anesthesia Evaluation  Patient identified by MRN, date of birth, ID band Patient awake    Reviewed: Allergy & Precautions, NPO status , Patient's Chart, lab work & pertinent test results  Airway Mallampati: II  TM Distance: >3 FB Neck ROM: Full    Dental  (+) Teeth Intact, Dental Advisory Given   Pulmonary former smoker, PE   breath sounds clear to auscultation       Cardiovascular hypertension, Pt. on medications and Pt. on home beta blockers + DVT   Rhythm:Regular Rate:Normal     Neuro/Psych  PSYCHIATRIC DISORDERS Anxiety     Glaucoma Left foot drop Hx/o Agent Orange exposure negative neurological ROS     GI/Hepatic Neg liver ROS,GERD  Medicated,,Hx/o Barrett's esophagus Hx/o esophageal Ca S/P chemoRx Hx/o Ca base of tongue S/P RT   Endo/Other  diabetes, Poorly Controlled, Type 2, Oral Hypoglycemic Agents    Renal/GU negative Renal ROS  negative genitourinary   Musculoskeletal  (+) Arthritis , Osteoarthritis,    Abdominal   Peds  Hematology  (+) Blood dyscrasia, anemia Eliquis therapy- last dose   Anesthesia Other Findings   Reproductive/Obstetrics                             Anesthesia Physical Anesthesia Plan  ASA: 3  Anesthesia Plan: MAC   Post-op Pain Management: Minimal or no pain anticipated   Induction: Intravenous  PONV Risk Score and Plan: 1 and Treatment may vary due to age or medical condition and Propofol infusion  Airway Management Planned: Natural Airway and Nasal Cannula  Additional Equipment: None  Intra-op Plan:   Post-operative Plan:   Informed Consent: I have reviewed the patients History and Physical, chart, labs and discussed the procedure including the risks, benefits and alternatives for the proposed anesthesia with the patient or authorized representative who has indicated his/her understanding and acceptance.       Plan Discussed with:    Anesthesia Plan Comments:         Anesthesia Quick Evaluation

## 2024-03-15 NOTE — Telephone Encounter (Signed)
Recall has been entered  

## 2024-03-15 NOTE — Telephone Encounter (Signed)
 Spoke with the pt wife and she states [facility] will not fill the GI cocktail. I have sent to [facility] for her to pick up.

## 2024-03-15 NOTE — Anesthesia Postprocedure Evaluation (Signed)
 Anesthesia Post Note  Patient: Sean Garner.  Procedure(s) Performed: ESOPHAGOGASTRODUODENOSCOPY (EGD) WITH PROPOFOL  RADIOFREQUENCY ABLATION, UPPER GI TRACT, ENDOSCOPIC     Patient location during evaluation: PACU Anesthesia Type: MAC Level of consciousness: awake and alert Pain management: pain level controlled Vital Signs Assessment: post-procedure vital signs reviewed and stable Respiratory status: spontaneous breathing, nonlabored ventilation, respiratory function stable and patient connected to nasal cannula oxygen Cardiovascular status: stable and blood pressure returned to baseline Postop Assessment: no apparent nausea or vomiting Anesthetic complications: no  No notable events documented.  Last Vitals:  Vitals:   03/15/24 1110 03/15/24 1120  BP: (!) 153/79 (!) 154/61  Pulse: (!) 56 (!) 54  Resp: 16 17  Temp:    SpO2: 94% 95%    Last Pain:  Vitals:   03/15/24 1120  TempSrc:   PainSc: 0-No pain                 Melvenia Stabs

## 2024-03-15 NOTE — Telephone Encounter (Signed)
-----   Message from [facility] sent at 03/15/2024  4:17 PM EDT ----- Regarding: Follow-up EGD Sean Garner, This patient needs a 13-month EGD recall for RFA ablation Barrett's esophagus, history esophagus cancer. Thanks. GM

## 2024-03-15 NOTE — Telephone Encounter (Signed)
 Inbound call from patient's wife, would like to speak to [facility] in regards to GI cocktail, she states she has been unable to retrieve medication.

## 2024-03-15 NOTE — Discharge Instructions (Signed)

## 2024-03-15 NOTE — Op Note (Addendum)
 [facility] Patient Name: Sean Garner Procedure Date: 03/15/2024 MRN: [id number] Attending MD: Yong Henle , MD, [id number] Date of Birth: Sep 16, 1946 CSN: [id number] Age: 78 Admit Type: Outpatient Procedure:                Upper GI endoscopy Indications:              For endoscopic therapy of Barrett's esophagus                            without dysplasia (Z-line with intestinal                            metaplasia on last endoscopy), For endoscopic                            therapy of Barrett's esophagus with history of                            prior high grade dysplasia, Personal history of                            malignant esophageal neoplasm Providers:                Yong Henle, MD, Ambrose Junk, RN, Marvelyn Slim, Technician Referring MD:              Medicines:                Monitored Anesthesia Care Complications:            No immediate complications. Estimated Blood Loss:     Estimated blood loss was minimal. Procedure:                Pre-Anesthesia Assessment:                           - Prior to the procedure, a History and Physical                            was performed, and patient medications and                            allergies were reviewed. The patient's tolerance of                            previous anesthesia was also reviewed. The risks                            and benefits of the procedure and the sedation                            options and risks were discussed with the patient.                            All questions were  answered, and informed consent                            was obtained. Prior Anticoagulants: The patient has                            taken Eliquis  (apixaban ), last dose was 2 days                            prior to procedure. ASA Grade Assessment: III - A                            patient with severe systemic disease. After                            reviewing  the risks and benefits, the patient was                            deemed in satisfactory condition to undergo the                            procedure.                           After obtaining informed consent, the endoscope was                            passed under direct vision. Throughout the                            procedure, the patient's blood pressure, pulse, and                            oxygen saturations were monitored continuously. The                            [id number]-H190 ([id number]) Olympus endoscope was introduced                            through the mouth, and advanced to the second part                            of duodenum. The upper GI endoscopy was                            accomplished without difficulty. The patient                            tolerated the procedure. Scope In: Scope Out: Findings:      No gross lesions were noted in the proximal esophagus, in the mid       esophagus and in the distal esophagus.      The Z-line was irregular and was found 36 cm from the incisors. At last       endoscopy where there  appeared to be no further evidence of overt       Barrett's esophagus, the Z-line was sampled and returned showing       evidence of intestinal metaplasia. Thus decision to bring patient back       rather than beginning surveillance. Focal radiofrequency ablation of       Barrett's esophagus was performed. With the endoscope in place, the       position and extent of the Barrett's mucosa and the anatomic landmarks       including top of gastric folds were noted. Endoscopic visualization       identified an ablation site extending from 35 to 38 cm from the       incisors. The Barrett's mucosa was irrigated with N-acetylcysteine        (Mucomyst ) 1% mixed with water . Gastric and esophageal contents were       suctioned. The endoscope was then removed from the patient. The Barrx-90       radiofrequency ablation catheter was attached to the tip of the        endoscope. The endoscope with the attached radiofrequency ablation       catheter was then passed transorally under direct vision into the       esophagus and advanced to the areas of Barrett's mucosa. The       radiofrequency ablation catheter was placed in contact with the surface       of the Barrett's mucosa under direct visualization and energy was       applied twice at 12 J/cm2. Ablation was repeated in a likewise fashion       to treat circumferentially around the squamocolumnar junction. The       ablation zone was cleaned of coagulative debris. The ablation catheter       and endoscope were then removed and the catheter was cleaned. The       catheter and endoscope were reinserted into the esophagus. A second       round of ablation was then performed. Energy was applied twice at 12       J/cm2 to retreat the areas of Barrett's epithelium that had been treated       with the first series of ablation. The areas of the esophagus where       Barrett's mucosa had been ablated were examined. Whitish changes of       ablated mucosa were present. The total number of energy applications for       all mucosal sites treated was 36. There was no evidence for perforation.      A 5 cm hiatal hernia was present.      A large amount of food (residue) was found in the entire examined       stomach.      No gross lesions were noted in the duodenal bulb, in the first portion       of the duodenum and in the second portion of the duodenum. Impression:               - No gross lesions in the proximal esophagus, in                            the mid esophagus and in the distal esophagus.                           -  Z-line irregular, 36 cm from the incisors.                            Treated with radiofrequency ablation (due to prior                            biopsy showing evidence of intestinal metaplasia in                            his history of prior cancer and prior high-grade                             dysplasia).                           - 5 cm hiatal hernia.                           - A large amount of food (residue) in the stomach.                           - No gross lesions in the duodenal bulb, in the                            first portion of the duodenum and in the second                            portion of the duodenum. Moderate Sedation:      Not Applicable - Patient had care per Anesthesia. Recommendation:           - The patient will be observed post-procedure,                            until all discharge criteria are met.                           - Discharge patient to home.                           - Patient has a contact number available for                            emergencies. The signs and symptoms of potential                            delayed complications were discussed with the                            patient. Return to normal activities tomorrow.                            Written discharge instructions were provided to the  patient.                           - Clear liquid diet for 24 hours. Then, full liquid                            diet for 24-48 hours. Then, soft diet for 1 week                            thereafter. Then advance your diet thereafter to                            regular.                           - Continue your PPI 40 mg twice daily for the next                            3 months.                           - You will be prescribed a lidocaine /antiacid                            mixture that you can take up to 4 times per day as                            needed (prescription has been sent to your                            pharmacy) - take no matter what for at least first                            3-days.                           - You will be prescribed liquid Tylenol  with                            codeine  that you can use up to 4 times daily for                             the next 72 hours if needed (prescription to be                            sent to your pharmacy) - I recommend using this                            even if you do not have pain for the first 2-3 days.                           - You will be prescribed Sucralfate  Suspension and  you will take this 4 times daily (make sure you are                            not taking any other medication 1 hour before or 1                            hour after this medication is taken) to allow                            coating and healing of your esophagus - this needs                            to be taken for at least 70-month (you should have                            refills available but call if you run out).                           - May restart Eliquis  on 4/30 PM to decrease risk                            of post-interventional bleeding.                           - Repeat upper endoscopy in 4 months per protocol.                           - The findings and recommendations were discussed                            with the patient.                           - The findings and recommendations were discussed                            with the patient's family. Procedure Code(s):        --- Professional ---                           [id number], Esophagogastroduodenoscopy, flexible,                            transoral; with ablation of tumor(s), polyp(s), or                            other lesion(s) (includes pre- and post-dilation                            and guide wire passage, when performed) Diagnosis Code(s):        --- Professional ---                           K22.89, Other  specified disease of esophagus                           K44.9, Diaphragmatic hernia without obstruction or                            gangrene                           K22.711, Barrett's esophagus with high grade                            dysplasia                           Z85.01, Personal  history of malignant neoplasm of                            esophagus CPT copyright 2022 [employer]. All rights reserved. The codes documented in this report are preliminary and upon coder review may  be revised to meet current compliance requirements. Yong Henle, MD 03/15/2024 10:58:23 AM Number of Addenda: 0

## 2024-03-15 NOTE — Transfer of Care (Signed)
 Immediate Anesthesia Transfer of Care Note  Patient: Sean Garner.  Procedure(s) Performed: ESOPHAGOGASTRODUODENOSCOPY (EGD) WITH PROPOFOL  RADIOFREQUENCY ABLATION, UPPER GI TRACT, ENDOSCOPIC  Patient Location: PACU  Anesthesia Type:MAC  Level of Consciousness: awake and sedated  Airway & Oxygen Therapy: Patient Spontanous Breathing and Patient connected to face mask oxygen  Post-op Assessment: Report given to RN and Post -op Vital signs reviewed and stable  Post vital signs: Reviewed and stable  Last Vitals:  Vitals Value Taken Time  BP 161/82   Temp    Pulse 64 03/15/24 1050  Resp 23 03/15/24 1050  SpO2 96 % 03/15/24 1050  Vitals shown include unfiled device data.  Last Pain:  Vitals:   03/15/24 0913  TempSrc: Temporal  PainSc: 0-No pain         Complications: No notable events documented.

## 2024-03-15 NOTE — H&P (Signed)
 GASTROENTEROLOGY PROCEDURE H&P NOTE   Primary Care Physician: Nestor Banter, MD  HPI: Sean Garner. is a 78 y.o. male who presents for EGD for Barrett's esophagus RFA in setting of previous HGD and history of esophageal cancer.  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   Hypercholesterolemia    Hypertension    Past Surgical History:  Procedure Laterality Date   BIOPSY  01/05/2024   Procedure: BIOPSY;  Surgeon: Brice Campi Albino Alu., MD;  Location: [facility];  Service: Gastroenterology;;   CATARACT EXTRACTION W/PHACO Left 12/03/2017   Procedure: CATARACT EXTRACTION PHACO AND INTRAOCULAR LENS PLACEMENT (IOC) COMPLICATED DIABETIC LEFT;  Surgeon: Annell Kidney, MD;  Location: [facility];  Service: Ophthalmology;  Laterality: Left;  Diabetic - oral meds   CHOLECYSTECTOMY     COLONOSCOPY  2016   ESOPHAGOGASTRODUODENOSCOPY (EGD) WITH PROPOFOL  N/A 04/08/2022   Procedure: ESOPHAGOGASTRODUODENOSCOPY (EGD) WITH PROPOFOL ;  Surgeon: Brice Campi Albino Alu., MD;  Location: [facility];  Service: Gastroenterology;  Laterality: N/A;   ESOPHAGOGASTRODUODENOSCOPY (EGD) WITH PROPOFOL  N/A 09/12/2022   Procedure: ESOPHAGOGASTRODUODENOSCOPY (EGD) WITH PROPOFOL ;  Surgeon: Brice Campi Albino Alu., MD;  Location: [facility];  Service: Gastroenterology;  Laterality: N/A;   ESOPHAGOGASTRODUODENOSCOPY (EGD) WITH PROPOFOL  N/A 04/01/2023   Procedure: ESOPHAGOGASTRODUODENOSCOPY (EGD) WITH PROPOFOL ;  Surgeon: Brice Campi Albino Alu., MD;  Location: [facility];  Service: Gastroenterology;  Laterality: N/A;   ESOPHAGOGASTRODUODENOSCOPY (EGD)  WITH PROPOFOL  N/A 01/05/2024   Procedure: ESOPHAGOGASTRODUODENOSCOPY (EGD) WITH PROPOFOL ;  Surgeon: Brice Campi Albino Alu., MD;  Location: [facility];  Service: Gastroenterology;  Laterality: N/A;   GI RADIOFREQUENCY ABLATION  04/08/2022   Procedure: GI RADIOFREQUENCY ABLATION;  Surgeon: Brice Campi Albino Alu., MD;  Location: [facility];  Service: Gastroenterology;;   GI RADIOFREQUENCY ABLATION N/A 09/12/2022   Procedure: GI RADIOFREQUENCY ABLATION;  Surgeon: Normie Becton., MD;  Location: [facility];  Service: Gastroenterology;  Laterality: N/A;   GI RADIOFREQUENCY ABLATION  04/01/2023   Procedure: GI RADIOFREQUENCY ABLATION;  Surgeon: Brice Campi Albino Alu., MD;  Location: [facility];  Service: Gastroenterology;;   PORTA CATH INSERTION N/A 11/13/2020   Procedure: PORTA CATH INSERTION;  Surgeon: Celso College, MD;  Location: [facility];  Service: Cardiovascular;  Laterality: N/A;   Current Facility-Administered Medications  Medication Dose Route Frequency Provider Last Rate Last Admin   0.9 %  sodium chloride  infusion   Intravenous Continuous Mansouraty, Marylene Masek Jr., MD       Facility-Administered Medications Ordered in Other Encounters  Medication Dose Route Frequency Provider Last Rate Last Admin   0.9 % NaCl with KCl 20 mEq/ L  infusion   Intravenous Once Corcoran, Melissa C, MD       heparin  lock flush 100 UNIT/ML injection            heparin  lock flush 100 unit/mL  500 Units Intravenous Once Yu, Zhou, MD       magnesium  sulfate IVPB 2 g 50 mL  2 g Intravenous Once Corcoran, Melissa C, MD       sodium chloride  flush (NS) 0.9 % injection 10 mL  10 mL Intravenous Once Timmy Forbes, MD        Current Facility-Administered Medications:    0.9 %  sodium chloride  infusion, , Intravenous, Continuous, Mansouraty, Albino Alu., MD  Facility-Administered Medications Ordered in Other Encounters:    0.9 % NaCl with KCl 20 mEq/ L  infusion, , Intravenous, Once, Corcoran,  Melissa C, MD   heparin  lock flush 100 UNIT/ML injection, , , ,    heparin  lock flush 100 unit/mL, 500 Units, Intravenous, Once, Timmy Forbes, MD   magnesium  sulfate IVPB 2 g 50 mL, 2 g, Intravenous, Once, Corcoran, Melissa C, MD   sodium chloride  flush (NS) 0.9 % injection 10 mL, 10 mL, Intravenous, Once, Timmy Forbes, MD No Known Allergies Family History  Problem Relation Age of Onset   Cancer Mother    Colon cancer Neg Hx    Colon polyps Neg Hx    Esophageal cancer Neg Hx    Rectal cancer Neg Hx    Stomach cancer Neg Hx    Inflammatory bowel disease Neg Hx    Liver disease Neg Hx    Pancreatic cancer Neg Hx    Social History   Socioeconomic History   Marital status: Married    Spouse name: Not on file   Number of children: Not on file   Years of education: Not on file   Highest education level: Not on file  Occupational History   Not on file  Tobacco Use   Smoking status: Former    Current packs/day: 0.00    Types: Cigarettes    Quit date: 68    Years since quitting: 39.3   Smokeless tobacco: Never  Vaping Use   Vaping status: Never Used  Substance and Sexual Activity   Alcohol use: Not Currently    Comment: occasional - 1 glass wine/month   Drug use: Never   Sexual activity: Not on file  Other Topics Concern   Not on file  Social History Narrative   Not on file   Social Drivers of Health   Financial Resource Strain: Patient Declined (11/07/2023)   Received from [facility]   Overall Financial Resource Strain (CARDIA)    Difficulty of Paying Living Expenses: Patient declined  Food Insecurity: Patient Declined (11/07/2023)   Received from [facility]   Hunger Vital Sign    Worried About Running Out of Food in the Last Year: Patient declined    Ran Out of Food in the Last Year: Patient declined  Transportation Needs: Patient Declined (11/07/2023)   Received from [facility]   PRAPARE - Transportation    In  the past 12 months, has lack of transportation kept you from medical appointments or from getting medications?: Patient declined    Lack of Transportation (Non-Medical): Patient declined  Physical Activity: Not on file  Stress: Not on file  Social Connections: Not on file  Intimate Partner Violence: Not on file    Physical Exam: Today's Vitals   03/15/24 0913  BP: (!) 178/76  Pulse: (!) 58  Resp: 12  Temp: (!) 97.1 F (36.2 C)  TempSrc: Temporal  SpO2: 98%  Weight: 88.5 kg  Height: 5\' 10"  (1.778 m)  PainSc: 0-No pain   Body mass index is 27.98 kg/m. GEN: NAD EYE: Sclerae anicteric ENT: MMM CV: Non-tachycardic GI: Soft, NT/ND NEURO:  Alert & Oriented x 3  Lab Results: No results for input(s): "WBC", "HGB", "HCT", "PLT" in the last 72 hours. BMET No  results for input(s): "NA", "K", "CL", "CO2", "GLUCOSE", "BUN", "CREATININE", "CALCIUM" in the last 72 hours. LFT No results for input(s): "PROT", "ALBUMIN", "AST", "ALT", "ALKPHOS", "BILITOT", "BILIDIR", "IBILI" in the last 72 hours. PT/INR No results for input(s): "LABPROT", "INR" in the last 72 hours.   Impression / Plan: This is a 78 y.o.male who presents for EGD for Barrett's esophagus RFA in setting of previous HGD and history of esophageal cancer.  The risks and benefits of endoscopic evaluation/treatment were discussed with the patient and/or family; these include but are not limited to the risk of perforation, infection, bleeding, missed lesions, lack of diagnosis, severe illness requiring hospitalization, as well as anesthesia and sedation related illnesses.  The patient's history has been reviewed, patient examined, no change in status, and deemed stable for procedure.  The patient and/or family is agreeable to proceed.    Yong Henle, MD [facility] Office # [phone]

## 2024-03-17 ENCOUNTER — Encounter (HOSPITAL_COMMUNITY): Payer: Self-pay | Admitting: Gastroenterology

## 2024-03-23 ENCOUNTER — Ambulatory Visit: Payer: Medicare Other | Admitting: Oncology

## 2024-03-23 ENCOUNTER — Other Ambulatory Visit: Payer: Medicare Other

## 2024-03-23 ENCOUNTER — Ambulatory Visit

## 2024-03-23 ENCOUNTER — Ambulatory Visit
Admission: RE | Admit: 2024-03-23 | Discharge: 2024-03-23 | Disposition: A | Discharge status: Home / Self Care | Source: Ambulatory Visit | Attending: Oncology | Admitting: Oncology

## 2024-03-23 DIAGNOSIS — C01 Malignant neoplasm of base of tongue: Secondary | ICD-10-CM | POA: Diagnosis present

## 2024-03-23 DIAGNOSIS — C159 Malignant neoplasm of esophagus, unspecified: Secondary | ICD-10-CM | POA: Diagnosis present

## 2024-03-23 LAB — POCT I-STAT CREATININE: Creatinine, Ser: 1 mg/dL (ref 0.61–1.24)

## 2024-03-23 MED ORDER — IOHEXOL 300 MG/ML  SOLN
100.0000 mL | Freq: Once | INTRAMUSCULAR | Status: AC | PRN
  Administered 2024-03-23: 100 mL via INTRAVENOUS

## 2024-03-23 MED ORDER — SODIUM CHLORIDE 0.9 % IV SOLN: INTRAVENOUS | Status: DC

## 2024-03-30 ENCOUNTER — Inpatient Hospital Stay: Attending: Oncology

## 2024-03-30 ENCOUNTER — Encounter: Payer: Self-pay | Admitting: Oncology

## 2024-03-30 ENCOUNTER — Ambulatory Visit: Payer: Self-pay | Admitting: Oncology

## 2024-03-30 ENCOUNTER — Inpatient Hospital Stay: Admitting: Oncology

## 2024-03-30 VITALS — BP 157/76 | HR 59 | Temp 97.9°F | Resp 18 | Wt 193.0 lb

## 2024-03-30 DIAGNOSIS — C159 Malignant neoplasm of esophagus, unspecified: Secondary | ICD-10-CM

## 2024-03-30 DIAGNOSIS — Z1329 Encounter for screening for other suspected endocrine disorder: Secondary | ICD-10-CM

## 2024-03-30 DIAGNOSIS — Z7901 Long term (current) use of anticoagulants: Secondary | ICD-10-CM | POA: Diagnosis not present

## 2024-03-30 DIAGNOSIS — Z87891 Personal history of nicotine dependence: Secondary | ICD-10-CM | POA: Insufficient documentation

## 2024-03-30 DIAGNOSIS — Z86711 Personal history of pulmonary embolism: Secondary | ICD-10-CM | POA: Insufficient documentation

## 2024-03-30 DIAGNOSIS — C01 Malignant neoplasm of base of tongue: Secondary | ICD-10-CM

## 2024-03-30 DIAGNOSIS — E876 Hypokalemia: Secondary | ICD-10-CM | POA: Insufficient documentation

## 2024-03-30 DIAGNOSIS — Z86718 Personal history of other venous thrombosis and embolism: Secondary | ICD-10-CM | POA: Insufficient documentation

## 2024-03-30 DIAGNOSIS — Z79899 Other long term (current) drug therapy: Secondary | ICD-10-CM | POA: Diagnosis not present

## 2024-03-30 DIAGNOSIS — C160 Malignant neoplasm of cardia: Secondary | ICD-10-CM | POA: Diagnosis not present

## 2024-03-30 DIAGNOSIS — Z95828 Presence of other vascular implants and grafts: Secondary | ICD-10-CM

## 2024-03-30 LAB — CMP (CANCER CENTER ONLY)
ALT: 20 U/L (ref 0–44)
AST: 20 U/L (ref 15–41)
Albumin: 3.9 g/dL (ref 3.5–5.0)
Alkaline Phosphatase: 45 U/L (ref 38–126)
Anion gap: 8 (ref 5–15)
BUN: 16 mg/dL (ref 8–23)
CO2: 26 mmol/L (ref 22–32)
Calcium: 9 mg/dL (ref 8.9–10.3)
Chloride: 102 mmol/L (ref 98–111)
Creatinine: 0.89 mg/dL (ref 0.61–1.24)
GFR, Estimated: >60 mL/min (ref >60)
Glucose, Bld: 295 mg/dL — ABNORMAL HIGH (ref 70–99)
Potassium: 3.2 mmol/L — ABNORMAL LOW (ref 3.5–5.1)
Sodium: 136 mmol/L (ref 135–145)
Total Bilirubin: 1 mg/dL (ref 0.0–1.2)
Total Protein: 6.8 g/dL (ref 6.5–8.1)

## 2024-03-30 LAB — CBC WITH DIFFERENTIAL (CANCER CENTER ONLY)
Abs Immature Granulocytes: 0.03 10*3/uL (ref 0.00–0.07)
Basophils Absolute: 0 10*3/uL (ref 0.0–0.1)
Basophils Relative: 1 %
Eosinophils Absolute: 0 10*3/uL (ref 0.0–0.5)
Eosinophils Relative: 1 %
HCT: 41.3 % (ref 39.0–52.0)
Hemoglobin: 14.4 g/dL (ref 13.0–17.0)
Immature Granulocytes: 1 %
Lymphocytes Relative: 11 %
Lymphs Abs: 0.7 10*3/uL (ref 0.7–4.0)
MCH: 30.6 pg (ref 26.0–34.0)
MCHC: 34.9 g/dL (ref 30.0–36.0)
MCV: 87.7 fL (ref 80.0–100.0)
Monocytes Absolute: 0.3 10*3/uL (ref 0.1–1.0)
Monocytes Relative: 5 %
Neutro Abs: 5 10*3/uL (ref 1.7–7.7)
Neutrophils Relative %: 81 %
Platelet Count: 192 10*3/uL (ref 150–400)
RBC: 4.71 MIL/uL (ref 4.22–5.81)
RDW: 12.7 % (ref 11.5–15.5)
WBC Count: 6.1 10*3/uL (ref 4.0–10.5)
nRBC: 0 % (ref 0.0–0.2)

## 2024-03-30 MED ORDER — HEPARIN SOD (PORK) LOCK FLUSH 100 UNIT/ML IV SOLN
500.0000 [IU] | Freq: Once | INTRAVENOUS | Status: AC
  Administered 2024-03-30: 500 [IU] via INTRAVENOUS
  Filled 2024-03-30: qty 5

## 2024-03-30 MED ORDER — APIXABAN 2.5 MG PO TABS: 2.5000 mg | ORAL_TABLET | Freq: Two times a day (BID) | ORAL | 1 refills | Status: DC

## 2024-03-30 MED ORDER — POTASSIUM CHLORIDE CRYS ER 20 MEQ PO TBCR: 20.0000 meq | EXTENDED_RELEASE_TABLET | Freq: Every day | ORAL | 0 refills | Status: AC

## 2024-03-30 NOTE — Patient Instructions (Signed)

## 2024-03-30 NOTE — Assessment & Plan Note (Signed)
Bilateral pulmonary emboli and RLE DVT - 01/2021 Continue Eliquis to 2.5 mg twice daily for prophylaxis.  

## 2024-03-30 NOTE — Assessment & Plan Note (Signed)
Continue port flush every 6-8 weeks.  

## 2024-03-30 NOTE — Progress Notes (Signed)
 Hematology/Oncology Progress note [phone] [phone]       Chief Complaint: Sean Garner. is a 78 y.o. 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   Assessment/Plan.   Cancer Staging  Carcinoma of base of tongue (HCC) Staging form: Pharynx - HPV-Mediated Oropharynx, AJCC 8th Edition - Clinical stage from 10/11/2020: Stage I (cT1, cN1, cM0, p16+) - Signed by Timmy Forbes, MD on 04/28/2022   Carcinoma of base of tongue (HCC) Stage I right base of tongue carcinoma,+P16   01/11/2021. completed concurrent radiation and cisplatin   CT neck showed non specific thickening of epiglottis.he has no clinical signs of epiglottitis. -observation.  Recommend patient to follow-up  with ENT.  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  10/29/2021, EGD showed no invasive esophagus carcinoma.  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 02/2024 S/p radiofrequency ablation Continue follow up with GI and EGD surveillance and ablation if needed.  CT images were reviewed and discussed with patient. - no recurrence.  New focal area of ground glass opacity left lower lobe -observation.  Repeat CT in 6 months.   Pulmonary embolism, bilateral (HCC) Bilateral pulmonary emboli and RLE DVT - 01/2021 Continue Eliquis  to 2.5 mg twice daily for prophylaxis.   Port-A-Cath in place Continue port flush every 6- 8 weeks.   Hypokalemia Recommend potassium 20meq daily x 3 days    Orders Placed This Encounter  Procedures   CT SOFT TISSUE NECK W CONTRAST    Standing Status:   Future    Expected Date:   09/30/2024    Expiration Date:   03/30/2025    If indicated for the ordered procedure, I authorize the administration of contrast  media per Radiology protocol:   Yes    Does the patient have a contrast media/X-ray dye allergy?:   No    Preferred imaging location?:   [facility]   CT CHEST ABDOMEN PELVIS W CONTRAST    Standing Status:   Future    Expected Date:   09/30/2024    Expiration Date:   03/30/2025    If indicated for the ordered procedure, I authorize the administration of contrast media per Radiology protocol:   Yes    Does the patient have a contrast media/X-ray dye allergy?:   No    Preferred imaging location?:   [facility]    If indicated for the ordered procedure, I authorize the administration of oral contrast media per Radiology protocol:   Yes   TSH    Standing Status:   Future    Expected Date:   03/30/2024    Expiration Date:   03/30/2025   CEA    Standing Status:   Future    Expected Date:   09/30/2024    Expiration Date:   03/30/2025   CBC with Differential (Cancer Center Only)    Standing Status:   Future    Expected Date:   09/30/2024    Expiration Date:   03/30/2025   CMP (Cancer Center only)    Standing Status:   Future    Expected Date:   09/30/2024    Expiration Date:   03/30/2025   TSH    Standing Status:   Future    Expected Date:   09/30/2024    Expiration Date:  03/30/2025    Follow up in 6 months.   I discussed the assessment and treatment plan with the patient.  The patient was provided an opportunity to ask questions and all were answered.  The patient agreed with the plan and demonstrated an understanding of the instructions.  The patient was advised to call back if the symptoms worsen or if the condition fails to improve as anticipated.   Timmy Forbes, MD, PhD [facility] Hematology Oncology 03/30/2024       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HCC)  10/11/2020 Cancer Staging    Staging form: Pharynx - HPV-Mediated Oropharynx, AJCC 8th Edition - Clinical stage from 10/11/2020: Stage I (cT1, cN1, cM0, p16+) - Signed by Timmy Forbes, MD on 04/28/2022 Stage prefix: Initial diagnosis Stage used in treatment planning: Yes National guidelines used in treatment planning: Yes   10/17/2020 Initial Diagnosis   Carcinoma of base of tongue (HCC)   11/20/2020 - 12/27/2020 Chemotherapy   11/20/2020 - 12/27/2020  6 weeks of cisplatin  with concurrent radiation        05/23/2021 - 07/04/2021 Chemotherapy   Patient is on Treatment Plan : ESOPHAGUS Carboplatin /PACLitaxel  weekly x 6 weeks with XRT       Esophageal cancer, stage IB (HCC)  10/25/2020 Imaging   PET scan on 10/25/2020 revealed right tongue base primary with ipsilateral level 2 nodal metastasis. There were no findings of extracervical metastatic disease. There was focal hypermetabolism in the distal esophagus, with possible concurrent soft tissue density lesion. Recommended correlation with endoscopy. There was vague right lower lobe low-level hypermetabolism and ground-glass nodularity, favored minimal infection or aspiration. Incidental findings included aortic atherosclerosis, coronary artery atherosclerosis, emphysema, a tiny hiatal hernia, and prostatomegaly.   11/03/2020 Miscellaneous   11/03/2020 Upper endoscopy on  revealed a malignant esophageal tumor at the gastroesophageal junction. There were esophageal mucosal changes c/w short-segment Barrett's esophagus. There was a 2 cm hiatal hernia. There was gastritis.  Pathology in the esophagus at 36 cm revealed intramucosal adenocarcinoma arising in a background of high grade dysplasia and intestinal metaplasia; pathology at 38 cm revealed adenocarcinoma.   11/15/2020 EUS on  revealed early stage adenocarcinoma arising from Barrett's esophagus.  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      11/16/2020 Initial Diagnosis   Esophageal  cancer, stage IB (HCC)   04/09/2021 Miscellaneous   04/09/2021, patient was seen by Dr. Arbie Knock and underwent EGD. Polypoid tumor was seen at the GE junction, consistent with the location of the previously visualized tumor.  With no significant difference compared to photos from other endoscopies.  The proximal esophagus is involved.  Esophagectomy will be applicable. Biopsy was taken at 38 cm.  Dr. Arbie Knock has reached out radiation oncology Dr. Jacalyn Martin and recommend neoadjuvant chemo and radiation.   05/23/2021 Concurrent Chemotherapy   05/23/2021-07/04/2021 weekly Carboplatin / taxol   + RT finished 07/09/2021   09/12/2021 Imaging    PET showed no signs of increased hypermetabolic activity in the neck to indicate residual head neck cancer.  No hypermetabolism in the esophagus. No signs of metastasis to the neck, chest, abdomen or pelvis.   10/29/2021 Miscellaneous   EGD showed no evidence of residual esophageal tumor today.  Esophageal mucosal changes consistent with short segment Barrett's esophagus.  Biopsied.  2 cm hiatal hernia.  A single gastric polyp,  biopsied.  Pathology showed gastric hyperplastic polyps, Barrett's esophagus, low-grade dysplasia, focally suspicious for high-grade dysplasia.  Evidence of invasive carcinoma was not seen in the submitted biopsies   01/28/2022 Miscellaneous   EGD showed short segment Barrett's disease, distal esophagus at 37 cm biopsy showed low-grade dysplastic and focal high-grade dysplasia.   04/08/2022 Miscellaneous   EGD showed no gross lesions in esophagus approximately.  Esophageal mucosal changes secondary to established long segment Barrett's disease distantly.  Treated with radiofrequency ablation. 4 cm hiatal hernia.  Erythematous mucosa in the stomach.  No gross lesions in the duodenal bulb, first portion of the duodenum and in the second portion of duodenum.   09/12/2022 Miscellaneous   Repeat EGD showed no suspicious lesions.  Patient underwent  additional ablation.   12/17/2022 Imaging   PET scan showed 1. Interval resolution of the hypermetabolism seen in the distal esophagus on the previous study. Uptake in the distal esophagus today is below blood pool levels. 2. No evidence for hypermetabolic metastatic disease in the neck, chest, abdomen, or pelvis. 3. 4-5 mm posterior right middle lobe pulmonary nodule is new in the interval. Nonspecific on this non breath hold exam, dedicated CT chest without contrast recommended for more definitive characterization. 4. Left colonic diverticulosis without diverticulitis. 5. Small right groin hernia contains only fat. 6.  Aortic Atherosclerosis    Imaging      Imaging   CT chest wo contrast  1. 4-5 mm posterior right middle lobe pulmonary nodule identified as new on the prior study has resolved in the interval. 2. Interval development of 3 new nodular ground-glass opacities in the left upper lobe measuring up to 1.6 x 0.7 cm. These are likely infectious/inflammatory. Non-contrast chest CT at 3-6 months is recommended. If the ground glass opacities persist, subsequent management will be based upon the most suspicious findings. This recommendation follows the consensus statement: Guidelines for Management of Incidental Pulmonary Nodules Detected on CT Images: From the Fleischner Society 2017; Radiology 2017; 281: 228-243. 3. Tiny hiatal hernia. 4.  Aortic Atherosclerosis   Esophageal adenocarcinoma (HCC)  04/21/2021 Initial Diagnosis   Esophageal adenocarcinoma (HCC)   05/23/2021 - 07/04/2021 Chemotherapy   Patient is on Treatment Plan : ESOPHAGUS Carboplatin /PACLitaxel  weekly x 6 weeks with XRT          INTERVAL HISTORY Sean Garner. is a 78 y.o. 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  He reports feeling well.  Appetite is fair.  Weight is stable. Denies any swallowing difficulty, nausea vomiting, epigastric pain. Denies fever, sore throat or respiratory distress.  Sometimes he feels tired.    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   Hypercholesterolemia    Hypertension     Past Surgical History:  Procedure Laterality Date   BIOPSY  01/05/2024   Procedure: BIOPSY;  Surgeon: Brice Campi Albino Alu., MD;  Location: [facility];  Service: Gastroenterology;;  CATARACT EXTRACTION W/PHACO Left 12/03/2017   Procedure: CATARACT EXTRACTION PHACO AND INTRAOCULAR LENS PLACEMENT (IOC) COMPLICATED DIABETIC LEFT;  Surgeon: Annell Kidney, MD;  Location[facility];  Service: Ophthalmology;  Laterality: Left;  Diabetic - oral meds   CHOLECYSTECTOMY     COLONOSCOPY  2016   ESOPHAGOGASTRODUODENOSCOPY (EGD) WITH PROPOFOL  N/A 04/08/2022   Procedure: ESOPHAGOGASTRODUODENOSCOPY (EGD) WITH PROPOFOL ;  Surgeon: Normie Becton., MD;  Location: [facility];  Service: Gastroenterology;  Laterality: N/A;   ESOPHAGOGASTRODUODENOSCOPY (EGD) WITH PROPOFOL  N/A 09/12/2022   Procedure: ESOPHAGOGASTRODUODENOSCOPY (EGD) WITH PROPOFOL ;  Surgeon: Brice Campi Albino Alu., MD;  Location: [facility];  Service: Gastroenterology;  Laterality: N/A;   ESOPHAGOGASTRODUODENOSCOPY (EGD) WITH PROPOFOL  N/A 04/01/2023   Procedure: ESOPHAGOGASTRODUODENOSCOPY (EGD) WITH PROPOFOL ;  Surgeon: Brice Campi Albino Alu., MD;  Location: [facility];  Service: Gastroenterology;  Laterality: N/A;   ESOPHAGOGASTRODUODENOSCOPY (EGD) WITH PROPOFOL  N/A 01/05/2024   Procedure: ESOPHAGOGASTRODUODENOSCOPY  (EGD) WITH PROPOFOL ;  Surgeon: Brice Campi Albino Alu., MD;  Location: [facility];  Service: Gastroenterology;  Laterality: N/A;   ESOPHAGOGASTRODUODENOSCOPY (EGD) WITH PROPOFOL  N/A 03/15/2024   Procedure: ESOPHAGOGASTRODUODENOSCOPY (EGD) WITH PROPOFOL ;  Surgeon: Brice Campi Albino Alu., MD;  Location: [facility];  Service: Gastroenterology;  Laterality: N/A;   GI RADIOFREQUENCY ABLATION  04/08/2022   Procedure: GI RADIOFREQUENCY ABLATION;  Surgeon: Brice Campi Albino Alu., MD;  Location: [facility];  Service: Gastroenterology;;   GI RADIOFREQUENCY ABLATION N/A 09/12/2022   Procedure: GI RADIOFREQUENCY ABLATION;  Surgeon: Normie Becton., MD;  Location: [facility];  Service: Gastroenterology;  Laterality: N/A;   GI RADIOFREQUENCY ABLATION  04/01/2023   Procedure: GI RADIOFREQUENCY ABLATION;  Surgeon: Brice Campi Albino Alu., MD;  Location: [facility];  Service: Gastroenterology;;   GI RADIOFREQUENCY ABLATION N/A 03/15/2024   Procedure: RADIOFREQUENCY ABLATION, UPPER GI TRACT, ENDOSCOPIC;  Surgeon: Brice Campi Albino Alu., MD;  Location: [facility];  Service: Gastroenterology;  Laterality: N/A;   PORTA CATH INSERTION N/A 11/13/2020   Procedure: PORTA CATH INSERTION;  Surgeon: Celso College, MD;  Location: [facility];  Service: Cardiovascular;  Laterality: N/A;    Family History  Problem Relation Age of Onset   Cancer Mother    Colon cancer Neg Hx    Colon polyps Neg Hx    Esophageal cancer Neg Hx    Rectal cancer Neg Hx    Stomach cancer Neg Hx    Inflammatory bowel disease Neg Hx    Liver disease Neg Hx    Pancreatic cancer Neg Hx     Social History:  reports that he quit smoking about 39 years ago. His smoking use included cigarettes. He has never used smokeless tobacco. He reports that he does not currently use alcohol. He reports that he does not use drugs. He quit smoking cold [place] in 1986. He smoked 2 packs per day for 20 years. 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 He was exposed to [employer]. He worked with a Architect for 34 years. The patient is accompanied by his wife today.  Allergies: No Known Allergies  Current Medications: Current Outpatient Medications  Medication Sig Dispense Refill   acetaminophen -codeine  120-12 MG/5ML solution Take 10 mLs by mouth every 6 (six) hours as needed for moderate pain (pain score 4-6). 120 mL 0   betamethasone  valerate lotion (VALISONE ) 0.1 % Apply 1 Application topically as directed. qd up to 5 days a week to aa scalp, avoid face, groin, axilla 60 mL 6   brimonidine -timolol  (COMBIGAN ) 0.2-0.5 % ophthalmic solution Place 1 drop  into the left eye 2 (two) times daily.     Calcium Carb-Cholecalciferol (CALCIUM 600 + D PO) Take 1 tablet by mouth daily.     cholecalciferol (VITAMIN D3) 25 MCG (1000 UNIT) tablet Take 1,000 Units by mouth daily.     citalopram  (CELEXA ) 10 MG tablet TAKE 1 TABLET BY MOUTH EVERY DAY 90 tablet 1   GI Cocktail (alum & mag hydroxide, lidocaine , dicyclomine ) oral mixture Take 30 mLs by mouth in the morning, at noon, in the evening, and at bedtime. Medication Name: GI cocktail  90 ml viscous lidocaine  90 ml 10 mg/5 ml dicyclomine  270 ml maalox  Swish and swallow 550 mL 3   glucose blood test strip      lidocaine -prilocaine  (EMLA ) cream Apply small amount to port and cover with saran wrap 1-2 hours prior to port access 30 g 3   loratadine (CLARITIN) 10 MG tablet Take 10 mg by mouth daily with lunch.     losartan (COZAAR) 25 MG tablet Take 25 mg by mouth 2 (two) times daily.     Magnesium  400 MG TABS Take 400 mg by mouth daily.     metFORMIN (GLUCOPHAGE-XR) 500 MG 24 hr tablet Take 500 mg by mouth 2 (two) times daily.     Multiple Vitamin (MULTIVITAMIN) tablet Take 1 tablet by mouth daily.     pantoprazole  (PROTONIX ) 40 MG tablet TAKE 1 TABLET (40 MG TOTAL) BY MOUTH TWICE A DAY BEFORE MEALS 180 tablet 2   Phenylephrine HCl (4-WAY FAST ACTING NA) Place 1 spray  into the nose daily as needed (congestion).     simvastatin  (ZOCOR ) 40 MG tablet Take 40 mg by mouth daily at 6 PM.     sucralfate  (CARAFATE ) 1 g tablet Take 1 tablet (1 g total) by mouth 4 (four) times daily. Crush and dissolve in 15 mL of fluid 120 tablet 2   terbinafine  (LAMISIL ) 250 MG tablet Take 1 tablet (250 mg total) by mouth daily. 30 tablet 0   traZODone  (DESYREL ) 50 MG tablet Take 1 tablet (50 mg total) by mouth at bedtime as needed. for sleep 90 tablet 1   vitamin B-12 (CYANOCOBALAMIN) 1000 MCG tablet Take 1,000 mcg by mouth daily.     apixaban  (ELIQUIS ) 2.5 MG TABS tablet Take 1 tablet (2.5 mg total) by mouth 2 (two) times daily. 180 tablet 1   Current Facility-Administered Medications  Medication Dose Route Frequency Provider Last Rate Last Admin   0.9 %  sodium chloride  infusion  500 mL Intravenous Once Pyrtle, Amber Bail, MD       Facility-Administered Medications Ordered in Other Visits  Medication Dose Route Frequency Provider Last Rate Last Admin   0.9 % NaCl with KCl 20 mEq/ L  infusion   Intravenous Once Corcoran, Melissa C, MD       heparin  lock flush 100 UNIT/ML injection            heparin  lock flush 100 unit/mL  500 Units Intravenous Once Tiera Mensinger, MD       magnesium  sulfate IVPB 2 g 50 mL  2 g Intravenous Once Corcoran, Melissa C, MD       sodium chloride  flush (NS) 0.9 % injection 10 mL  10 mL Intravenous Once Timmy Forbes, MD        Review of Systems  Constitutional: Negative.  Negative for chills, fever, malaise/fatigue and weight loss.  HENT:  Negative for congestion, ear pain and tinnitus.   Eyes: Negative.  Negative for blurred  vision and double vision.  Respiratory: Negative.  Negative for cough, sputum production and shortness of breath.   Cardiovascular:  Negative for chest pain and palpitations.  Gastrointestinal: Negative.  Negative for abdominal pain, constipation, diarrhea, nausea and vomiting.  Genitourinary:  Negative for dysuria, frequency and urgency.   Musculoskeletal:  Negative for back pain and falls.  Skin: Negative.  Negative for rash.  Neurological: Negative.  Negative for weakness and headaches.  Endo/Heme/Allergies: Negative.  Does not bruise/bleed easily.  Psychiatric/Behavioral: Negative.  Negative for depression. The patient is not nervous/anxious and does not have insomnia.    Performance status (ECOG): 1  Vitals Blood pressure (!) 157/76, pulse (!) 59, temperature 97.9 F (36.6 C), temperature source Tympanic, resp. rate 18, weight 193 lb (87.5 kg), SpO2 97%.    Physical Exam Constitutional:      General: He is not in acute distress. HENT:     Head: Normocephalic.     Nose: Nose normal.     Mouth/Throat:     Pharynx: No oropharyngeal exudate.  Eyes:     General: No scleral icterus.    Pupils: Pupils are equal, round, and reactive to light.  Cardiovascular:     Rate and Rhythm: Normal rate.     Heart sounds: No murmur heard. Pulmonary:     Effort: Pulmonary effort is normal. No respiratory distress.     Breath sounds: No wheezing.  Abdominal:     General: Bowel sounds are normal. There is no distension.     Palpations: Abdomen is soft.  Musculoskeletal:        General: Normal range of motion.     Cervical back: Normal range of motion.  Skin:    General: Skin is warm and dry.     Findings: No erythema.  Neurological:     Mental Status: He is alert and oriented to person, place, and time. Mental status is at baseline.     Cranial Nerves: No cranial nerve deficit.     Motor: No abnormal muscle tone.  Psychiatric:        Mood and Affect: Mood and affect normal.     Labs    Latest Ref Rng & Units 03/30/2024   10:29 AM 09/11/2023    9:23 AM 06/05/2023   10:00 AM  CBC  WBC 4.0 - 10.5 K/uL 6.1  4.3  6.5   Hemoglobin 13.0 - 17.0 g/dL 91.4  78.2  95.6   Hematocrit 39.0 - 52.0 % 41.3  38.5  40.5   Platelets 150 - 400 K/uL 192  184  171       Latest Ref Rng & Units 03/30/2024   10:29 AM 03/23/2024   10:32  AM 09/11/2023    9:23 AM  CMP  Glucose 70 - 99 mg/dL 213   086   BUN 8 - 23 mg/dL 16   12   Creatinine 5.78 - 1.24 mg/dL 4.69  6.29  5.28   Sodium 135 - 145 mmol/L 136   133   Potassium 3.5 - 5.1 mmol/L 3.2   3.7   Chloride 98 - 111 mmol/L 102   101   CO2 22 - 32 mmol/L 26   26   Calcium 8.9 - 10.3 mg/dL 9.0   8.4   Total Protein 6.5 - 8.1 g/dL 6.8   6.3   Total Bilirubin 0.0 - 1.2 mg/dL 1.0   0.7   Alkaline Phos 38 - 126 U/L 45   56   AST  15 - 41 U/L 20   19   ALT 0 - 44 U/L 20   21

## 2024-03-30 NOTE — Assessment & Plan Note (Signed)
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 completed concurrent radiation and cisplatin  CT neck showed non specific thickening of epiglottis.he has no clinical signs of epiglottitis. -observation.  Recommend patient to follow-up  with ENT.

## 2024-03-30 NOTE — Assessment & Plan Note (Signed)
Recommend potassium 67meq daily x 3 days.

## 2024-03-30 NOTE — Assessment & Plan Note (Addendum)
 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  10/29/2021, EGD showed no invasive esophagus carcinoma.  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 02/2024 S/p radiofrequency ablation Continue follow up with GI and EGD surveillance and ablation if needed.  CT images were reviewed and discussed with patient. - no recurrence.  New focal area of ground glass opacity left lower lobe -observation.  Repeat CT in 6 months.

## 2024-03-31 LAB — CEA: CEA: 0.8 ng/mL (ref 0.0–4.7)

## 2024-03-31 NOTE — Telephone Encounter (Signed)
-----   Message from Timmy Forbes sent at 03/30/2024  6:29 PM EDT ----- Potassium is low. Recommend potassium chloride  20meq daily x 3 days. Rx was sent please notify patient. Thanks.

## 2024-03-31 NOTE — Telephone Encounter (Signed)
 Called and left message with patient that his potassium is low and that Dr. Wilhelmenia Harada recommends potassium chloride  20mcq daily x 3 days.

## 2024-04-20 ENCOUNTER — Other Ambulatory Visit: Payer: Self-pay | Admitting: Psychiatry

## 2024-04-20 ENCOUNTER — Other Ambulatory Visit: Payer: Self-pay | Admitting: Dermatology

## 2024-04-20 DIAGNOSIS — F411 Generalized anxiety disorder: Secondary | ICD-10-CM

## 2024-04-20 DIAGNOSIS — F5101 Primary insomnia: Secondary | ICD-10-CM

## 2024-05-25 ENCOUNTER — Inpatient Hospital Stay

## 2024-06-09 ENCOUNTER — Inpatient Hospital Stay: Attending: Oncology

## 2024-06-09 DIAGNOSIS — Z452 Encounter for adjustment and management of vascular access device: Secondary | ICD-10-CM | POA: Insufficient documentation

## 2024-06-09 DIAGNOSIS — C159 Malignant neoplasm of esophagus, unspecified: Secondary | ICD-10-CM | POA: Insufficient documentation

## 2024-06-09 DIAGNOSIS — C01 Malignant neoplasm of base of tongue: Secondary | ICD-10-CM | POA: Diagnosis present

## 2024-06-09 DIAGNOSIS — Z87891 Personal history of nicotine dependence: Secondary | ICD-10-CM | POA: Diagnosis not present

## 2024-06-09 DIAGNOSIS — Z95828 Presence of other vascular implants and grafts: Secondary | ICD-10-CM

## 2024-06-09 MED ORDER — HEPARIN SOD (PORK) LOCK FLUSH 100 UNIT/ML IV SOLN
500.0000 [IU] | Freq: Once | INTRAVENOUS | Status: AC
  Administered 2024-06-09: 500 [IU] via INTRAVENOUS
  Filled 2024-06-09: qty 5

## 2024-06-09 MED ORDER — SODIUM CHLORIDE 0.9% FLUSH
10.0000 mL | Freq: Once | INTRAVENOUS | Status: AC
  Administered 2024-06-09: 10 mL via INTRAVENOUS
  Filled 2024-06-09: qty 10

## 2024-06-21 ENCOUNTER — Ambulatory Visit: Payer: Medicare Other | Admitting: Psychiatry

## 2024-06-29 ENCOUNTER — Ambulatory Visit: Admitting: Psychiatry

## 2024-07-09 ENCOUNTER — Other Ambulatory Visit: Payer: Self-pay | Admitting: Dermatology

## 2024-07-09 ENCOUNTER — Other Ambulatory Visit: Payer: Self-pay | Admitting: Psychiatry

## 2024-07-09 DIAGNOSIS — F5101 Primary insomnia: Secondary | ICD-10-CM

## 2024-07-20 ENCOUNTER — Inpatient Hospital Stay: Attending: Oncology

## 2024-07-29 ENCOUNTER — Ambulatory Visit: Admitting: Psychiatry

## 2024-07-29 ENCOUNTER — Encounter: Payer: Self-pay | Admitting: Psychiatry

## 2024-07-29 VITALS — BP 128/84 | HR 59 | Temp 98.4°F | Ht 70.0 in | Wt 198.4 lb

## 2024-07-29 DIAGNOSIS — F5101 Primary insomnia: Secondary | ICD-10-CM

## 2024-07-29 DIAGNOSIS — F4312 Post-traumatic stress disorder, chronic: Secondary | ICD-10-CM | POA: Diagnosis not present

## 2024-07-29 DIAGNOSIS — F411 Generalized anxiety disorder: Secondary | ICD-10-CM

## 2024-07-29 MED ORDER — SERTRALINE HCL 25 MG PO TABS: 25.0000 mg | ORAL_TABLET | Freq: Every day | ORAL | 1 refills | Status: DC

## 2024-07-29 MED ORDER — CITALOPRAM HYDROBROMIDE 10 MG PO TABS: 5.0000 mg | ORAL_TABLET | Freq: Every day | ORAL | Status: DC

## 2024-07-29 NOTE — Progress Notes (Signed)
 [facility] MD OP Progress Note  07/29/2024 4:29 PM Sean Garner.  MRN:  [id number]  Chief Complaint:  Chief Complaint  Patient presents with   Follow-up   Anxiety   Depression   Medication Refill   Discussed the use of AI scribe software for clinical note transcription with the patient, who gave verbal consent to proceed.  History of Present Illness Sean Garner. 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  Increased irritability, especially when managing multiple tasks or responding to demands from his wife, bothers him more now than in previous years. He notes that things get on his nerves more easily and reports that aging contributes to these changes. As a self-described perfectionist, he prefers to focus on one task at a time and finds multitasking stressful.  Occasional mild depressive symptoms affect him, including feeling a little bit depressed at times, particularly when certain activities become more challenging than before. He connects these feelings to the difficulty of completing tasks that were previously easier. He remains active by engaging in yard work, house cleaning, and spending time with family, and he tries to stay busy and maintain order in his environment.  He currently takes citalopram  10 mg daily and has used this medication for a long time. He also takes trazodone  for sleep and finds it effective. He denies suicidal ideation and denies thoughts of harming himself or others.  He maintains social activity by seeing his grandchildren, going to the beach, and fishing, though he notes that he is not as socially active as in the past.  He appeared to be alert, oriented to person place time  situation.  3 word memory immediate 3 out of 3, after 5 minutes 2 out of 3.  Attention and focus seem to be good in session, was able to do calculation.  He previously tried a higher dose of citalopram , which caused drowsiness, leading him to reduce to the current lower dose.  He is interested in trial of sertraline .  He denies any other concerns today.    Visit Diagnosis:    ICD-10-CM   1. GAD (generalized anxiety disorder)  F41.1 citalopram  (CELEXA ) 10 MG tablet    sertraline  (ZOLOFT ) 25 MG tablet    2. Chronic post-traumatic stress disorder (PTSD)  F43.12 sertraline  (ZOLOFT ) 25 MG tablet    3. Primary insomnia  F51.01 citalopram  (CELEXA ) 10 MG tablet      Past Psychiatric History: I have reviewed past psychiatric history from progress note on 01/01/2021.  Past trials of medications like Celexa , trazodone , mirtazapine -nightmares.  Past Medical History:  Past Medical History:  Diagnosis Date   Actinic keratosis    Anxiety    Cancer (HCC)    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   Hypercholesterolemia    Hypertension     Past Surgical History:  Procedure Laterality Date   BIOPSY  01/05/2024   Procedure: BIOPSY;  Surgeon: Wilhelmenia Aloha Garner., MD;  Location:  [facility];  Service: Gastroenterology;;   CATARACT EXTRACTION W/PHACO Left 12/03/2017   Procedure: CATARACT EXTRACTION PHACO AND INTRAOCULAR LENS PLACEMENT (IOC) COMPLICATED DIABETIC LEFT;  Surgeon: Mittie Gaskin, MD;  Location: [facility];  Service: Ophthalmology;  Laterality: Left;  Diabetic - oral meds   CHOLECYSTECTOMY     COLONOSCOPY  2016   ESOPHAGOGASTRODUODENOSCOPY (EGD) WITH PROPOFOL  N/A 04/08/2022   Procedure:  ESOPHAGOGASTRODUODENOSCOPY (EGD) WITH PROPOFOL ;  Surgeon: Wilhelmenia Aloha Garner., MD;  Location: [facility];  Service: Gastroenterology;  Laterality: N/A;   ESOPHAGOGASTRODUODENOSCOPY (EGD) WITH PROPOFOL  N/A 09/12/2022   Procedure: ESOPHAGOGASTRODUODENOSCOPY (EGD) WITH PROPOFOL ;  Surgeon: Wilhelmenia Aloha Garner., MD;  Location: [facility];  Service: Gastroenterology;  Laterality: N/A;   ESOPHAGOGASTRODUODENOSCOPY (EGD) WITH PROPOFOL  N/A 04/01/2023   Procedure: ESOPHAGOGASTRODUODENOSCOPY (EGD) WITH PROPOFOL ;  Surgeon: Wilhelmenia Aloha Garner., MD;  Location: [facility];  Service: Gastroenterology;  Laterality: N/A;   ESOPHAGOGASTRODUODENOSCOPY (EGD) WITH PROPOFOL  N/A 01/05/2024   Procedure: ESOPHAGOGASTRODUODENOSCOPY (EGD) WITH PROPOFOL ;  Surgeon: Wilhelmenia Aloha Garner., MD;  Location: [facility];  Service: Gastroenterology;  Laterality: N/A;   ESOPHAGOGASTRODUODENOSCOPY (EGD) WITH PROPOFOL  N/A 03/15/2024   Procedure: ESOPHAGOGASTRODUODENOSCOPY (EGD) WITH PROPOFOL ;  Surgeon: Wilhelmenia Aloha Garner., MD;  Location: [facility];  Service: Gastroenterology;  Laterality: N/A;   GI RADIOFREQUENCY ABLATION  04/08/2022   Procedure: GI RADIOFREQUENCY ABLATION;  Surgeon: Wilhelmenia Aloha Garner., MD;  Location: [facility];  Service: Gastroenterology;;   GI RADIOFREQUENCY ABLATION N/A 09/12/2022   Procedure: GI RADIOFREQUENCY ABLATION;  Surgeon: Wilhelmenia Aloha Garner., MD;  Location: [facility];  Service: Gastroenterology;  Laterality: N/A;   GI RADIOFREQUENCY ABLATION  04/01/2023   Procedure: GI RADIOFREQUENCY ABLATION;  Surgeon: Wilhelmenia Aloha Garner., MD;  Location: [facility];  Service: Gastroenterology;;   GI RADIOFREQUENCY ABLATION N/A 03/15/2024   Procedure: RADIOFREQUENCY ABLATION, UPPER GI TRACT, ENDOSCOPIC;  Surgeon: Wilhelmenia Aloha Garner., MD;  Location: [facility];  Service: Gastroenterology;  Laterality: N/A;   PORTA CATH INSERTION N/A 11/13/2020   Procedure: PORTA CATH INSERTION;   Surgeon: Marea Selinda RAMAN, MD;  Location: [facility];  Service: Cardiovascular;  Laterality: N/A;    Family Psychiatric History: I have reviewed family psychiatric history from progress note on 01/01/2021.  Family History:  Family History  Problem Relation Age of Onset   Cancer Mother    Colon cancer Neg Hx    Colon polyps Neg Hx    Esophageal cancer Neg Hx    Rectal cancer Neg Hx    Stomach cancer Neg Hx    Inflammatory bowel disease Neg Hx    Liver disease Neg Hx    Pancreatic cancer Neg Hx     Social History: I have reviewed social history from progress note on 01/01/2021. Social History   Socioeconomic History   Marital status: Married    Spouse name: Not on file   Number of children: Not on file   Years of education: Not on file   Highest education level: Not on file  Occupational History   Not on file  Tobacco Use   Smoking status: Former    Current packs/day: 0.00    Types: Cigarettes    Quit date: 25    Years since quitting: 39.7   Smokeless tobacco: Never  Vaping Use   Vaping status: Never Used  Substance and Sexual Activity   Alcohol use: Not Currently    Comment: occasional - 1 glass wine/month   Drug use: Never   Sexual activity: Not on file  Other Topics Concern   Not  on file  Social History Narrative   Not on file   Social Drivers of Health   Financial Resource Strain: Patient Declined (11/07/2023)   Received from [facility]   Overall Financial Resource Strain (CARDIA)    Difficulty of Paying Living Expenses: Patient declined  Food Insecurity: Patient Declined (11/07/2023)   Received from [facility]   Hunger Vital Sign    Within the past 12 months, you worried that your food would run out before you got the money to buy more.: Patient declined    Within the past 12 months, the food you bought just didn't last and you didn't have money to get more.: Patient declined  Transportation Needs: Patient  Declined (11/07/2023)   Received from [facility] - Transportation    In the past 12 months, has lack of transportation kept you from medical appointments or from getting medications?: Patient declined    Lack of Transportation (Non-Medical): Patient declined  Physical Activity: Not on file  Stress: Not on file  Social Connections: Not on file    Allergies: No Known Allergies  Metabolic Disorder Labs: Lab Results  Component Value Date   HGBA1C 8.1 (H) 01/20/2021   MPG 185.77 01/20/2021   No results found for: PROLACTIN No results found for: CHOL, TRIG, HDL, CHOLHDL, VLDL, LDLCALC Lab Results  Component Value Date   TSH 0.577 01/08/2021   TSH 2.077 11/27/2020    Therapeutic Level Labs: No results found for: LITHIUM No results found for: VALPROATE No results found for: CBMZ  Current Medications: Current Outpatient Medications  Medication Sig Dispense Refill   acetaminophen -codeine  120-12 MG/5ML solution Take 10 mLs by mouth every 6 (six) hours as needed for moderate pain (pain score 4-6). 120 mL 0   apixaban  (ELIQUIS ) 2.5 MG TABS tablet Take 1 tablet (2.5 mg total) by mouth 2 (two) times daily. 180 tablet 1   betamethasone  valerate lotion (VALISONE ) 0.1 % Apply 1 Application topically as directed. qd up to 5 days a week to aa scalp, avoid face, groin, axilla 60 mL 6   brimonidine -timolol  (COMBIGAN ) 0.2-0.5 % ophthalmic solution Place 1 drop into the left eye 2 (two) times daily.     Calcium Carb-Cholecalciferol (CALCIUM 600 + D PO) Take 1 tablet by mouth daily.     cholecalciferol (VITAMIN D3) 25 MCG (1000 UNIT) tablet Take 1,000 Units by mouth daily.     GI Cocktail (alum & mag hydroxide, lidocaine , dicyclomine ) oral mixture Take 30 mLs by mouth in the morning, at noon, in the evening, and at bedtime. Medication Name: GI cocktail  90 ml viscous lidocaine  90 ml 10 mg/5 ml dicyclomine  270 ml maalox  Swish and swallow 550 mL 3    glucose blood test strip      lidocaine -prilocaine  (EMLA ) cream Apply small amount to port and cover with saran wrap 1-2 hours prior to port access 30 g 3   loratadine (CLARITIN) 10 MG tablet Take 10 mg by mouth daily with lunch.     losartan (COZAAR) 25 MG tablet Take 25 mg by mouth 2 (two) times daily.     Magnesium  400 MG TABS Take 400 mg by mouth daily.     metFORMIN (GLUCOPHAGE-XR) 500 MG 24 hr tablet Take 500 mg by mouth 2 (two) times daily.     Multiple Vitamin (MULTIVITAMIN) tablet Take 1 tablet by mouth daily.     pantoprazole  (PROTONIX ) 40 MG tablet TAKE 1 TABLET (40 MG  TOTAL) BY MOUTH TWICE A DAY BEFORE MEALS 180 tablet 2   Phenylephrine HCl (4-WAY FAST ACTING NA) Place 1 spray into the nose daily as needed (congestion).     potassium chloride  SA (KLOR-CON  M) 20 MEQ tablet Take 1 tablet (20 mEq total) by mouth daily. 3 tablet 0   sertraline  (ZOLOFT ) 25 MG tablet Take 1 tablet (25 mg total) by mouth daily with breakfast. Start after stopping Citalopram  . 30 tablet 1   simvastatin  (ZOCOR ) 40 MG tablet Take 40 mg by mouth daily at 6 PM.     sucralfate  (CARAFATE ) 1 g tablet Take 1 tablet (1 g total) by mouth 4 (four) times daily. Crush and dissolve in 15 mL of fluid 120 tablet 2   terbinafine  (LAMISIL ) 250 MG tablet Take 1 tablet (250 mg total) by mouth daily. 30 tablet 0   traZODone  (DESYREL ) 50 MG tablet Take 1 tablet (50 mg total) by mouth at bedtime as needed. for sleep ( NEEDS AN APPOINTMENT FOR FUTURE REFILLS) 90 tablet 0   vitamin B-12 (CYANOCOBALAMIN) 1000 MCG tablet Take 1,000 mcg by mouth daily.     citalopram  (CELEXA ) 10 MG tablet Take 0.5 tablets (5 mg total) by mouth daily for 7 days.     Current Facility-Administered Medications  Medication Dose Route Frequency Provider Last Rate Last Admin   0.9 %  sodium chloride  infusion  500 mL Intravenous Once Pyrtle, Gordy HERO, MD       Facility-Administered Medications Ordered in Other Visits  Medication Dose Route Frequency Provider  Last Rate Last Admin   0.9 % NaCl with KCl 20 mEq/ L  infusion   Intravenous Once Corcoran, Melissa C, MD       heparin  lock flush 100 UNIT/ML injection            heparin  lock flush 100 unit/mL  500 Units Intravenous Once Babara Call, MD       magnesium  sulfate IVPB 2 g 50 mL  2 g Intravenous Once Corcoran, Melissa C, MD       sodium chloride  flush (NS) 0.9 % injection 10 mL  10 mL Intravenous Once Babara Call, MD         Musculoskeletal: Strength & Muscle Tone: within normal limits Gait & Station: normal Patient leans: N/A  Psychiatric Specialty Exam: Review of Systems  Psychiatric/Behavioral:  The patient is nervous/anxious.        Irritable    Blood pressure 128/84, pulse (!) 59, temperature 98.4 F (36.9 C), temperature source Temporal, height 5' 10 (1.778 m), weight 198 lb 6.4 oz (90 kg), SpO2 100%.Body mass index is 28.47 kg/m.  General Appearance: Casual  Eye Contact:  Fair  Speech:  Normal Rate  Volume:  Normal  Mood:  Anxious, irritable at times  Affect:  Congruent  Thought Process:  Goal Directed and Descriptions of Associations: Intact  Orientation:  Full (Time, Place, and Person)  Thought Content: Logical   Suicidal Thoughts:  No  Homicidal Thoughts:  No  Memory:  Immediate;   Fair Recent;   Fair Remote;   Fair  Judgement:  Fair  Insight:  Fair  Psychomotor Activity:  Normal  Concentration:  Concentration: Fair and Attention Span: Fair  Recall:  [employer] of Knowledge: Fair  Language: Fair  Akathisia:  No  Handed:  Right  AIMS (if indicated): not done  Assets:  Communication Skills Desire for Improvement Housing Social Support Transportation  ADL's:  Intact  Cognition: WNL  Sleep:  restless  at times due to nocturia   Screenings: AIMS    Flowsheet Row Office Visit from 07/02/2022 in [facility]  AIMS Total Score 0   GAD-7    Flowsheet Row Office Visit from 12/23/2023 in [facility] Office Visit from 07/02/2022 in [facility] Office Visit from 01/30/2022 in [facility] Video Visit from 01/01/2021 in [facility]  Total GAD-7 Score 0 7 2 12    PHQ2-9    Flowsheet Row Office Visit from 07/29/2024 in [facility] Office Visit from 12/23/2023 in [facility] Office Visit from 07/02/2022 in [facility] Office Visit from 01/30/2022 in [facility] Office Visit from 10/22/2021 in [facility]  PHQ-2 Total Score 2 0 1 0 0  PHQ-9 Total Score 7 -- -- -- --   Flowsheet Row Office Visit from 07/29/2024 in [facility] Admission (Discharged) from 03/15/2024 in [facility] Admission (Discharged) from 01/05/2024 in [facility]  C-SSRS RISK CATEGORY No Risk No Risk No Risk     Assessment and Plan: Sean Garneris a 78 year old Caucasian male presented for follow-up appointment discussed assessment and plan as noted below.  Generalized anxiety disorder-unstable Currently struggling with irritability, easily getting frustrated and is interested in trial of sertraline . Taper off Citalopram , start taking 5 mg for 7 days and stop taking it. Start Sertraline  25 mg daily with breakfast after stopping citalopram .  PTSD-stable Currently denies any significant concerns Continue Trazodone  50 mg at bedtime as needed for sleep  Primary insomnia-stable Overall sleep is good except when interrupted when he has to void at night. Continue sleep hygiene techniques Continue Trazodone  50 mg at bedtime as needed  Reviewed labs including CMP, CBC with differential dated  06/08/2024, glucose elevated at 195, RBC low at 4.59, lymphocytes low at 0.76, neutrophils elevated at 80.1 otherwise within normal limits.  Will consider repeating it in the future.  Follow-up Follow-up in clinic in 4 weeks or sooner if needed.   Consent: Patient/Guardian gives verbal consent for treatment and assignment of benefits for services provided during this visit. Patient/Guardian expressed understanding and agreed to proceed.   This note was generated in part or whole with voice recognition software. Voice recognition is usually quite accurate but there are transcription errors that can and very often do occur. I apologize for any typographical errors that were not detected and corrected.    Lakin Rhine, MD 07/29/2024, 4:29 PM

## 2024-07-29 NOTE — Patient Instructions (Signed)
 Sertraline Tablets What is this medication? SERTRALINE (SER tra leen) treats depression, anxiety, obsessive-compulsive disorder (OCD), post-traumatic stress disorder (PTSD), and premenstrual dysphoric disorder (PMDD). It increases the amount of serotonin in the brain, a hormone that helps regulate mood. It belongs to a group of medications called SSRIs. This medicine may be used for other purposes; ask your health care provider or pharmacist if you have questions. COMMON BRAND NAME(S): Zoloft What should I tell my care team before I take this medication? They need to know if you have any of these conditions: Bleeding disorders Bipolar disorder or a family history of bipolar disorder Frequently drink alcohol Glaucoma Heart disease High blood pressure History of irregular heartbeat History of low levels of calcium, magnesium, or potassium in the blood Liver disease Receiving electroconvulsive therapy Seizures Suicidal thoughts, plans, or attempt by you or a family member Take medications that prevent or treat blood clots Thyroid disease An unusual or allergic reaction to sertraline, other medications, foods, dyes, or preservatives Pregnant or trying to get pregnant Breastfeeding How should I use this medication? Take this medication by mouth with a glass of water. Take it as directed on the prescription label at the same time every day. You can take it with or without food. If it upsets your stomach, take it with food. Do not take your medication more often than directed. Keep taking this medication unless your care team tells you to stop. Stopping it too quickly can cause serious side effects. It can also make your condition worse. A special MedGuide will be given to you by the pharmacist with each prescription and refill. Be sure to read this information carefully each time. Talk to your care team about the use of this medication in children. While it may be prescribed for children as  young as 7 years for selected conditions, precautions do apply. Overdosage: If you think you have taken too much of this medicine contact a poison control center or emergency room at once. NOTE: This medicine is only for you. Do not share this medicine with others. What if I miss a dose? If you miss a dose, take it as soon as you can. If it is almost time for your next dose, take only that dose. Do not take double or extra doses. What may interact with this medication? Do not take this medication with any of the following: Cisapride Dronedarone Linezolid MAOIs, such as Carbex, Eldepryl, Marplan, Nardil, and Parnate Methylene blue (injected into a vein) Pimozide Thioridazine This medication may also interact with the following: Alcohol Amphetamines Aspirin and aspirin-like medications Certain medications for fungal infections, such as ketoconazole, fluconazole, posaconazole, itraconazole Certain medications for irregular heart beat, such as flecainide, quinidine, propafenone Certain medications for mental health conditions Certain medications for migraine headaches, such as almotriptan, eletriptan, frovatriptan, naratriptan, rizatriptan, sumatriptan, zolmitriptan Certain medications for seizures, such as carbamazepine, valproic acid, phenytoin Certain medications for sleep Certain medications that prevent or treat blood clots, such as warfarin, enoxaparin, dalteparin Cimetidine Digoxin Diuretics Fentanyl Isoniazid Lithium NSAIDs, medications for pain and inflammation, such as ibuprofen or naproxen Other medications that cause heart rhythm changes, such as dofetilide Rasagiline Safinamide Supplements, such as St. John's wort, kava kava, valerian Tolbutamide Tramadol Tryptophan This list may not describe all possible interactions. Give your health care provider a list of all the medicines, herbs, non-prescription drugs, or dietary supplements you use. Also tell them if you smoke,  drink alcohol, or use illegal drugs. Some items may interact with your medicine.  What should I watch for while using this medication? Tell your care team if your symptoms do not get better or if they get worse. Visit your care team for regular checks on your progress. Because it may take several weeks to see the full effects of this medication, it is important to continue your treatment as prescribed by your care team. Patients and their families should watch out for new or worsening thoughts of suicide or depression. Also watch out for sudden changes in feelings such as feeling anxious, agitated, panicky, irritable, hostile, aggressive, impulsive, severely restless, overly excited and hyperactive, or not being able to sleep. If this happens, especially at the beginning of treatment or after a change in dose, call your care team. This medication may affect your coordination, reaction time, or judgment. Do not drive or operate machinery until you know how this medication affects you. Sit or stand up slowly to reduce the risk of dizzy or fainting spells. Drinking alcohol with this medication can increase the risk of these side effects. Your mouth may get dry. Chewing sugarless gum or sucking hard candy, and drinking plenty of water may help. Contact your care team if the problem does not go away or is severe. What side effects may I notice from receiving this medication? Side effects that you should report to your care team as soon as possible: Allergic reactions--skin rash, itching, hives, swelling of the face, lips, tongue, or throat Bleeding--bloody or black, tar-like stools, red or dark brown urine, vomiting blood or brown material that looks like coffee grounds, small red or purple spots on skin, unusual bleeding or bruising Heart rhythm changes--fast or irregular heartbeat, dizziness, feeling faint or lightheaded, chest pain, trouble breathing Low sodium level--muscle weakness, fatigue, dizziness,  headache, confusion Serotonin syndrome--irritability, confusion, fast or irregular heartbeat, muscle stiffness, twitching muscles, sweating, high fever, seizure, chills, vomiting, diarrhea Sudden eye pain or change in vision such as blurred vision, seeing halos around lights, vision loss Thoughts of suicide or self-harm, worsening mood Side effects that usually do not require medical attention (report these to your care team if they continue or are bothersome): Change in sex drive or performance Diarrhea Excessive sweating Nausea Tremors or shaking Upset stomach This list may not describe all possible side effects. Call your doctor for medical advice about side effects. You may report side effects to FDA at 1-800-FDA-1088. Where should I keep my medication? Keep out of the reach of children and pets. Store at room temperature between 20 and 25 degrees C (68 and 77 degrees F). Get rid of any unused medication after the expiration date. To get rid of medications that are no longer needed or expired: Take the medication to a medication take-back program. Check with your pharmacy or law enforcement to find a location. If you cannot return the medication, check the label or package insert to see if the medication should be thrown out in the garbage or flushed down the toilet. If you are not sure, ask your care team. If it is safe to put in the trash, empty the medication out of the container. Mix the medication with cat litter, dirt, coffee grounds, or other unwanted substance. Seal the mixture in a bag or container. Put it in the trash. NOTE: This sheet is a summary. It may not cover all possible information. If you have questions about this medicine, talk to your doctor, pharmacist, or health care provider.  2024 Elsevier/Gold Standard (2022-06-04 00:00:00)

## 2024-08-04 ENCOUNTER — Ambulatory Visit: Payer: Medicare Other | Admitting: Radiation Oncology

## 2024-08-04 ENCOUNTER — Other Ambulatory Visit: Payer: Self-pay | Admitting: Psychiatry

## 2024-08-04 ENCOUNTER — Other Ambulatory Visit: Payer: Self-pay | Admitting: Gastroenterology

## 2024-08-04 ENCOUNTER — Other Ambulatory Visit: Payer: Self-pay | Admitting: Dermatology

## 2024-08-04 DIAGNOSIS — F5101 Primary insomnia: Secondary | ICD-10-CM

## 2024-08-11 ENCOUNTER — Telehealth: Payer: Self-pay | Admitting: Gastroenterology

## 2024-08-11 NOTE — Telephone Encounter (Signed)
 Inbound call from patient requesting a call from Patty to discuss when he needs to have another ablation. Please advise.

## 2024-08-12 ENCOUNTER — Other Ambulatory Visit: Payer: Self-pay

## 2024-08-12 DIAGNOSIS — K22711 Barrett's esophagus with high grade dysplasia: Secondary | ICD-10-CM

## 2024-08-12 NOTE — Telephone Encounter (Signed)
 EGD has been set up for 10/23 at 945 am at [facility] with GM

## 2024-08-12 NOTE — Telephone Encounter (Signed)
 EGD scheduled, pt instructed and medications reviewed.  Patient instructions mailed to home.  Patient to call with any questions or concerns. S

## 2024-08-16 NOTE — Telephone Encounter (Signed)
 PT called to inform us  that we need to get an authorization from the [place] to cover the procedure 10/23.

## 2024-08-19 ENCOUNTER — Other Ambulatory Visit: Payer: Self-pay

## 2024-08-19 ENCOUNTER — Emergency Department (HOSPITAL_COMMUNITY)

## 2024-08-19 ENCOUNTER — Emergency Department (HOSPITAL_COMMUNITY)
Admission: EM | Admit: 2024-08-19 | Discharge: 2024-08-19 | Disposition: A | Discharge status: Home / Self Care | Attending: Emergency Medicine | Admitting: Emergency Medicine

## 2024-08-19 DIAGNOSIS — I6601 Occlusion and stenosis of right middle cerebral artery: Secondary | ICD-10-CM | POA: Diagnosis not present

## 2024-08-19 DIAGNOSIS — E119 Type 2 diabetes mellitus without complications: Secondary | ICD-10-CM | POA: Insufficient documentation

## 2024-08-19 DIAGNOSIS — Z7901 Long term (current) use of anticoagulants: Secondary | ICD-10-CM | POA: Diagnosis not present

## 2024-08-19 DIAGNOSIS — Z8501 Personal history of malignant neoplasm of esophagus: Secondary | ICD-10-CM | POA: Diagnosis not present

## 2024-08-19 DIAGNOSIS — I672 Cerebral atherosclerosis: Secondary | ICD-10-CM | POA: Diagnosis not present

## 2024-08-19 DIAGNOSIS — Z7984 Long term (current) use of oral hypoglycemic drugs: Secondary | ICD-10-CM | POA: Insufficient documentation

## 2024-08-19 DIAGNOSIS — Z79899 Other long term (current) drug therapy: Secondary | ICD-10-CM | POA: Diagnosis not present

## 2024-08-19 DIAGNOSIS — R410 Disorientation, unspecified: Secondary | ICD-10-CM | POA: Diagnosis present

## 2024-08-19 DIAGNOSIS — I10 Essential (primary) hypertension: Secondary | ICD-10-CM

## 2024-08-19 DIAGNOSIS — G9340 Encephalopathy, unspecified: Secondary | ICD-10-CM

## 2024-08-19 DIAGNOSIS — I160 Hypertensive urgency: Secondary | ICD-10-CM | POA: Diagnosis not present

## 2024-08-19 DIAGNOSIS — Z86711 Personal history of pulmonary embolism: Secondary | ICD-10-CM | POA: Insufficient documentation

## 2024-08-19 DIAGNOSIS — R4701 Aphasia: Secondary | ICD-10-CM | POA: Insufficient documentation

## 2024-08-19 LAB — CBC
HCT: 44.7 % (ref 39.0–52.0)
Hemoglobin: 15.6 g/dL (ref 13.0–17.0)
MCH: 31.3 pg (ref 26.0–34.0)
MCHC: 34.9 g/dL (ref 30.0–36.0)
MCV: 89.6 fL (ref 80.0–100.0)
Platelets: 198 K/uL (ref 150–400)
RBC: 4.99 MIL/uL (ref 4.22–5.81)
RDW: 12.8 % (ref 11.5–15.5)
WBC: 7.7 K/uL (ref 4.0–10.5)
nRBC: 0 % (ref 0.0–0.2)

## 2024-08-19 LAB — I-STAT CHEM 8, ED
BUN: 13 mg/dL (ref 8–23)
Calcium, Ion: 1.13 mmol/L — ABNORMAL LOW (ref 1.15–1.40)
Chloride: 102 mmol/L (ref 98–111)
Creatinine, Ser: 0.8 mg/dL (ref 0.61–1.24)
Glucose, Bld: 205 mg/dL — ABNORMAL HIGH (ref 70–99)
HCT: 46 % (ref 39.0–52.0)
Hemoglobin: 15.6 g/dL (ref 13.0–17.0)
Potassium: 3.6 mmol/L (ref 3.5–5.1)
Sodium: 139 mmol/L (ref 135–145)
TCO2: 25 mmol/L (ref 22–32)

## 2024-08-19 LAB — COMPREHENSIVE METABOLIC PANEL WITH GFR
ALT: 23 U/L (ref 0–44)
AST: 24 U/L (ref 15–41)
Albumin: 4.2 g/dL (ref 3.5–5.0)
Alkaline Phosphatase: 56 U/L (ref 38–126)
Anion gap: 14 (ref 5–15)
BUN: 12 mg/dL (ref 8–23)
CO2: 23 mmol/L (ref 22–32)
Calcium: 9.3 mg/dL (ref 8.9–10.3)
Chloride: 101 mmol/L (ref 98–111)
Creatinine, Ser: 0.86 mg/dL (ref 0.61–1.24)
GFR, Estimated: >60 mL/min (ref >60)
Glucose, Bld: 202 mg/dL — ABNORMAL HIGH (ref 70–99)
Potassium: 3.7 mmol/L (ref 3.5–5.1)
Sodium: 138 mmol/L (ref 135–145)
Total Bilirubin: 0.7 mg/dL (ref 0.0–1.2)
Total Protein: 6.8 g/dL (ref 6.5–8.1)

## 2024-08-19 LAB — APTT: aPTT: 32 s (ref 24–36)

## 2024-08-19 LAB — RAPID URINE DRUG SCREEN, HOSP PERFORMED
Amphetamines: NOT DETECTED
Barbiturates: NOT DETECTED
Benzodiazepines: NOT DETECTED
Cocaine: NOT DETECTED
Opiates: NOT DETECTED
Tetrahydrocannabinol: NOT DETECTED

## 2024-08-19 LAB — DIFFERENTIAL
Abs Immature Granulocytes: 0.04 K/uL (ref 0.00–0.07)
Basophils Absolute: 0 K/uL (ref 0.0–0.1)
Basophils Relative: 1 %
Eosinophils Absolute: 0.1 K/uL (ref 0.0–0.5)
Eosinophils Relative: 2 %
Immature Granulocytes: 1 %
Lymphocytes Relative: 15 %
Lymphs Abs: 1.2 K/uL (ref 0.7–4.0)
Monocytes Absolute: 0.5 K/uL (ref 0.1–1.0)
Monocytes Relative: 7 %
Neutro Abs: 5.8 K/uL (ref 1.7–7.7)
Neutrophils Relative %: 74 %

## 2024-08-19 LAB — PROTIME-INR
INR: 1.1 (ref 0.8–1.2)
Prothrombin Time: 14.8 s (ref 11.4–15.2)

## 2024-08-19 LAB — ETHANOL: Alcohol, Ethyl (B): <15 mg/dL (ref <15)

## 2024-08-19 LAB — CBG MONITORING, ED: Glucose-Capillary: 198 mg/dL — ABNORMAL HIGH (ref 70–99)

## 2024-08-19 MED ORDER — IOHEXOL 350 MG/ML SOLN
75.0000 mL | Freq: Once | INTRAVENOUS | Status: AC | PRN
  Administered 2024-08-19: 75 mL via INTRAVENOUS

## 2024-08-19 MED ORDER — LABETALOL HCL 5 MG/ML IV SOLN
5.0000 mg | Freq: Once | INTRAVENOUS | Status: AC
  Administered 2024-08-19: 5 mg via INTRAVENOUS
  Filled 2024-08-19: qty 4

## 2024-08-19 MED ORDER — LABETALOL HCL 5 MG/ML IV SOLN
10.0000 mg | Freq: Once | INTRAVENOUS | Status: AC
  Administered 2024-08-19: 10 mg via INTRAVENOUS
  Filled 2024-08-19: qty 4

## 2024-08-19 NOTE — ED Provider Notes (Signed)
 [place] EMERGENCY DEPARTMENT AT [facility] Provider Note   CSN: [id number] Arrival date & time: 08/19/24  1814  An emergency department physician performed an initial assessment on this suspected stroke patient at 1815.  Patient presents with: Code Stroke   Sean Garner. is a 78 y.o. male.  With a history of hypertension, diabetes, esophageal cancer, and PE on Eliquis  who presents to the ED for confusion.  Last known well time 1500 today.  Since that time patient has had some global confusion noticed by his wife.  Difficulty with word finding.  He also was unsure how to check his point-of-care glucose at home which he is normally able to do.  His wife became concerned and called EMS.  No facial asymmetry, numbness, changes in vision or focal weakness.  Denies chest pain shortness of breath.  Reports compliance with medications including Eliquis  and losartan   HPI     Prior to Admission medications   Medication Sig Start Date End Date Taking? Authorizing Provider  acetaminophen -codeine  120-12 MG/5ML solution Take 10 mLs by mouth every 6 (six) hours as needed for moderate pain (pain score 4-6). 03/15/24   Mansouraty, Aloha Mickey., MD  apixaban  (ELIQUIS ) 2.5 MG TABS tablet Take 1 tablet (2.5 mg total) by mouth 2 (two) times daily. 03/30/24   Babara Call, MD  betamethasone  valerate lotion (VALISONE ) 0.1 % Apply 1 Application topically as directed. qd up to 5 days a week to aa scalp, avoid face, groin, axilla 10/22/23   Hester Alm BROCKS, MD  brimonidine -timolol  (COMBIGAN ) 0.2-0.5 % ophthalmic solution Place 1 drop into the left eye 2 (two) times daily. 06/09/19   [provider]  Calcium Carb-Cholecalciferol (CALCIUM 600 + D PO) Take 1 tablet by mouth daily.    [provider]  cholecalciferol (VITAMIN D3) 25 MCG (1000 UNIT) tablet Take 1,000 Units by mouth daily.    [provider]  citalopram  (CELEXA ) 10 MG tablet Take 0.5 tablets (5 mg total) by mouth  daily for 7 days. 07/29/24 08/05/24  Eappen, Saramma, MD  GI Cocktail (alum & mag hydroxide, lidocaine , dicyclomine ) oral mixture Take 30 mLs by mouth in the morning, at noon, in the evening, and at bedtime. Medication Name: GI cocktail  90 ml viscous lidocaine  90 ml 10 mg/5 ml dicyclomine  270 ml maalox  Swish and swallow 03/15/24   Mansouraty, Gabriel Jr., MD  glucose blood test strip  05/06/17   [provider]  lidocaine -prilocaine  (EMLA ) cream Apply small amount to port and cover with saran wrap 1-2 hours prior to port access 12/04/23   Babara Call, MD  loratadine (CLARITIN) 10 MG tablet Take 10 mg by mouth daily with lunch.    [provider]  losartan (COZAAR) 25 MG tablet Take 25 mg by mouth 2 (two) times daily. 06/04/22   [provider]  Magnesium  400 MG TABS Take 400 mg by mouth daily.    [provider]  metFORMIN (GLUCOPHAGE-XR) 500 MG 24 hr tablet Take 500 mg by mouth 2 (two) times daily.    [provider]  Multiple Vitamin (MULTIVITAMIN) tablet Take 1 tablet by mouth daily.    [provider]  pantoprazole  (PROTONIX ) 40 MG tablet TAKE 1 TABLET BY MOUTH TWICE A DAY BEFORE MEALS 08/05/24   Mansouraty, Gabriel Jr., MD  Phenylephrine HCl (4-WAY FAST ACTING NA) Place 1 spray into the nose daily as needed (congestion).    [provider]  potassium chloride  SA (KLOR-CON  M) 20  MEQ tablet Take 1 tablet (20 mEq total) by mouth daily. 03/30/24   Babara Call, MD  sertraline  (ZOLOFT ) 25 MG tablet Take 1 tablet (25 mg total) by mouth daily with breakfast. Start after stopping Citalopram  . 07/29/24   Eappen, Saramma, MD  simvastatin  (ZOCOR ) 40 MG tablet Take 40 mg by mouth daily at 6 PM.    [provider]  sucralfate  (CARAFATE ) 1 g tablet Take 1 tablet (1 g total) by mouth 4 (four) times daily. Crush and dissolve in 15 mL of fluid 03/15/24 03/15/25  Mansouraty, Aloha Raddle., MD  terbinafine  (LAMISIL ) 250 MG tablet Take 1 tablet (250 mg total)  by mouth daily. 10/22/23   Hester Alm BROCKS, MD  traZODone  (DESYREL ) 50 MG tablet Take 1 tablet (50 mg total) by mouth at bedtime as needed. for sleep ( NEEDS AN APPOINTMENT FOR FUTURE REFILLS) 07/09/24   Eappen, Saramma, MD  vitamin B-12 (CYANOCOBALAMIN) 1000 MCG tablet Take 1,000 mcg by mouth daily.    [provider]    Allergies: Patient has no known allergies.    Review of Systems  Updated Vital Signs BP (!) 172/82   Pulse 69   Temp 98.2 F (36.8 C) (Oral)   Resp 14   Ht 5' 11 (1.803 m)   Wt 88 kg   SpO2 98%   BMI 27.06 kg/m   Physical Exam Vitals and nursing note reviewed.  HENT:     Head: Normocephalic and atraumatic.  Eyes:     Pupils: Pupils are equal, round, and reactive to light.  Cardiovascular:     Rate and Rhythm: Normal rate and regular rhythm.  Pulmonary:     Effort: Pulmonary effort is normal.     Breath sounds: Normal breath sounds.  Abdominal:     Palpations: Abdomen is soft.     Tenderness: There is no abdominal tenderness.  Skin:    General: Skin is warm and dry.  Neurological:     General: No focal deficit present.     Mental Status: He is alert.     Sensory: No sensory deficit.     Motor: No weakness.  Psychiatric:        Mood and Affect: Mood normal.     (all labs ordered are listed, but only abnormal results are displayed) Labs Reviewed  COMPREHENSIVE METABOLIC PANEL WITH GFR - Abnormal; Notable for the following components:      Result Value   Glucose, Bld 202 (*)    All other components within normal limits  I-STAT CHEM 8, ED - Abnormal; Notable for the following components:   Glucose, Bld 205 (*)    Calcium, Ion 1.13 (*)    All other components within normal limits  CBG MONITORING, ED - Abnormal; Notable for the following components:   Glucose-Capillary 198 (*)    All other components within normal limits  ETHANOL  PROTIME-INR  APTT  CBC  DIFFERENTIAL  RAPID URINE DRUG SCREEN, HOSP PERFORMED    EKG: EKG  Interpretation Date/Time:  Thursday August 19 2024 18:50:09 EDT Ventricular Rate:  67 PR Interval:  214 QRS Duration:  98 QT Interval:  432 QTC Calculation: 457 R Axis:   11  Text Interpretation: Sinus rhythm Ventricular premature complex Borderline prolonged PR interval Confirmed by Pamella Sharper [phone]) on 08/19/2024 8:48:52 PM  Radiology: DG Chest Portable 1 View Result Date: 08/19/2024 CLINICAL DATA:  Shortness of breath EXAM: PORTABLE CHEST 1 VIEW COMPARISON:  09/29/2013, CT 09/05/2023 FINDINGS: Right-sided central venous port  tip over the SVC. Mild cardiomegaly with slight central congestion. No focal opacity, pleural effusion, or pneumothorax. Aortic atherosclerosis IMPRESSION: Mild cardiomegaly with slight central congestion. Electronically Signed   By: Luke Bun M.D.   On: 08/19/2024 20:59   CT ANGIO HEAD NECK W WO CM (CODE STROKE) Result Date: 08/19/2024 EXAM: CTA Head and Neck with and without Intravenous Contrast. CT Head without Contrast. CLINICAL HISTORY: Neuro deficit, acute, stroke suspected. Chief complaints; Code STROKE; CT ANGIO HEAD NECK W WO CM (CODE STROKE); Neuro deficit, acute, stroke suspected; 75ml Omni350. TECHNIQUE: Axial CTA images of the head and neck performed with and without intravenous contrast. MIP reconstructed images were created and reviewed. Axial computed tomography images of the head/brain performed without intravenous contrast. Note: Per PQRS, the description of internal carotid artery percent stenosis, including 0 percent or normal exam, is based on [place] American Symptomatic Carotid Endarterectomy Trial (NASCET) criteria. Dose reduction technique was used including one or more of the following: automated exposure control, adjustment of mA and kV according to patient size, and/or iterative reconstruction. CONTRAST: With and without; 75 mL (iohexol  (OMNIPAQUE ) 350 MG/ML injection 75 mL IOHEXOL  350 MG/ML SOLN). COMPARISON: Same day CT head. FINDINGS: CTA  NECK: COMMON CAROTID ARTERIES: Right: Patent from origin to skull base. Tortuosity of the proximal right common carotid artery. Mild atherosclerosis along the common carotid artery without significant stenosis. No dissection or occlusion. Left: Patent from origin to skull base. Tortuosity of the proximal left common carotid artery. Calcified atherosclerosis at the carotid bifurcation without significant stenosis. No dissection or occlusion. INTERNAL CAROTID ARTERIES: Right: Focal mild stenosis of the proximal right cervical ICA. Tortuosity of the mid right cervical ICA. Additional focal mild stenosis of the distal right cervical ICA at the skull base. No dissection or occlusion. Left: Subtle short segment narrowing of the proximal left cervical ICA. Tortuosity of the mid left cervical ICA. No dissection or occlusion. VERTEBRAL ARTERIES: Patent from the origins to the vertebrobasilar confluence. Left: Focal moderate stenosis of the distal V4 segment of the left vertebral artery. No dissection or occlusion. Right: Tapering and irregularity of the distal V4 segment of the right vertebral artery with moderate stenosis. No dissection or occlusion. CTA HEAD: INTRACRANIAL INTERNAL CAROTID ARTERIES: The intracranial internal carotid arteries are patent bilaterally. Atherosclerosis of the carotid siphons. There is mild stenosis of the bilateral cavernous ICAs. Additional mild stenosis at the left ICA terminus. No occlusion. No aneurysm. ANTERIOR CEREBRAL ARTERIES: Diminutive A1 segment of the right ACA. The anterior cerebral arteries are patent bilaterally. No significant stenosis. No occlusion. No aneurysm. MIDDLE CEREBRAL ARTERIES: Right: The right middle cerebral artery is patent. There is severe stenosis of a mid/distal M2 branch of the right MCA seen on series 5 image 106. No occlusion. No aneurysm. Left: Moderate long segment stenosis of the M1 segment of the left MCA. Additional moderate stenosis of a proximal M2  branch of the left MCA. No occlusion. No aneurysm. POSTERIOR CEREBRAL ARTERIES: Right: The right PCA is patent. There is irregularity and moderate-to-severe multifocal stenosis of the P1 and P2 segments of the right PCA. No occlusion. No aneurysm. Left: The left PCA is patent. Moderate stenosis of the left P1 segment. No occlusion. No aneurysm. BASILAR ARTERY: There is irregularity and mild stenosis of the basilar artery. No occlusion. No aneurysm. OTHER: Mild atherosclerosis of the visualized aortic arch. SOFT TISSUES: Right sided chest wall port-a-cath. No masses or lymphadenopathy. BONES: Degenerative changes in the visualized spine. Degenerative endplate changes most pronounced  at C4-C5 and C5-C6. No acute osseous abnormality. IMPRESSION: 1. No large vessel occlusion. 2. 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 3. Moderate long segment stenosis of the M1 segment of the left MCA and additional moderate stenosis of a proximal M2 branch of the left MCA. 4. Moderate-to-severe multifocal stenosis of the P1 and P2 segments of the right PCA. Moderate stenosis of the left P1 segment. 5. Focal moderate stenosis of the distal V4 segment of the left vertebral artery and moderate stenosis of the distal V4 segment of the right vertebral artery. Electronically signed by: Donnice Mania MD 08/19/2024 07:02 PM EDT RP Workstation: [id number]   CT HEAD CODE STROKE WO CONTRAST Result Date: 08/19/2024 EXAM: CT HEAD WITHOUT CONTRAST 08/19/2024 06:26:00 PM TECHNIQUE: CT of the head was performed without the administration of intravenous contrast. Automated exposure control, iterative reconstruction, and/or weight based adjustment of the mA/kV was utilized to reduce the radiation dose to as low as reasonably achievable. COMPARISON: None available. CLINICAL HISTORY: Neuro deficit, acute, stroke suspected. Chief complaints; Code STROKE; CT HEAD CODE STROKE WO CONTRAST; Neuro deficit, acute, stroke suspected.  FINDINGS: BRAIN AND VENTRICLES: No acute hemorrhage. Nonspecific hypoattenuation in the periventricular and subcortical white matter, most likely representing chronic microvascular ischemic changes. No evidence of acute infarct. No hydrocephalus. No extra-axial collection. No mass effect or midline shift. Atherosclerosis of the carotid siphons and intracranial vertebral arteries. [place] stroke program early CT (ASPECT) score: Ganglionic (caudate, IC, Lentiform Nucleus, insula, M1-M3): 7 Supraganglionic (M4-M6): 3 Total: 10 ORBITS: Left lens replacement. No acute abnormality. SINUSES: No acute abnormality. SOFT TISSUES AND SKULL: No acute soft tissue abnormality. No skull fracture. IMPRESSION: 1. No acute intracranial abnormality. 2. Mild chronic microvascular ischemic changes. 3. ASPECTS is 10. 4. Findings messaged to Dr. Lindzen at 6:37PM on 08/19/24. Electronically signed by: Donnice Mania MD 08/19/2024 06:38 PM EDT RP Workstation: [id number]     Procedures   Medications Ordered in the ED  iohexol  (OMNIPAQUE ) 350 MG/ML injection 75 mL (75 mLs Intravenous Contrast Given 08/19/24 1840)  labetalol (NORMODYNE) injection 10 mg (10 mg Intravenous Given 08/19/24 1856)  labetalol (NORMODYNE) injection 5 mg (5 mg Intravenous Given 08/19/24 2055)    Clinical Course as of 08/19/24 2329  Thu Aug 19, 2024  2048 BP down 200 SBP. Will give another 5mg  labetalol [MP]  2310 Blood pressure persistently in 170s systolic here.  Patient feels a bit better.  Remains at neurologic baseline without focal neurologic deficit.  No evidence of endorgan damage on labs.  Counseled him on need for repeat blood pressure measurements at home and close PCP follow-up.  Stable for discharge at this time [MP]    Clinical Course User Index [MP] Pamella Ozell LABOR, DO                                 Medical Decision Making 78 year old male with history as above presented to the ED for acute onset confusion.  Last known well time 1500.   Code stroke activated.  Patient examined with neurology team.  Initial NIH stroke scale score of 0.  Airway intact.  CT head shows no evidence of bleeding.  CTA head and neck show no LVO.  Stenosis of multiple vessels noted.  Was very hypertensive with initial systolic blood pressure in the 230s.  Suspect symptoms may be due to hypertensive emergency/urgency.  Will obtain laboratory workup to look for evidence  of endorgan damage labetalol for blood pressure control.  Amount and/or Complexity of Data Reviewed Labs: ordered. Radiology: ordered.  Risk Prescription drug management.        Final diagnoses:  Aphasia  Hypertensive urgency    ED Discharge Orders     None          Pamella Ozell LABOR, DO 08/19/24 2329

## 2024-08-19 NOTE — ED Triage Notes (Signed)
 LKW was 1500. Pt was having aphasia and could not remember how to check his blood sugar. Pt became worried and asked his spouse to call an ambulance. Pt hypertensive and taking eliquis  for prior PE.

## 2024-08-19 NOTE — Code Documentation (Signed)
 Stroke Response Nurse Documentation Code Documentation  Sean Garner. is a 78 y.o. male arriving to [facility]  via [facility] on 08/19/2024 with past medical hx of mouth cancer, PTSD, PE. On Eliquis  (apixaban ) daily. Code stroke was activated by EMS.   Patient from home where he was LKW at 1500 and now complaining of spells of confusion . He reports an inability to work his glucometer, and difficulty driving his car today and yesterday, but currently has no deficits.  Stroke team at the bedside on patient arrival. Labs drawn and patient cleared for CT by Dr. Pamella. Patient to CT with team. NIHSS 0, see documentation for details and code stroke times. Patient with no deficits on exam. The following imaging was completed:  CT Head and CTA. Patient is not a candidate for IV Thrombolytic due to Eliquis . Patient is not a candidate for IR due to no LVO.   Care Plan:   No acute treatment/TIA alert: q2h x 12 hours NIHSS & VS, then q4h. Reactivate if symptoms reoccur.    Bedside handoff with ED RN Edsel.    Iolani Twilley Livengood  Stroke Response RN

## 2024-08-19 NOTE — Consult Note (Addendum)
 NEUROLOGY CONSULT NOTE   Date of service: August 19, 2024 Patient Name: Sean Garner. MRN:  [id number] DOB:  [birth year] Chief Complaint: Transient episodes of confusion and communication deficit Requesting Provider: No att. providers found  History of Present Illness  Sean Rosko. is a 78 y.o. male with a PMHx of actinic keratosis, anxiety, cancer of head and throat, cataracts, PTSD with severe claustrophobia, DM, GERD, glaucoma, hypercholesterolemia, PE on Eliquis  and HTN who presents to the ED as a Code Stroke for new onset of aphasia and apraxia.The patient was having aphasia and could not remember how to check his blood sugar. He became worried and asked his spouse to call an ambulance. Of note, he also had difficulty driving his car today and yesterday. He was hypertensive on arrival.    LKW: 1500 Modified rankin score: 0 IV Thrombolysis:  No: On Eliquis  EVT: No: Presentation not consistent with LVO   NIHSS components Score: Comment  1a Level of Conscious 0[x]  1[]  2[]  3[]      1b LOC Questions 0[x]  1[]  2[]       1c LOC Commands 0[x]  1[]  2[]       2 Best Gaze 0[x]  1[]  2[]       3 Visual 0[x]  1[]  2[]  3[]      4 Facial Palsy 0[x]  1[]  2[]  3[]      5a Motor Arm - left 0[x]  1[]  2[]  3[]  4[]  UN[]    5b Motor Arm - Right 0[x]  1[]  2[]  3[]  4[]  UN[]    6a Motor Leg - Left 0[x]  1[]  2[]  3[]  4[]  UN[]    6b Motor Leg - Right 0[x]  1[]  2[]  3[]  4[]  UN[]    7 Limb Ataxia 0[x]  1[]  2[]  UN[]      8 Sensory 0[x]  1[]  2[]  UN[]      9 Best Language 0[x]  1[]  2[]  3[]      10 Dysarthria 0[x]  1[]  2[]  UN[]      11 Extinct. 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   Hypercholesterolemia    Hypertension    Past Surgical History:  Procedure Laterality Date   BIOPSY  01/05/2024   Procedure: BIOPSY;  Surgeon: Wilhelmenia Aloha Mickey., MD;  Location: [facility];  Service: Gastroenterology;;   CATARACT EXTRACTION W/PHACO Left 12/03/2017   Procedure: CATARACT EXTRACTION PHACO AND INTRAOCULAR LENS PLACEMENT (IOC) COMPLICATED DIABETIC LEFT;  Surgeon: Mittie Gaskin, MD;  Location: [facility];  Service: Ophthalmology;  Laterality: Left;  Diabetic - oral meds   CHOLECYSTECTOMY     COLONOSCOPY  2016   ESOPHAGOGASTRODUODENOSCOPY (EGD) WITH PROPOFOL  N/A 04/08/2022   Procedure: ESOPHAGOGASTRODUODENOSCOPY (EGD) WITH PROPOFOL ;  Surgeon: Wilhelmenia Aloha Mickey., MD;  Location: [facility];  Service: Gastroenterology;  Laterality: N/A;   ESOPHAGOGASTRODUODENOSCOPY (EGD) WITH PROPOFOL  N/A 09/12/2022   Procedure: ESOPHAGOGASTRODUODENOSCOPY (EGD) WITH PROPOFOL ;  Surgeon: Wilhelmenia Aloha Mickey., MD;  Location: [facility];  Service: Gastroenterology;  Laterality:  N/A;   ESOPHAGOGASTRODUODENOSCOPY (EGD) WITH PROPOFOL  N/A 04/01/2023   Procedure: ESOPHAGOGASTRODUODENOSCOPY (EGD) WITH PROPOFOL ;  Surgeon: Wilhelmenia Aloha Raddle., MD;  Location: [facility];  Service: Gastroenterology;  Laterality: N/A;   ESOPHAGOGASTRODUODENOSCOPY (EGD) WITH PROPOFOL  N/A 01/05/2024   Procedure: ESOPHAGOGASTRODUODENOSCOPY (EGD) WITH PROPOFOL ;  Surgeon: Wilhelmenia Aloha Raddle., MD;  Location: [facility];  Service: Gastroenterology;  Laterality: N/A;   ESOPHAGOGASTRODUODENOSCOPY (EGD) WITH PROPOFOL  N/A 03/15/2024   Procedure: ESOPHAGOGASTRODUODENOSCOPY (EGD) WITH PROPOFOL ;  Surgeon: Wilhelmenia Aloha Raddle., MD;  Location: [facility];  Service: Gastroenterology;  Laterality: N/A;   GI RADIOFREQUENCY ABLATION  04/08/2022   Procedure:  GI RADIOFREQUENCY ABLATION;  Surgeon: Wilhelmenia Aloha Raddle., MD;  Location: [facility];  Service: Gastroenterology;;   GI RADIOFREQUENCY ABLATION N/A 09/12/2022   Procedure: GI RADIOFREQUENCY ABLATION;  Surgeon: Wilhelmenia Aloha Raddle., MD;  Location: [facility];  Service: Gastroenterology;  Laterality: N/A;   GI RADIOFREQUENCY ABLATION  04/01/2023   Procedure: GI RADIOFREQUENCY ABLATION;  Surgeon: Wilhelmenia Aloha Raddle., MD;  Location: [facility];  Service: Gastroenterology;;   GI RADIOFREQUENCY ABLATION N/A 03/15/2024   Procedure: RADIOFREQUENCY ABLATION, UPPER GI TRACT, ENDOSCOPIC;  Surgeon: Wilhelmenia Aloha Raddle., MD;  Location: [facility];  Service: Gastroenterology;  Laterality: N/A;   PORTA CATH INSERTION N/A 11/13/2020   Procedure: PORTA CATH INSERTION;  Surgeon: Marea Selinda RAMAN, MD;  Location: [facility];  Service: Cardiovascular;  Laterality: N/A;    Family History: Family History  Problem Relation Age of Onset   Cancer Mother    Colon cancer Neg Hx    Colon polyps Neg Hx    Esophageal cancer Neg Hx    Rectal cancer Neg Hx    Stomach cancer Neg Hx    Inflammatory bowel disease Neg Hx    Liver disease Neg Hx    Pancreatic cancer Neg Hx     Social History  reports that he quit smoking about 39 years ago. His smoking use included cigarettes. He has never used smokeless tobacco. He reports that he does not currently use alcohol. He reports that he does not use drugs.  No Known Allergies  Medications   Current Facility-Administered Medications:    0.9 %  sodium chloride  infusion, 500 mL, Intravenous, Once, Pyrtle, Gordy HERO, MD  Current Outpatient Medications:    acetaminophen -codeine  120-12 MG/5ML solution, Take 10 mLs by mouth every 6 (six) hours as needed for moderate pain (pain score 4-6)., Disp: 120 mL, Rfl: 0   apixaban  (ELIQUIS ) 2.5 MG TABS tablet, Take 1 tablet (2.5 mg total) by mouth 2 (two) times daily., Disp: 180 tablet, Rfl: 1   betamethasone   valerate lotion (VALISONE ) 0.1 %, Apply 1 Application topically as directed. qd up to 5 days a week to aa scalp, avoid face, groin, axilla, Disp: 60 mL, Rfl: 6   brimonidine -timolol  (COMBIGAN ) 0.2-0.5 % ophthalmic solution, Place 1 drop into the left eye 2 (two) times daily., Disp: , Rfl:    Calcium Carb-Cholecalciferol (CALCIUM 600 + D PO), Take 1 tablet by mouth daily., Disp: , Rfl:    cholecalciferol (VITAMIN D3) 25 MCG (1000 UNIT) tablet, Take 1,000 Units by mouth daily., Disp: , Rfl:    citalopram  (CELEXA ) 10 MG tablet, Take 0.5 tablets (5 mg total) by mouth daily for 7 days., Disp: , Rfl:    GI Cocktail (alum & mag hydroxide, lidocaine , dicyclomine ) oral mixture, Take 30 mLs by mouth in the morning, at noon, in the evening, and at bedtime. Medication Name: GI cocktail  90 ml viscous lidocaine  90  ml 10 mg/5 ml dicyclomine  270 ml maalox  Swish and swallow, Disp: 550 mL, Rfl: 3   glucose blood test strip, , Disp: , Rfl:    lidocaine -prilocaine  (EMLA ) cream, Apply small amount to port and cover with saran wrap 1-2 hours prior to port access, Disp: 30 g, Rfl: 3   loratadine (CLARITIN) 10 MG tablet, Take 10 mg by mouth daily with lunch., Disp: , Rfl:    losartan (COZAAR) 25 MG tablet, Take 25 mg by mouth 2 (two) times daily., Disp: , Rfl:    Magnesium  400 MG TABS, Take 400 mg by mouth daily., Disp: , Rfl:    metFORMIN (GLUCOPHAGE-XR) 500 MG 24 hr tablet, Take 500 mg by mouth 2 (two) times daily., Disp: , Rfl:    Multiple Vitamin (MULTIVITAMIN) tablet, Take 1 tablet by mouth daily., Disp: , Rfl:    pantoprazole  (PROTONIX ) 40 MG tablet, TAKE 1 TABLET BY MOUTH TWICE A DAY BEFORE MEALS, Disp: 180 tablet, Rfl: 2   Phenylephrine HCl (4-WAY FAST ACTING NA), Place 1 spray into the nose daily as needed (congestion)., Disp: , Rfl:    potassium chloride  SA (KLOR-CON  M) 20 MEQ tablet, Take 1 tablet (20 mEq total) by mouth daily., Disp: 3 tablet, Rfl: 0   sertraline  (ZOLOFT ) 25 MG tablet, Take 1 tablet (25 mg  total) by mouth daily with breakfast. Start after stopping Citalopram  ., Disp: 30 tablet, Rfl: 1   simvastatin  (ZOCOR ) 40 MG tablet, Take 40 mg by mouth daily at 6 PM., Disp: , Rfl:    sucralfate  (CARAFATE ) 1 g tablet, Take 1 tablet (1 g total) by mouth 4 (four) times daily. Crush and dissolve in 15 mL of fluid, Disp: 120 tablet, Rfl: 2   terbinafine  (LAMISIL ) 250 MG tablet, Take 1 tablet (250 mg total) by mouth daily., Disp: 30 tablet, Rfl: 0   traZODone  (DESYREL ) 50 MG tablet, Take 1 tablet (50 mg total) by mouth at bedtime as needed. for sleep ( NEEDS AN APPOINTMENT FOR FUTURE REFILLS), Disp: 90 tablet, Rfl: 0   vitamin B-12 (CYANOCOBALAMIN) 1000 MCG tablet, Take 1,000 mcg by mouth daily., Disp: , Rfl:   Facility-Administered Medications Ordered in Other Encounters:    0.9 % NaCl with KCl 20 mEq/ L  infusion, , Intravenous, Once, Corcoran, Melissa C, MD   heparin  lock flush 100 UNIT/ML injection, , , ,    heparin  lock flush 100 unit/mL, 500 Units, Intravenous, Once, Babara Call, MD   magnesium  sulfate IVPB 2 g 50 mL, 2 g, Intravenous, Once, Corcoran, Melissa C, MD   sodium chloride  flush (NS) 0.9 % injection 10 mL, 10 mL, Intravenous, Once, Babara Call, MD  Vitals   BP (!) 172/82   Pulse 69   Temp 98.2 F (36.8 C) (Oral)   Resp 14   Ht 5' 11 (1.803 m)   Wt 88 kg   SpO2 98%   BMI 27.06 kg/m    Physical Exam   Constitutional: Appears well-developed and well-nourished.  Psych: Affect appropriate to situation.  Eyes: No scleral injection.  HENT: No OP obstruction.  Head: Normocephalic.  Respiratory: Effort normal, non-labored breathing.    Neurologic Examination   See NIHSS  Labs/Imaging/Neurodiagnostic studies   CBC: No results for input(s): WBC, NEUTROABS, HGB, HCT, MCV, PLT in the last 168 hours. Basic Metabolic Panel:  Lab Results  Component Value Date   NA 136 03/30/2024   K 3.2 (L) 03/30/2024   CO2 26 03/30/2024   GLUCOSE 295 (H) 03/30/2024  BUN 16  03/30/2024   CREATININE 0.89 03/30/2024   CALCIUM 9.0 03/30/2024   GFRNONAA >60 03/30/2024   GFRAA >60 09/29/2013   Lipid Panel: No results found for: LDLCALC HgbA1c:  Lab Results  Component Value Date   HGBA1C 8.1 (H) 01/20/2021   Urine Drug Screen:     Component Value Date/Time   LABOPIA NONE DETECTED 11/27/2020 1036   COCAINSCRNUR NONE DETECTED 11/27/2020 1036   LABBENZ POSITIVE (A) 11/27/2020 1036   AMPHETMU NONE DETECTED 11/27/2020 1036   THCU NONE DETECTED 11/27/2020 1036   LABBARB NONE DETECTED 11/27/2020 1036    Alcohol Level No results found for: [facility] INR  Lab Results  Component Value Date   INR 1.1 01/19/2021   APTT  Lab Results  Component Value Date   APTT 26 01/19/2021     ASSESSMENT  78 y.o. male with a PMHx of actinic keratosis, anxiety, cancer of head and throat, cataracts, PTSD with severe claustrophobia, DM, GERD, glaucoma, hypercholesterolemia, PE on Eliquis  and HTN who presents to the ED as a Code Stroke for new onset of aphasia and apraxia.The patient was having aphasia and could not remember how to check his blood sugar. He became worried and asked his spouse to call an ambulance. Of note, he also had difficulty driving his car today and yesterday. He was hypertensive on arrival.    - Neurological exam is normal. On evidence for aphasia or cognitive impairment. NIHSS 0.  - CT head: No acute intracranial abnormality. Mild chronic microvascular ischemic changes. ASPECTS is 10. - CTA of head and neck: No large vessel occlusion. 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 Moderate long segment stenosis of the M1 segment of the left MCA and additional moderate stenosis of a proximal M2 branch of the left MCA. Moderate-to-severe multifocal stenosis of the P1 and P2 segments of the right PCA. Moderate stenosis of the left P1 segment. Focal moderate stenosis of the distal V4 segment of the left vertebral artery and moderate stenosis of the  distal V4 segment of the right vertebral artery. 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 Alternatively, there is a relatively high likelihood that his presentation is due to a mild encephalopathy in the setting of malignant HTN.  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   RECOMMENDATIONS  - Cannot obtain MRI due to severe claustrophobia and PTSD from prior wartime experiences.  - Continue simvastatin  - Continue Eliquis . If this is discontinued in the future, then would start DAPT with ASA/Plavix given his intracranial atherosclerotic disease.  - Given that malignant HTN with confusion is felt to be of approximately equal likelihood as TIA for his presentation, would recommend holding off on permissive HTN protocol and treating his BP conservatively - IVF - TTE - Cardiac telemetry - HgbA1c, fasting lipid panel - PT consult, OT consult, Speech consult - Risk factor modification - Frequent neuro checks - NPO until passes stroke swallow screen - Stroke Team to follow in the morning   ______________________________________________________________________   Bonney SHARK, Sean Osgood, MD [facility]

## 2024-08-19 NOTE — Discharge Instructions (Signed)
 You were seen in the Emergency Department for confusion and high blood pressure Your blood pressure improved after several medications There was no evidence of stroke but we did see some narrowing of multiple blood vessels in the brain You will need to follow-up with your primary care doctor for this reason The rest of your blood work looked okay You were feeling much better after we lowered your blood pressure Return to the emergency room for severely high blood pressure readings, severe headaches, chest pain, confusion or any other concerns Otherwise follow-up with your PCP within 1 week for reevaluation

## 2024-08-24 ENCOUNTER — Other Ambulatory Visit: Payer: Self-pay | Admitting: Psychiatry

## 2024-08-24 ENCOUNTER — Telehealth: Payer: Self-pay | Admitting: Gastroenterology

## 2024-08-24 DIAGNOSIS — F411 Generalized anxiety disorder: Secondary | ICD-10-CM

## 2024-08-24 DIAGNOSIS — F4312 Post-traumatic stress disorder, chronic: Secondary | ICD-10-CM

## 2024-08-24 NOTE — Telephone Encounter (Signed)
 Inbound call from patient stating he has been having complications with his blood pressure the past week. States his primary care provider advised that 10/23 procedure should be postponed a month out. Requesting a call to discuss further. Please advise, thank you

## 2024-08-26 NOTE — Telephone Encounter (Signed)
 EUS has been set up for 10/04/24 at 1030 am at [facility] with GM  New instructions have been mailed and sent to My Chart

## 2024-08-26 NOTE — Telephone Encounter (Signed)
 EUS scheduled, pt instructed and medications reviewed.  Patient instructions mailed to home.  Patient to call with any questions or concerns.

## 2024-08-26 NOTE — Telephone Encounter (Signed)
 Dr Wilhelmenia please review and advise if ok to push out a month

## 2024-08-26 NOTE — Telephone Encounter (Signed)
 I think that is reasonable.   Safety first with patient's Cardiovascular needs. Hold that slot for now as I'm sure I'll have a need for an EUS in the next week. Thanks. GM

## 2024-09-01 ENCOUNTER — Ambulatory Visit: Admitting: Psychiatry

## 2024-09-01 ENCOUNTER — Telehealth: Payer: Self-pay | Admitting: Psychiatry

## 2024-09-01 NOTE — Telephone Encounter (Signed)
 Yes , please schedule him for medication management.

## 2024-09-01 NOTE — Telephone Encounter (Signed)
 Inbound call from patient stating [facility] is needing a form faxed over to give clearance for patient to have procedure. Please advise, thank you

## 2024-09-01 NOTE — Telephone Encounter (Signed)
 Form faxed to [facility] requesting clearance   Pt aware

## 2024-09-02 ENCOUNTER — Telehealth: Payer: Self-pay

## 2024-09-02 NOTE — Telephone Encounter (Signed)
 Medication problem - Message left for patient that his message was received with question if Dr. Coby would agree with him decreasing the recently started Sertraline  25 mg to a 1/2 tablet daily until he sees her on 09/23/24 as pt reports having some nightmares with the medication and not sleeping as well.

## 2024-09-02 NOTE — Telephone Encounter (Signed)
 Yes I agree with reducing the sertraline  dose until he sees me if he is having side effects.

## 2024-09-02 NOTE — Telephone Encounter (Signed)
 Yes , he missed his appointment and needs to be seen for medication management.

## 2024-09-03 NOTE — Telephone Encounter (Signed)
 Noted

## 2024-09-03 NOTE — Telephone Encounter (Signed)
 Called patient he was ok with reducing the dose of the Sertraline  and stated that he would see you on 09-07-24 at 3 pm

## 2024-09-07 ENCOUNTER — Encounter: Payer: Self-pay | Admitting: Psychiatry

## 2024-09-07 ENCOUNTER — Other Ambulatory Visit: Payer: Self-pay

## 2024-09-07 ENCOUNTER — Ambulatory Visit: Admitting: Psychiatry

## 2024-09-07 VITALS — BP 171/85 | HR 80 | Temp 97.6°F | Ht 71.0 in | Wt 198.4 lb

## 2024-09-07 DIAGNOSIS — F5101 Primary insomnia: Secondary | ICD-10-CM

## 2024-09-07 DIAGNOSIS — F4312 Post-traumatic stress disorder, chronic: Secondary | ICD-10-CM | POA: Diagnosis not present

## 2024-09-07 DIAGNOSIS — F411 Generalized anxiety disorder: Secondary | ICD-10-CM | POA: Diagnosis not present

## 2024-09-07 MED ORDER — TRAZODONE HCL 50 MG PO TABS: 50.0000 mg | ORAL_TABLET | Freq: Every evening | ORAL | 3 refills | Status: AC | PRN

## 2024-09-07 MED ORDER — CITALOPRAM HYDROBROMIDE 10 MG PO TABS: 10.0000 mg | ORAL_TABLET | Freq: Every day | ORAL | 1 refills | Status: DC

## 2024-09-07 NOTE — Progress Notes (Unsigned)
 [facility] MD OP Progress Note  09/07/2024 5:01 PM Sean Garner Claudene Garner.  MRN:  [id number]  Chief Complaint:  Chief Complaint  Patient presents with   Follow-up   Depression   Anxiety   Medication Refill   Discussed the use of AI scribe software for clinical note transcription with the patient, who gave verbal consent to proceed.  History of Present Illness Sean Garner. 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  He identifies his primary concern as anxiety related to persistently elevated blood pressure over the past 5 weeks. He reports ongoing worry about his health, which he believes contributes to his anxiety. He denies other ongoing anxiety symptoms and believes his anxiety screening score is likely elevated due to concerns about his blood pressure.  Irritability and frustration, which he notes have occurred intermittently regardless of medication, continue to affect him. Uncertainty about his health, particularly regarding blood pressure, sometimes leads him to feel a little depressed. He remains active, walking 1 to 1.5 miles nearly every day and engaging in daily activities. He does not notice significant changes in interest, energy, or concentration.  He recently changed his antidepressant regimen when he stopped citalopram  10 mg (Celexa ) approximately 1 month ago due to concerns about its effectiveness and switched to sertraline  (Zoloft ). For the past week, he has taken sertraline  25 mg, cut in half to 12.5 mg daily. He reports that sertraline  caused frequent, distressing military-related bad dreams, which resolved after he reduced the dose. Since he lowered the sertraline  dose, he has not experienced significant  dreams or sleep disturbances. He expresses a preference for citalopram , stating that he did not have major problems with it previously, aside from occasional irritability. He also takes trazodone  nightly for sleep.  He denies any suicidality, homicidality or perceptual disturbances.  He denies any thoughts about hurting himself or others when asked directly.  He denies alcohol use. He reports drinking coffee in the morning, typically a couple of cups per day.   Visit Diagnosis:    ICD-10-CM   1. GAD (generalized anxiety disorder)  F41.1 citalopram  (CELEXA ) 10 MG tablet    2. Chronic post-traumatic stress disorder (PTSD)  F43.12 citalopram  (CELEXA ) 10 MG tablet    3. Primary insomnia  F51.01 citalopram  (CELEXA ) 10 MG tablet    traZODone  (DESYREL ) 50 MG tablet      Past Psychiatric History: I have reviewed past psychiatric history from progress note on 01/01/2021.  Past trials of medications like Celexa , trazodone , mirtazapine -nightmares, sertraline -nightmares.  Past Medical History:  Past Medical History:  Diagnosis Date   Actinic keratosis    Anxiety    Cancer (HCC)    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   Hypercholesterolemia    Hypertension     Past Surgical History:  Procedure Laterality Date   BIOPSY  01/05/2024   Procedure: BIOPSY;  Surgeon: Wilhelmenia Aloha Garner., MD;  Location: [facility];  Service: Gastroenterology;;   CATARACT EXTRACTION W/PHACO Left  12/03/2017   Procedure: CATARACT EXTRACTION PHACO AND INTRAOCULAR LENS PLACEMENT (IOC) COMPLICATED DIABETIC LEFT;  Surgeon: Mittie Gaskin, MD;  Location: [facility];  Service: Ophthalmology;  Laterality: Left;  Diabetic - oral meds    CHOLECYSTECTOMY     COLONOSCOPY  2016   ESOPHAGOGASTRODUODENOSCOPY (EGD) WITH PROPOFOL  N/A 04/08/2022   Procedure: ESOPHAGOGASTRODUODENOSCOPY (EGD) WITH PROPOFOL ;  Surgeon: Wilhelmenia Aloha Garner., MD;  Location: [facility];  Service: Gastroenterology;  Laterality: N/A;   ESOPHAGOGASTRODUODENOSCOPY (EGD) WITH PROPOFOL  N/A 09/12/2022   Procedure: ESOPHAGOGASTRODUODENOSCOPY (EGD) WITH PROPOFOL ;  Surgeon: Wilhelmenia Aloha Garner., MD;  Location: [facility];  Service: Gastroenterology;  Laterality: N/A;   ESOPHAGOGASTRODUODENOSCOPY (EGD) WITH PROPOFOL  N/A 04/01/2023   Procedure: ESOPHAGOGASTRODUODENOSCOPY (EGD) WITH PROPOFOL ;  Surgeon: Wilhelmenia Aloha Garner., MD;  Location: [facility];  Service: Gastroenterology;  Laterality: N/A;   ESOPHAGOGASTRODUODENOSCOPY (EGD) WITH PROPOFOL  N/A 01/05/2024   Procedure: ESOPHAGOGASTRODUODENOSCOPY (EGD) WITH PROPOFOL ;  Surgeon: Wilhelmenia Aloha Garner., MD;  Location: [facility];  Service: Gastroenterology;  Laterality: N/A;   ESOPHAGOGASTRODUODENOSCOPY (EGD) WITH PROPOFOL  N/A 03/15/2024   Procedure: ESOPHAGOGASTRODUODENOSCOPY (EGD) WITH PROPOFOL ;  Surgeon: Wilhelmenia Aloha Garner., MD;  Location: [facility];  Service: Gastroenterology;  Laterality: N/A;   GI RADIOFREQUENCY ABLATION  04/08/2022   Procedure: GI RADIOFREQUENCY ABLATION;  Surgeon: Wilhelmenia Aloha Garner., MD;  Location: [facility];  Service: Gastroenterology;;   GI RADIOFREQUENCY ABLATION N/A 09/12/2022   Procedure: GI RADIOFREQUENCY ABLATION;  Surgeon: Wilhelmenia Aloha Garner., MD;  Location: [facility];  Service: Gastroenterology;  Laterality: N/A;   GI RADIOFREQUENCY ABLATION  04/01/2023   Procedure: GI RADIOFREQUENCY ABLATION;  Surgeon: Wilhelmenia Aloha Garner., MD;  Location: [facility];  Service: Gastroenterology;;   GI RADIOFREQUENCY ABLATION N/A 03/15/2024   Procedure: RADIOFREQUENCY ABLATION, UPPER GI TRACT, ENDOSCOPIC;  Surgeon: Wilhelmenia Aloha Garner., MD;  Location: [facility];   Service: Gastroenterology;  Laterality: N/A;   PORTA CATH INSERTION N/A 11/13/2020   Procedure: PORTA CATH INSERTION;  Surgeon: Marea Selinda RAMAN, MD;  Location: [facility];  Service: Cardiovascular;  Laterality: N/A;    Family Psychiatric History: I have reviewed family psychiatric history from progress note on 01/01/2021.  Family History:  Family History  Problem Relation Age of Onset   Cancer Mother    Colon cancer Neg Hx    Colon polyps Neg Hx    Esophageal cancer Neg Hx    Rectal cancer Neg Hx    Stomach cancer Neg Hx    Inflammatory bowel disease Neg Hx    Liver disease Neg Hx    Pancreatic cancer Neg Hx     Social History: I have reviewed social history from progress note on 01/01/2021. Social History   Socioeconomic History   Marital status: Married    Spouse name: Not on file   Number of children: Not on file   Years of education: Not on file   Highest education level: Not on file  Occupational History   Not on file  Tobacco Use   Smoking status: Former    Current packs/day: 0.00    Types: Cigarettes    Quit date: 10    Years since quitting: 39.8   Smokeless tobacco: Never  Vaping Use   Vaping status: Never Used  Substance and Sexual Activity   Alcohol use: Not Currently    Comment: occasional - 1 glass wine/month   Drug use: Never   Sexual activity: Not on file    Comment: not asked if sexually active  Other Topics Concern   Not on file  Social History Narrative   Not on file   Social Drivers of Health   Financial Resource Strain: Patient Declined (11/07/2023)   Received from [facility]   Overall Financial Resource Strain (CARDIA)    Difficulty of Paying Living Expenses: Patient declined  Food Insecurity: Patient Declined (11/07/2023)   Received from [facility]   Hunger Vital Sign    Within the past 12 months, you worried that your food would run out before you got the money to buy more.: Patient  declined    Within the past 12 months, the food you bought just didn't last and you didn't have money to get more.: Patient declined  Transportation Needs: Patient Declined (11/07/2023)   Received from [facility]    In the past 12 months, has lack of transportation kept you from medical appointments or from getting medications?: Patient declined    Lack of Transportation (Non-Medical): Patient declined  Physical Activity: Not on file  Stress: Not on file  Social Connections: Not on file    Allergies: No Known Allergies  Metabolic Disorder Labs: Lab Results  Component Value Date   HGBA1C 8.1 (H) 01/20/2021   MPG 185.77 01/20/2021   No results found for: PROLACTIN No results found for: CHOL, TRIG, HDL, CHOLHDL, VLDL, LDLCALC Lab Results  Component Value Date   TSH 0.577 01/08/2021   TSH 2.077 11/27/2020    Therapeutic Level Labs: No results found for: LITHIUM No results found for: VALPROATE No results found for: CBMZ  Current Medications: Current Outpatient Medications  Medication Sig Dispense Refill   telmisartan (MICARDIS) 40 MG tablet Take 40 mg by mouth daily.     acetaminophen -codeine  120-12 MG/5ML solution Take 10 mLs by mouth every 6 (six) hours as needed for moderate pain (pain score 4-6). 120 mL 0   apixaban  (ELIQUIS ) 2.5 MG TABS tablet Take 1 tablet (2.5 mg total) by mouth 2 (two) times daily. 180 tablet 1   betamethasone  valerate lotion (VALISONE ) 0.1 % Apply 1 Application topically as directed. qd up to 5 days a week to aa scalp, avoid face, groin, axilla 60 mL 6   brimonidine -timolol  (COMBIGAN ) 0.2-0.5 % ophthalmic solution Place 1 drop into the left eye 2 (two) times daily.     Calcium Carb-Cholecalciferol (CALCIUM 600 + D PO) Take 1 tablet by mouth daily.     cholecalciferol (VITAMIN D3) 25 MCG (1000 UNIT) tablet Take 1,000 Units by mouth daily.     citalopram  (CELEXA ) 10 MG tablet Take 1 tablet  (10 mg total) by mouth daily. Stop Sertraline  30 tablet 1   GI Cocktail (alum & mag hydroxide, lidocaine , dicyclomine ) oral mixture Take 30 mLs by mouth in the morning, at noon, in the evening, and at bedtime. Medication Name: GI cocktail  90 ml viscous lidocaine  90 ml 10 mg/5 ml dicyclomine  270 ml maalox  Swish and swallow 550 mL 3   glucose blood test strip      lidocaine -prilocaine  (EMLA ) cream Apply small amount to port and cover with saran wrap 1-2 hours prior to port access 30 g 3   loratadine (CLARITIN) 10 MG tablet Take 10 mg by mouth daily with lunch.     Magnesium  400 MG TABS Take 400 mg by mouth daily.     metFORMIN (GLUCOPHAGE-XR) 500 MG 24 hr tablet Take 500 mg by mouth 2 (two) times daily.     Multiple Vitamin (MULTIVITAMIN) tablet Take 1 tablet by mouth  daily.     pantoprazole  (PROTONIX ) 40 MG tablet TAKE 1 TABLET BY MOUTH TWICE A DAY BEFORE MEALS 180 tablet 2   Phenylephrine HCl (4-WAY FAST ACTING NA) Place 1 spray into the nose daily as needed (congestion).     potassium chloride  SA (KLOR-CON  M) 20 MEQ tablet Take 1 tablet (20 mEq total) by mouth daily. 3 tablet 0   simvastatin  (ZOCOR ) 40 MG tablet Take 40 mg by mouth daily at 6 PM.     sucralfate  (CARAFATE ) 1 g tablet Take 1 tablet (1 g total) by mouth 4 (four) times daily. Crush and dissolve in 15 mL of fluid 120 tablet 2   terbinafine  (LAMISIL ) 250 MG tablet Take 1 tablet (250 mg total) by mouth daily. 30 tablet 0   traZODone  (DESYREL ) 50 MG tablet Take 1 tablet (50 mg total) by mouth at bedtime as needed. for sleep 90 tablet 3   vitamin B-12 (CYANOCOBALAMIN) 1000 MCG tablet Take 1,000 mcg by mouth daily.     Current Facility-Administered Medications  Medication Dose Route Frequency Provider Last Rate Last Admin   0.9 %  sodium chloride  infusion  500 mL Intravenous Once Pyrtle, Gordy HERO, MD       Facility-Administered Medications Ordered in Other Visits  Medication Dose Route Frequency Provider Last Rate Last Admin   0.9 %  NaCl with KCl 20 mEq/ L  infusion   Intravenous Once Corcoran, Melissa C, MD       heparin  lock flush 100 UNIT/ML injection            heparin  lock flush 100 unit/mL  500 Units Intravenous Once Yu, Zhou, MD       magnesium  sulfate IVPB 2 g 50 mL  2 g Intravenous Once Corcoran, Melissa C, MD       sodium chloride  flush (NS) 0.9 % injection 10 mL  10 mL Intravenous Once Babara Call, MD         Musculoskeletal: Strength & Muscle Tone: within normal limits Gait & Station: normal Patient leans: N/A  Psychiatric Specialty Exam: Review of Systems  Psychiatric/Behavioral:  Positive for dysphoric mood and sleep disturbance. The patient is nervous/anxious.     Blood pressure (!) 171/85, pulse 80, temperature 97.6 F (36.4 C), temperature source Temporal, height 5' 11 (1.803 m), weight 198 lb 6.4 oz (90 kg).Body mass index is 27.67 kg/m.  General Appearance: Fairly Groomed  Eye Contact:  Fair  Speech:  Clear and Coherent  Volume:  Normal  Mood:  Anxious and Depressed  Affect:  Appropriate  Thought Process:  Goal Directed and Descriptions of Associations: Intact  Orientation:  Full (Time, Place, and Person)  Thought Content: Logical   Suicidal Thoughts:  No  Homicidal Thoughts:  No  Memory:  Immediate;   Fair Recent;   Fair Remote;   Fair  Judgement:  Fair  Insight:  Fair  Psychomotor Activity:  Normal  Concentration:  Concentration: Fair and Attention Span: Fair  Recall:  [employer] of Knowledge: Fair  Language: Fair  Akathisia:  No  Handed:  Right  AIMS (if indicated): not done  Assets:  Communication Skills Desire for Improvement Housing Social Support Transportation  ADL's:  Intact  Cognition: WNL  Sleep:  improving   Screenings: AIMS    Flowsheet Row Office Visit from 07/02/2022 in [facility]  AIMS Total Score 0   GAD-7    Flowsheet Row Office Visit from 09/07/2024 in [facility]  Office Visit from 12/23/2023 in [facility] Office Visit from 07/02/2022 in [facility] Office Visit from 01/30/2022 in [facility] Video Visit from 01/01/2021 in [facility]  Total GAD-7 Score 11 0 7 2 12    PHQ2-9    Flowsheet Row Office Visit from 09/07/2024 in [facility] Office Visit from 07/29/2024 in [facility] Office Visit from 12/23/2023 in [facility] Office Visit from 07/02/2022 in [facility] Office Visit from 01/30/2022 in [facility]  PHQ-2 Total Score 2 2 0 1 0  PHQ-9 Total Score 7 7 -- -- --   Flowsheet Row Office Visit from 07/29/2024 in [facility] Admission (Discharged) from 03/15/2024 in [facility] Admission (Discharged) from 01/05/2024 in [facility]  C-SSRS RISK CATEGORY No Risk No Risk No Risk     Assessment and Plan: MARQUICE UDDIN Jr.is a 78 year old Caucasian male who presented for a follow-up appointment, discussed assessment and plan as noted below.  1. GAD (generalized anxiety disorder)-unstable Currently with worsening anxiety and side effects to sertraline .  Side effects of nightmares improved when sertraline  was lowered to 12.5 mg and has been on that dose since the past 7 days.  Interested in retrial of citalopram  which she was previously on. Discontinue Sertraline  for side effects. Restart Citalopram  10 mg daily  2. Chronic post-traumatic stress disorder (PTSD)-stable Currently denies any significant intrusive memories.  Reports sleep has gotten better since reducing the sertraline  dosage. Continue Trazodone  50 mg  at bedtime as needed for sleep  3. Primary insomnia-improving Sleep has been better since nightmare improved since reducing the dosage of sertraline  recently.  Trazodone  has been beneficial. Continue Trazodone  50 mg at bedtime as needed  Follow-up Follow-up in clinic in 4 weeks or sooner if needed.    Collaboration of Care: Collaboration of Care: Primary Care Provider AEB patient with elevated blood pressure reading, blood pressure was repeated in session, patient currently under the care of primary care provider who has been adjusting his medications.  Patient has upcoming visit.  Patient/Guardian was advised Release of Information must be obtained prior to any record release in order to collaborate their care with an outside provider. Patient/Guardian was advised if they have not already done so to contact the registration department to sign all necessary forms in order for us  to release information regarding their care.   Consent: Patient/Guardian gives verbal consent for treatment and assignment of benefits for services provided during this visit. Patient/Guardian expressed understanding and agreed to proceed.   This note was generated in part or whole with voice recognition software. Voice recognition is usually quite accurate but there are transcription errors that can and very often do occur. I apologize for any typographical errors that were not detected and corrected.    Cedar Ditullio, MD 09/07/2024, 5:01 PM

## 2024-09-08 ENCOUNTER — Telehealth: Payer: Self-pay | Admitting: Psychiatry

## 2024-09-08 NOTE — Telephone Encounter (Signed)
 I just started him back on citalopram .  I will recommend waiting a few weeks before considering addition of another medication like BuSpar.  Please let them know.

## 2024-09-09 NOTE — Telephone Encounter (Signed)
 Noted

## 2024-09-14 ENCOUNTER — Inpatient Hospital Stay: Attending: Oncology

## 2024-09-14 DIAGNOSIS — Z452 Encounter for adjustment and management of vascular access device: Secondary | ICD-10-CM | POA: Insufficient documentation

## 2024-09-14 DIAGNOSIS — C159 Malignant neoplasm of esophagus, unspecified: Secondary | ICD-10-CM | POA: Insufficient documentation

## 2024-09-14 DIAGNOSIS — Z87891 Personal history of nicotine dependence: Secondary | ICD-10-CM | POA: Insufficient documentation

## 2024-09-14 DIAGNOSIS — C01 Malignant neoplasm of base of tongue: Secondary | ICD-10-CM | POA: Insufficient documentation

## 2024-09-23 ENCOUNTER — Ambulatory Visit: Admitting: Psychiatry

## 2024-09-27 ENCOUNTER — Encounter (HOSPITAL_COMMUNITY): Payer: Self-pay | Admitting: Gastroenterology

## 2024-09-27 NOTE — Progress Notes (Signed)
 Attempted to obtain medical history for pre op call via telephone, unable to reach at this time. HIPAA compliant voicemail message left requesting return call to pre surgical testing department.

## 2024-09-30 ENCOUNTER — Ambulatory Visit
Admission: RE | Admit: 2024-09-30 | Discharge: 2024-09-30 | Disposition: A | Discharge status: Home / Self Care | Source: Ambulatory Visit | Attending: Oncology | Admitting: Oncology

## 2024-09-30 ENCOUNTER — Telehealth: Payer: Self-pay

## 2024-09-30 ENCOUNTER — Other Ambulatory Visit: Payer: Self-pay | Admitting: Psychiatry

## 2024-09-30 ENCOUNTER — Ambulatory Visit

## 2024-09-30 DIAGNOSIS — F4312 Post-traumatic stress disorder, chronic: Secondary | ICD-10-CM

## 2024-09-30 DIAGNOSIS — C01 Malignant neoplasm of base of tongue: Secondary | ICD-10-CM | POA: Diagnosis present

## 2024-09-30 DIAGNOSIS — F5101 Primary insomnia: Secondary | ICD-10-CM

## 2024-09-30 DIAGNOSIS — C159 Malignant neoplasm of esophagus, unspecified: Secondary | ICD-10-CM | POA: Diagnosis present

## 2024-09-30 DIAGNOSIS — F411 Generalized anxiety disorder: Secondary | ICD-10-CM

## 2024-09-30 MED ORDER — SODIUM CHLORIDE 0.9 % IV SOLN: INTRAVENOUS | Status: DC

## 2024-09-30 MED ORDER — IOHEXOL 300 MG/ML  SOLN
100.0000 mL | Freq: Once | INTRAMUSCULAR | Status: AC | PRN
  Administered 2024-09-30: 100 mL via INTRAVENOUS

## 2024-09-30 NOTE — Telephone Encounter (Signed)
 You all for letting me know.  Certainly if he is not feeling like his blood pressure is up to snuff, then we should cancel and reschedule. Please schedule accordingly with what we have available in the rest of December or January. Thank you. GM

## 2024-09-30 NOTE — Telephone Encounter (Signed)
 Procedure:EGD Procedure date: 10/04/24 Procedure location: [facility] Arrival Time: 9:00 Spoke with the patient Y/N: Y Any prep concerns? N  Has the patient obtained the prep from the pharmacy ? N Do you have a care partner and transportation: N Any additional concerns? Y  I called the patient and he stated to me that he needed to cancel his procedure and reschedule because he was having a lot of problems with his blood pressure being high, patient stated he had a Dr. Appointment to discuss his concerns with his PCP.

## 2024-10-01 NOTE — Telephone Encounter (Signed)
 Procedure has been rescheduled to 11/25/24 at 8 am.  New instructions sent to My Chart and mailed as well as discussed over the phone

## 2024-10-04 ENCOUNTER — Ambulatory Visit: Admitting: Cardiovascular Disease

## 2024-10-07 ENCOUNTER — Encounter: Payer: Self-pay | Admitting: Psychiatry

## 2024-10-07 ENCOUNTER — Other Ambulatory Visit: Payer: Self-pay

## 2024-10-07 ENCOUNTER — Ambulatory Visit: Admitting: Psychiatry

## 2024-10-07 VITALS — BP 154/72 | HR 51 | Temp 97.6°F | Ht 71.0 in | Wt 197.2 lb

## 2024-10-07 DIAGNOSIS — F411 Generalized anxiety disorder: Secondary | ICD-10-CM

## 2024-10-07 DIAGNOSIS — F5101 Primary insomnia: Secondary | ICD-10-CM | POA: Diagnosis not present

## 2024-10-07 DIAGNOSIS — Z79899 Other long term (current) drug therapy: Secondary | ICD-10-CM

## 2024-10-07 DIAGNOSIS — F4312 Post-traumatic stress disorder, chronic: Secondary | ICD-10-CM

## 2024-10-07 NOTE — Progress Notes (Signed)
 [facility] MD OP Progress Note  10/07/2024 10:26 AM Sean Garner.  MRN:  [id number]  Chief Complaint:  Chief Complaint  Patient presents with   Follow-up   Medication Refill   Anxiety   Post-Traumatic Stress Disorder   Discussed the use of AI scribe software for clinical note transcription with the patient, who gave verbal consent to proceed.  History of Present Illness Sean Garner. 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  He reports feeling well overall and does not experience any current symptoms that bother him. He describes enjoying preparations for Christmas, putting up decorations, and planning to travel to the beach for Thanksgiving, expressing enjoyment in these activities.  He describes his sleep as adequate, typically going to bed around 8:30 or 9:00 PM and waking up early, estimating a total of 8 hours of sleep per night. He does not report significant anxiety and states that he has not experienced PTSD symptoms recently, though he mentions occasional dreams related to his time in service that occur less frequently and cause less distress than in the past.  He denies experiencing bad dreams currently and reports that stopping sertraline  resolved this issue.  He denies any homicidality, suicidality or perceptual disturbances.  Currently, he takes citalopram  10 mg, with the last dose taken this morning.  He continues to be compliant on the trazodone  as needed.  He walks 1 to 1.5 miles 3 to 4 times per week. He lives with his spouse. He plans to spend a week at a family house on [address] near [place].  He denies any other concerns today.    Visit Diagnosis:    ICD-10-CM   1. GAD  (generalized anxiety disorder)  F41.1     2. Chronic post-traumatic stress disorder (PTSD)  F43.12     3. Primary insomnia  F51.01     4. High risk medication use  Z79.899 Sodium    Platelet count      Past Psychiatric History: I have reviewed past psychiatric history from progress note on 01/01/2021.  Past trials of medications like Celexa , trazodone , mirtazapine -nightmares, sertraline -nightmares  Past Medical History:  Past Medical History:  Diagnosis Date   Actinic keratosis    Anxiety    Cancer (HCC)    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   Hypercholesterolemia    Hypertension     Past Surgical History:  Procedure Laterality Date   BIOPSY  01/05/2024   Procedure: BIOPSY;  Surgeon: Sean Garner., MD;  Location: [facility];  Service: Gastroenterology;;   CATARACT EXTRACTION W/PHACO Left 12/03/2017   Procedure: CATARACT EXTRACTION PHACO AND INTRAOCULAR LENS PLACEMENT (IOC) COMPLICATED DIABETIC LEFT;  Surgeon: Sean Gaskin, MD;  Location: [facility];  Service: Ophthalmology;  Laterality: Left;  Diabetic - oral meds   CHOLECYSTECTOMY     COLONOSCOPY  2016   ESOPHAGOGASTRODUODENOSCOPY (EGD) WITH PROPOFOL  N/A 04/08/2022   Procedure: ESOPHAGOGASTRODUODENOSCOPY (EGD) WITH PROPOFOL ;  Surgeon: Sean Garner.,  MD;  Location: [facility];  Service: Gastroenterology;  Laterality: N/A;   ESOPHAGOGASTRODUODENOSCOPY (EGD) WITH PROPOFOL  N/A 09/12/2022   Procedure: ESOPHAGOGASTRODUODENOSCOPY (EGD) WITH PROPOFOL ;  Surgeon: Sean Garner., MD;  Location: [facility];  Service: Gastroenterology;  Laterality: N/A;   ESOPHAGOGASTRODUODENOSCOPY (EGD) WITH PROPOFOL  N/A 04/01/2023   Procedure:  ESOPHAGOGASTRODUODENOSCOPY (EGD) WITH PROPOFOL ;  Surgeon: Sean Garner., MD;  Location: [facility];  Service: Gastroenterology;  Laterality: N/A;   ESOPHAGOGASTRODUODENOSCOPY (EGD) WITH PROPOFOL  N/A 01/05/2024   Procedure: ESOPHAGOGASTRODUODENOSCOPY (EGD) WITH PROPOFOL ;  Surgeon: Sean Garner., MD;  Location: [facility];  Service: Gastroenterology;  Laterality: N/A;   ESOPHAGOGASTRODUODENOSCOPY (EGD) WITH PROPOFOL  N/A 03/15/2024   Procedure: ESOPHAGOGASTRODUODENOSCOPY (EGD) WITH PROPOFOL ;  Surgeon: Sean Garner., MD;  Location: [facility];  Service: Gastroenterology;  Laterality: N/A;   GI RADIOFREQUENCY ABLATION  04/08/2022   Procedure: GI RADIOFREQUENCY ABLATION;  Surgeon: Sean Garner., MD;  Location: [facility];  Service: Gastroenterology;;   GI RADIOFREQUENCY ABLATION N/A 09/12/2022   Procedure: GI RADIOFREQUENCY ABLATION;  Surgeon: Sean Garner., MD;  Location: [facility];  Service: Gastroenterology;  Laterality: N/A;   GI RADIOFREQUENCY ABLATION  04/01/2023   Procedure: GI RADIOFREQUENCY ABLATION;  Surgeon: Sean Garner., MD;  Location: [facility];  Service: Gastroenterology;;   GI RADIOFREQUENCY ABLATION N/A 03/15/2024   Procedure: RADIOFREQUENCY ABLATION, UPPER GI TRACT, ENDOSCOPIC;  Surgeon: Sean Garner., MD;  Location: [facility];  Service: Gastroenterology;  Laterality: N/A;   PORTA CATH INSERTION N/A 11/13/2020   Procedure: PORTA CATH INSERTION;  Surgeon: Sean Selinda RAMAN, MD;  Location: [facility];  Service: Cardiovascular;  Laterality: N/A;    Family Psychiatric History: I have reviewed family psychiatric history from progress note on 01/01/2021.  Family History:  Family History  Problem Relation Age of Onset   Cancer Mother    Colon cancer Neg Hx    Colon polyps Neg Hx    Esophageal cancer Neg Hx    Rectal cancer Neg Hx    Stomach cancer Neg Hx    Inflammatory bowel disease Neg Hx     Liver disease Neg Hx    Pancreatic cancer Neg Hx     Social History: I have reviewed social history from progress note on 01/01/2021. Social History   Socioeconomic History   Marital status: Married    Spouse name: Not on file   Number of children: Not on file   Years of education: Not on file   Highest education level: Not on file  Occupational History   Not on file  Tobacco Use   Smoking status: Former    Current packs/day: 0.00    Types: Cigarettes    Quit date: 51    Years since quitting: 39.9   Smokeless tobacco: Never  Vaping Use   Vaping status: Never Used  Substance and Sexual Activity   Alcohol use: Not Currently    Comment: occasional - 1 glass wine/month   Drug use: Never   Sexual activity: Not on file    Comment: not asked if sexually active  Other Topics Concern   Not on file  Social History Narrative   Not on file   Social Drivers of Health   Financial Resource Strain: Patient Declined (11/07/2023)   Received from [facility]   Overall Financial Resource Strain (CARDIA)    Difficulty of Paying Living Expenses: Patient declined  Food Insecurity: Patient Declined (11/07/2023)   Received from [facility]   Hunger Vital  Sign    Within the past 12 months, you worried that your food would run out before you got the money to buy more.: Patient declined    Within the past 12 months, the food you bought just didn't last and you didn't have money to get more.: Patient declined  Transportation Needs: Patient Declined (11/07/2023)   Received from [facility] - Transportation    In the past 12 months, has lack of transportation kept you from medical appointments or from getting medications?: Patient declined    Lack of Transportation (Non-Medical): Patient declined  Physical Activity: Not on file  Stress: Not on file  Social Connections: Not on file    Allergies: No Known Allergies  Metabolic  Disorder Labs: Lab Results  Component Value Date   HGBA1C 8.1 (H) 01/20/2021   MPG 185.77 01/20/2021   No results found for: PROLACTIN No results found for: CHOL, TRIG, HDL, CHOLHDL, VLDL, LDLCALC Lab Results  Component Value Date   TSH 0.577 01/08/2021   TSH 2.077 11/27/2020    Therapeutic Level Labs: No results found for: LITHIUM No results found for: VALPROATE No results found for: CBMZ  Current Medications: Current Outpatient Medications  Medication Sig Dispense Refill   acetaminophen -codeine  120-12 MG/5ML solution Take 10 mLs by mouth every 6 (six) hours as needed for moderate pain (pain score 4-6). 120 mL 0   apixaban  (ELIQUIS ) 2.5 MG TABS tablet Take 1 tablet (2.5 mg total) by mouth 2 (two) times daily. 180 tablet 1   betamethasone  valerate lotion (VALISONE ) 0.1 % Apply 1 Application topically as directed. qd up to 5 days a week to aa scalp, avoid face, groin, axilla 60 mL 6   brimonidine -timolol  (COMBIGAN ) 0.2-0.5 % ophthalmic solution Place 1 drop into the left eye 2 (two) times daily.     Calcium Carb-Cholecalciferol (CALCIUM 600 + D PO) Take 1 tablet by mouth daily.     cholecalciferol (VITAMIN D3) 25 MCG (1000 UNIT) tablet Take 1,000 Units by mouth daily.     citalopram  (CELEXA ) 10 MG tablet TAKE 1 TABLET (10 MG TOTAL) BY MOUTH DAILY. STOP SERTRALINE  90 tablet 1   GI Cocktail (alum & mag hydroxide, lidocaine , dicyclomine ) oral mixture Take 30 mLs by mouth in the morning, at noon, in the evening, and at bedtime. Medication Name: GI cocktail  90 ml viscous lidocaine  90 ml 10 mg/5 ml dicyclomine  270 ml maalox  Swish and swallow 550 mL 3   glucose blood test strip      lidocaine -prilocaine  (EMLA ) cream Apply small amount to port and cover with saran wrap 1-2 hours prior to port access 30 g 3   loratadine (CLARITIN) 10 MG tablet Take 10 mg by mouth daily with lunch.     Magnesium  400 MG TABS Take 400 mg by mouth daily.     metFORMIN (GLUCOPHAGE-XR) 500  MG 24 hr tablet Take 500 mg by mouth 2 (two) times daily.     Multiple Vitamin (MULTIVITAMIN) tablet Take 1 tablet by mouth daily.     pantoprazole  (PROTONIX ) 40 MG tablet TAKE 1 TABLET BY MOUTH TWICE A DAY BEFORE MEALS 180 tablet 2   Phenylephrine HCl (4-WAY FAST ACTING NA) Place 1 spray into the nose daily as needed (congestion).     potassium chloride  SA (KLOR-CON  M) 20 MEQ tablet Take 1 tablet (20 mEq total) by mouth daily. 3 tablet 0   simvastatin  (ZOCOR ) 40 MG tablet Take 40 mg by mouth daily at 6  PM.     sucralfate  (CARAFATE ) 1 g tablet Take 1 tablet (1 g total) by mouth 4 (four) times daily. Crush and dissolve in 15 mL of fluid 120 tablet 2   telmisartan (MICARDIS) 40 MG tablet Take 40 mg by mouth daily.     terbinafine  (LAMISIL ) 250 MG tablet Take 1 tablet (250 mg total) by mouth daily. 30 tablet 0   traZODone  (DESYREL ) 50 MG tablet Take 1 tablet (50 mg total) by mouth at bedtime as needed. for sleep 90 tablet 3   vitamin B-12 (CYANOCOBALAMIN) 1000 MCG tablet Take 1,000 mcg by mouth daily.     Current Facility-Administered Medications  Medication Dose Route Frequency Provider Last Rate Last Admin   0.9 %  sodium chloride  infusion  500 mL Intravenous Once Pyrtle, Gordy HERO, MD       Facility-Administered Medications Ordered in Other Visits  Medication Dose Route Frequency Provider Last Rate Last Admin   0.9 % NaCl with KCl 20 mEq/ L  infusion   Intravenous Once Corcoran, Melissa C, MD       heparin  lock flush 100 UNIT/ML injection            heparin  lock flush 100 unit/mL  500 Units Intravenous Once Babara Call, MD       magnesium  sulfate IVPB 2 g 50 mL  2 g Intravenous Once Corcoran, Melissa C, MD       sodium chloride  flush (NS) 0.9 % injection 10 mL  10 mL Intravenous Once Babara Call, MD         Musculoskeletal: Strength & Muscle Tone: within normal limits Gait & Station: normal Patient leans: N/A  Psychiatric Specialty Exam: Review of Systems  Psychiatric/Behavioral: Negative.       Blood pressure (!) 154/72, pulse (!) 51, temperature 97.6 F (36.4 C), temperature source Temporal, height 5' 11 (1.803 m), weight 197 lb 3.2 oz (89.4 kg).Body mass index is 27.5 kg/m.  General Appearance: Fairly Groomed  Eye Contact:  Fair  Speech:  Clear and Coherent  Volume:  Normal  Mood:  Euthymic  Affect:  Congruent  Thought Process:  Goal Directed and Descriptions of Associations: Intact  Orientation:  Full (Time, Place, and Person)  Thought Content: Logical   Suicidal Thoughts:  No  Homicidal Thoughts:  No  Memory:  Immediate;   Fair Recent;   Fair Remote;   Fair  Judgement:  Fair  Insight:  Fair  Psychomotor Activity:  Normal  Concentration:  Concentration: Fair and Attention Span: Fair  Recall:  [employer] of Knowledge: Fair  Language: Fair  Akathisia:  No  Handed:  Right  AIMS (if indicated): not done  Assets:  Manufacturing Systems Engineer Desire for Improvement Housing Social Support Transportation  ADL's:  Intact  Cognition: WNL  Sleep:  Fair   Screenings: Geneticist, Molecular Office Visit from 07/02/2022 in [facility]  AIMS Total Score 0   GAD-7    Flowsheet Row Office Visit from 10/07/2024 in [facility] Office Visit from 09/07/2024 in [facility] Office Visit from 12/23/2023 in [facility] Office Visit from 07/02/2022 in [facility] Office Visit from 01/30/2022 in [facility]  Total GAD-7 Score 0 11 0 7 2   PHQ2-9    Flowsheet Row Office Visit from 10/07/2024 in [facility] Office Visit from  09/07/2024 in [facility] Office Visit from 07/29/2024 in [facility] Office Visit from 12/23/2023 in [facility] Office Visit from 07/02/2022 in [facility]  PHQ-2 Total Score 0 2 2 0 1  PHQ-9 Total Score -- 7 7 -- --   Flowsheet Row Office Visit from 10/07/2024 in [facility] Office Visit from 09/07/2024 in [facility] Office Visit from 07/29/2024 in [facility]  C-SSRS RISK CATEGORY No Risk No Risk No Risk     Assessment and Plan: Sean Garneris a 78 year old Caucasian male who presented for a follow-up appointment, discussed assessment and plan as noted below.  1. GAD (generalized anxiety disorder)-stable Currently reports anxiety is well-managed on the current medication regimen. Continue Citalopram  10 mg daily  2. Chronic post-traumatic stress disorder (PTSD)-stable Denies any trauma related symptoms and sleep is good on the trazodone . Continue Trazodone  50 mg at bedtime as needed for sleep Continue Citalopram  10 mg daily  3. Primary insomnia-stable Reports overall sleep is good gets around 8 hours. Continue Trazodone  50 mg at bedtime as needed   4. High risk medication use Will order sodium level, platelet count since patient is on an SSRI.  Patient to go to [facility] lab.  Follow-up Follow-up in clinic in 3 months or sooner if needed.    Collaboration of Care: Collaboration of Care: Primary Care Provider AEB  encouraged to follow-up with primary care provider for elevated blood pressure reading.  Patient/Guardian was advised Release of Information must be obtained prior to any record release in order to collaborate their care with an outside provider. Patient/Guardian was advised if they have not already done so to contact the registration department to sign all necessary forms in order for us  to release information regarding their care.   Consent: Patient/Guardian gives verbal  consent for treatment and assignment of benefits for services provided during this visit. Patient/Guardian expressed understanding and agreed to proceed.   This note was generated in part or whole with voice recognition software. Voice recognition is usually quite accurate but there are transcription errors that can and very often do occur. I apologize for any typographical errors that were not detected and corrected.    Sean Misiaszek, MD 10/08/2024, 10:18 AM

## 2024-10-08 ENCOUNTER — Encounter: Payer: Self-pay | Admitting: Oncology

## 2024-10-08 ENCOUNTER — Inpatient Hospital Stay: Attending: Oncology

## 2024-10-08 ENCOUNTER — Ambulatory Visit: Payer: Self-pay | Admitting: Oncology

## 2024-10-08 ENCOUNTER — Telehealth: Payer: Self-pay | Admitting: Oncology

## 2024-10-08 ENCOUNTER — Inpatient Hospital Stay (HOSPITAL_BASED_OUTPATIENT_CLINIC_OR_DEPARTMENT_OTHER): Admitting: Oncology

## 2024-10-08 VITALS — BP 158/78 | HR 66 | Temp 96.0°F | Resp 18 | Wt 197.9 lb

## 2024-10-08 DIAGNOSIS — I2699 Other pulmonary embolism without acute cor pulmonale: Secondary | ICD-10-CM | POA: Diagnosis not present

## 2024-10-08 DIAGNOSIS — Z87891 Personal history of nicotine dependence: Secondary | ICD-10-CM | POA: Diagnosis not present

## 2024-10-08 DIAGNOSIS — Z7189 Other specified counseling: Secondary | ICD-10-CM | POA: Diagnosis not present

## 2024-10-08 DIAGNOSIS — Z79899 Other long term (current) drug therapy: Secondary | ICD-10-CM | POA: Insufficient documentation

## 2024-10-08 DIAGNOSIS — C159 Malignant neoplasm of esophagus, unspecified: Secondary | ICD-10-CM

## 2024-10-08 DIAGNOSIS — Z95828 Presence of other vascular implants and grafts: Secondary | ICD-10-CM

## 2024-10-08 DIAGNOSIS — C01 Malignant neoplasm of base of tongue: Secondary | ICD-10-CM

## 2024-10-08 DIAGNOSIS — R6889 Other general symptoms and signs: Secondary | ICD-10-CM

## 2024-10-08 DIAGNOSIS — Z1329 Encounter for screening for other suspected endocrine disorder: Secondary | ICD-10-CM

## 2024-10-08 LAB — CBC WITH DIFFERENTIAL (CANCER CENTER ONLY)
Abs Immature Granulocytes: 0.02 K/uL (ref 0.00–0.07)
Basophils Absolute: 0 K/uL (ref 0.0–0.1)
Basophils Relative: 1 %
Eosinophils Absolute: 0.1 K/uL (ref 0.0–0.5)
Eosinophils Relative: 1 %
HCT: 40.1 % (ref 39.0–52.0)
Hemoglobin: 14.1 g/dL (ref 13.0–17.0)
Immature Granulocytes: 0 %
Lymphocytes Relative: 13 %
Lymphs Abs: 0.7 K/uL (ref 0.7–4.0)
MCH: 31 pg (ref 26.0–34.0)
MCHC: 35.2 g/dL (ref 30.0–36.0)
MCV: 88.1 fL (ref 80.0–100.0)
Monocytes Absolute: 0.3 K/uL (ref 0.1–1.0)
Monocytes Relative: 6 %
Neutro Abs: 4.5 K/uL (ref 1.7–7.7)
Neutrophils Relative %: 79 %
Platelet Count: 184 K/uL (ref 150–400)
RBC: 4.55 MIL/uL (ref 4.22–5.81)
RDW: 12.5 % (ref 11.5–15.5)
WBC Count: 5.7 K/uL (ref 4.0–10.5)
nRBC: 0 % (ref 0.0–0.2)

## 2024-10-08 LAB — CMP (CANCER CENTER ONLY)
ALT: 24 U/L (ref 0–44)
AST: 23 U/L (ref 15–41)
Albumin: 3.9 g/dL (ref 3.5–5.0)
Alkaline Phosphatase: 51 U/L (ref 38–126)
Anion gap: 7 (ref 5–15)
BUN: 17 mg/dL (ref 8–23)
CO2: 25 mmol/L (ref 22–32)
Calcium: 8.6 mg/dL — ABNORMAL LOW (ref 8.9–10.3)
Chloride: 98 mmol/L (ref 98–111)
Creatinine: 1.03 mg/dL (ref 0.61–1.24)
GFR, Estimated: >60 mL/min (ref >60)
Glucose, Bld: 286 mg/dL — ABNORMAL HIGH (ref 70–99)
Potassium: 4 mmol/L (ref 3.5–5.1)
Sodium: 130 mmol/L — ABNORMAL LOW (ref 135–145)
Total Bilirubin: 0.8 mg/dL (ref 0.0–1.2)
Total Protein: 6.8 g/dL (ref 6.5–8.1)

## 2024-10-08 LAB — TSH: TSH: 4.61 u[IU]/mL — ABNORMAL HIGH (ref 0.350–4.500)

## 2024-10-08 MED ORDER — APIXABAN 2.5 MG PO TABS: 2.5000 mg | ORAL_TABLET | Freq: Two times a day (BID) | ORAL | 1 refills | Status: AC

## 2024-10-08 NOTE — Progress Notes (Signed)
 Hematology/Oncology Progress note [phone] [phone]       Chief Complaint: Sean Garner. is a 78 y.o. 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   Assessment/Plan.   Cancer Staging  Carcinoma of base of tongue (HCC) Staging form: Pharynx - HPV-Mediated Oropharynx, AJCC 8th Edition - Clinical stage from 10/11/2020: Stage I (cT1, cN1, cM0, p16+) - Signed by Babara Call, MD on 04/28/2022   Carcinoma of base of tongue (HCC) Stage I right base of tongue carcinoma,+P16   01/11/2021. completed concurrent radiation and cisplatin   CT neck showed non specific thickening of epiglottis.he has no clinical signs of epiglottitis. -observation.  Recommend patient to follow-up  with ENT  Will monitor thyroid  function.  TSH is slightly elevated.  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  10/29/2021, EGD showed no invasive esophagus carcinoma.  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 02/2024 S/p radiofrequency ablation Continue follow up with GI and EGD surveillance and ablation if needed.  CT images were reviewed and discussed with patient. - no recurrence.  Repeat CT in 6 months.   Pulmonary embolism, bilateral (HCC) Bilateral pulmonary emboli and RLE DVT - 01/2021 Continue Eliquis  to 2.5 mg twice daily for prophylaxis.   Port-A-Cath in place Discussed about option of Mediport removal.  Patient would like to continue keep maintaining his port.  Continue port flush every 6- 8 weeks.     Orders Placed This Encounter  Procedures   CT SOFT TISSUE NECK WO CONTRAST    Standing Status:   Future    Expected Date:   04/07/2025    Expiration Date:   10/08/2025    Preferred imaging location?:   [facility]   CT CHEST ABDOMEN PELVIS WO CONTRAST    Standing Status:   Future    Expected Date:   04/07/2025    Expiration Date:   10/08/2025    Preferred imaging location?:   [facility]    If indicated for the ordered procedure, I authorize the administration of oral contrast media per Radiology protocol:   Yes    Does the patient have a contrast media/X-ray dye allergy?:   No   CBC    Standing Status:   Future    Expected Date:   04/07/2025    Expiration Date:   07/06/2025   CMP (Cancer Center only)    Standing Status:   Future    Expected Date:   04/07/2025    Expiration Date:   07/06/2025   CEA    Standing Status:   Future    Expected Date:   04/07/2025    Expiration Date:   07/06/2025   TSH    Standing Status:   Future    Expected Date:   04/07/2025    Expiration Date:   07/06/2025    Follow up in 6 months.   I discussed the assessment and treatment plan with the patient.  The patient was provided an opportunity to ask questions and all were answered.  The patient agreed with the plan and demonstrated an understanding of the instructions.  The patient was advised to call back if the symptoms worsen or if the condition fails to improve as anticipated.   Call Babara, MD, PhD [facility] Hematology Oncology 10/08/2024  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HCC)  10/11/2020 Cancer Staging   Staging form: Pharynx - HPV-Mediated Oropharynx, AJCC 8th Edition - Clinical stage from 10/11/2020: Stage I (cT1, cN1, cM0, p16+) - Signed by Babara Call, MD on 04/28/2022 Stage prefix: Initial diagnosis Stage used in treatment planning: Yes National guidelines used in treatment planning: Yes   10/17/2020 Initial Diagnosis   Carcinoma of base of tongue (HCC)   11/20/2020 - 12/27/2020 Chemotherapy   11/20/2020 -  12/27/2020  6 weeks of cisplatin  with concurrent radiation        05/23/2021 - 07/04/2021 Chemotherapy   Patient is on Treatment Plan : ESOPHAGUS Carboplatin /PACLitaxel  weekly x 6 weeks with XRT       Esophageal cancer, stage IB (HCC)  10/25/2020 Imaging   PET scan on 10/25/2020 revealed right tongue base primary with ipsilateral level 2 nodal metastasis. There were no findings of extracervical metastatic disease. There was focal hypermetabolism in the distal esophagus, with possible concurrent soft tissue density lesion. Recommended correlation with endoscopy. There was vague right lower lobe low-level hypermetabolism and ground-glass nodularity, favored minimal infection or aspiration. Incidental findings included aortic atherosclerosis, coronary artery atherosclerosis, emphysema, a tiny hiatal hernia, and prostatomegaly.   11/03/2020 Miscellaneous   11/03/2020 Upper endoscopy on  revealed a malignant esophageal tumor at the gastroesophageal junction. There were esophageal mucosal changes c/w short-segment Barrett's esophagus. There was a 2 cm hiatal hernia. There was gastritis.  Pathology in the esophagus at 36 cm revealed intramucosal adenocarcinoma arising in a background of high grade dysplasia and intestinal metaplasia; pathology at 38 cm revealed adenocarcinoma.   11/15/2020 EUS on  revealed early stage adenocarcinoma arising from Barrett's esophagus.  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      11/16/2020 Initial Diagnosis   Esophageal cancer, stage IB (HCC)   04/09/2021 Miscellaneous   04/09/2021, patient was seen by Dr. Medora and underwent EGD. Polypoid tumor was seen at the GE junction, consistent with the location of the previously visualized tumor.  With no significant difference compared to photos from other endoscopies.  The proximal esophagus is involved.  Esophagectomy will be applicable. Biopsy was taken at 38 cm.  Dr. Medora has  reached out radiation oncology Dr. Lenn and recommend neoadjuvant chemo and radiation.   05/23/2021 Concurrent Chemotherapy   05/23/2021-07/04/2021 weekly Carboplatin / taxol   + RT finished 07/09/2021   09/12/2021 Imaging    PET showed no signs of increased hypermetabolic activity in the neck to indicate residual head neck cancer.  No hypermetabolism in the esophagus. No signs of metastasis to the neck, chest, abdomen or pelvis.   10/29/2021 Miscellaneous   EGD showed no evidence of residual esophageal tumor today.  Esophageal mucosal changes consistent with short segment Barrett's esophagus.  Biopsied.  2 cm hiatal hernia.  A single gastric polyp, biopsied.  Pathology showed gastric hyperplastic polyps, Barrett's esophagus, low-grade dysplasia, focally suspicious for high-grade dysplasia.  Evidence of invasive carcinoma was not seen in the submitted biopsies   01/28/2022 Miscellaneous   EGD showed short segment Barrett's disease, distal esophagus at 37 cm biopsy showed low-grade dysplastic and focal high-grade dysplasia.   04/08/2022 Miscellaneous   EGD showed no gross lesions in esophagus approximately.  Esophageal mucosal changes secondary to established long segment Barrett's disease distantly.  Treated with radiofrequency ablation. 4 cm hiatal  hernia.  Erythematous mucosa in the stomach.  No gross lesions in the duodenal bulb, first portion of the duodenum and in the second portion of duodenum.   09/12/2022 Miscellaneous   Repeat EGD showed no suspicious lesions.  Patient underwent additional ablation.   12/17/2022 Imaging   PET scan showed 1. Interval resolution of the hypermetabolism seen in the distal esophagus on the previous study. Uptake in the distal esophagus today is below blood pool levels. 2. No evidence for hypermetabolic metastatic disease in the neck, chest, abdomen, or pelvis. 3. 4-5 mm posterior right middle lobe pulmonary nodule is new in the interval. Nonspecific on this  non breath hold exam, dedicated CT chest without contrast recommended for more definitive characterization. 4. Left colonic diverticulosis without diverticulitis. 5. Small right groin hernia contains only fat. 6.  Aortic Atherosclerosis    Imaging      Imaging   CT chest wo contrast  1. 4-5 mm posterior right middle lobe pulmonary nodule identified as new on the prior study has resolved in the interval. 2. Interval development of 3 new nodular ground-glass opacities in the left upper lobe measuring up to 1.6 x 0.7 cm. These are likely infectious/inflammatory. Non-contrast chest CT at 3-6 months is recommended. If the ground glass opacities persist, subsequent management will be based upon the most suspicious findings. This recommendation follows the consensus statement: Guidelines for Management of Incidental Pulmonary Nodules Detected on CT Images: From the Fleischner Society 2017; Radiology 2017; 281: 228-243. 3. Tiny hiatal hernia. 4.  Aortic Atherosclerosis   Esophageal adenocarcinoma (HCC)  04/21/2021 Initial Diagnosis   Esophageal adenocarcinoma (HCC)   05/23/2021 - 07/04/2021 Chemotherapy   Patient is on Treatment Plan : ESOPHAGUS Carboplatin /PACLitaxel  weekly x 6 weeks with XRT          INTERVAL HISTORY Kalonji Zurawski. is a 78 y.o. 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  He reports feeling well.  Appetite is fair. Weight is stable. Denies fever, sore throat or respiratory distress.  Denies any swallowing difficulty, nausea vomiting, epigastric pain.  October 2025, patient went to emergency room for evaluation of confusion.  Patient had a very high blood pressure with systolic in the 230s. CT head without contrast showed no acute intracranial abnormality. CT angiogram head and neck with and without contrast showed no large  vessel occlusion.  Patient has diagnosis of multiple vessels. Patient's BP regimen has been recently adjusted.-Currently on telmisartan 80 mg daily. He was supposed to have EUS which was rescheduled to January 2026.    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   Hypercholesterolemia    Hypertension     Past Surgical History:  Procedure Laterality Date   BIOPSY  01/05/2024   Procedure: BIOPSY;  Surgeon: Wilhelmenia Aloha Garner., MD;  Location: [facility];  Service: Gastroenterology;;   CATARACT EXTRACTION W/PHACO Left 12/03/2017   Procedure: CATARACT EXTRACTION PHACO AND  INTRAOCULAR LENS PLACEMENT (IOC) COMPLICATED DIABETIC LEFT;  Surgeon: Mittie Gaskin, MD;  Location: [facility];  Service: Ophthalmology;  Laterality: Left;  Diabetic - oral meds   CHOLECYSTECTOMY     COLONOSCOPY  2016   ESOPHAGOGASTRODUODENOSCOPY (EGD) WITH PROPOFOL  N/A 04/08/2022   Procedure: ESOPHAGOGASTRODUODENOSCOPY (EGD) WITH PROPOFOL ;  Surgeon: Wilhelmenia Aloha Raddle., MD;  Location: [facility];  Service: Gastroenterology;  Laterality: N/A;   ESOPHAGOGASTRODUODENOSCOPY (EGD) WITH PROPOFOL  N/A 09/12/2022   Procedure: ESOPHAGOGASTRODUODENOSCOPY (EGD) WITH PROPOFOL ;  Surgeon: Wilhelmenia Aloha Raddle., MD;  Location: [facility];  Service: Gastroenterology;  Laterality: N/A;   ESOPHAGOGASTRODUODENOSCOPY (EGD) WITH PROPOFOL  N/A 04/01/2023   Procedure: ESOPHAGOGASTRODUODENOSCOPY (EGD) WITH PROPOFOL ;  Surgeon: Wilhelmenia Aloha Raddle., MD;  Location: [facility];  Service: Gastroenterology;  Laterality: N/A;   ESOPHAGOGASTRODUODENOSCOPY (EGD) WITH PROPOFOL  N/A  01/05/2024   Procedure: ESOPHAGOGASTRODUODENOSCOPY (EGD) WITH PROPOFOL ;  Surgeon: Wilhelmenia Aloha Raddle., MD;  Location: [facility];  Service: Gastroenterology;  Laterality: N/A;   ESOPHAGOGASTRODUODENOSCOPY (EGD) WITH PROPOFOL  N/A 03/15/2024   Procedure: ESOPHAGOGASTRODUODENOSCOPY (EGD) WITH PROPOFOL ;  Surgeon: Wilhelmenia Aloha Raddle., MD;  Location: [facility];  Service: Gastroenterology;  Laterality: N/A;   GI RADIOFREQUENCY ABLATION  04/08/2022   Procedure: GI RADIOFREQUENCY ABLATION;  Surgeon: Wilhelmenia Aloha Raddle., MD;  Location: [facility];  Service: Gastroenterology;;   GI RADIOFREQUENCY ABLATION N/A 09/12/2022   Procedure: GI RADIOFREQUENCY ABLATION;  Surgeon: Wilhelmenia Aloha Raddle., MD;  Location: [facility];  Service: Gastroenterology;  Laterality: N/A;   GI RADIOFREQUENCY ABLATION  04/01/2023   Procedure: GI RADIOFREQUENCY ABLATION;  Surgeon: Wilhelmenia Aloha Raddle., MD;  Location: [facility];  Service: Gastroenterology;;   GI RADIOFREQUENCY ABLATION N/A 03/15/2024   Procedure: RADIOFREQUENCY ABLATION, UPPER GI TRACT, ENDOSCOPIC;  Surgeon: Wilhelmenia Aloha Raddle., MD;  Location: [facility];  Service: Gastroenterology;  Laterality: N/A;   PORTA CATH INSERTION N/A 11/13/2020   Procedure: PORTA CATH INSERTION;  Surgeon: Marea Selinda RAMAN, MD;  Location: [facility];  Service: Cardiovascular;  Laterality: N/A;    Family History  Problem Relation Age of Onset   Cancer Mother    Colon cancer Neg Hx    Colon polyps Neg Hx    Esophageal cancer Neg Hx    Rectal cancer Neg Hx    Stomach cancer Neg Hx    Inflammatory bowel disease Neg Hx    Liver disease Neg Hx    Pancreatic cancer Neg Hx     Social History:  reports that he quit smoking about 39 years ago. His smoking use included cigarettes. He has never used smokeless tobacco. He reports that he does not currently use alcohol. He reports that he does not use drugs. He quit smoking cold turkey in 1986. He smoked 2 packs  per day for 20 years. 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 He was exposed to [employer]. He worked with a architect for 34 years. The patient is accompanied by his wife today.  Allergies: No Known Allergies  Current Medications: Current Outpatient Medications  Medication Sig Dispense Refill   acetaminophen -codeine  120-12 MG/5ML solution Take 10 mLs by mouth every 6 (six) hours as needed for moderate pain (pain score 4-6). 120 mL 0   atenolol (TENORMIN) 25 MG tablet Take 25 mg by mouth daily.     betamethasone  valerate lotion (VALISONE ) 0.1 % Apply 1 Application topically as directed. qd up to 5 days a week to aa scalp, avoid face, groin, axilla 60 mL 6   brimonidine -timolol  (COMBIGAN ) 0.2-0.5 % ophthalmic solution  Place 1 drop into the left eye 2 (two) times daily.     Calcium Carb-Cholecalciferol (CALCIUM 600 + D PO) Take 1 tablet by mouth daily.     cholecalciferol (VITAMIN D3) 25 MCG (1000 UNIT) tablet Take 1,000 Units by mouth daily.     citalopram  (CELEXA ) 10 MG tablet TAKE 1 TABLET (10 MG TOTAL) BY MOUTH DAILY. STOP SERTRALINE  90 tablet 1   GI Cocktail (alum & mag hydroxide, lidocaine , dicyclomine ) oral mixture Take 30 mLs by mouth in the morning, at noon, in the evening, and at bedtime. Medication Name: GI cocktail  90 ml viscous lidocaine  90 ml 10 mg/5 ml dicyclomine  270 ml maalox  Swish and swallow 550 mL 3   glucose blood test strip      lidocaine -prilocaine  (EMLA ) cream Apply small amount to port and cover with saran wrap 1-2 hours prior to port access 30 g 3   loratadine (CLARITIN) 10 MG tablet Take 10 mg by mouth daily with lunch.     losartan (COZAAR) 50 MG tablet Take 50 mg by mouth.     Magnesium  400 MG TABS Take 400 mg by mouth daily.     metFORMIN (GLUCOPHAGE-XR) 500 MG 24 hr tablet Take 500 mg by mouth 2 (two) times daily.     Multiple Vitamin (MULTIVITAMIN) tablet Take 1 tablet by mouth daily.     pantoprazole  (PROTONIX ) 40 MG tablet TAKE 1  TABLET BY MOUTH TWICE A DAY BEFORE MEALS 180 tablet 2   Phenylephrine HCl (4-WAY FAST ACTING NA) Place 1 spray into the nose daily as needed (congestion).     potassium chloride  SA (KLOR-CON  M) 20 MEQ tablet Take 1 tablet (20 mEq total) by mouth daily. 3 tablet 0   simvastatin  (ZOCOR ) 40 MG tablet Take 40 mg by mouth daily at 6 PM.     sucralfate  (CARAFATE ) 1 g tablet Take 1 tablet (1 g total) by mouth 4 (four) times daily. Crush and dissolve in 15 mL of fluid 120 tablet 2   telmisartan (MICARDIS) 40 MG tablet Take 40 mg by mouth daily.     terbinafine  (LAMISIL ) 250 MG tablet Take 1 tablet (250 mg total) by mouth daily. 30 tablet 0   traZODone  (DESYREL ) 50 MG tablet Take 1 tablet (50 mg total) by mouth at bedtime as needed. for sleep 90 tablet 3   vitamin B-12 (CYANOCOBALAMIN) 1000 MCG tablet Take 1,000 mcg by mouth daily.     apixaban  (ELIQUIS ) 2.5 MG TABS tablet Take 1 tablet (2.5 mg total) by mouth 2 (two) times daily. 180 tablet 1   Current Facility-Administered Medications  Medication Dose Route Frequency Provider Last Rate Last Admin   0.9 %  sodium chloride  infusion  500 mL Intravenous Once Pyrtle, Gordy HERO, MD       Facility-Administered Medications Ordered in Other Visits  Medication Dose Route Frequency Provider Last Rate Last Admin   0.9 % NaCl with KCl 20 mEq/ L  infusion   Intravenous Once Corcoran, Melissa C, MD       heparin  lock flush 100 UNIT/ML injection            heparin  lock flush 100 unit/mL  500 Units Intravenous Once Rotunda Worden, MD       magnesium  sulfate IVPB 2 g 50 mL  2 g Intravenous Once Corcoran, Melissa C, MD       sodium chloride  flush (NS) 0.9 % injection 10 mL  10 mL Intravenous Once Babara Call, MD  Review of Systems  Constitutional: Negative.  Negative for chills, fever, malaise/fatigue and weight loss.  HENT:  Negative for congestion, ear pain and tinnitus.   Eyes: Negative.  Negative for blurred vision and double vision.  Respiratory: Negative.  Negative  for cough, sputum production and shortness of breath.   Cardiovascular:  Negative for chest pain and palpitations.  Gastrointestinal: Negative.  Negative for abdominal pain, constipation, diarrhea, nausea and vomiting.  Genitourinary:  Negative for dysuria, frequency and urgency.  Musculoskeletal:  Negative for back pain and falls.  Skin: Negative.  Negative for rash.  Neurological: Negative.  Negative for weakness and headaches.  Endo/Heme/Allergies: Negative.  Does not bruise/bleed easily.  Psychiatric/Behavioral: Negative.  Negative for depression. The patient is not nervous/anxious and does not have insomnia.    Performance status (ECOG): 1  Vitals Blood pressure (!) 158/78, pulse 66, temperature (!) 96 F (35.6 C), temperature source Tympanic, resp. rate 18, weight 197 lb 14.4 oz (89.8 kg), SpO2 99%.    Physical Exam Constitutional:      General: He is not in acute distress. HENT:     Head: Normocephalic.     Nose: Nose normal.     Mouth/Throat:     Pharynx: No oropharyngeal exudate.  Eyes:     General: No scleral icterus.    Pupils: Pupils are equal, round, and reactive to light.  Cardiovascular:     Rate and Rhythm: Normal rate.     Heart sounds: No murmur heard. Pulmonary:     Effort: Pulmonary effort is normal. No respiratory distress.     Breath sounds: No wheezing.  Abdominal:     General: Bowel sounds are normal. There is no distension.     Palpations: Abdomen is soft.  Musculoskeletal:        General: Normal range of motion.     Cervical back: Normal range of motion.  Skin:    General: Skin is warm and dry.     Findings: No erythema.  Neurological:     Mental Status: He is alert and oriented to person, place, and time. Mental status is at baseline.     Cranial Nerves: No cranial nerve deficit.     Motor: No abnormal muscle tone.  Psychiatric:        Mood and Affect: Mood and affect normal.     Labs    Latest Ref Rng & Units 10/08/2024   11:20 AM  08/19/2024    6:23 PM 08/19/2024    6:21 PM  CBC  WBC 4.0 - 10.5 K/uL 5.7   7.7   Hemoglobin 13.0 - 17.0 g/dL 85.8  84.3  84.3   Hematocrit 39.0 - 52.0 % 40.1  46.0  44.7   Platelets 150 - 400 K/uL 184   198       Latest Ref Rng & Units 10/08/2024   11:20 AM 08/19/2024    6:23 PM 08/19/2024    6:21 PM  CMP  Glucose 70 - 99 mg/dL 713  794  797   BUN 8 - 23 mg/dL 17  13  12    Creatinine 0.61 - 1.24 mg/dL 8.96  9.19  9.13   Sodium 135 - 145 mmol/L 130  139  138   Potassium 3.5 - 5.1 mmol/L 4.0  3.6  3.7   Chloride 98 - 111 mmol/L 98  102  101   CO2 22 - 32 mmol/L 25   23   Calcium 8.9 - 10.3 mg/dL 8.6  9.3   Total Protein 6.5 - 8.1 g/dL 6.8   6.8   Total Bilirubin 0.0 - 1.2 mg/dL 0.8   0.7   Alkaline Phos 38 - 126 U/L 51   56   AST 15 - 41 U/L 23   24   ALT 0 - 44 U/L 24   23

## 2024-10-08 NOTE — Telephone Encounter (Signed)
 Called pt to sched CT - no answer - called pt spouse to check that pt number is correct - she confirmed pt phone number - sched pt CT w/pt spouse - pt spouse confirmed date/time/location - pt spouse requested appt reminder via mail - LH

## 2024-10-08 NOTE — Assessment & Plan Note (Addendum)
 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 completed concurrent radiation and cisplatin   CT neck showed non specific thickening of epiglottis.he has no clinical signs of epiglottitis. -observation.  Recommend patient to follow-up  with ENT  Will monitor thyroid  function.  TSH is slightly elevated.  Check T4

## 2024-10-08 NOTE — Assessment & Plan Note (Signed)
 Discussed about option of Mediport removal.  Patient would like to continue keep maintaining his port.  Continue port flush every 6- 8 weeks.

## 2024-10-08 NOTE — Assessment & Plan Note (Addendum)
 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  10/29/2021, EGD showed no invasive esophagus carcinoma.  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 02/2024 S/p radiofrequency ablation Continue follow up with GI and EGD surveillance and ablation if needed.  CT images were reviewed and discussed with patient. - no recurrence.  Repeat CT in 6 months.

## 2024-10-08 NOTE — Assessment & Plan Note (Signed)
Bilateral pulmonary emboli and RLE DVT - 01/2021 Continue Eliquis to 2.5 mg twice daily for prophylaxis.  

## 2024-10-09 LAB — CEA: CEA: 1.2 ng/mL (ref 0.0–4.7)

## 2024-10-11 ENCOUNTER — Ambulatory Visit: Admitting: Radiation Oncology

## 2024-10-11 ENCOUNTER — Other Ambulatory Visit: Payer: Self-pay

## 2024-10-11 DIAGNOSIS — C159 Malignant neoplasm of esophagus, unspecified: Secondary | ICD-10-CM

## 2024-10-11 NOTE — Telephone Encounter (Signed)
 I checked with lab to see if T4 or free T4 can be added. Lab stated that this could not be added because blood sample is to old. Rosina can you please arrange patient to get lab only done. I have added T4 lab order in.

## 2024-10-11 NOTE — Telephone Encounter (Signed)
-----   Message from Zelphia Cap sent at 10/08/2024  9:58 PM EST ----- Please check if T4 or free T4 can be added.  If not, please arrange patient to get T4 done.  Thanks ----- Message ----- From: Rebecka, Lab In [place] Sent: 10/08/2024  11:35 AM EST To: Zelphia Cap, MD

## 2024-10-20 ENCOUNTER — Telehealth: Payer: Self-pay | Admitting: Radiation Oncology

## 2024-10-20 NOTE — Telephone Encounter (Signed)
 Pt called and requested to r/s his lab - pt is still out of town and doesn't have a ride to get back - r/s lab w/pt - pt confirmed date/time - LH

## 2024-10-21 ENCOUNTER — Inpatient Hospital Stay

## 2024-10-21 ENCOUNTER — Ambulatory Visit: Payer: Medicare Other | Admitting: Dermatology

## 2024-10-25 ENCOUNTER — Ambulatory Visit: Admitting: Dermatology

## 2024-10-26 ENCOUNTER — Inpatient Hospital Stay: Attending: Oncology

## 2024-10-26 DIAGNOSIS — C159 Malignant neoplasm of esophagus, unspecified: Secondary | ICD-10-CM | POA: Diagnosis present

## 2024-10-26 DIAGNOSIS — C01 Malignant neoplasm of base of tongue: Secondary | ICD-10-CM | POA: Insufficient documentation

## 2024-10-26 DIAGNOSIS — Z79620 Long term (current) use of immunosuppressive biologic: Secondary | ICD-10-CM | POA: Insufficient documentation

## 2024-10-27 LAB — T4: T4, Total: 8.2 ug/dL (ref 4.5–12.0)

## 2024-10-29 ENCOUNTER — Ambulatory Visit

## 2024-11-04 ENCOUNTER — Ambulatory Visit: Admitting: Radiation Oncology

## 2024-11-08 ENCOUNTER — Telehealth: Payer: Self-pay | Admitting: Gastroenterology

## 2024-11-08 NOTE — Telephone Encounter (Signed)
 The pt states that he wants to cancel his procedure for now and he will call back to reschedule when his BP is better under control.

## 2024-11-08 NOTE — Telephone Encounter (Signed)
 Inbound call from patient stating he is going to have to reschedule procedure in the hospital on 11/25/24 due to pcp stating he should wait until his blood pressure is better. Patient requesting call back from nurse to reschedule   Please advise  Than you

## 2024-11-25 ENCOUNTER — Encounter (HOSPITAL_COMMUNITY): Admission: RE | Payer: Self-pay | Source: Home / Self Care

## 2024-11-25 ENCOUNTER — Ambulatory Visit (HOSPITAL_COMMUNITY): Admission: RE | Admit: 2024-11-25 | Source: Home / Self Care | Admitting: Gastroenterology

## 2024-11-25 SURGERY — EGD (ESOPHAGOGASTRODUODENOSCOPY)
Anesthesia: Monitor Anesthesia Care

## 2024-12-03 ENCOUNTER — Inpatient Hospital Stay: Attending: Oncology

## 2025-01-20 ENCOUNTER — Ambulatory Visit: Admitting: Psychiatry

## 2025-01-28 ENCOUNTER — Inpatient Hospital Stay: Attending: Oncology

## 2025-04-06 ENCOUNTER — Ambulatory Visit

## 2025-04-07 ENCOUNTER — Inpatient Hospital Stay

## 2025-04-07 ENCOUNTER — Inpatient Hospital Stay: Admitting: Oncology

## 2025-04-15 ENCOUNTER — Inpatient Hospital Stay

## 2025-04-15 ENCOUNTER — Inpatient Hospital Stay: Admitting: Oncology
